# Patient Record
Sex: Male | Born: 1935 | Race: White | Hispanic: No | Marital: Single | Smoking: Former smoker
Health system: Southern US, Community
[De-identification: ages and names within clinical notes are randomized; demographics above are authoritative.]

## PROBLEM LIST (undated history)

## (undated) DIAGNOSIS — T4145XA Adverse effect of unspecified anesthetic, initial encounter: Secondary | ICD-10-CM

## (undated) DIAGNOSIS — K859 Acute pancreatitis without necrosis or infection, unspecified: Secondary | ICD-10-CM

## (undated) DIAGNOSIS — G629 Polyneuropathy, unspecified: Secondary | ICD-10-CM

## (undated) DIAGNOSIS — I10 Essential (primary) hypertension: Secondary | ICD-10-CM

## (undated) DIAGNOSIS — I714 Abdominal aortic aneurysm, without rupture, unspecified: Secondary | ICD-10-CM

## (undated) DIAGNOSIS — M1990 Unspecified osteoarthritis, unspecified site: Secondary | ICD-10-CM

## (undated) DIAGNOSIS — N289 Disorder of kidney and ureter, unspecified: Secondary | ICD-10-CM

## (undated) DIAGNOSIS — E039 Hypothyroidism, unspecified: Secondary | ICD-10-CM

## (undated) DIAGNOSIS — N400 Enlarged prostate without lower urinary tract symptoms: Secondary | ICD-10-CM

## (undated) DIAGNOSIS — R7302 Impaired glucose tolerance (oral): Secondary | ICD-10-CM

## (undated) DIAGNOSIS — T8859XA Other complications of anesthesia, initial encounter: Secondary | ICD-10-CM

## (undated) HISTORY — DX: Impaired glucose tolerance (oral): R73.02

## (undated) HISTORY — DX: Hypothyroidism, unspecified: E03.9

## (undated) HISTORY — DX: Disorder of kidney and ureter, unspecified: N28.9

## (undated) HISTORY — DX: Benign prostatic hyperplasia without lower urinary tract symptoms: N40.0

## (undated) HISTORY — DX: Abdominal aortic aneurysm, without rupture: I71.4

## (undated) HISTORY — PX: PROSTATE SURGERY: SHX751

## (undated) HISTORY — DX: Abdominal aortic aneurysm, without rupture, unspecified: I71.40

---

## 2003-01-28 ENCOUNTER — Encounter: Admission: RE | Admit: 2003-01-28 | Discharge: 2003-04-28 | Payer: Self-pay | Admitting: Podiatry

## 2003-04-24 ENCOUNTER — Emergency Department (HOSPITAL_COMMUNITY): Admission: EM | Admit: 2003-04-24 | Discharge: 2003-04-24 | Payer: Self-pay | Admitting: Emergency Medicine

## 2003-04-24 ENCOUNTER — Encounter: Payer: Self-pay | Admitting: Emergency Medicine

## 2007-03-27 ENCOUNTER — Emergency Department (HOSPITAL_COMMUNITY): Admission: EM | Admit: 2007-03-27 | Discharge: 2007-03-27 | Payer: Self-pay | Admitting: Emergency Medicine

## 2007-04-17 ENCOUNTER — Inpatient Hospital Stay (HOSPITAL_COMMUNITY): Admission: EM | Admit: 2007-04-17 | Discharge: 2007-04-20 | Payer: Self-pay | Admitting: Emergency Medicine

## 2007-06-28 ENCOUNTER — Ambulatory Visit: Payer: Self-pay | Admitting: *Deleted

## 2007-07-12 ENCOUNTER — Encounter: Admission: RE | Admit: 2007-07-12 | Discharge: 2007-07-12 | Payer: Self-pay | Admitting: Gastroenterology

## 2007-08-26 ENCOUNTER — Emergency Department (HOSPITAL_COMMUNITY): Admission: EM | Admit: 2007-08-26 | Discharge: 2007-08-26 | Payer: Self-pay | Admitting: Emergency Medicine

## 2007-09-01 ENCOUNTER — Emergency Department (HOSPITAL_COMMUNITY): Admission: EM | Admit: 2007-09-01 | Discharge: 2007-09-01 | Payer: Self-pay | Admitting: Emergency Medicine

## 2007-09-28 ENCOUNTER — Ambulatory Visit: Payer: Self-pay | Admitting: Family Medicine

## 2007-10-15 ENCOUNTER — Ambulatory Visit: Payer: Self-pay | Admitting: Family Medicine

## 2007-12-13 ENCOUNTER — Ambulatory Visit: Payer: Self-pay | Admitting: Family Medicine

## 2007-12-14 ENCOUNTER — Encounter: Admission: RE | Admit: 2007-12-14 | Discharge: 2007-12-14 | Payer: Self-pay | Admitting: Family Medicine

## 2008-01-11 ENCOUNTER — Ambulatory Visit: Payer: Self-pay | Admitting: Family Medicine

## 2008-02-21 ENCOUNTER — Ambulatory Visit: Payer: Self-pay | Admitting: Family Medicine

## 2008-02-28 ENCOUNTER — Ambulatory Visit: Payer: Self-pay | Admitting: *Deleted

## 2008-03-20 ENCOUNTER — Ambulatory Visit: Payer: Self-pay | Admitting: Family Medicine

## 2008-05-07 ENCOUNTER — Ambulatory Visit: Payer: Self-pay | Admitting: Family Medicine

## 2008-06-05 ENCOUNTER — Emergency Department (HOSPITAL_COMMUNITY): Admission: EM | Admit: 2008-06-05 | Discharge: 2008-06-05 | Payer: Self-pay | Admitting: Emergency Medicine

## 2008-08-04 ENCOUNTER — Ambulatory Visit: Payer: Self-pay | Admitting: Family Medicine

## 2008-08-05 ENCOUNTER — Ambulatory Visit: Payer: Self-pay | Admitting: *Deleted

## 2008-08-05 ENCOUNTER — Encounter: Admission: RE | Admit: 2008-08-05 | Discharge: 2008-08-05 | Payer: Self-pay | Admitting: Family Medicine

## 2008-08-07 ENCOUNTER — Ambulatory Visit: Payer: Self-pay | Admitting: *Deleted

## 2008-10-03 ENCOUNTER — Ambulatory Visit: Payer: Self-pay | Admitting: Family Medicine

## 2008-10-06 ENCOUNTER — Ambulatory Visit: Payer: Self-pay | Admitting: Family Medicine

## 2008-10-10 ENCOUNTER — Ambulatory Visit: Payer: Self-pay | Admitting: Family Medicine

## 2009-02-05 ENCOUNTER — Encounter: Admission: RE | Admit: 2009-02-05 | Discharge: 2009-02-05 | Payer: Self-pay | Admitting: *Deleted

## 2009-03-12 ENCOUNTER — Ambulatory Visit: Payer: Self-pay | Admitting: *Deleted

## 2009-03-26 ENCOUNTER — Ambulatory Visit: Payer: Self-pay | Admitting: *Deleted

## 2009-08-03 ENCOUNTER — Ambulatory Visit: Payer: Self-pay | Admitting: Surgery

## 2009-10-07 ENCOUNTER — Ambulatory Visit: Payer: Self-pay | Admitting: Family Medicine

## 2009-10-22 ENCOUNTER — Ambulatory Visit: Payer: Self-pay | Admitting: Family Medicine

## 2009-10-27 ENCOUNTER — Ambulatory Visit: Payer: Self-pay | Admitting: Family Medicine

## 2009-10-30 ENCOUNTER — Ambulatory Visit (HOSPITAL_COMMUNITY): Admission: RE | Admit: 2009-10-30 | Discharge: 2009-10-30 | Payer: Self-pay | Admitting: Family Medicine

## 2009-11-06 ENCOUNTER — Ambulatory Visit: Payer: Self-pay | Admitting: Family Medicine

## 2010-03-24 ENCOUNTER — Emergency Department (HOSPITAL_COMMUNITY): Admission: EM | Admit: 2010-03-24 | Discharge: 2010-03-24 | Payer: Self-pay | Admitting: Emergency Medicine

## 2010-03-24 ENCOUNTER — Ambulatory Visit: Payer: Self-pay | Admitting: Vascular Surgery

## 2010-04-26 ENCOUNTER — Ambulatory Visit: Payer: Self-pay | Admitting: Family Medicine

## 2010-05-10 ENCOUNTER — Ambulatory Visit: Payer: Self-pay | Admitting: Surgery

## 2010-05-10 ENCOUNTER — Ambulatory Visit: Payer: Self-pay | Admitting: Family Medicine

## 2010-05-19 ENCOUNTER — Ambulatory Visit: Payer: Self-pay | Admitting: Surgery

## 2010-05-19 ENCOUNTER — Encounter: Payer: Self-pay | Admitting: Surgery

## 2010-05-19 ENCOUNTER — Ambulatory Visit: Payer: Self-pay | Admitting: Pulmonary Disease

## 2010-05-19 ENCOUNTER — Inpatient Hospital Stay (HOSPITAL_COMMUNITY): Admission: RE | Admit: 2010-05-19 | Discharge: 2010-06-09 | Payer: Self-pay | Admitting: Surgery

## 2010-05-19 ENCOUNTER — Ambulatory Visit: Payer: Self-pay | Admitting: Cardiology

## 2010-05-21 HISTORY — PX: ABDOMINAL AORTIC ANEURYSM REPAIR: SUR1152

## 2010-05-25 ENCOUNTER — Encounter: Payer: Self-pay | Admitting: Pulmonary Disease

## 2010-06-09 ENCOUNTER — Encounter: Payer: Self-pay | Admitting: Surgery

## 2010-06-24 ENCOUNTER — Ambulatory Visit: Payer: Self-pay | Admitting: Family Medicine

## 2010-06-25 ENCOUNTER — Ambulatory Visit: Payer: Self-pay | Admitting: Family Medicine

## 2010-06-28 ENCOUNTER — Ambulatory Visit: Payer: Self-pay | Admitting: Surgery

## 2010-09-27 ENCOUNTER — Ambulatory Visit: Payer: Self-pay | Admitting: Surgery

## 2010-10-07 ENCOUNTER — Ambulatory Visit: Payer: Self-pay | Admitting: Family Medicine

## 2010-12-12 ENCOUNTER — Encounter: Payer: Self-pay | Admitting: Internal Medicine

## 2011-02-05 LAB — BASIC METABOLIC PANEL
BUN: 19 mg/dL (ref 6–23)
BUN: 25 mg/dL — ABNORMAL HIGH (ref 6–23)
BUN: 25 mg/dL — ABNORMAL HIGH (ref 6–23)
Calcium: 7.2 mg/dL — ABNORMAL LOW (ref 8.4–10.5)
Calcium: 7.3 mg/dL — ABNORMAL LOW (ref 8.4–10.5)
Creatinine, Ser: 1.61 mg/dL — ABNORMAL HIGH (ref 0.4–1.5)
GFR calc non Af Amer: 37 mL/min — ABNORMAL LOW (ref >60)
GFR calc non Af Amer: 38 mL/min — ABNORMAL LOW (ref >60)
GFR calc non Af Amer: 42 mL/min — ABNORMAL LOW (ref >60)
Glucose, Bld: 105 mg/dL — ABNORMAL HIGH (ref 70–99)
Glucose, Bld: 135 mg/dL — ABNORMAL HIGH (ref 70–99)
Potassium: 3.7 mEq/L (ref 3.5–5.1)
Potassium: 4.6 mEq/L (ref 3.5–5.1)

## 2011-02-05 LAB — GLUCOSE, CAPILLARY
Glucose-Capillary: 108 mg/dL — ABNORMAL HIGH (ref 70–99)
Glucose-Capillary: 118 mg/dL — ABNORMAL HIGH (ref 70–99)
Glucose-Capillary: 126 mg/dL — ABNORMAL HIGH (ref 70–99)
Glucose-Capillary: 134 mg/dL — ABNORMAL HIGH (ref 70–99)
Glucose-Capillary: 148 mg/dL — ABNORMAL HIGH (ref 70–99)
Glucose-Capillary: 174 mg/dL — ABNORMAL HIGH (ref 70–99)
Glucose-Capillary: 180 mg/dL — ABNORMAL HIGH (ref 70–99)
Glucose-Capillary: 223 mg/dL — ABNORMAL HIGH (ref 70–99)
Glucose-Capillary: 235 mg/dL — ABNORMAL HIGH (ref 70–99)
Glucose-Capillary: 262 mg/dL — ABNORMAL HIGH (ref 70–99)

## 2011-02-05 LAB — CBC
HCT: 30.7 % — ABNORMAL LOW (ref 39.0–52.0)
Hemoglobin: 10.1 g/dL — ABNORMAL LOW (ref 13.0–17.0)
MCH: 31.2 pg (ref 26.0–34.0)
MCHC: 33.1 g/dL (ref 30.0–36.0)
RDW: 15.5 % (ref 11.5–15.5)
WBC: 7.5 10*3/uL (ref 4.0–10.5)

## 2011-02-05 LAB — PHOSPHORUS: Phosphorus: 3.5 mg/dL (ref 2.3–4.6)

## 2011-02-05 LAB — HEMOGLOBIN A1C: Hgb A1c MFr Bld: 6.3 % — ABNORMAL HIGH (ref <5.7)

## 2011-02-05 LAB — CARDIAC PANEL(CRET KIN+CKTOT+MB+TROPI)
CK, MB: 2.1 ng/mL (ref 0.3–4.0)
CK, MB: 2.3 ng/mL (ref 0.3–4.0)
Troponin I: 0.02 ng/mL (ref 0.00–0.06)

## 2011-02-06 LAB — POCT I-STAT 3, ART BLOOD GAS (G3+)
Acid-base deficit: 5 mmol/L — ABNORMAL HIGH (ref 0.0–2.0)
Acid-base deficit: 6 mmol/L — ABNORMAL HIGH (ref 0.0–2.0)
Bicarbonate: 16.8 mEq/L — ABNORMAL LOW (ref 20.0–24.0)
Bicarbonate: 17.8 mEq/L — ABNORMAL LOW (ref 20.0–24.0)
Bicarbonate: 18.9 mEq/L — ABNORMAL LOW (ref 20.0–24.0)
Bicarbonate: 20.9 mEq/L (ref 20.0–24.0)
O2 Saturation: 93 %
O2 Saturation: 94 %
O2 Saturation: 96 %
Patient temperature: 97.5
Patient temperature: 98.2
Patient temperature: 98.6
TCO2: 19 mmol/L (ref 0–100)
TCO2: 20 mmol/L (ref 0–100)
TCO2: 22 mmol/L (ref 0–100)
pCO2 arterial: 34.4 mmHg — ABNORMAL LOW (ref 35.0–45.0)
pCO2 arterial: 39.6 mmHg (ref 35.0–45.0)
pCO2 arterial: 41.4 mmHg (ref 35.0–45.0)
pCO2 arterial: 44.2 mmHg (ref 35.0–45.0)
pH, Arterial: 7.241 — ABNORMAL LOW (ref 7.350–7.450)
pH, Arterial: 7.286 — ABNORMAL LOW (ref 7.350–7.450)
pH, Arterial: 7.316 — ABNORMAL LOW (ref 7.350–7.450)
pH, Arterial: 7.367 (ref 7.350–7.450)
pO2, Arterial: 126 mmHg — ABNORMAL HIGH (ref 80.0–100.0)
pO2, Arterial: 65 mmHg — ABNORMAL LOW (ref 80.0–100.0)
pO2, Arterial: 73 mmHg — ABNORMAL LOW (ref 80.0–100.0)

## 2011-02-06 LAB — COMPREHENSIVE METABOLIC PANEL
ALT: 17 U/L (ref 0–53)
ALT: 18 U/L (ref 0–53)
ALT: 22 U/L (ref 0–53)
ALT: 26 U/L (ref 0–53)
AST: 14 U/L (ref 0–37)
AST: 15 U/L (ref 0–37)
AST: 17 U/L (ref 0–37)
AST: 24 U/L (ref 0–37)
AST: 25 U/L (ref 0–37)
AST: 15 U/L (ref 0–37)
Albumin: 1.9 g/dL — ABNORMAL LOW (ref 3.5–5.2)
Albumin: 2.2 g/dL — ABNORMAL LOW (ref 3.5–5.2)
Albumin: 2.2 g/dL — ABNORMAL LOW (ref 3.5–5.2)
Albumin: 3.3 g/dL — ABNORMAL LOW (ref 3.5–5.2)
Alkaline Phosphatase: 51 U/L (ref 39–117)
Alkaline Phosphatase: 52 U/L (ref 39–117)
Alkaline Phosphatase: 58 U/L (ref 39–117)
Alkaline Phosphatase: 63 U/L (ref 39–117)
BUN: 21 mg/dL (ref 6–23)
BUN: 29 mg/dL — ABNORMAL HIGH (ref 6–23)
CO2: 18 mEq/L — ABNORMAL LOW (ref 19–32)
CO2: 39 mEq/L — ABNORMAL HIGH (ref 19–32)
Calcium: 7 mg/dL — ABNORMAL LOW (ref 8.4–10.5)
Calcium: 7 mg/dL — ABNORMAL LOW (ref 8.4–10.5)
Calcium: 7.2 mg/dL — ABNORMAL LOW (ref 8.4–10.5)
Calcium: 7.3 mg/dL — ABNORMAL LOW (ref 8.4–10.5)
Calcium: 8.3 mg/dL — ABNORMAL LOW (ref 8.4–10.5)
Chloride: 104 mEq/L (ref 96–112)
Chloride: 113 mEq/L — ABNORMAL HIGH (ref 96–112)
Creatinine, Ser: 1.52 mg/dL — ABNORMAL HIGH (ref 0.4–1.5)
Creatinine, Ser: 1.52 mg/dL — ABNORMAL HIGH (ref 0.4–1.5)
Creatinine, Ser: 1.87 mg/dL — ABNORMAL HIGH (ref 0.4–1.5)
Creatinine, Ser: 1.56 mg/dL — ABNORMAL HIGH (ref 0.4–1.5)
GFR calc Af Amer: 43 mL/min — ABNORMAL LOW (ref >60)
GFR calc Af Amer: 43 mL/min — ABNORMAL LOW (ref >60)
GFR calc Af Amer: 54 mL/min — ABNORMAL LOW (ref >60)
GFR calc Af Amer: 54 mL/min — ABNORMAL LOW (ref >60)
GFR calc Af Amer: 60 mL/min — ABNORMAL LOW (ref >60)
GFR calc Af Amer: 52 mL/min — ABNORMAL LOW (ref >60)
GFR calc non Af Amer: 36 mL/min — ABNORMAL LOW (ref >60)
GFR calc non Af Amer: 45 mL/min — ABNORMAL LOW (ref >60)
Glucose, Bld: 142 mg/dL — ABNORMAL HIGH (ref 70–99)
Potassium: 3 mEq/L — ABNORMAL LOW (ref 3.5–5.1)
Potassium: 3.8 mEq/L (ref 3.5–5.1)
Potassium: 3.8 mEq/L (ref 3.5–5.1)
Sodium: 142 mEq/L (ref 135–145)
Sodium: 144 mEq/L (ref 135–145)
Sodium: 145 mEq/L (ref 135–145)
Sodium: 149 mEq/L — ABNORMAL HIGH (ref 135–145)
Total Bilirubin: 0.5 mg/dL (ref 0.3–1.2)
Total Bilirubin: 1 mg/dL (ref 0.3–1.2)
Total Protein: 4.7 g/dL — ABNORMAL LOW (ref 6.0–8.3)
Total Protein: 4.7 g/dL — ABNORMAL LOW (ref 6.0–8.3)
Total Protein: 4.7 g/dL — ABNORMAL LOW (ref 6.0–8.3)
Total Protein: 4.9 g/dL — ABNORMAL LOW (ref 6.0–8.3)
Total Protein: 5.1 g/dL — ABNORMAL LOW (ref 6.0–8.3)

## 2011-02-06 LAB — POCT I-STAT 7, (LYTES, BLD GAS, ICA,H+H)
Acid-base deficit: 10 mmol/L — ABNORMAL HIGH (ref 0.0–2.0)
Acid-base deficit: 5 mmol/L — ABNORMAL HIGH (ref 0.0–2.0)
Calcium, Ion: 0.95 mmol/L — ABNORMAL LOW (ref 1.12–1.32)
Calcium, Ion: 1.04 mmol/L — ABNORMAL LOW (ref 1.12–1.32)
HCT: 26 % — ABNORMAL LOW (ref 39.0–52.0)
HCT: 34 % — ABNORMAL LOW (ref 39.0–52.0)
Hemoglobin: 11.6 g/dL — ABNORMAL LOW (ref 13.0–17.0)
Hemoglobin: 8.8 g/dL — ABNORMAL LOW (ref 13.0–17.0)
O2 Saturation: 100 %
O2 Saturation: 100 %
Patient temperature: 34.8
Patient temperature: 34.9
Patient temperature: 35.1
Potassium: 4.1 mEq/L (ref 3.5–5.1)
Potassium: 4.1 mEq/L (ref 3.5–5.1)
Potassium: 4.1 mEq/L (ref 3.5–5.1)
Sodium: 138 mEq/L (ref 135–145)
TCO2: 19 mmol/L (ref 0–100)
TCO2: 19 mmol/L (ref 0–100)
TCO2: 23 mmol/L (ref 0–100)
pCO2 arterial: 35.8 mmHg (ref 35.0–45.0)
pCO2 arterial: 39 mmHg (ref 35.0–45.0)
pCO2 arterial: 40.8 mmHg (ref 35.0–45.0)
pH, Arterial: 7.246 — ABNORMAL LOW (ref 7.350–7.450)
pO2, Arterial: 443 mmHg — ABNORMAL HIGH (ref 80.0–100.0)
pO2, Arterial: 449 mmHg — ABNORMAL HIGH (ref 80.0–100.0)

## 2011-02-06 LAB — BLOOD GAS, ARTERIAL
Acid-base deficit: 8.4 mmol/L — ABNORMAL HIGH (ref 0.0–2.0)
Bicarbonate: 20.1 mEq/L (ref 20.0–24.0)
Bicarbonate: 17.6 mEq/L — ABNORMAL LOW (ref 20.0–24.0)
Drawn by: 181601
MECHVT: 550 mL
O2 Saturation: 99.6 %
O2 Saturation: 98.3 %
PEEP: 5 cmH2O
Patient temperature: 98.6
Patient temperature: 98.7
Patient temperature: 98.6
TCO2: 18.8 mmol/L (ref 0–100)
pCO2 arterial: 40.4 mmHg (ref 35.0–45.0)
pH, Arterial: 7.37 (ref 7.350–7.450)
pH, Arterial: 7.392 (ref 7.350–7.450)
pO2, Arterial: 66.5 mmHg — ABNORMAL LOW (ref 80.0–100.0)
pO2, Arterial: 112 mmHg — ABNORMAL HIGH (ref 80.0–100.0)

## 2011-02-06 LAB — POCT I-STAT 4, (NA,K, GLUC, HGB,HCT)
Glucose, Bld: 98 mg/dL (ref 70–99)
HCT: 36 % — ABNORMAL LOW (ref 39.0–52.0)
Potassium: 2.7 mEq/L — CL (ref 3.5–5.1)
Sodium: 150 mEq/L — ABNORMAL HIGH (ref 135–145)

## 2011-02-06 LAB — CBC
HCT: 29.4 % — ABNORMAL LOW (ref 39.0–52.0)
HCT: 30.7 % — ABNORMAL LOW (ref 39.0–52.0)
HCT: 32.7 % — ABNORMAL LOW (ref 39.0–52.0)
HCT: 33.6 % — ABNORMAL LOW (ref 39.0–52.0)
HCT: 34 % — ABNORMAL LOW (ref 39.0–52.0)
HCT: 34.8 % — ABNORMAL LOW (ref 39.0–52.0)
HCT: 35.5 % — ABNORMAL LOW (ref 39.0–52.0)
HCT: 32.7 % — ABNORMAL LOW (ref 39.0–52.0)
Hemoglobin: 10.5 g/dL — ABNORMAL LOW (ref 13.0–17.0)
Hemoglobin: 10.6 g/dL — ABNORMAL LOW (ref 13.0–17.0)
Hemoglobin: 10.7 g/dL — ABNORMAL LOW (ref 13.0–17.0)
Hemoglobin: 11.3 g/dL — ABNORMAL LOW (ref 13.0–17.0)
Hemoglobin: 11.4 g/dL — ABNORMAL LOW (ref 13.0–17.0)
Hemoglobin: 11.4 g/dL — ABNORMAL LOW (ref 13.0–17.0)
Hemoglobin: 12 g/dL — ABNORMAL LOW (ref 13.0–17.0)
Hemoglobin: 11.3 g/dL — ABNORMAL LOW (ref 13.0–17.0)
MCH: 31.1 pg (ref 26.0–34.0)
MCH: 31.2 pg (ref 26.0–34.0)
MCH: 31.3 pg (ref 26.0–34.0)
MCH: 31.7 pg (ref 26.0–34.0)
MCH: 31.7 pg (ref 26.0–34.0)
MCH: 32.1 pg (ref 26.0–34.0)
MCH: 32.6 pg (ref 26.0–34.0)
MCH: 32.9 pg (ref 26.0–34.0)
MCHC: 32.6 g/dL (ref 30.0–36.0)
MCHC: 32.7 g/dL (ref 30.0–36.0)
MCHC: 32.8 g/dL (ref 30.0–36.0)
MCHC: 33 g/dL (ref 30.0–36.0)
MCHC: 33.2 g/dL (ref 30.0–36.0)
MCHC: 33.3 g/dL (ref 30.0–36.0)
MCHC: 33.4 g/dL (ref 30.0–36.0)
MCHC: 33.7 g/dL (ref 30.0–36.0)
MCHC: 34 g/dL (ref 30.0–36.0)
MCHC: 33.9 g/dL (ref 30.0–36.0)
MCHC: 34.4 g/dL (ref 30.0–36.0)
MCHC: 34.6 g/dL (ref 30.0–36.0)
MCV: 93.4 fL (ref 78.0–100.0)
MCV: 94.6 fL (ref 78.0–100.0)
MCV: 94.8 fL (ref 78.0–100.0)
MCV: 94.8 fL (ref 78.0–100.0)
MCV: 94.9 fL (ref 78.0–100.0)
MCV: 95.1 fL (ref 78.0–100.0)
MCV: 94.2 fL (ref 78.0–100.0)
Platelets: 189 10*3/uL (ref 150–400)
Platelets: 266 10*3/uL (ref 150–400)
Platelets: 325 10*3/uL (ref 150–400)
Platelets: 400 10*3/uL (ref 150–400)
Platelets: 121 10*3/uL — ABNORMAL LOW (ref 150–400)
Platelets: 191 10*3/uL (ref 150–400)
RBC: 3.09 MIL/uL — ABNORMAL LOW (ref 4.22–5.81)
RBC: 3.49 MIL/uL — ABNORMAL LOW (ref 4.22–5.81)
RBC: 3.58 MIL/uL — ABNORMAL LOW (ref 4.22–5.81)
RBC: 3.64 MIL/uL — ABNORMAL LOW (ref 4.22–5.81)
RBC: 3.47 MIL/uL — ABNORMAL LOW (ref 4.22–5.81)
RDW: 14.8 % (ref 11.5–15.5)
RDW: 15 % (ref 11.5–15.5)
RDW: 15.1 % (ref 11.5–15.5)
RDW: 15.3 % (ref 11.5–15.5)
RDW: 15.3 % (ref 11.5–15.5)
RDW: 15.4 % (ref 11.5–15.5)
RDW: 15.5 % (ref 11.5–15.5)
RDW: 15.6 % — ABNORMAL HIGH (ref 11.5–15.5)
RDW: 15.7 % — ABNORMAL HIGH (ref 11.5–15.5)
RDW: 16.1 % — ABNORMAL HIGH (ref 11.5–15.5)
RDW: 15.8 % — ABNORMAL HIGH (ref 11.5–15.5)
WBC: 10.3 10*3/uL (ref 4.0–10.5)
WBC: 10.6 10*3/uL — ABNORMAL HIGH (ref 4.0–10.5)
WBC: 11.5 10*3/uL — ABNORMAL HIGH (ref 4.0–10.5)
WBC: 13.4 10*3/uL — ABNORMAL HIGH (ref 4.0–10.5)
WBC: 16.9 10*3/uL — ABNORMAL HIGH (ref 4.0–10.5)
WBC: 9.9 10*3/uL (ref 4.0–10.5)
WBC: 12.3 10*3/uL — ABNORMAL HIGH (ref 4.0–10.5)

## 2011-02-06 LAB — URINALYSIS, ROUTINE W REFLEX MICROSCOPIC
Bilirubin Urine: NEGATIVE
Bilirubin Urine: NEGATIVE
Leukocytes, UA: NEGATIVE
Nitrite: NEGATIVE
Protein, ur: 30 mg/dL — AB
Specific Gravity, Urine: 1.025 (ref 1.005–1.030)
Urobilinogen, UA: 0.2 mg/dL (ref 0.0–1.0)

## 2011-02-06 LAB — BASIC METABOLIC PANEL
BUN: 25 mg/dL — ABNORMAL HIGH (ref 6–23)
BUN: 25 mg/dL — ABNORMAL HIGH (ref 6–23)
BUN: 28 mg/dL — ABNORMAL HIGH (ref 6–23)
BUN: 38 mg/dL — ABNORMAL HIGH (ref 6–23)
BUN: 39 mg/dL — ABNORMAL HIGH (ref 6–23)
CO2: 18 mEq/L — ABNORMAL LOW (ref 19–32)
CO2: 22 mEq/L (ref 19–32)
CO2: 33 mEq/L — ABNORMAL HIGH (ref 19–32)
CO2: 36 mEq/L — ABNORMAL HIGH (ref 19–32)
CO2: 20 mEq/L (ref 19–32)
Calcium: 6.6 mg/dL — ABNORMAL LOW (ref 8.4–10.5)
Calcium: 7.5 mg/dL — ABNORMAL LOW (ref 8.4–10.5)
Calcium: 7.5 mg/dL — ABNORMAL LOW (ref 8.4–10.5)
Calcium: 7.6 mg/dL — ABNORMAL LOW (ref 8.4–10.5)
Calcium: 6.4 mg/dL — CL (ref 8.4–10.5)
Chloride: 101 mEq/L (ref 96–112)
Chloride: 103 mEq/L (ref 96–112)
Chloride: 111 mEq/L (ref 96–112)
Chloride: 112 mEq/L (ref 96–112)
Chloride: 114 mEq/L — ABNORMAL HIGH (ref 96–112)
Chloride: 115 mEq/L — ABNORMAL HIGH (ref 96–112)
Creatinine, Ser: 1.59 mg/dL — ABNORMAL HIGH (ref 0.4–1.5)
Creatinine, Ser: 1.64 mg/dL — ABNORMAL HIGH (ref 0.4–1.5)
Creatinine, Ser: 1.82 mg/dL — ABNORMAL HIGH (ref 0.4–1.5)
Creatinine, Ser: 1.92 mg/dL — ABNORMAL HIGH (ref 0.4–1.5)
Creatinine, Ser: 2.05 mg/dL — ABNORMAL HIGH (ref 0.4–1.5)
Creatinine, Ser: 2.1 mg/dL — ABNORMAL HIGH (ref 0.4–1.5)
GFR calc Af Amer: 40 mL/min — ABNORMAL LOW (ref >60)
GFR calc Af Amer: 40 mL/min — ABNORMAL LOW (ref >60)
GFR calc Af Amer: 50 mL/min — ABNORMAL LOW (ref >60)
GFR calc Af Amer: 51 mL/min — ABNORMAL LOW (ref >60)
GFR calc Af Amer: 52 mL/min — ABNORMAL LOW (ref >60)
GFR calc Af Amer: 57 mL/min — ABNORMAL LOW (ref >60)
GFR calc non Af Amer: 31 mL/min — ABNORMAL LOW (ref >60)
GFR calc non Af Amer: 32 mL/min — ABNORMAL LOW (ref >60)
GFR calc non Af Amer: 34 mL/min — ABNORMAL LOW (ref >60)
GFR calc non Af Amer: 36 mL/min — ABNORMAL LOW (ref >60)
GFR calc non Af Amer: 40 mL/min — ABNORMAL LOW (ref >60)
GFR calc non Af Amer: 43 mL/min — ABNORMAL LOW (ref >60)
Glucose, Bld: 119 mg/dL — ABNORMAL HIGH (ref 70–99)
Glucose, Bld: 134 mg/dL — ABNORMAL HIGH (ref 70–99)
Glucose, Bld: 134 mg/dL — ABNORMAL HIGH (ref 70–99)
Glucose, Bld: 144 mg/dL — ABNORMAL HIGH (ref 70–99)
Glucose, Bld: 156 mg/dL — ABNORMAL HIGH (ref 70–99)
Glucose, Bld: 194 mg/dL — ABNORMAL HIGH (ref 70–99)
Glucose, Bld: 211 mg/dL — ABNORMAL HIGH (ref 70–99)
Potassium: 2.2 mEq/L — CL (ref 3.5–5.1)
Potassium: 2.5 mEq/L — CL (ref 3.5–5.1)
Potassium: 2.8 mEq/L — ABNORMAL LOW (ref 3.5–5.1)
Potassium: 5.2 mEq/L — ABNORMAL HIGH (ref 3.5–5.1)
Sodium: 138 mEq/L (ref 135–145)
Sodium: 139 mEq/L (ref 135–145)
Sodium: 140 mEq/L (ref 135–145)
Sodium: 141 mEq/L (ref 135–145)
Sodium: 148 mEq/L — ABNORMAL HIGH (ref 135–145)
Sodium: 139 mEq/L (ref 135–145)

## 2011-02-06 LAB — GLUCOSE, CAPILLARY
Glucose-Capillary: 100 mg/dL — ABNORMAL HIGH (ref 70–99)
Glucose-Capillary: 102 mg/dL — ABNORMAL HIGH (ref 70–99)
Glucose-Capillary: 102 mg/dL — ABNORMAL HIGH (ref 70–99)
Glucose-Capillary: 105 mg/dL — ABNORMAL HIGH (ref 70–99)
Glucose-Capillary: 108 mg/dL — ABNORMAL HIGH (ref 70–99)
Glucose-Capillary: 111 mg/dL — ABNORMAL HIGH (ref 70–99)
Glucose-Capillary: 115 mg/dL — ABNORMAL HIGH (ref 70–99)
Glucose-Capillary: 116 mg/dL — ABNORMAL HIGH (ref 70–99)
Glucose-Capillary: 117 mg/dL — ABNORMAL HIGH (ref 70–99)
Glucose-Capillary: 120 mg/dL — ABNORMAL HIGH (ref 70–99)
Glucose-Capillary: 122 mg/dL — ABNORMAL HIGH (ref 70–99)
Glucose-Capillary: 123 mg/dL — ABNORMAL HIGH (ref 70–99)
Glucose-Capillary: 124 mg/dL — ABNORMAL HIGH (ref 70–99)
Glucose-Capillary: 126 mg/dL — ABNORMAL HIGH (ref 70–99)
Glucose-Capillary: 132 mg/dL — ABNORMAL HIGH (ref 70–99)
Glucose-Capillary: 133 mg/dL — ABNORMAL HIGH (ref 70–99)
Glucose-Capillary: 142 mg/dL — ABNORMAL HIGH (ref 70–99)
Glucose-Capillary: 144 mg/dL — ABNORMAL HIGH (ref 70–99)
Glucose-Capillary: 145 mg/dL — ABNORMAL HIGH (ref 70–99)
Glucose-Capillary: 146 mg/dL — ABNORMAL HIGH (ref 70–99)
Glucose-Capillary: 149 mg/dL — ABNORMAL HIGH (ref 70–99)
Glucose-Capillary: 151 mg/dL — ABNORMAL HIGH (ref 70–99)
Glucose-Capillary: 151 mg/dL — ABNORMAL HIGH (ref 70–99)
Glucose-Capillary: 152 mg/dL — ABNORMAL HIGH (ref 70–99)
Glucose-Capillary: 156 mg/dL — ABNORMAL HIGH (ref 70–99)
Glucose-Capillary: 156 mg/dL — ABNORMAL HIGH (ref 70–99)
Glucose-Capillary: 161 mg/dL — ABNORMAL HIGH (ref 70–99)
Glucose-Capillary: 165 mg/dL — ABNORMAL HIGH (ref 70–99)
Glucose-Capillary: 166 mg/dL — ABNORMAL HIGH (ref 70–99)
Glucose-Capillary: 178 mg/dL — ABNORMAL HIGH (ref 70–99)
Glucose-Capillary: 190 mg/dL — ABNORMAL HIGH (ref 70–99)
Glucose-Capillary: 191 mg/dL — ABNORMAL HIGH (ref 70–99)
Glucose-Capillary: 203 mg/dL — ABNORMAL HIGH (ref 70–99)
Glucose-Capillary: 213 mg/dL — ABNORMAL HIGH (ref 70–99)
Glucose-Capillary: 244 mg/dL — ABNORMAL HIGH (ref 70–99)
Glucose-Capillary: 88 mg/dL (ref 70–99)
Glucose-Capillary: 98 mg/dL (ref 70–99)
Glucose-Capillary: 111 mg/dL — ABNORMAL HIGH (ref 70–99)
Glucose-Capillary: 115 mg/dL — ABNORMAL HIGH (ref 70–99)
Glucose-Capillary: 120 mg/dL — ABNORMAL HIGH (ref 70–99)
Glucose-Capillary: 129 mg/dL — ABNORMAL HIGH (ref 70–99)
Glucose-Capillary: 140 mg/dL — ABNORMAL HIGH (ref 70–99)
Glucose-Capillary: 158 mg/dL — ABNORMAL HIGH (ref 70–99)
Glucose-Capillary: 170 mg/dL — ABNORMAL HIGH (ref 70–99)

## 2011-02-06 LAB — POTASSIUM
Potassium: 2.4 mEq/L — CL (ref 3.5–5.1)
Potassium: 2.9 mEq/L — ABNORMAL LOW (ref 3.5–5.1)

## 2011-02-06 LAB — CULTURE, BAL-QUANTITATIVE W GRAM STAIN

## 2011-02-06 LAB — CLOSTRIDIUM DIFFICILE EIA: C difficile Toxins A+B, EIA: NEGATIVE

## 2011-02-06 LAB — DIFFERENTIAL
Basophils Relative: 1 % (ref 0–1)
Lymphs Abs: 0.7 10*3/uL (ref 0.7–4.0)
Lymphs Abs: 1.2 10*3/uL (ref 0.7–4.0)
Monocytes Relative: 10 % (ref 3–12)
Monocytes Relative: 5 % (ref 3–12)
Neutro Abs: 11.5 10*3/uL — ABNORMAL HIGH (ref 1.7–7.7)
Neutro Abs: 9.5 10*3/uL — ABNORMAL HIGH (ref 1.7–7.7)
Neutrophils Relative %: 82 % — ABNORMAL HIGH (ref 43–77)
Neutrophils Relative %: 84 % — ABNORMAL HIGH (ref 43–77)

## 2011-02-06 LAB — CULTURE, BLOOD (ROUTINE X 2): Culture: NO GROWTH

## 2011-02-06 LAB — URINE MICROSCOPIC-ADD ON

## 2011-02-06 LAB — URINE CULTURE
Colony Count: NO GROWTH
Culture: NO GROWTH

## 2011-02-06 LAB — CARDIAC PANEL(CRET KIN+CKTOT+MB+TROPI)
CK, MB: 6.3 ng/mL (ref 0.3–4.0)
CK, MB: 8.7 ng/mL (ref 0.3–4.0)
Relative Index: 6.6 — ABNORMAL HIGH (ref 0.0–2.5)
Total CK: 131 U/L (ref 7–232)
Total CK: 171 U/L (ref 7–232)
Troponin I: 1.83 ng/mL (ref 0.00–0.06)

## 2011-02-06 LAB — PHOSPHORUS
Phosphorus: 2.6 mg/dL (ref 2.3–4.6)
Phosphorus: 2.8 mg/dL (ref 2.3–4.6)
Phosphorus: 3.7 mg/dL (ref 2.3–4.6)

## 2011-02-06 LAB — TYPE AND SCREEN

## 2011-02-06 LAB — AMYLASE: Amylase: 89 U/L (ref 0–105)

## 2011-02-06 LAB — APTT
aPTT: 30 seconds (ref 24–37)
aPTT: 40 seconds — ABNORMAL HIGH (ref 24–37)

## 2011-02-06 LAB — SURGICAL PCR SCREEN: MRSA, PCR: NEGATIVE

## 2011-02-06 LAB — BRAIN NATRIURETIC PEPTIDE
Pro B Natriuretic peptide (BNP): 1036 pg/mL — ABNORMAL HIGH (ref 0.0–100.0)
Pro B Natriuretic peptide (BNP): 1424 pg/mL — ABNORMAL HIGH (ref 0.0–100.0)

## 2011-02-06 LAB — CK TOTAL AND CKMB (NOT AT ARMC)
CK, MB: 7.8 ng/mL (ref 0.3–4.0)
Total CK: 107 U/L (ref 7–232)
Total CK: 148 U/L (ref 7–232)
Total CK: 98 U/L (ref 7–232)

## 2011-02-06 LAB — MRSA PCR SCREENING: MRSA by PCR: NEGATIVE

## 2011-02-06 LAB — MAGNESIUM
Magnesium: 2 mg/dL (ref 1.5–2.5)
Magnesium: 2 mg/dL (ref 1.5–2.5)
Magnesium: 1.5 mg/dL (ref 1.5–2.5)

## 2011-02-06 LAB — PROTIME-INR: INR: 0.98 (ref 0.00–1.49)

## 2011-02-06 LAB — PREALBUMIN: Prealbumin: 9.7 mg/dL — ABNORMAL LOW (ref 18.0–45.0)

## 2011-02-06 LAB — HEMOGLOBIN A1C: Mean Plasma Glucose: 117 mg/dL — ABNORMAL HIGH (ref <117)

## 2011-02-06 LAB — LACTIC ACID, PLASMA: Lactic Acid, Venous: 1 mmol/L (ref 0.5–2.2)

## 2011-02-08 LAB — DIFFERENTIAL
Basophils Absolute: 0 10*3/uL (ref 0.0–0.1)
Basophils Relative: 0 % (ref 0–1)
Lymphocytes Relative: 16 % (ref 12–46)
Neutro Abs: 5.5 10*3/uL (ref 1.7–7.7)
Neutrophils Relative %: 76 % (ref 43–77)

## 2011-02-08 LAB — URINALYSIS, ROUTINE W REFLEX MICROSCOPIC
Glucose, UA: NEGATIVE mg/dL
Hgb urine dipstick: NEGATIVE
Ketones, ur: NEGATIVE mg/dL
Protein, ur: NEGATIVE mg/dL
pH: 5 (ref 5.0–8.0)

## 2011-02-08 LAB — POCT I-STAT, CHEM 8
Glucose, Bld: 122 mg/dL — ABNORMAL HIGH (ref 70–99)
HCT: 42 % (ref 39.0–52.0)
Hemoglobin: 14.3 g/dL (ref 13.0–17.0)
Potassium: 5 mEq/L (ref 3.5–5.1)
Sodium: 140 mEq/L (ref 135–145)

## 2011-02-08 LAB — LIPASE, BLOOD: Lipase: 12 U/L (ref 11–59)

## 2011-02-08 LAB — COMPREHENSIVE METABOLIC PANEL
Alkaline Phosphatase: 122 U/L — ABNORMAL HIGH (ref 39–117)
BUN: 34 mg/dL — ABNORMAL HIGH (ref 6–23)
Chloride: 112 mEq/L (ref 96–112)
Creatinine, Ser: 1.55 mg/dL — ABNORMAL HIGH (ref 0.4–1.5)
GFR calc non Af Amer: 44 mL/min — ABNORMAL LOW (ref >60)
Glucose, Bld: 124 mg/dL — ABNORMAL HIGH (ref 70–99)
Potassium: 4.9 mEq/L (ref 3.5–5.1)
Total Bilirubin: 0.7 mg/dL (ref 0.3–1.2)

## 2011-02-08 LAB — CBC
HCT: 38.5 % — ABNORMAL LOW (ref 39.0–52.0)
Hemoglobin: 13.3 g/dL (ref 13.0–17.0)
MCV: 94.1 fL (ref 78.0–100.0)
WBC: 7.3 10*3/uL (ref 4.0–10.5)

## 2011-02-22 LAB — BLOOD GAS, ARTERIAL
Acid-base deficit: 14 mmol/L — ABNORMAL HIGH (ref 0.0–2.0)
Drawn by: 24610
FIO2: 0.21 %
pCO2 arterial: 29.5 mmHg — ABNORMAL LOW (ref 35.0–45.0)

## 2011-04-05 NOTE — Procedures (Signed)
DUPLEX ULTRASOUND OF ABDOMINAL AORTA   INDICATION:  Follow-up of abdominal aortic aneurysm.   HISTORY:  Diabetes:  No  Cardiac:  No  Hypertension:  No  Smoking:  No  Connective Tissue Disorder:  Family History:  Previous Surgery:   DUPLEX EXAM:         AP (cm)                   TRANSVERSE (cm)  Proximal             2.60 cm                   2.46 cm  Mid                  5.44 cm                   5.36 cm  Distal               3.93 cm                   3.84 cm  Right Iliac          1.09 cm                   1.23 cm  Left Iliac           1.21 cm                   1.28 cm    PREVIOUS:  Date:  AP:  5.31  TRANSVERSE:  5.39   IMPRESSION:  Abdominal aortic aneurysm noted with the largest  measurement of (5.44 cm x 5.36 cm).   ___________________________________________  V. Leia Alf, MD   MG/MEDQ  D:  08/03/2009  T:  08/04/2009  Job:  [id number]

## 2011-04-05 NOTE — Procedures (Signed)
DUPLEX ULTRASOUND OF ABDOMINAL AORTA   INDICATION:  Followup of known abdominal aortic aneurysm.   HISTORY:  Diabetes:  No.  Cardiac:  No.  Hypertension:  No.  Smoking:  No.  Connective Tissue Disorder:  Family History:  Previous Surgery:  No.   DUPLEX EXAM:         AP (cm)                   TRANSVERSE (cm)  Proximal             2.87 cm.                  2.94 cm.  Mid                  5.2 cm.                   5.34 cm.  Distal               3.5 cm.                   3.99 cm.  Right Iliac          (Not well visualized.)    (Not well visualized.)  Left Iliac           (Not well visualized.)    (Not well visualized.).   PREVIOUS ABI:  Right:  4.58  Left:  4.81   PREVIOUS:  Date:        AP:         TRANSVERSE:   IMPRESSION:  1. Abdominal aortic aneurysm noted with the largest measure of 5.2 x      5.34 cm.  2. Slightly increased in size compared to previous study.   ___________________________________________  P. Drucie Opitz, M.D.   MG/MEDQ  D:  06/28/2007  T:  06/29/2007  Job:  [id number]

## 2011-04-05 NOTE — Assessment & Plan Note (Signed)
OFFICE VISIT   [name], Christian PAVIS  DOB:  [birth year]                                       03/26/2009  [id number]   The patient returned to the office today.  He has found out no  information regarding financial counseling in relation to open AAA  repair.  I have contacted [facility] financial counseling myself.  Left  them a message to call me today.  I will discuss this with them and plan  to follow-up with the patient at that time.   Dorothea Glassman, M.D.  Electronically Signed   PGH/MEDQ  D:  03/26/2009  T:  03/27/2009  Job:  [id number]

## 2011-04-05 NOTE — Assessment & Plan Note (Signed)
OFFICE VISIT   Sumlin, Araf V  DOB:  Oct 19, 1931                                       02/28/2008  [id number]   Patient has a known abdominal aortic aneurysm.  This was initially seen  in consultation back in December, 2005.  He has been following up here  on a regular basis with serial ultrasound evaluations.   He underwent an ultrasound today which reveals his aneurysm to be 5.2 cm  in maximal diameter.  This is not significantly changed from a study  carried out six months ago.  He has had slow, progressive growth of his  aneurysm; however, this has remained asymptomatic, and he has continued  to comply with surveillance ultrasound.   The patient denies abdominal pain or back pain.  He was recently in the  hospital up [facility] with urinary tract problems, sounds like urinary  retention with prostate issues.   CURRENT MEDICATIONS:  Thyroid supplement.   ALLERGIES:  He is allergic to aspirin.   PHYSICAL EXAMINATION:  Patient is a 77 year old gentleman who appears  his approximately stated age.  Alert and oriented.  No acute distress.  BP 155/81, pulse 57 per minute, respirations 16 per minute.  His neck  reveals no carotid bruits.  Chest is clear with equal air entry  bilaterally.  No rales or rhonchi.  Normal heart sounds without murmurs.  Abdomen is soft.  AAA palpable.  No organomegaly or other masses felt.  Nontender.  Normal bowel sounds.  Femoral pulses 2+ bilaterally.  No  ankle edema.   Patient has a stable 5.2 cm abdominal aortic aneurysm.  This remains  asymptomatic.  We will plan continued follow-up surveillance with  abdominal ultrasound.  I have cautioned him regarding the onset of any  symptoms to please contact me at that time.   Dorothea Glassman, M.D.  Electronically Signed   PGH/MEDQ  D:  02/28/2008  T:  02/28/2008  Job:  [id number]   cc:   Gwenlyn Perking, M.D.

## 2011-04-05 NOTE — Discharge Summary (Signed)
[name], [name]                 ACCOUNT NO.:  [id number]   MEDICAL RECORD NO.:  [id number]          PATIENT TYPE:  INP   LOCATION:  1415                         FACILITY[facility]  [facility]   PHYSICIAN:  Marlene Bast, MDDATE OF BIRTH:  09/15/1931   DATE OF ADMISSION:  04/17/2007  DATE OF DISCHARGE:  04/20/2007                               DISCHARGE SUMMARY   DISCHARGE MEDICATIONS:  Synthroid 50 mcg p.o. daily.   HISTORY OF PRESENT ILLNESS:  [name] is a 77 year old gentleman who  presented to the hospital with abdominal pain for greater than one  month.  It was anterior and also radiated into his back.  The patient  had a CT scan in the emergency department, which revealed findings  consistent with pancreatitis, as well as multiple pseudo-cysts.  The  patient was then admitted to the hospital.  He admits to drinking  approximately one beer a day.  He also says that he had pancreatitis  approximately 20 years ago.   HOSPITAL COURSE:  #1 - PANCREATITIS:  He was put on clear liquids and IV  fluids, and over the course of the next 48 hours, he did very well.  Essentially he did not require additional pain medication.  We advanced  his diet slowly because of the pseudo-cysts.  His lipase was normal.  He  was on clear liquids, which he remained on for 48 hours.  He then went  to full liquids.  We will advance the patient's diet to a bland low-fat  diet, but will continue the diet there for the next several weeks.  I  talked to the patient at length about diet, and he says he can eat soups  and relatively soft foods for one week or two without any difficulty.  So that is the plan for the next couple of weeks.  He will follow up  with the primary care physician for a repeat CT in approximately one  month. We talked about no alcohol.  The patient says that it would be  easy for him to do.  He will oblige.   #2 - BILATERAL HYDROURETERONEPHROSIS and urinary retention:  This was  seen also  on CAT scan on arrival.  The patient refused any catheter  initially.  Bladder scans revealed that he had over a liter in his  bladder.  Finally after two days in the hospital, he did agree to one in  and out catheterization.  Approximately 1200 mL were obtained during  that catheterization.  The patient has been going to the bathroom and  urinating on his own throughout the hospital stay.  He essentially felt  that he did not need the catheter, despite the fact that we explained  his CT results and the possible consequences at length.  The patient  tells me he likely has benign prostatic hypertrophy.  He does have  urinary symptoms, consistent with benign prostatic hypertrophy.  He also  tells me he has had a PSA checked recently which was normal.  He was  absolutely not interested in starting any benign prostatic hypertrophy  medication.  He says he does not want to take any chemicals.  After a  couple of days, however, he did agree to see a urologist in followup as  an outpatient.  It actually even sounds that he would be more willing to  have a transurethral resection of the prostate than to take medication  for the benign prostatic hypertrophy.  That is his choice.  We will make  an appointment with a urologist for followup.   #3 - MILD RENAL INSUFFICIENCY:  I spoke to the patient at length about  this.  As well, he did not know that his kidneys were functioning  anything less than normal.  I did explain also that the hydronephrosis  could be effecting his kidney function and could lead to worsening  kidney function and kidney failure.  He understands this, but again is  not willing to have any type of treatment at this point.   #4 - HYPERTENSION:  The patient's blood pressures were running in the  140's to 160's during this hospital stay in a patient with diabetes.  Recommended systolic blood pressure is less than 130.  We attempted to  put the patient on a beta blocker but he  refused.  He refused any  antihypertensive medication.   #5 - DIABETES:  The patient for the most part refused insulin during  this hospital stay.  When he was on the clear liquid diet, he said that  he believed that some of the sugar in the liquids were the cause of all  of his hyperglycemia and he was sure that would go away when he was out  of the hospital.  Also believes that his pancreatitis may be making his  sugars high and creating this hyperglycemia which was not the usual  problem for him.  He would not let us treat this.  We are still  recommending a low-carbohydrate diet, which will be very difficult while  he is on soft bland food, but he will follow up with his primary care  physician and have further discussion about this and perhaps he will  consider treatment in the future.  The patient's hemoglobin A1c was 7.6.   #6 - A 5 CM X 5 CM ABDOMINAL AORTIC ANEURYSM:  The patient said he knew  he had an abdominal aortic aneurysm and he gets CT scans done every six  months by one of the surgeons.  He says he believes it was 3 cm.  I  reviewed the indications for intervention for surgery or stent  insertion, and have advised him to continue followup with his surgeon.   #7 - SMALL LIVER CYSTS AND BILATERAL RENAL CYSTS:  Found by CT.  No  intervention required.   LABORATORY DATA:  The patient's TSH was 7.7.  His fasting lipid profile  was with a cholesterol of 113, triglyceride level of 80, HDL 39, LDL 58.  His last BUN and creatinine were 36 and 1.9.  Other chemistries were  essentially normal.  His transaminases were normal.  CBC revealed a  white count of 7.7 and a hemoglobin of 10, platelet count 200.   Please see the H&P. dictated on Apr 17, 2007, for further details.  The  patient knew he is hypothyroid.  He has not taken his thyroid medication  in over one year, and he said he was feeling fatigued and just did not  have a way to get a refill of his  prescription.  DISCHARGE DIAGNOSES:  1. 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  2. Hypothyroidism, untreated.  3. Renal insufficiency, of unclear etiology.  Followup recommended.  4. Bilateral hydroureteronephrosis.  The patient refused to have any      intervention during his hospital.  He finally agreed to follow up      with the urologist once discharged.  5. A 5 cm x 5 cm abdominal aortic aneurysm.  6. One small liver cyst and bilateral renal cysts.  7. Diabetes mellitus, type 2, untreated.  The patient is refusing      treatment.  8. Hypertension.  The patient is refusing treatment.  9. Deep venous thrombosis prophylaxis.  The patient used Lovenox      during this hospital stay and tells me that he had a deep venous      thrombosis many, many years ago.  10.Peptic ulcer disease.  The patient tells me that he has had ulcers      in the past, but he has never had an EGD.  He refused Protonix      while he was here in the hospital.  We have recommended over-the-      counter Prilosec.  He said he will consider it.   The patient does say he will follow up with a primary care physician,  possibly Dr. Barbette Merino.  We will also arrange for urology followup.  Addendum:  I called the patient at home several days after discharge and  informed him of his increased creatnine .  He was planning on keeping  his urology appointment within 48 hours  which had already been made.      Marlene Bast, MD  Electronically Signed     CVC/MEDQ  D:  04/20/2007  T:  04/20/2007  Job:  [id number]   cc:   Barbette Merino, M.D.

## 2011-04-05 NOTE — Procedures (Signed)
DUPLEX ULTRASOUND OF ABDOMINAL AORTA   INDICATION:  Followup, abdominal aortic aneurysm.   HISTORY:  Diabetes:  No.  Cardiac:  No.  Hypertension:  No.  Smoking:  No.  Connective Tissue Disorder:  Family History:  Previous Surgery:   DUPLEX EXAM:         AP (cm)                   TRANSVERSE (cm)  Proximal             2.81 cm                   2.66 cm  Mid                  5.31 cm                   5.39 cm  Distal               2.12 cm                   2.36 cm  Right Iliac          1.46 cm                   1.65 cm  Left Iliac           1.43 cm                   1.46 cm   PREVIOUS:  Date:  AP:  4.95  TRANSVERSE:  5.24   IMPRESSION:  Abdominal aortic aneurysm noted with the largest  measurement of 5.31 cm X 5.39 cm.   Patient was notified to make an appointment to see Dr. Amedeo Plenty on  08/07/08.   ___________________________________________  P. Drucie Opitz, M.D.   MG/MEDQ  D:  08/05/2008  T:  08/05/2008  Job:  [id number]

## 2011-04-05 NOTE — Assessment & Plan Note (Signed)
OFFICE VISIT   [name], [name]  DOB:  1931-09-20                                       08/03/2009  [id number]   REASON FOR VISIT:  Abdominal aneurysm.   HISTORY:  This is a 77 year old gentleman who is formally a patient of  Dr. Amedeo Plenty who was following a large infrarenal abdominal aortic  aneurysm.  The patient was last seen in April, at which time his  aneurysm measured approximately 5.6 cm.  The patient had concerns with  financial repercussions and was referred to financial aid.  He now comes  back for further discussions.  He has not been having any abdominal  pain, he denied chest pain and shortness of breath.   REVIEW OF SYSTEMS:  GENERAL:  Negative for fevers, chills, weight gain,  weight loss.  CARDIAC:  Negative for chest pain, chest pressure.  PULMONARY:  Negative for shortness of breath.  Positive for productive  cough.  GI:  Negative.  GU:  Negative.  EXTREMITIES:  Are positive for pain in his joint with walking.   PAST MEDICAL HISTORY:  Borderline diabetes and hypertension,   PAST SURGICAL HISTORY:  None.   SOCIAL HISTORY:  He is an Tour manager.  He does not  currently smoke, quit several years ago.  Occasional alcohol, daily.   FAMILY HISTORY:  Noncontributory.   MEDICATIONS:  Thyroid supplement.   ALLERGIES:  Aspirin.   PHYSICAL EXAMINATION:  Blood pressure is 170/74, pulse is 65.  General:  He is well-appearing, no acute stress.  HEENT:  Normocephalic,  atraumatic.  Pupils equal.  Sclerae anicteric.  Neck:  Supple.  No JVD.  No carotid bruits.  Cardiovascular:  Regular rate and rhythm.  No  murmurs.  Pulmonary:  Lungs are clear bilaterally.  Abdomen:  Soft,  nontender.  Positive pulsatile mass.  Extremities:  He has palpable  femoral pulses.  I cannot palpate pedal pulses.  Neuro:  Cranial nerves  II through XII are grossly intact.  Psych:  He is alert and oriented  times 3.   DIAGNOSTIC STUDIES:   The patient had a duplex ultrasound today which  reveals a 5.5 cm aneurysm, relatively stable in size.   ASSESSMENT/PLAN:  Infrarenal abdominal aortic aneurysm.   PLAN:  Based on the patient's neck length as well as diameter, he is not  a good candidate for stent graft.  In addition, he has borderline renal  insufficiency, his most recent creatinine is 1.7.  For that reason, he  was deemed by Dr. Amedeo Plenty, as well as myself, as a better candidate for  open repair.  I discussed this extensively with diagrams with the  patient.  We discussed the risk of cardiac complications, pulmonary  complications, renal complications including low possibility of going  into dialysis.  We discussed atherosclerotic embolization to the legs as  well as the intestines as well as the risk of impotence and retrograde  ejaculation.  At this point, the patient, due to his job restrictions,  cannot schedule his operation.  He is looking at doing this mid-October  to early November, but he also says it may need to wait until the  beginning of the year.  He understands that he is at risk for a rupture  and if that happens his risk of dying is increased substantially.  He is  going to get a carotid ultrasound today and I am going to send him for  an exercise Myoview to evaluate his cardiac function.  The patient is  going to contact us to schedule his operation.   [name], MD  Electronically Signed   VWB/MEDQ  D:  08/03/2009  T:  08/04/2009  Job:  [id number]

## 2011-04-05 NOTE — Assessment & Plan Note (Signed)
OFFICE VISIT   [name], [name]  DOB:  [birth year]                                       09/27/2010  [id number]   The patient returned today for followup.  He is status post open  abdominal aortic aneurysm repair on 05/19/2010.  He developed what was  thought to be DTs in the postoperative course which required  reintubation.  He has made a full recovery and comes back in today for  followup.  When I last him after his operation he was complaining of  fatigue and appetite.  However, these problems have resolved.  His  biggest complaint today is increase in bowel movements.  He has probably  4-5 stools a day.  There is no blood in his stool that he can see.  He  is back to work.  He does complain of leg pain which has been a constant  problem for him.   PHYSICAL EXAMINATION:  Vital signs:  Heart rate 78, blood pressure  129/62, O2 sat 97%.  General:  He is well-appearing, in no distress.  HEENT:  Within normal limits.  Lungs:  Clear bilaterally.  Abdomen:  Soft, nontender.  No hernias appreciated.  He has palpable pedal pulses  bilaterally.  Skin:  Without rash.   ASSESSMENT AND PLAN:  Status post abdominal aneurysm.  The patient is  doing very well at this time.  I am very pleased with his progress.  I  will plan having him come back in 3 years for ultrasound.   Leg pain:  I do not feel like his leg pain is related to arterial  insufficiency because I can palpate pedal pulses.   Increased bowel frequency:  I have asked him to make an appointment with  Dr. Redmond School, his primary care physician, to have this further evaluated.  He says he has not had his thyroid checked in several years.  It could  potentially be contributing to this.  In addition, he will need to make  sure that he does not have heme positive stools.  I will defer further  workup and evaluation to Dr. Redmond School.     [name], MD  Electronically Signed   VWB/MEDQ  D:   09/27/2010  T:  09/27/2010  Job:  [id number]   cc:   Jill Alexanders, M.D.

## 2011-04-05 NOTE — H&P (Signed)
[name], DERITA                 ACCOUNT NO.:  [id number]   MEDICAL RECORD NO.:  [id number]          PATIENT TYPE:  INP   LOCATION:  [id number]                         FACILITY:  [facility]   PHYSICIAN:  Jana Hakim, M.D. DATE OF BIRTH:  1931-10-27   DATE OF ADMISSION:  04/17/2007  DATE OF DISCHARGE:                              HISTORY & PHYSICAL   This is an unassigned patient.   CHIEF COMPLAINT:  Abdominal pain.   HISTORY OF PRESENT ILLNESS:  This is a 77 year old man who complains of  having epigastric pain radiating into the back and chest.  He has  reported having this pain for three weeks.  The pain has been associated  with nausea after eating.  He also has had decreased p.o. intake without  vomiting.  He denies having any diarrhea, melena, or hematochezia.  He  has had pancreatitis in the past.  When questioned about alcohol intake,  he admits to drinking one half a can of beer per day.  He denies having  any fevers, chills, shortness of breath, genitourinary problems, muscle  pain, or arthralgias.   PAST MEDICAL HISTORY:  Abdominal aortic aneurysm, hypertension,  hypothyroidism, questionable type-2 diabetes mellitus.   PAST SURGICAL HISTORY:  None.   MEDICATIONS:  None.   ALLERGIES:  ASPIRIN WHICH DECREASES BLOOD PRESSURE.   SOCIAL HISTORY:  Lives at home with his son.  He works Architect.  Nonsmoker.  Drinking history:  reports drinking only half a can of beer  per day, for how long this is questionable.   PHYSICAL EXAMINATION FINDINGS:  GENERAL:  Thin 77 year old well-  nourished, well-developed male in discomfort but no acute distress.  VITAL SIGNS: Temperature 98.2, blood pressure 163/97, heart rate 83,  respirations 20, O2 saturations 96-99%.  HEENT: Normocephalic,  atraumatic.  Pupils equally round and reactive to light.  Extraocular  muscles are intact.  Funduscopic benign.  There is no scleral icterus.  Oropharynx is clear.  NECK:  Supple.  Full range of  motion.  No  thyromegaly, adenopathy, jugular venous distension.  CARDIOVASCULAR:  Regular rate and rhythm.  LUNGS:  Clear to auscultation bilaterally.  ABDOMEN:  Positive bowel sounds, mildly decreased, soft, mildly tender  in the epigastrium.  No rebound, no guarding, no hepatosplenomegaly.  EXTREMITIES: Without cyanosis, clubbing, or edema.  NEUROLOGIC:  Alert  and oriented x3.  Very guarded and agitated.  Cranial nerves are intact.  There are no focal deficits.   LABORATORY STUDIES:  White blood cell count 8.1, hemoglobin 11.0,  hematocrit 32.5, platelets 224, neutrophils 80%, MCV 84.1.  Chemistry:  Sodium 140, potassium 4.3, chloride 113, CO2 of 18, BUN 40, creatinine  1.7, glucose 186, amylase 88, lipase 17, D-dimer 3.33.  EKG is normal  sinus rhythm, and CT of the abdomen reveals an abnormal pancreas and  changes that are consistent with acute on chronic pancreatitis along  with small pseudocysts.  Cardiac enzymes:  Total CK is equal to 73, CK-  MB 4.0, troponin 0.02,   This is a 77 year old male being admitted with:  1. Epigastric abdominal pain.  2. Acute  on chronic pancreatitis.  3. Hypertension  4. Hyperglycemia.  5. Anemia.  6. Dehydration.  7. Questionable alcohol abuse history.   PLAN:  The patient will be admitted to telemetry area, and cardiac  enzymes will be performed.  The patient will be placed on a clear liquid  diet and have IV fluids, pain control, and antiemetic therapy.  IV  Protonix has also been ordered q.12 h.  The patient will be placed on  DVT prophylaxis.  A VQ scan also will be ordered secondary to the  elevated D-dimer; however, this may be an acute phase reactant from his  inflammatory process.  The patient will also be placed on Lopressor IV  for elevated blood pressures.  Alcohol withdrawal therapy has also been  ordered.  Multivitamin, folate, and thiamine per the IV fluids.  Also an  alcohol level will be checked along with a GGT, and the  basic metabolic  panel will be expanded to a complete metabolic panel.      Jana Hakim, M.D.  Electronically Signed     HJ/MEDQ  D:  04/17/2007  T:  04/18/2007  Job:  [id number]

## 2011-04-05 NOTE — Procedures (Signed)
DUPLEX ULTRASOUND OF ABDOMINAL AORTA   INDICATION:  Followup abdominal aortic aneurysm.   HISTORY:  Diabetes:  No.  Cardiac:  No.  Hypertension:  No.  Smoking:  No.  Connective Tissue Disorder:  Family History:  Previous Surgery:  No.   DUPLEX EXAM:         AP (cm)                   TRANSVERSE (cm)  Proximal             4.21 cm                   4.48 cm  Mid                  4.95 cm                   5.24 cm  Distal               2.38 cm                   2.71 cm  Right Iliac          1.26 cm                   cm  Left Iliac           1.21 cm                   cm   PREVIOUS:  Date: 06/28/2007  AP:  5.2  TRANSVERSE:  5.34   IMPRESSION:  Stable measurements of known abdominal aortic aneurysm.   ___________________________________________  P. Drucie Opitz, M.D.   DP/MEDQ  D:  02/28/2008  T:  02/28/2008  Job:  [id number]

## 2011-04-05 NOTE — Assessment & Plan Note (Signed)
OFFICE VISIT   [name], [name]  DOB:  30-Mar-1931                                       06/28/2010  [id number]   Patient comes back today for follow-up.  He underwent open abdominal  aortic aneurysm with a tube graft on 05/19/2010.  Postoperatively he  developed what was thought to be DTs.  Got reintubated.  A full  neurologic workup was done, which was negative.  He ultimately snapped  out of it and recovered nicely and was discharged home.   He is back today, doing extremely well.  He does have some problems with  fatigue, and his appetite is down.  He is having bowel movements on a  daily basis.  His incision is healing nicely.  There is no hernia.  There is no infection.  His legs are warm.   I am very pleased with the progress that he has made.  I will plan on  seeing him back in 3 months to make sure he continues to do okay.     [name], MD  Electronically Signed   VWB/MEDQ  D:  06/28/2010  T:  06/29/2010  Job:  [id number]

## 2011-04-05 NOTE — Assessment & Plan Note (Signed)
OFFICE VISIT   [name], [name]  DOB:  [birth year]                                       08/07/2008  [id number]   The patient is seen back in the office today after undergoing an  ultrasound earlier this week.  His aneurysm has increased in size  slightly now measuring 5.39 cm in maximal diameter.  He has been free of  any symptoms.  We will plan continued surveillance with a CT angiogram  in 6 months.   Dorothea Glassman, M.D.  Electronically Signed   PGH/MEDQ  D:  08/07/2008  T:  08/09/2008  Job:  [id number]

## 2011-04-05 NOTE — Assessment & Plan Note (Signed)
OFFICE VISIT   [name]  DOB:  [birth year]                                       05/10/2010  [id number]   REASON FOR VISIT:  Preoperative aneurysm repair.   HISTORY:  This is a 77 year old gentleman that I saw originally in  October 2010 for a 5.6-cm aneurysm.  He is a former patient of Dr.  Amedeo [name].  I had recommended open repair, as he was not a candidate for  endovascular repair.  The patient refused to proceed with his operation  due to financial concerns.  He has recently seen in the emergency  department with abdominal pain by Dr. Kellie [name].  His aneurysm had grown to  5.9 cm.  He is scheduled for repair.  He comes in today for further  questions.  He has been on antibiotics recently for possible bladder  infection, as he had lower abdominal pain.  He does still complain of  pain in his legs.   The patient continues to be borderline diabetic and hypertensive.  These  are not treated currently.   REVIEW OF SYSTEMS:  Positive for easy bruising.  Negative for chest  pain, negative shortness of breath.  No fevers, chills.  No weight gain,  no weight loss. Occasional lower abdominal pain, bilateral leg pain.  All other review of systems are negative.   PAST MEDICAL HISTORY:  Borderline diabetes, hypertension.   PAST SURGICAL HISTORY:  None.   SOCIAL HISTORY:  He is an Tour manager.  He does not  smoke, quit several years ago.  Occasional alcohol.   FAMILY HISTORY:  Negative for cardiovascular disease at an early age.   ALLERGIES:  Aspirin.   PHYSICAL EXAMINATION:  Heart rate 64, blood pressure 121/56, temperature  is 98.0.  General:  Well-appearing, in no distress.  HEENT:  Within  normal limits.  Lungs:  Clear bilaterally.  Cardiovascular:  Regular  rate and rhythm.  Abdomen:  Soft, nontender.  Musculoskeletal:  Without  major deformity.  Neuro:  He has no focal deficits or weaknesses.  He  does have multiple  ecchymoses in his upper arms.   ASSESSMENT:  Asymptomatic juxtarenal aneurysm.   PLAN:  The patient is scheduled for open aneurysm repair to be performed  this Wednesday.  All of the risks and benefits have previously been  discussed and were reiterated with the patient today in the office.  All  of his questions were answered.     Christian Abrahams, MD  Electronically Signed   VWB/MEDQ  D:  05/10/2010  T:  05/11/2010  Job:  [id number]

## 2011-04-05 NOTE — Assessment & Plan Note (Signed)
OFFICE VISIT   Ryback, Taysen V  DOB:  11/18/1931                                       03/12/2009  [id number]   The patient is followed regularly with a known abdominal aortic  aneurysm.  He underwent CT scan prior to this visit.  This a noncontrast  CT scan.  Reveals a maximal diameter 5.6 cm of his abdominal aortic  aneurysm.  This is relatively stable in size.  However, on review of his  images this is an accurate measurement.  The proximal architecture of  the aneurysm reveals it to be closely adjacent to the origin of the  renal arteries, which would likely preclude placement of an aortic stent  graft.  He does have chronic renal insufficiency with BUN 38, creatinine  1.74.   Best option would likely be an open operative repair.   The patient appears generally well.  He is alert and oriented.  Blood  pressure is 165/70, pulse 67 per minute.  His abdomen soft and  nontender.  AAA palpable.  No other masses felt.  No organomegaly.  Intact femoral pulses bilaterally.   I think the best option would be an open operative repair for the  patient; however, he is concerned about the financial repercussions and  any potential co-pays that he may be responsible for.  We have set him  up with financial counseling at [facility] for a  review of the planned surgery.  Plan to return visit following that and  determine the next option.   Dorothea Glassman, M.D.  Electronically Signed   PGH/MEDQ  D:  03/12/2009  T:  03/13/2009  Job:  [id number]

## 2011-04-05 NOTE — Procedures (Signed)
CAROTID DUPLEX EXAM   INDICATION:  Preop for abdominal aortic aneurysm surgery.   HISTORY:  Diabetes:  No.  Cardiac:  No.  Hypertension:  Yes.  Smoking:  Quit about 5 years ago.  Previous Surgery:  No.  CV History:  No.  Amaurosis Fugax No, Paresthesias No, Hemiparesis No                                       RIGHT             LEFT  Brachial systolic pressure:  Brachial Doppler waveforms:  Vertebral direction of flow:        antegrade         antegrade  DUPLEX VELOCITIES (cm/sec)  CCA peak systolic                   66                82  ECA peak systolic                   90                66  ICA peak systolic                   85                66  ICA end diastolic                   23                14  PLAQUE MORPHOLOGY:                  calcified         calcified  PLAQUE AMOUNT:                      mild              mild  PLAQUE LOCATION:                    ICA and ECA       ICA and ECA   IMPRESSION:  1. A  1- 39% stenosis noted in bilateral internal carotid arteries.  2. Antegrade bilateral vertebral arteries.    ___________________________________________  V. Leia Alf, MD   MG/MEDQ  D:  08/03/2009  T:  08/04/2009  Job:  [id number]

## 2011-05-13 ENCOUNTER — Other Ambulatory Visit: Payer: Self-pay | Admitting: Family Medicine

## 2011-08-19 LAB — COMPREHENSIVE METABOLIC PANEL
BUN: 31 — ABNORMAL HIGH
CO2: 21
Chloride: 108
Creatinine, Ser: 1.65 — ABNORMAL HIGH
GFR calc non Af Amer: 41 — ABNORMAL LOW
Total Bilirubin: 0.8

## 2011-08-19 LAB — DIFFERENTIAL
Basophils Absolute: 0
Basophils Relative: 1
Eosinophils Absolute: 0.1
Eosinophils Relative: 2
Lymphocytes Relative: 19
Lymphs Abs: 1
Monocytes Absolute: 0.4
Monocytes Relative: 7
Neutro Abs: 3.8
Neutrophils Relative %: 71

## 2011-08-19 LAB — URINALYSIS, ROUTINE W REFLEX MICROSCOPIC
Bilirubin Urine: NEGATIVE
Glucose, UA: NEGATIVE
Hgb urine dipstick: NEGATIVE
Ketones, ur: NEGATIVE
Nitrite: NEGATIVE
Protein, ur: NEGATIVE
Specific Gravity, Urine: 1.016
Urobilinogen, UA: 0.2
pH: 5

## 2011-08-19 LAB — SAMPLE TO BLOOD BANK

## 2011-08-19 LAB — COMPREHENSIVE METABOLIC PANEL WITH GFR
ALT: 31
AST: 23
Albumin: 3.7
Alkaline Phosphatase: 95
Calcium: 8.7
GFR calc Af Amer: 49 — ABNORMAL LOW
Glucose, Bld: 119 — ABNORMAL HIGH
Potassium: 4.9
Sodium: 136
Total Protein: 6.6

## 2011-08-19 LAB — CBC
HCT: 36 — ABNORMAL LOW
Hemoglobin: 11.9 — ABNORMAL LOW
MCHC: 33.1
MCV: 94.2
Platelets: 159
RBC: 3.82 — ABNORMAL LOW
RDW: 15.2
WBC: 5.4

## 2011-08-19 LAB — URINE CULTURE: Colony Count: 40000

## 2011-08-19 LAB — URINE MICROSCOPIC-ADD ON

## 2011-08-19 LAB — LIPASE, BLOOD: Lipase: 11

## 2011-09-01 LAB — URINALYSIS, ROUTINE W REFLEX MICROSCOPIC
Bilirubin Urine: NEGATIVE
Nitrite: NEGATIVE
Specific Gravity, Urine: 1.013
pH: 5

## 2011-09-01 LAB — URINE MICROSCOPIC-ADD ON

## 2011-09-01 LAB — URINE CULTURE

## 2011-10-10 ENCOUNTER — Other Ambulatory Visit: Payer: Self-pay | Admitting: Family Medicine

## 2011-10-11 ENCOUNTER — Other Ambulatory Visit: Payer: Self-pay | Admitting: Family Medicine

## 2011-10-11 NOTE — Telephone Encounter (Signed)
Pt has set up an appt for 11/26 since he hasnt been in a while. Refilled for a month

## 2011-10-17 ENCOUNTER — Encounter: Payer: Self-pay | Admitting: Family Medicine

## 2011-10-17 ENCOUNTER — Ambulatory Visit (INDEPENDENT_AMBULATORY_CARE_PROVIDER_SITE_OTHER): Payer: Medicare Other | Admitting: Family Medicine

## 2011-10-17 VITALS — BP 130/74 | HR 66 | Wt 162.0 lb

## 2011-10-17 DIAGNOSIS — J310 Chronic rhinitis: Secondary | ICD-10-CM

## 2011-10-17 DIAGNOSIS — R7309 Other abnormal glucose: Secondary | ICD-10-CM

## 2011-10-17 DIAGNOSIS — N400 Enlarged prostate without lower urinary tract symptoms: Secondary | ICD-10-CM

## 2011-10-17 DIAGNOSIS — J410 Simple chronic bronchitis: Secondary | ICD-10-CM

## 2011-10-17 DIAGNOSIS — Z79899 Other long term (current) drug therapy: Secondary | ICD-10-CM

## 2011-10-17 DIAGNOSIS — E039 Hypothyroidism, unspecified: Secondary | ICD-10-CM

## 2011-10-17 DIAGNOSIS — R7302 Impaired glucose tolerance (oral): Secondary | ICD-10-CM

## 2011-10-17 LAB — COMPREHENSIVE METABOLIC PANEL
Albumin: 4.1 g/dL (ref 3.5–5.2)
BUN: 36 mg/dL — ABNORMAL HIGH (ref 6–23)
CO2: 15 mEq/L — ABNORMAL LOW (ref 19–32)
Calcium: 7.4 mg/dL — ABNORMAL LOW (ref 8.4–10.5)
Chloride: 111 mEq/L (ref 96–112)
Creat: 1.7 mg/dL — ABNORMAL HIGH (ref 0.50–1.35)
Glucose, Bld: 109 mg/dL — ABNORMAL HIGH (ref 70–99)

## 2011-10-17 LAB — CBC WITH DIFFERENTIAL/PLATELET
Eosinophils Relative: 3 % (ref 0–5)
HCT: 34 % — ABNORMAL LOW (ref 39.0–52.0)
Hemoglobin: 11.3 g/dL — ABNORMAL LOW (ref 13.0–17.0)
Lymphocytes Relative: 17 % (ref 12–46)
Lymphs Abs: 1.3 10*3/uL (ref 0.7–4.0)
MCV: 94.4 fL (ref 78.0–100.0)
Monocytes Absolute: 0.4 10*3/uL (ref 0.1–1.0)
Monocytes Relative: 6 % (ref 3–12)
RBC: 3.6 MIL/uL — ABNORMAL LOW (ref 4.22–5.81)
RDW: 16.6 % — ABNORMAL HIGH (ref 11.5–15.5)
WBC: 7.4 10*3/uL (ref 4.0–10.5)

## 2011-10-17 LAB — TSH: TSH: 3.658 u[IU]/mL (ref 0.350–4.500)

## 2011-10-17 NOTE — Patient Instructions (Signed)
Try Claritin or Allegra for the runny nose. The coughing will stop when you stop smoking. Its time to quit smoking

## 2011-10-17 NOTE — Progress Notes (Signed)
  Subjective:    Patient ID: [name], male    DOB: 10/11/1931, 77 y.o.   MRN: [id number]  HPI He is here for medication recheck. Over the last several months he has complained of runny nose especially when he eats. He also complains of a morning cough and slight intermittent hoarse voice it usually clears up as the day goes on. He has started smoking again and I asked him why, he could not give me a good reason. He continues on his thyroid medication. His BPH is causing some slight decreased flow however he is comfortable with this. He does have a history of glucose intolerance. He also has a history of macular degeneration to Testim quite distraught and depressed. He is still dilated but notes that he will not be a will get his license back again.   Review of Systems     Objective:   Physical Exam alert and in no distress. Tympanic membranes and canals are normal. Throat is clear. Tonsils are normal. Neck is supple without adenopathy or thyromegaly. Cardiac exam shows a regular sinus rhythm without murmurs or gallops. Lungs are clear to auscultation. DTRs normal      Assessment & Plan:   1. Gustatory rhinitis    2. Smokers' cough  CBC with Differential, Comprehensive metabolic panel  3. Hypothyroid  TSH  4. BPH (benign prostatic hyperplasia)    5. Glucose intolerance (impaired glucose tolerance)  CBC with Differential, Comprehensive metabolic panel  6. Encounter for long-term (current) use of other medications  CBC with Differential, Comprehensive metabolic panel  7 macular degeneration I recommended an antihistamine for the rhinitis. Discussed need for him to quit smoking which should help with the coughing and his hoarse voice. Routine blood screening including TSH. Discussed his depression over the macular degeneration and at this time is not interested in medications.

## 2011-10-18 MED ORDER — LEVOTHYROXINE SODIUM 88 MCG PO TABS: 88.0000 ug | ORAL_TABLET | Freq: Every day | ORAL | Status: DC

## 2011-10-18 NOTE — Progress Notes (Signed)
Addended by: Denita Lung on: 10/18/2011 08:42 AM   Modules accepted: Orders

## 2011-11-10 ENCOUNTER — Ambulatory Visit: Payer: Medicare Other

## 2012-02-08 ENCOUNTER — Encounter: Payer: Self-pay | Admitting: Internal Medicine

## 2012-02-14 ENCOUNTER — Ambulatory Visit (INDEPENDENT_AMBULATORY_CARE_PROVIDER_SITE_OTHER): Payer: Medicare Other | Admitting: Family Medicine

## 2012-02-14 ENCOUNTER — Encounter: Payer: Self-pay | Admitting: Family Medicine

## 2012-02-14 DIAGNOSIS — F329 Major depressive disorder, single episode, unspecified: Secondary | ICD-10-CM

## 2012-02-14 DIAGNOSIS — E559 Vitamin D deficiency, unspecified: Secondary | ICD-10-CM

## 2012-02-14 DIAGNOSIS — N289 Disorder of kidney and ureter, unspecified: Secondary | ICD-10-CM

## 2012-02-14 LAB — COMPREHENSIVE METABOLIC PANEL
Alkaline Phosphatase: 105 U/L (ref 39–117)
BUN: 26 mg/dL — ABNORMAL HIGH (ref 6–23)
Creat: 1.58 mg/dL — ABNORMAL HIGH (ref 0.50–1.35)
Glucose, Bld: 125 mg/dL — ABNORMAL HIGH (ref 70–99)
Sodium: 139 mEq/L (ref 135–145)
Total Bilirubin: 0.3 mg/dL (ref 0.3–1.2)
Total Protein: 6.1 g/dL (ref 6.0–8.3)

## 2012-02-14 NOTE — Progress Notes (Signed)
  Subjective:    Patient ID: [name], male    DOB: 1930-12-07, 78 y.o.   MRN: [id number]  HPI He is here for followup visit. Review of his record indicates abnormalities so renal function as well as calcium. He has no particular concerns or complaints. Further questioning indicates he does have depression symptoms and upon discussing them with him further, he is not interested in having anything done about this. He still is having difficulty with his gustatory rhinitis. He does continue to smoke. He also has a history of vitamin D deficiency.   Review of Systems     Objective:   Physical Exam Alert and in no distress otherwise not examined       Assessment & Plan:   1. Vitamin d deficiency  Vitamin D 25 hydroxy  2. Renal insufficiency  Comprehensive metabolic panel  3. Depression    4. Hypocalcemia     he's not interested in any counseling or antidepressant medication.

## 2012-02-16 ENCOUNTER — Other Ambulatory Visit: Payer: Self-pay

## 2012-02-16 MED ORDER — ERGOCALCIFEROL 1.25 MG (50000 UT) PO CAPS: 50000.0000 [IU] | ORAL_CAPSULE | ORAL | Status: DC

## 2012-02-16 NOTE — Progress Notes (Signed)
The number in system is a wrong number

## 2012-02-16 NOTE — Telephone Encounter (Signed)
Med sent in but number I have is a wrong number

## 2012-10-21 ENCOUNTER — Other Ambulatory Visit: Payer: Self-pay | Admitting: Family Medicine

## 2012-11-13 ENCOUNTER — Telehealth: Payer: Self-pay | Admitting: Family Medicine

## 2012-11-13 DIAGNOSIS — E039 Hypothyroidism, unspecified: Secondary | ICD-10-CM

## 2012-11-13 MED ORDER — LEVOTHYROXINE SODIUM 88 MCG PO TABS: 88.0000 ug | ORAL_TABLET | Freq: Every day | ORAL | Status: DC

## 2012-11-13 NOTE — Telephone Encounter (Signed)
Done  Rx sent

## 2012-11-27 ENCOUNTER — Encounter: Payer: Self-pay | Admitting: Family Medicine

## 2012-11-27 ENCOUNTER — Ambulatory Visit (INDEPENDENT_AMBULATORY_CARE_PROVIDER_SITE_OTHER): Payer: Medicare Other | Admitting: Family Medicine

## 2012-11-27 VITALS — BP 120/82 | HR 70 | Wt 164.0 lb

## 2012-11-27 DIAGNOSIS — E039 Hypothyroidism, unspecified: Secondary | ICD-10-CM

## 2012-11-27 DIAGNOSIS — Z79899 Other long term (current) drug therapy: Secondary | ICD-10-CM

## 2012-11-27 DIAGNOSIS — R252 Cramp and spasm: Secondary | ICD-10-CM

## 2012-11-27 DIAGNOSIS — J310 Chronic rhinitis: Secondary | ICD-10-CM

## 2012-11-27 LAB — COMPREHENSIVE METABOLIC PANEL
Albumin: 4.3 g/dL (ref 3.5–5.2)
BUN: 26 mg/dL — ABNORMAL HIGH (ref 6–23)
CO2: 17 mEq/L — ABNORMAL LOW (ref 19–32)
Calcium: 6.5 mg/dL — ABNORMAL LOW (ref 8.4–10.5)
Chloride: 114 mEq/L — ABNORMAL HIGH (ref 96–112)
Creat: 1.93 mg/dL — ABNORMAL HIGH (ref 0.50–1.35)
Potassium: 4.3 mEq/L (ref 3.5–5.3)

## 2012-11-27 LAB — CBC WITH DIFFERENTIAL/PLATELET
Eosinophils Relative: 5 % (ref 0–5)
HCT: 32.9 % — ABNORMAL LOW (ref 39.0–52.0)
Hemoglobin: 11.2 g/dL — ABNORMAL LOW (ref 13.0–17.0)
Lymphocytes Relative: 26 % (ref 12–46)
Lymphs Abs: 1.5 10*3/uL (ref 0.7–4.0)
MCV: 89.9 fL (ref 78.0–100.0)
Monocytes Absolute: 0.5 10*3/uL (ref 0.1–1.0)
Monocytes Relative: 8 % (ref 3–12)
Neutro Abs: 3.6 10*3/uL (ref 1.7–7.7)
RBC: 3.66 MIL/uL — ABNORMAL LOW (ref 4.22–5.81)
WBC: 5.9 10*3/uL (ref 4.0–10.5)

## 2012-11-27 LAB — TSH: TSH: 4.625 u[IU]/mL — ABNORMAL HIGH (ref 0.350–4.500)

## 2012-11-27 NOTE — Progress Notes (Signed)
  Subjective:    Patient ID: Christian Daft., male    DOB: 1931/11/03, 79 y.o.   MRN: [id number]  HPI He is here for medication management visit. He does have hypothyroid and does need followup on this. He also claims of a vague history of waking up with intermittent sore throat as well as hoarse voice. He then states that he will be unable to breathe through his nose but states he can follow sleep breathing normally but will wake up with his mouth open. He does not necessarily complain of having an acid taste in his mouth. He does not have a history of sneezing, itchy watery eyes, postnasal drainage but does complain of rhinorrhea with eating. He also complains of a several month history of muscle cramping in arms and legs. Very difficult to get a good history from him. He apparently has not really tried any for his cramping.   Review of Systems     Objective:   Physical Exam alert and in no distress. Tympanic membranes and canals are normal. Throat is clear. Tonsils are normal. Neck is supple without adenopathy or thyromegaly. Cardiac exam shows a regular sinus rhythm without murmurs or gallops. Lungs are clear to auscultation. No palpable tenderness to his arms or legs. DTRs normal.       Assessment & Plan:   1. Gustatory rhinitis    2. Muscle cramps  CBC with Differential, Comprehensive metabolic panel  3. Hypothyroid  TSH  4. Encounter for long-term (current) use of other medications  CBC with Differential, Comprehensive metabolic panel, TSH   recommend antihistamine for his rhinitis. Take 2 Prilosec about a half an hour before bedtime for the next 2 weeks and let me know how it works to help with the symptoms. If no improvement, we will refer you to ENT.

## 2012-11-27 NOTE — Patient Instructions (Signed)
Take 2 Prilosec about a half an hour before bedtime for the next 2 weeks and let me know how it works to help with the symptoms. If no improvement, we will refer you to ENT.

## 2012-11-29 NOTE — Progress Notes (Signed)
Quick Note:  Called pt left message for pt to call and make apt to come back in to see Dr.Lalonde for additional blood test and an EKG ______

## 2012-12-03 ENCOUNTER — Encounter (INDEPENDENT_AMBULATORY_CARE_PROVIDER_SITE_OTHER): Payer: Medicare Other | Admitting: Ophthalmology

## 2012-12-03 DIAGNOSIS — I10 Essential (primary) hypertension: Secondary | ICD-10-CM

## 2012-12-03 DIAGNOSIS — H43819 Vitreous degeneration, unspecified eye: Secondary | ICD-10-CM

## 2012-12-03 DIAGNOSIS — H2510 Age-related nuclear cataract, unspecified eye: Secondary | ICD-10-CM

## 2012-12-03 DIAGNOSIS — H35039 Hypertensive retinopathy, unspecified eye: Secondary | ICD-10-CM

## 2012-12-03 DIAGNOSIS — H353290 Exudative age-related macular degeneration, unspecified eye, stage unspecified: Secondary | ICD-10-CM

## 2012-12-03 DIAGNOSIS — H3530 Unspecified macular degeneration: Secondary | ICD-10-CM

## 2012-12-03 DIAGNOSIS — E1165 Type 2 diabetes mellitus with hyperglycemia: Secondary | ICD-10-CM

## 2012-12-03 DIAGNOSIS — E11319 Type 2 diabetes mellitus with unspecified diabetic retinopathy without macular edema: Secondary | ICD-10-CM

## 2012-12-06 ENCOUNTER — Ambulatory Visit (INDEPENDENT_AMBULATORY_CARE_PROVIDER_SITE_OTHER): Payer: Medicare Other | Admitting: Ophthalmology

## 2012-12-06 ENCOUNTER — Encounter: Payer: Self-pay | Admitting: Family Medicine

## 2012-12-06 ENCOUNTER — Ambulatory Visit (INDEPENDENT_AMBULATORY_CARE_PROVIDER_SITE_OTHER): Payer: Medicare Other | Admitting: Family Medicine

## 2012-12-06 DIAGNOSIS — R252 Cramp and spasm: Secondary | ICD-10-CM

## 2012-12-06 DIAGNOSIS — N289 Disorder of kidney and ureter, unspecified: Secondary | ICD-10-CM

## 2012-12-06 DIAGNOSIS — H353290 Exudative age-related macular degeneration, unspecified eye, stage unspecified: Secondary | ICD-10-CM

## 2012-12-06 DIAGNOSIS — E1139 Type 2 diabetes mellitus with other diabetic ophthalmic complication: Secondary | ICD-10-CM

## 2012-12-06 DIAGNOSIS — E11319 Type 2 diabetes mellitus with unspecified diabetic retinopathy without macular edema: Secondary | ICD-10-CM

## 2012-12-06 DIAGNOSIS — H3530 Unspecified macular degeneration: Secondary | ICD-10-CM

## 2012-12-06 DIAGNOSIS — H43819 Vitreous degeneration, unspecified eye: Secondary | ICD-10-CM

## 2012-12-06 DIAGNOSIS — H2510 Age-related nuclear cataract, unspecified eye: Secondary | ICD-10-CM

## 2012-12-06 DIAGNOSIS — I10 Essential (primary) hypertension: Secondary | ICD-10-CM

## 2012-12-06 NOTE — Progress Notes (Signed)
  Subjective:    Patient ID: Christian Daft., male    DOB: 02-Apr-1931, 79 y.o.   MRN: [id number]  HPI He is here for recheck. Review his blood work does show renal insufficiency as well as low calcium level. He has had low calcium levels on the last few visits. He does complain of muscle cramping. He has a previous history of obstructive uropathy and subsequent renal insufficiency from this. This occurred several years ago when he was living in [place]. He has no other concerns or complaints.   Review of Systems     Objective:   Physical Exam Alert and in no distress otherwise not examined       Assessment & Plan:   1. Hypocalcemia  EKG 12-Lead, Vitamin D 25 hydroxy, Magnesium, PTH, intact and calcium, Phosphorus  2. Renal insufficiency    3. Muscle cramps  Vitamin D 25 hydroxy, Magnesium, PTH, intact and calcium, Phosphorus   followup pending results of the above evaluation.

## 2012-12-07 LAB — PHOSPHORUS: Phosphorus: 5.8 mg/dL — ABNORMAL HIGH (ref 2.3–4.6)

## 2012-12-07 LAB — PTH, INTACT AND CALCIUM: Calcium, Total (PTH): 6.1 mg/dL — CL (ref 8.4–10.5)

## 2012-12-10 ENCOUNTER — Other Ambulatory Visit: Payer: Self-pay

## 2012-12-10 MED ORDER — VITAMIN D (ERGOCALCIFEROL) 1.25 MG (50000 UNIT) PO CAPS: 50000.0000 [IU] | ORAL_CAPSULE | ORAL | Status: DC

## 2012-12-10 NOTE — Progress Notes (Signed)
Quick Note:  CALLED [facility] left message for his son Lennette Bihari to call me back .Lennette Bihari has called me back and he is aware of med called in and dad needs to call us and make apt 8 weeks from now Lennette Bihari said he would contact his dad and will have him call and make apt ______

## 2012-12-10 NOTE — Progress Notes (Signed)
Quick Note:  Let him know that his Vit D level is low and probably causing the muscle trouble.50000units weekly and recheck his level in 8 weeks ______

## 2012-12-23 ENCOUNTER — Other Ambulatory Visit: Payer: Self-pay | Admitting: Family Medicine

## 2012-12-26 ENCOUNTER — Encounter (INDEPENDENT_AMBULATORY_CARE_PROVIDER_SITE_OTHER): Payer: Self-pay | Admitting: Ophthalmology

## 2013-01-02 ENCOUNTER — Encounter: Payer: Self-pay | Admitting: Medical

## 2013-01-02 ENCOUNTER — Ambulatory Visit (INDEPENDENT_AMBULATORY_CARE_PROVIDER_SITE_OTHER): Payer: Medicare Other | Admitting: Medical

## 2013-01-02 ENCOUNTER — Emergency Department (HOSPITAL_COMMUNITY): Payer: Medicare Other

## 2013-01-02 ENCOUNTER — Inpatient Hospital Stay (HOSPITAL_COMMUNITY)
Admission: EM | Admit: 2013-01-02 | Discharge: 2013-01-08 | DRG: 504 | Disposition: A | Discharge status: Home Health | Payer: Medicare Other | Attending: Internal Medicine | Admitting: Internal Medicine

## 2013-01-02 ENCOUNTER — Encounter (INDEPENDENT_AMBULATORY_CARE_PROVIDER_SITE_OTHER): Payer: Medicare Other | Admitting: Ophthalmology

## 2013-01-02 ENCOUNTER — Encounter (HOSPITAL_COMMUNITY): Payer: Self-pay

## 2013-01-02 VITALS — BP 130/68 | HR 60 | Temp 98.6°F | Resp 14 | Wt 164.0 lb

## 2013-01-02 DIAGNOSIS — N179 Acute kidney failure, unspecified: Secondary | ICD-10-CM | POA: Diagnosis present

## 2013-01-02 DIAGNOSIS — E8729 Other acidosis: Secondary | ICD-10-CM | POA: Diagnosis present

## 2013-01-02 DIAGNOSIS — H543 Unqualified visual loss, both eyes: Secondary | ICD-10-CM | POA: Diagnosis present

## 2013-01-02 DIAGNOSIS — L0390 Cellulitis, unspecified: Secondary | ICD-10-CM

## 2013-01-02 DIAGNOSIS — N184 Chronic kidney disease, stage 4 (severe): Secondary | ICD-10-CM | POA: Diagnosis present

## 2013-01-02 DIAGNOSIS — R600 Localized edema: Secondary | ICD-10-CM

## 2013-01-02 DIAGNOSIS — H43819 Vitreous degeneration, unspecified eye: Secondary | ICD-10-CM

## 2013-01-02 DIAGNOSIS — E872 Acidosis, unspecified: Secondary | ICD-10-CM

## 2013-01-02 DIAGNOSIS — D649 Anemia, unspecified: Secondary | ICD-10-CM

## 2013-01-02 DIAGNOSIS — E039 Hypothyroidism, unspecified: Secondary | ICD-10-CM | POA: Diagnosis present

## 2013-01-02 DIAGNOSIS — R7301 Impaired fasting glucose: Secondary | ICD-10-CM

## 2013-01-02 DIAGNOSIS — H3530 Unspecified macular degeneration: Secondary | ICD-10-CM

## 2013-01-02 DIAGNOSIS — N289 Disorder of kidney and ureter, unspecified: Secondary | ICD-10-CM

## 2013-01-02 DIAGNOSIS — H35039 Hypertensive retinopathy, unspecified eye: Secondary | ICD-10-CM

## 2013-01-02 DIAGNOSIS — H353290 Exudative age-related macular degeneration, unspecified eye, stage unspecified: Secondary | ICD-10-CM

## 2013-01-02 DIAGNOSIS — R7309 Other abnormal glucose: Secondary | ICD-10-CM

## 2013-01-02 DIAGNOSIS — L02619 Cutaneous abscess of unspecified foot: Secondary | ICD-10-CM

## 2013-01-02 DIAGNOSIS — M869 Osteomyelitis, unspecified: Principal | ICD-10-CM

## 2013-01-02 DIAGNOSIS — R739 Hyperglycemia, unspecified: Secondary | ICD-10-CM

## 2013-01-02 DIAGNOSIS — I10 Essential (primary) hypertension: Secondary | ICD-10-CM

## 2013-01-02 DIAGNOSIS — N183 Chronic kidney disease, stage 3 unspecified: Secondary | ICD-10-CM

## 2013-01-02 DIAGNOSIS — H2510 Age-related nuclear cataract, unspecified eye: Secondary | ICD-10-CM

## 2013-01-02 DIAGNOSIS — N400 Enlarged prostate without lower urinary tract symptoms: Secondary | ICD-10-CM | POA: Diagnosis present

## 2013-01-02 DIAGNOSIS — Z79899 Other long term (current) drug therapy: Secondary | ICD-10-CM

## 2013-01-02 DIAGNOSIS — E871 Hypo-osmolality and hyponatremia: Secondary | ICD-10-CM

## 2013-01-02 DIAGNOSIS — D72829 Elevated white blood cell count, unspecified: Secondary | ICD-10-CM | POA: Diagnosis present

## 2013-01-02 DIAGNOSIS — L03115 Cellulitis of right lower limb: Secondary | ICD-10-CM | POA: Diagnosis present

## 2013-01-02 DIAGNOSIS — R7303 Prediabetes: Secondary | ICD-10-CM | POA: Diagnosis present

## 2013-01-02 DIAGNOSIS — R609 Edema, unspecified: Secondary | ICD-10-CM

## 2013-01-02 DIAGNOSIS — N2581 Secondary hyperparathyroidism of renal origin: Secondary | ICD-10-CM | POA: Diagnosis present

## 2013-01-02 DIAGNOSIS — L03119 Cellulitis of unspecified part of limb: Secondary | ICD-10-CM

## 2013-01-02 HISTORY — DX: Adverse effect of unspecified anesthetic, initial encounter: T41.45XA

## 2013-01-02 HISTORY — DX: Other complications of anesthesia, initial encounter: T88.59XA

## 2013-01-02 LAB — CBC WITH DIFFERENTIAL/PLATELET
Basophils Relative: 0 % (ref 0–1)
Eosinophils Absolute: 0 10*3/uL (ref 0.0–0.7)
Eosinophils Relative: 0 % (ref 0–5)
HCT: 30.4 % — ABNORMAL LOW (ref 39.0–52.0)
Hemoglobin: 10.3 g/dL — ABNORMAL LOW (ref 13.0–17.0)
MCH: 31 pg (ref 26.0–34.0)
MCHC: 33.9 g/dL (ref 30.0–36.0)
MCV: 91.6 fL (ref 78.0–100.0)
Monocytes Absolute: 0.8 10*3/uL (ref 0.1–1.0)
Monocytes Relative: 5 % (ref 3–12)
Neutro Abs: 13.4 10*3/uL — ABNORMAL HIGH (ref 1.7–7.7)

## 2013-01-02 LAB — BASIC METABOLIC PANEL
BUN: 47 mg/dL — ABNORMAL HIGH (ref 6–23)
Calcium: 5.9 mg/dL — CL (ref 8.4–10.5)
Chloride: 100 mEq/L (ref 96–112)
Creatinine, Ser: 2.15 mg/dL — ABNORMAL HIGH (ref 0.50–1.35)
GFR calc Af Amer: 32 mL/min — ABNORMAL LOW (ref >90)

## 2013-01-02 LAB — HEPATIC FUNCTION PANEL
ALT: 13 U/L (ref 0–53)
Alkaline Phosphatase: 72 U/L (ref 39–117)
Bilirubin, Direct: 0.2 mg/dL (ref 0.0–0.3)
Indirect Bilirubin: 0.2 mg/dL — ABNORMAL LOW (ref 0.3–0.9)

## 2013-01-02 MED ORDER — VANCOMYCIN HCL IN DEXTROSE 1-5 GM/200ML-% IV SOLN
1000.0000 mg | Freq: Once | INTRAVENOUS | Status: AC
  Administered 2013-01-02: 1000 mg via INTRAVENOUS
  Filled 2013-01-02: qty 200

## 2013-01-02 MED ORDER — POLYMYXIN B-TRIMETHOPRIM 10000-0.1 UNIT/ML-% OP SOLN
1.0000 [drp] | Freq: Four times a day (QID) | OPHTHALMIC | Status: DC
  Administered 2013-01-02 – 2013-01-04 (×4): 1 [drp] via OPHTHALMIC
  Filled 2013-01-02: qty 10

## 2013-01-02 MED ORDER — ONDANSETRON HCL 4 MG PO TABS: 4.0000 mg | ORAL_TABLET | Freq: Four times a day (QID) | ORAL | Status: DC | PRN

## 2013-01-02 MED ORDER — SODIUM CHLORIDE 0.9 % IV SOLN
1.0000 g | Freq: Once | INTRAVENOUS | Status: AC
  Administered 2013-01-02: 1 g via INTRAVENOUS
  Filled 2013-01-02: qty 10

## 2013-01-02 MED ORDER — HYDROCODONE-ACETAMINOPHEN 5-325 MG PO TABS: 1.0000 | ORAL_TABLET | Freq: Four times a day (QID) | ORAL | Status: DC | PRN

## 2013-01-02 MED ORDER — PIPERACILLIN-TAZOBACTAM 3.375 G IVPB
3.3750 g | Freq: Three times a day (TID) | INTRAVENOUS | Status: DC
Start: 2013-01-02 — End: 2013-01-08
  Administered 2013-01-03 – 2013-01-08 (×17): 3.375 g via INTRAVENOUS
  Filled 2013-01-02 (×21): qty 50

## 2013-01-02 MED ORDER — ENOXAPARIN SODIUM 30 MG/0.3ML ~~LOC~~ SOLN
30.0000 mg | SUBCUTANEOUS | Status: DC
  Administered 2013-01-03: 30 mg via SUBCUTANEOUS
  Filled 2013-01-02 (×7): qty 0.3

## 2013-01-02 MED ORDER — VANCOMYCIN HCL 500 MG IV SOLR
500.0000 mg | INTRAVENOUS | Status: DC
  Administered 2013-01-03 – 2013-01-08 (×6): 500 mg via INTRAVENOUS
  Filled 2013-01-02 (×6): qty 500

## 2013-01-02 MED ORDER — SODIUM BICARBONATE 650 MG PO TABS
1300.0000 mg | ORAL_TABLET | Freq: Two times a day (BID) | ORAL | Status: DC
  Administered 2013-01-02 – 2013-01-08 (×11): 1300 mg via ORAL
  Filled 2013-01-02 (×14): qty 2

## 2013-01-02 MED ORDER — ADULT MULTIVITAMIN W/MINERALS CH
1.0000 | ORAL_TABLET | Freq: Every day | ORAL | Status: DC
  Administered 2013-01-04 – 2013-01-08 (×5): 1 via ORAL
  Filled 2013-01-02 (×6): qty 1

## 2013-01-02 MED ORDER — ENOXAPARIN SODIUM 40 MG/0.4ML ~~LOC~~ SOLN: 40.0000 mg | SUBCUTANEOUS | Status: DC

## 2013-01-02 MED ORDER — ONDANSETRON HCL 4 MG/2ML IJ SOLN: 4.0000 mg | Freq: Four times a day (QID) | INTRAMUSCULAR | Status: DC | PRN

## 2013-01-02 MED ORDER — SODIUM CHLORIDE 0.9 % IV SOLN: INTRAVENOUS | Status: DC

## 2013-01-02 MED ORDER — ACETAMINOPHEN 325 MG PO TABS: 650.0000 mg | ORAL_TABLET | Freq: Four times a day (QID) | ORAL | Status: DC | PRN

## 2013-01-02 MED ORDER — SODIUM CHLORIDE 0.9 % IV SOLN
Freq: Once | INTRAVENOUS | Status: AC
  Administered 2013-01-02: 14:00:00 via INTRAVENOUS

## 2013-01-02 MED ORDER — PIPERACILLIN-TAZOBACTAM 3.375 G IVPB
3.3750 g | Freq: Once | INTRAVENOUS | Status: AC
  Administered 2013-01-02: 3.375 g via INTRAVENOUS
  Filled 2013-01-02: qty 50

## 2013-01-02 MED ORDER — VITAMIN D (ERGOCALCIFEROL) 1.25 MG (50000 UNIT) PO CAPS
50000.0000 [IU] | ORAL_CAPSULE | ORAL | Status: DC
  Administered 2013-01-07: 50000 [IU] via ORAL
  Filled 2013-01-02: qty 1

## 2013-01-02 MED ORDER — POLYMYXIN B-TRIMETHOPRIM 10000-0.1 UNIT/ML-% OP SOLN
1.0000 [drp] | Freq: Four times a day (QID) | OPHTHALMIC | Status: DC
  Filled 2013-01-02: qty 10

## 2013-01-02 MED ORDER — CALCITRIOL 0.25 MCG PO CAPS
0.2500 ug | ORAL_CAPSULE | Freq: Every day | ORAL | Status: DC
  Administered 2013-01-02 – 2013-01-08 (×6): 0.25 ug via ORAL
  Filled 2013-01-02 (×7): qty 1

## 2013-01-02 MED ORDER — SODIUM BICARBONATE 8.4 % IV SOLN
INTRAVENOUS | Status: DC
  Filled 2013-01-02: qty 100

## 2013-01-02 MED ORDER — LEVOTHYROXINE SODIUM 88 MCG PO TABS
88.0000 ug | ORAL_TABLET | Freq: Every day | ORAL | Status: DC
  Administered 2013-01-03 – 2013-01-08 (×6): 88 ug via ORAL
  Filled 2013-01-02 (×7): qty 1

## 2013-01-02 MED ORDER — ONDANSETRON HCL 4 MG/2ML IJ SOLN: 4.0000 mg | Freq: Three times a day (TID) | INTRAMUSCULAR | Status: AC | PRN

## 2013-01-02 MED ORDER — CALCIUM CARBONATE ANTACID 500 MG PO CHEW
1.0000 | CHEWABLE_TABLET | Freq: Three times a day (TID) | ORAL | Status: DC
  Administered 2013-01-03 – 2013-01-08 (×14): 200 mg via ORAL
  Filled 2013-01-02 (×19): qty 1

## 2013-01-02 MED ORDER — ACETAMINOPHEN 650 MG RE SUPP: 650.0000 mg | Freq: Four times a day (QID) | RECTAL | Status: DC | PRN

## 2013-01-02 MED ORDER — SODIUM BICARBONATE 8.4 % IV SOLN: INTRAVENOUS | Status: DC

## 2013-01-02 NOTE — Progress Notes (Addendum)
Subjective: [name] is an 79yo white male here with his son today.  He lives with his soon.  Here for possible toe infection/foot infection.  He has a history of borderline diabetes, renal insufficiency, somewhat poor eyesight, and hypothyroidism.  He notes that he thinks he caught the right 2nd toe on something with the nail on Friday 6 days ago.  The toenail tends to catch on stuff since its longer than his great toe.  Thinks it caught on something and the nail ripped loose.  Son tried to place the nail back on the right second toe.  Now the toe and foot is red and swollen and warm.  Got much worse the last few days.  No prior similar redness and swelling.   He does report chills, but no fever. No nausea and vomiting.  Feeling pain from toe up to above knee.   Doesn't have good feeling in the foot to start with.     Allergies  Allergen Reactions  . Aspirin     Current Outpatient Prescriptions on File Prior to Visit  Medication Sig Dispense Refill  . levothyroxine (SYNTHROID, LEVOTHROID) 88 MCG tablet TAKE 1 TABLET BY MOUTH DAILY  30 tablet  2  . Multiple Vitamins-Minerals (MULTIVITAMIN WITH MINERALS) tablet Take 1 tablet by mouth daily.      . Vitamin D, Ergocalciferol, (DRISDOL) 50000 UNITS CAPS Take 1 capsule (50,000 Units total) by mouth every 7 (seven) days.  4 capsule  1  . triamcinolone (KENALOG) 0.1 % cream APPLY TO THE AFFECTED AREA AS DIRECTED  454 g  0   No current facility-administered medications on file prior to visit.    Past Medical History  Diagnosis Date  . Hypothyroid   . BPH (benign prostatic hyperplasia)   . Renal insufficiency   . Glucose intolerance (impaired glucose tolerance)     History reviewed. No pertinent past surgical history.  No family history on file.  History   Social History  . Marital Status: Single    Spouse Name: N/A    Number of Children: N/A  . Years of Education: N/A   Occupational History  . Not on file.   Social History Main  Topics  . Smoking status: Former Smoker -- 0.30 packs/day  . Smokeless tobacco: Not on file  . Alcohol Use: No  . Drug Use: No  . Sexually Active: Not on file   Other Topics Concern  . Not on file   Social History Narrative  . No narrative on file   Reviewed their medical, surgical, family, social, medication, and allergy history and updated chart as appropriate.  Gen: no fever, +chills, no sweats GI no NVD Neuro: decreased foot sensation, +pain in right leg in general  Objective: Gen: wd, wn, nad, somewhat hard of hearing, white male Skin: right 2nd toe and dorsal foot distal 1/3 with erythema, but foot in general with surrounding erythema. There is a piece of tape over the 2nd toenail, but end of toe appears to possibly have some purulent debris.  There seems to be some tinea pedis in between toes of right foot as well. MSK: right 2nd toe swollen, generalized erythema, tender to touch, decreased ROM, indurated skin along base of toe at 2nd toe and distal foot induration in same area.   Neuro: seems to be decreased sensation of foot and toe in general to both light and sharp touch Pulses: right pedal pulses barely palpable but present, cap refill 2+ Ext: generalized swelling of  rihgt lower leg from mid calf to foot and 2nd toe, asymmetric compared to left leg, no cyanosis Heart: RRR but with either occasional ectopic beat or possible S3, no murmurs, no rubs Lungs: clear  Assessment: Encounter Diagnoses  Name Primary?  . Cellulitis Yes  . Leg edema, right   . Renal insufficiency   . Impaired fasting glucose   . Anemia    Plan: Given appearence of leg/foot, underlying comorbidities, age, I advised he report to [facility] ED for further eval and management of cellulitis  Called [facility] ED triage to advise of his condition and he is on the way by personal vehicle.

## 2013-01-02 NOTE — ED Provider Notes (Signed)
History     CSN: [id number]  Arrival date & time 01/02/13  1204   First MD Initiated Contact with Patient 01/02/13 1216      Chief Complaint  Patient presents with  . Foot Pain    foot infection    (Consider location/radiation/quality/duration/timing/severity/associated sxs/prior treatment) HPI Comments: Pt presents to the ED for right 2nd toe infection.  States he hit his foot about a week ago but did not notice any injury due to decreased sensation, not of new onset.  Began to notice some swelling so his son bandaged his toe for him.  Since then, has had increased swelling and erythema which has now migrated into his foot.  Was seen by PCP this morning who encouraged him to come to ED for further evaluation and possible IV abx.  Had a similar episode about 7-8 years ago.  Pt is pre-diabetic with known neuropathy.  Denies any chest pain, SOB, nausea, vomiting, or fever.  The history is provided by the patient.    Past Medical History  Diagnosis Date  . Hypothyroid   . BPH (benign prostatic hyperplasia)   . Renal insufficiency   . Glucose intolerance (impaired glucose tolerance)     History reviewed. No pertinent past surgical history.  No family history on file.  History  Substance Use Topics  . Smoking status: Former Smoker -- 0.30 packs/day  . Smokeless tobacco: Not on file  . Alcohol Use: No      Review of Systems  Skin: Positive for wound.  All other systems reviewed and are negative.    Allergies  Aspirin  Home Medications   Current Outpatient Rx  Name  Route  Sig  Dispense  Refill  . levothyroxine (SYNTHROID, LEVOTHROID) 88 MCG tablet   Oral   Take 88 mcg by mouth every morning.         . Multiple Vitamins-Minerals (MULTIVITAMIN WITH MINERALS) tablet   Oral   Take 1 tablet by mouth daily.         Marland Kitchen triamcinolone cream (KENALOG) 0.1 %   Topical   Apply 1 application topically daily as needed (Applies to affected area.).         Marland Kitchen  trimethoprim-polymyxin b (POLYTRIM) ophthalmic solution   Left Eye   Place 1 drop into the left eye 4 (four) times daily.         . Vitamin D, Ergocalciferol, (DRISDOL) 50000 UNITS CAPS   Oral   Take 50,000 Units by mouth every Monday.           BP 177/84  Pulse 84  Temp(Src) 98.9 F (37.2 C) (Oral)  Resp 20  SpO2 100%  Physical Exam  Nursing note and vitals reviewed. Constitutional: He is oriented to person, place, and time. He appears well-developed and well-nourished. No distress.  HENT:  Head: Normocephalic and atraumatic.  Eyes: Conjunctivae and EOM are normal.  Neck: Normal range of motion.  Cardiovascular: Normal rate.  An irregular rhythm present.  Murmur heard. Rumble- Aortic valve replacement  Pulmonary/Chest: Effort normal and breath sounds normal. No respiratory distress.  Musculoskeletal:       Right foot: He exhibits swelling and deformity (hammer toe). He exhibits normal range of motion.       Feet:  Infected 2nd toenail with pus draining.  Cellulitis present throughout toe and extending mid-foot. Decreased sensation throughout foot and toes, not of new onset.  Neurological: He is alert and oriented to person, place, and time.  Skin:  Skin grossly normal with exception of right foot  Psychiatric: He has a normal mood and affect.    ED Course  Procedures (including critical care time)  Labs Reviewed  CBC WITH DIFFERENTIAL - Abnormal; Notable for the following:    WBC 14.8 (*)    RBC 3.32 (*)    Hemoglobin 10.3 (*)    HCT 30.4 (*)    Neutrophils Relative 90 (*)    Neutro Abs 13.4 (*)    Lymphocytes Relative 4 (*)    Lymphs Abs 0.6 (*)    All other components within normal limits  BASIC METABOLIC PANEL - Abnormal; Notable for the following:    Sodium 131 (*)    CO2 14 (*)    Glucose, Bld 152 (*)    BUN 47 (*)    Creatinine, Ser 2.15 (*)    Calcium 5.9 (*)    GFR calc non Af Amer 27 (*)    GFR calc Af Amer 32 (*)    All other components  within normal limits  HEPATIC FUNCTION PANEL - Abnormal; Notable for the following:    Albumin 2.9 (*)    Indirect Bilirubin 0.2 (*)    All other components within normal limits  HEMOGLOBIN A1C   Dg Toe 2nd Right  01/02/2013  *RADIOLOGY REPORT*  Clinical Data: Nonhealing wound  RIGTH SECOND TOE  Comparison: None.  Findings: Three views right second toe submitted. There is bone destruction with erosive appearance at the tip of distal phalanx. There is associated soft tissue swelling with small amount of soft tissue air.  Findings are highly suspicious for osteomyelitis. Clinical correlation is necessary.  IMPRESSION: Findings suspicious for osteomyelitis at the tip of distal phalanx right second toe.   Original Report Authenticated By: Lahoma Crocker, M.D.      1. Osteomyelitis   2. Cellulitis   3. Hypocalcemia   4. Hyperglycemia   5. Renal insufficiency   6. Hyponatremia       MDM  12:50 PM Pt evaluated.  Large infection with obvious cellulitis.  Dr. Lucy Chris evaluated pt and removed large portion of toenail.  Will get x-ray to r/o osteomyelitis.  CBC pending.  Vanc and Zosyn IV started.   2:06 PM X-ray revealed possible osteomyelitis.  Will need hospital admission.  4:22 PM Pt admitted to med/surg floor at [facility].  Temporary admit orders placed.  Ortho has been consulted and assessed pt.     Larene Pickett, PA-C 01/02/13 1945

## 2013-01-02 NOTE — H&P (Addendum)
[facility] History and Physical  [name]. [id number] DOB: 08-16-1931 DOA: 01/02/2013  Referring physician: EDP PCP: Wyatt Haste, MD   Chief Complaint: R foot pain and swelling  HPI: [name]. is a 79 y.o. male with past history of CKD3, hypothyroidism, BPH presents from home with his son with the above-mentioned complaint. He states that he may have caught his right toenail on something sharp about 6 days ago and nail ripped loose, subsequent to that he noticed increased pain and swelling involving the second toe, this progressed today and spread to the entire foot and lower leg primarily with erythema and swelling. He went to his PCPs office and was subsequently referred to the emergency room. Upon evaluation in the ER noted to have worsening of his CKD and  metabolic acidosis and leukocytosis along with cellulitis of the right foot. He denies any fevers, reports chills  Review of Systems: The patient denies anorexia, fever, weight loss,, vision loss, decreased hearing, hoarseness, chest pain, syncope, dyspnea on exertion, peripheral edema, balance deficits, hemoptysis, abdominal pain, melena, hematochezia, severe indigestion/heartburn, hematuria, incontinence, genital sores, muscle weakness, suspicious skin lesions, transient blindness, difficulty walking, depression, unusual weight change, abnormal bleeding, enlarged lymph nodes, angioedema, and breast masses.    Past Medical History  Diagnosis Date  . Hypothyroid   . BPH (benign prostatic hyperplasia)   . Renal insufficiency   . Glucose intolerance (impaired glucose tolerance)   . Complication of anesthesia     "a tough time"  -pt unable to explain   Past Surgical History  Procedure Laterality Date  . Abdominal aortic aneurysm repair    . Prostate surgery     Social History:  reports that he quit smoking about 3 months ago. He has never used smokeless tobacco. He reports that he does not drink  alcohol or use illicit drugs.  lives at home with his son   Allergies  Allergen Reactions  . Aspirin      family history: reviewed, parents deceased from unknown medical cause per pt  Prior to Admission medications   Medication Sig Start Date End Date Taking? Authorizing Provider  levothyroxine (SYNTHROID, LEVOTHROID) 88 MCG tablet Take 88 mcg by mouth every morning.   Yes Historical Provider, MD  Multiple Vitamins-Minerals (MULTIVITAMIN WITH MINERALS) tablet Take 1 tablet by mouth daily.   Yes Historical Provider, MD  triamcinolone cream (KENALOG) 0.1 % Apply 1 application topically daily as needed (Applies to affected area.).   Yes Historical Provider, MD  trimethoprim-polymyxin b (POLYTRIM) ophthalmic solution Place 1 drop into the left eye 4 (four) times daily.   Yes Historical Provider, MD  Vitamin D, Ergocalciferol, (DRISDOL) 50000 UNITS CAPS Take 50,000 Units by mouth every Monday. 12/10/12   Denita Lung, MD   Physical Exam: Filed Vitals:   01/02/13 1211 01/02/13 1353 01/02/13 1624  BP: 177/84  140/48  Pulse: 84  71  Temp: 98.9 F (37.2 C)  99.6 F (37.6 C)  TempSrc: Oral  Oral  Resp: 20  20  Height:  5\' 11"  (1.803 m)   SpO2: 100%  99%     General:  AAOx3, no distress  HEENT: PERRLA, EOMI  CVS: S1-S2 regular rate rhythm no murmurs rubs or gallops  Respiratory: Clear to auscultation bilaterally  Abdomen: Soft nontender nondistended with normal bowel sounds no organomegaly  Extremity : Right foot  with erythema swelling and tenderness involving the entire right foot with red streaks going up the lower legs, increased  erythema, tenderness of second toe and skin breakdown along the base of the second toe   Skin: No rashes or skin breakdown it except involving the right foot and lower leg   Psychiatric: Irritable, otherwise appropriate mood and affect  Neurologic: Nonfocal  Labs on Admission:  Basic Metabolic Panel:  Recent Labs Lab 01/02/13 1435  NA 131*   K 3.9  CL 100  CO2 14*  GLUCOSE 152*  BUN 47*  CREATININE 2.15*  CALCIUM 5.9*   Liver Function Tests: No results found for this basename: AST, ALT, ALKPHOS, BILITOT, PROT, ALBUMIN,  in the last 168 hours No results found for this basename: LIPASE, AMYLASE,  in the last 168 hours No results found for this basename: AMMONIA,  in the last 168 hours CBC:  Recent Labs Lab 01/02/13 1351  WBC 14.8*  NEUTROABS 13.4*  HGB 10.3*  HCT 30.4*  MCV 91.6  PLT 203   Cardiac Enzymes: No results found for this basename: CKTOTAL, CKMB, CKMBINDEX, TROPONINI,  in the last 168 hours  BNP (last 3 results) No results found for this basename: PROBNP,  in the last 8760 hours CBG: No results found for this basename: GLUCAP,  in the last 168 hours  Radiological Exams on Admission: Dg Toe 2nd Right  01/02/2013  *RADIOLOGY REPORT*  Clinical Data: Nonhealing wound  RIGTH SECOND TOE  Comparison: None.  Findings: Three views right second toe submitted. There is bone destruction with erosive appearance at the tip of distal phalanx. There is associated soft tissue swelling with small amount of soft tissue air.  Findings are highly suspicious for osteomyelitis. Clinical correlation is necessary.  IMPRESSION: Findings suspicious for osteomyelitis at the tip of distal phalanx right second toe.   Original Report Authenticated By: Lahoma Crocker, M.D.     EKG: Assessment/Plan Active Problems:   Hypothyroid   BPH (benign prostatic hyperplasia)   Borderline diabetes mellitus   Cellulitis of foot, right   CKD (chronic kidney disease), stage III   Metabolic acidosis, increased anion gap   1. Cellulitis of right foot , with suspected osteomyelitis of second toe  -  Admit to med surg bed,  -  Empiric IV vancomycin/Zosyn, Orthopedic consult requested Dr.Hweitt to eval for toe amputation. -  Elevate right foot  2. ARF on CKD 3-4: Creatinine close to baseline, it was 1.9 a month ago - With anion gap metabolic  acidosis which is worsening, his bicarbonate was 17 in January and 19 before that. - Discussed with Dr. Mercy Moore renal, since he will be getting IV fluids ,I will add bicarbonate to that now for tonight. - Also concomitantly start oral bicarbonate 1300 mg by mouth twice a day which should be continued at discharge - He should followup with renal  3.  Secondary hyperparathyroidism: Metabolic bone disease from CK D, Calcium 5.9 (normal albumin in January), Phos of 5.8. - Will give 1gm Calcium gluconate now - Per renal start Calcitriol 0.68mcg DAily and add Calcium carbonate /tums to each meal  4. Hypothyroidism continue Synthroid  5. borderline diabetes check hemoglobin A1c  DVT proph: lovenox  Code Status:  Undecided, upset and not willing to answer the question, hence presumed full Code for now Family Communication: d/w pt at bedside Disposition Plan: home when improved  Time spent: 75min  Jabron Weese [facility] Pager [phone] If 7PM-7AM, please contact night-coverage [email or web address] Password [facility] 01/02/2013, 5:14 PM

## 2013-01-02 NOTE — ED Notes (Signed)
Patient transported to X-ray 

## 2013-01-02 NOTE — ED Notes (Signed)
Pt's primary contact needs to be changed to Joas Sciullo (Pt's son) [phone] cell.

## 2013-01-02 NOTE — ED Notes (Signed)
MD at bedside. 

## 2013-01-02 NOTE — ED Notes (Signed)
Pt sent here from PCP d/t rt foot infection, states needs IV antibiotics. Pt states pulled his toe nail off about a week ago

## 2013-01-02 NOTE — Progress Notes (Addendum)
ANTIBIOTIC CONSULT NOTE - INITIAL  Pharmacy Consult for Vancomycin & Zosyn Indication: R Toe Cellulitis  Allergies  Allergen Reactions  . Aspirin    Patient Measurements: Height: 5\' 11"  (180.3 cm) IBW/kg (Calculated) : 75.3  Vital Signs: Temp: 98.6 F (37 C) (02/12 2034) Temp src: Oral (02/12 2034) BP: 150/53 mmHg (02/12 2034) Pulse Rate: 74 (02/12 2034) Intake/Output from previous day:   Intake/Output from this shift:    Labs:  Recent Labs  01/02/13 1351 01/02/13 1435  WBC 14.8*  --   HGB 10.3*  --   PLT 203  --   CREATININE  --  2.15*   The CrCl is unknown because both a height and weight (above a minimum accepted value) are required for this calculation. No results found for this basename: VANCOTROUGH, VANCOPEAK, VANCORANDOM, GENTTROUGH, GENTPEAK, GENTRANDOM, TOBRATROUGH, TOBRAPEAK, TOBRARND, AMIKACINPEAK, AMIKACINTROU, AMIKACIN,  in the last 72 hours   Microbiology: No results found for this or any previous visit (from the past 720 hour(s)).  Medical History: Past Medical History  Diagnosis Date  . Hypothyroid   . BPH (benign prostatic hyperplasia)   . Renal insufficiency   . Glucose intolerance (impaired glucose tolerance)   . Complication of anesthesia     "a tough time"  -pt unable to explain    Medications:  Anti-infectives   Start     Dose/Rate Route Frequency Ordered Stop   01/02/13 1315  vancomycin (VANCOCIN) IVPB 1000 mg/200 mL premix     1,000 mg 200 mL/hr over 60 Minutes Intravenous  Once 01/02/13 1310 01/02/13 1515   01/02/13 1315  piperacillin-tazobactam (ZOSYN) IVPB 3.375 g     3.375 g 12.5 mL/hr over 240 Minutes Intravenous  Once 01/02/13 1310 01/02/13 1623     Assessment: 82yoM with R great toe infection. Hx of DM, CKD stage 3. Clearance < 30 ml/min. Received Vancomycin 1gm in ED and Zosyn 3.375gm x1.  Goal of Therapy:  Vancomycin trough level 10-15 mcg/ml  Plan:  Vancomycin 500mg  q24 to begin tomorrow Zosyn 3.375gm  q8h Monitor renal function closely, adjust dose as needed. Trough at steady state if needed.  Minda Ditto PharmD Pager [phone] 01/02/2013,8:39 PM

## 2013-01-02 NOTE — Consult Note (Signed)
Reason for Consult:  Right 2nd toe infection Referring Physician:  Dr. Creed Copper. is an 79 y.o. male.  HPI: 79 y/o male with PMH of diabetes struck his right foot on a board several days ago.  He has macular degeneration and was unable to see any injury.  He placed a bandage on the toe.  Over the last day or so he has had increasing pain and noted bleeding from the toe.  He presented the ER for furterh eval.  He denies f/c/n/v.  He is not a current smoker.  He does not have a PCP.  He notes some pain in the toe but says it is mild.  He says it hurts with pressure and feels better when elevated.  Past Medical History  Diagnosis Date  . Hypothyroid   . BPH (benign prostatic hyperplasia)   . Renal insufficiency   . Glucose intolerance (impaired glucose tolerance)   . Complication of anesthesia     "a tough time"  -pt unable to explain    Past Surgical History  Procedure Laterality Date  . Abdominal aortic aneurysm repair    . Prostate surgery      History reviewed. No pertinent family history.  Social History:  reports that he quit smoking about 3 months ago. He has never used smokeless tobacco. He reports that he does not drink alcohol or use illicit drugs.  Allergies:  Allergies  Allergen Reactions  . Aspirin     Medications: I have reviewed the patient's current medications.  Results for orders placed during the hospital encounter of 01/02/13 (from the past 48 hour(s))  CBC WITH DIFFERENTIAL     Status: Abnormal   Collection Time    01/02/13  1:51 PM      Result Value Range   WBC 14.8 (*) 4.0 - 10.5 K/uL   RBC 3.32 (*) 4.22 - 5.81 MIL/uL   Hemoglobin 10.3 (*) 13.0 - 17.0 g/dL   HCT 30.4 (*) 39.0 - 52.0 %   MCV 91.6  78.0 - 100.0 fL   MCH 31.0  26.0 - 34.0 pg   MCHC 33.9  30.0 - 36.0 g/dL   RDW 14.4  11.5 - 15.5 %   Platelets 203  150 - 400 K/uL   Neutrophils Relative 90 (*) 43 - 77 %   Neutro Abs 13.4 (*) 1.7 - 7.7 K/uL   Lymphocytes Relative 4 (*) 12  - 46 %   Lymphs Abs 0.6 (*) 0.7 - 4.0 K/uL   Monocytes Relative 5  3 - 12 %   Monocytes Absolute 0.8  0.1 - 1.0 K/uL   Eosinophils Relative 0  0 - 5 %   Eosinophils Absolute 0.0  0.0 - 0.7 K/uL   Basophils Relative 0  0 - 1 %   Basophils Absolute 0.0  0.0 - 0.1 K/uL  BASIC METABOLIC PANEL     Status: Abnormal   Collection Time    01/02/13  2:35 PM      Result Value Range   Sodium 131 (*) 135 - 145 mEq/L   Potassium 3.9  3.5 - 5.1 mEq/L   Chloride 100  96 - 112 mEq/L   CO2 14 (*) 19 - 32 mEq/L   Glucose, Bld 152 (*) 70 - 99 mg/dL   BUN 47 (*) 6 - 23 mg/dL   Creatinine, Ser 2.15 (*) 0.50 - 1.35 mg/dL   Calcium 5.9 (*) 8.4 - 10.5 mg/dL   Comment: CRITICAL RESULT  CALLED TO, READ BACK BY AND VERIFIED WITH:     [id number]   GFR calc non Af Amer 27 (*) >90 mL/min   GFR calc Af Amer 32 (*) >90 mL/min   Comment:            The eGFR has been calculated     using the CKD EPI equation.     This calculation has not been     validated in all clinical     situations.     eGFR's persistently     <90 mL/min signify     possible Chronic Kidney Disease.    Dg Toe 2nd Right  01/02/2013  *RADIOLOGY REPORT*  Clinical Data: Nonhealing wound  RIGTH SECOND TOE  Comparison: None.  Findings: Three views right second toe submitted. There is bone destruction with erosive appearance at the tip of distal phalanx. There is associated soft tissue swelling with small amount of soft tissue air.  Findings are highly suspicious for osteomyelitis. Clinical correlation is necessary.  IMPRESSION: Findings suspicious for osteomyelitis at the tip of distal phalanx right second toe.   Original Report Authenticated By: Lahoma Crocker, M.D.     ROS:  As above. PE:  Blood pressure 140/48, pulse 71, temperature 99.6 F (37.6 C), temperature source Oral, resp. rate 20, height 5\' 11"  (1.803 m), SpO2 99.00%. eldery cachtectic appearing male in nad.  A and O x 4.  Mood and affect normal.  EOMI.  Resp  unlabored.  R foot with erythema from the 2nd toe spreading onto the dorsum of the foot to the level of the midfoot.  1+ dp and pt pulses.  Sens to LT diminished at the forefoot bilat.  He 5/5 strength at the toes and ankle in PF and DF.  Lesser toe hammertoe deformities at 2-4.  2nd toe is swollen with an ulcer distally.  The nail is loose and purulent drainage can be expressed with gentle palpation.  A qtip probes to the level of the bone of the distal phalanx deep to the nail.  No lymphadenopathy at the R LE.    Assessment/Plan: Right 2nd toe osteomyelitis and diabetic ulcer - I explained the nature of the problem to the pt in detail.  I believe that treating the osteomyelitis requires amputation of the right 2nd toe.  The risks and benefits of the alternative treatment options have been discussed in detail.  The patient wishes to proceed with surgery and specifically understands risks of bleeding, infection, nerve damage, blood clots, need for additional surgery, revision amputation and death.  He'll be NPO after midnight and can hopefully go to the OR tomorrow morning.   Wylene Simmer 01/02/2013, 5:16 PM

## 2013-01-03 ENCOUNTER — Encounter (HOSPITAL_COMMUNITY): Payer: Self-pay | Admitting: Anesthesiology

## 2013-01-03 ENCOUNTER — Encounter (HOSPITAL_COMMUNITY)
Admission: EM | Disposition: A | Discharge status: Home Health | Payer: Self-pay | Source: Home / Self Care | Attending: Internal Medicine

## 2013-01-03 ENCOUNTER — Inpatient Hospital Stay (HOSPITAL_COMMUNITY): Payer: Medicare Other | Admitting: Anesthesiology

## 2013-01-03 DIAGNOSIS — N400 Enlarged prostate without lower urinary tract symptoms: Secondary | ICD-10-CM

## 2013-01-03 DIAGNOSIS — L0291 Cutaneous abscess, unspecified: Secondary | ICD-10-CM

## 2013-01-03 DIAGNOSIS — L0390 Cellulitis, unspecified: Secondary | ICD-10-CM

## 2013-01-03 HISTORY — PX: AMPUTATION: SHX166

## 2013-01-03 LAB — COMPREHENSIVE METABOLIC PANEL
CO2: 13 mEq/L — ABNORMAL LOW (ref 19–32)
Calcium: 5.9 mg/dL — CL (ref 8.4–10.5)
Creatinine, Ser: 2.26 mg/dL — ABNORMAL HIGH (ref 0.50–1.35)
GFR calc Af Amer: 30 mL/min — ABNORMAL LOW (ref >90)
GFR calc non Af Amer: 26 mL/min — ABNORMAL LOW (ref >90)
Glucose, Bld: 99 mg/dL (ref 70–99)
Sodium: 132 mEq/L — ABNORMAL LOW (ref 135–145)
Total Protein: 6.2 g/dL (ref 6.0–8.3)

## 2013-01-03 LAB — CBC
Hemoglobin: 8.7 g/dL — ABNORMAL LOW (ref 13.0–17.0)
MCH: 30.7 pg (ref 26.0–34.0)
MCHC: 33.5 g/dL (ref 30.0–36.0)
MCV: 91.9 fL (ref 78.0–100.0)
RBC: 2.83 MIL/uL — ABNORMAL LOW (ref 4.22–5.81)

## 2013-01-03 LAB — GLUCOSE, CAPILLARY: Glucose-Capillary: 122 mg/dL — ABNORMAL HIGH (ref 70–99)

## 2013-01-03 LAB — SURGICAL PCR SCREEN
MRSA, PCR: NEGATIVE
Staphylococcus aureus: POSITIVE — AB

## 2013-01-03 LAB — HEMOGLOBIN A1C: Hgb A1c MFr Bld: 5.9 % — ABNORMAL HIGH (ref <5.7)

## 2013-01-03 SURGERY — AMPUTATION DIGIT
Anesthesia: Monitor Anesthesia Care | Site: Toe | Laterality: Right | Wound class: Dirty or Infected

## 2013-01-03 MED ORDER — OXYCODONE HCL 5 MG/5ML PO SOLN
5.0000 mg | Freq: Once | ORAL | Status: DC | PRN
  Filled 2013-01-03: qty 5

## 2013-01-03 MED ORDER — MEPERIDINE HCL 50 MG/ML IJ SOLN: 6.2500 mg | INTRAMUSCULAR | Status: DC | PRN

## 2013-01-03 MED ORDER — BACITRACIN ZINC 500 UNIT/GM EX OINT
TOPICAL_OINTMENT | CUTANEOUS | Status: AC
  Filled 2013-01-03: qty 15

## 2013-01-03 MED ORDER — BUPIVACAINE HCL (PF) 0.5 % IJ SOLN
INTRAMUSCULAR | Status: DC | PRN
  Administered 2013-01-03: 10 mL

## 2013-01-03 MED ORDER — ACETAMINOPHEN 10 MG/ML IV SOLN: 1000.0000 mg | Freq: Once | INTRAVENOUS | Status: DC | PRN

## 2013-01-03 MED ORDER — PHENYLEPHRINE HCL 10 MG/ML IJ SOLN
INTRAMUSCULAR | Status: DC | PRN
  Administered 2013-01-03 (×6): 40 ug via INTRAVENOUS

## 2013-01-03 MED ORDER — PROPOFOL 10 MG/ML IV EMUL
INTRAVENOUS | Status: DC | PRN
  Administered 2013-01-03: 20 mg via INTRAVENOUS
  Administered 2013-01-03: 120 mg via INTRAVENOUS

## 2013-01-03 MED ORDER — OXYCODONE HCL 5 MG PO TABS: 5.0000 mg | ORAL_TABLET | Freq: Once | ORAL | Status: DC | PRN

## 2013-01-03 MED ORDER — BACITRACIN ZINC 500 UNIT/GM EX OINT
TOPICAL_OINTMENT | CUTANEOUS | Status: DC | PRN
  Administered 2013-01-03: 1 via TOPICAL

## 2013-01-03 MED ORDER — HYDROMORPHONE HCL PF 1 MG/ML IJ SOLN: 0.2500 mg | INTRAMUSCULAR | Status: DC | PRN

## 2013-01-03 MED ORDER — CHLORHEXIDINE GLUCONATE CLOTH 2 % EX PADS
6.0000 | MEDICATED_PAD | Freq: Every day | CUTANEOUS | Status: DC
  Administered 2013-01-03: 6 via TOPICAL

## 2013-01-03 MED ORDER — BUPIVACAINE HCL (PF) 0.5 % IJ SOLN
INTRAMUSCULAR | Status: AC
  Filled 2013-01-03: qty 30

## 2013-01-03 MED ORDER — LIDOCAINE HCL 1 % IJ SOLN
INTRAMUSCULAR | Status: DC | PRN
  Administered 2013-01-03: 50 mg via INTRADERMAL

## 2013-01-03 MED ORDER — SODIUM BICARBONATE 8.4 % IV SOLN
INTRAVENOUS | Status: DC
  Administered 2013-01-03 – 2013-01-05 (×3): via INTRAVENOUS
  Filled 2013-01-03 (×8): qty 1000

## 2013-01-03 MED ORDER — SODIUM CHLORIDE 0.9 % IV SOLN
INTRAVENOUS | Status: DC | PRN
  Administered 2013-01-03: 09:00:00 via INTRAVENOUS

## 2013-01-03 MED ORDER — SODIUM CHLORIDE 0.9 % IV SOLN
1.0000 g | Freq: Once | INTRAVENOUS | Status: AC
  Administered 2013-01-03: 1 g via INTRAVENOUS
  Filled 2013-01-03: qty 10

## 2013-01-03 MED ORDER — ONDANSETRON HCL 4 MG/2ML IJ SOLN
INTRAMUSCULAR | Status: DC | PRN
  Administered 2013-01-03: 4 mg via INTRAVENOUS

## 2013-01-03 MED ORDER — PROMETHAZINE HCL 25 MG/ML IJ SOLN: 6.2500 mg | INTRAMUSCULAR | Status: DC | PRN

## 2013-01-03 MED ORDER — FENTANYL CITRATE 0.05 MG/ML IJ SOLN
INTRAMUSCULAR | Status: DC | PRN
  Administered 2013-01-03: 50 ug via INTRAVENOUS

## 2013-01-03 SURGICAL SUPPLY — 26 items
BANDAGE ELASTIC 4 VELCRO ST LF (GAUZE/BANDAGES/DRESSINGS) ×2 IMPLANT
BNDG COHESIVE 6X5 TAN STRL LF (GAUZE/BANDAGES/DRESSINGS) ×2 IMPLANT
BNDG ESMARK 4X9 LF (GAUZE/BANDAGES/DRESSINGS) ×2 IMPLANT
CHLORAPREP W/TINT 26ML (MISCELLANEOUS) ×2 IMPLANT
CLOTH BEACON ORANGE TIMEOUT ST (SAFETY) ×2 IMPLANT
CUFF TOURN SGL QUICK 34 (TOURNIQUET CUFF)
CUFF TRNQT CYL 34X4X40X1 (TOURNIQUET CUFF) IMPLANT
DRAPE U-SHAPE 47X51 STRL (DRAPES) ×2 IMPLANT
DRSG ADAPTIC 3X8 NADH LF (GAUZE/BANDAGES/DRESSINGS) ×2 IMPLANT
DRSG PAD ABDOMINAL 8X10 ST (GAUZE/BANDAGES/DRESSINGS) ×2 IMPLANT
ELECT REM PT RETURN 9FT ADLT (ELECTROSURGICAL) ×2
ELECTRODE REM PT RTRN 9FT ADLT (ELECTROSURGICAL) ×1 IMPLANT
GLOVE BIO SURGEON STRL SZ8 (GLOVE) ×4 IMPLANT
GLOVE BIOGEL PI IND STRL 8 (GLOVE) ×1 IMPLANT
GLOVE BIOGEL PI INDICATOR 8 (GLOVE) ×1
GOWN PREVENTION PLUS XLARGE (GOWN DISPOSABLE) ×2 IMPLANT
GOWN STRL NON-REIN LRG LVL3 (GOWN DISPOSABLE) ×2 IMPLANT
KIT BASIN OR (CUSTOM PROCEDURE TRAY) ×2 IMPLANT
MANIFOLD NEPTUNE II (INSTRUMENTS) ×2 IMPLANT
PACK LOWER EXTREMITY WL (CUSTOM PROCEDURE TRAY) ×2 IMPLANT
PAD CAST 4YDX4 CTTN HI CHSV (CAST SUPPLIES) ×2 IMPLANT
PADDING CAST COTTON 4X4 STRL (CAST SUPPLIES) ×2
SPONGE GAUZE 4X4 12PLY (GAUZE/BANDAGES/DRESSINGS) ×2 IMPLANT
SUCTION FRAZIER TIP 10 FR DISP (SUCTIONS) ×2 IMPLANT
SUT ETHILON 3 0 PS 1 (SUTURE) ×2 IMPLANT
TOWEL OR 17X26 10 PK STRL BLUE (TOWEL DISPOSABLE) ×2 IMPLANT

## 2013-01-03 NOTE — Progress Notes (Signed)
Pt came up from PACU. Pt is in NAD. Will continue to monitor.

## 2013-01-03 NOTE — Transfer of Care (Signed)
Immediate Anesthesia Transfer of Care Note  Patient: [name].  Procedure(s) Performed: Procedure(s) with comments: AMPUTATION DIGIT (Right) - RIGHT 2ND TOE AMPUTATION  Patient Location: PACU  Anesthesia Type:General  Level of Consciousness: awake, alert , oriented and patient cooperative  Airway & Oxygen Therapy: Patient Spontanous Breathing and Patient connected to face mask oxygen  Post-op Assessment: Report given to PACU RN, Post -op Vital signs reviewed and stable and Patient moving all extremities X 4  Post vital signs: Reviewed and stable  Complications: No apparent anesthesia complications

## 2013-01-03 NOTE — Progress Notes (Signed)
CRITICAL VALUE ALERT  Critical value received:  Ca 5.9  Date of notification:  01/03/2013  Time of notification:  [id number]  Critical value read back: yes  Nurse who received alert:  Krista Blue, RN  MD notified (1st page):  Lamar Blinks, NP  Time of first page:  [phone]  MD notified (2nd page):  Time of second page:  Responding MD:  Lamar Blinks, NP  Time MD responded:  [phone]

## 2013-01-03 NOTE — Op Note (Signed)
[name], [name] NO.:  [id number]  MEDICAL RECORD NO.:  [id number]  LOCATION:  [id number]                         FACILITY:  [facility]  PHYSICIAN:  Wylene Simmer, MD        DATE OF BIRTH:  1931-01-21  DATE OF PROCEDURE:  01/02/2013 DATE OF DISCHARGE:                              OPERATIVE REPORT   PREOPERATIVE DIAGNOSIS:  Right second toe osteomyelitis.  POSTOPERATIVE DIAGNOSES: 1. Right second toe osteomyelitis. 2. Right plantar foot abscess.  PROCEDURE: 1. Right second toe amputation through the MTP joint. 2. Irrigation and debridement of right foot plantar abscess including     multiple bursal areas.  SURGEON:  Wylene Simmer, MD  ANESTHESIA:  General.  ESTIMATED BLOOD LOSS:  Minimal.  TOURNIQUET TIME:  Approximately 20 minutes with an ankle Esmarch.  SPECIMENS:  Deep tissue to microbiology and the second toe to Pathology.  COMPLICATIONS:  None apparent.  DISPOSITION:  Extubated awake and stable to recovery.  INDICATIONS FOR PROCEDURE:  The patient is an 79 year old male with past medical history significant for diabetes and smoking.  He presented to the emergency room with cellulitis of his right foot and an ulcer at the tip of the right second toe that probed to the level of the bone.  X- rays confirmed osteolysis at the distal phalanx of the right second toe. He presents now for operative treatment of his right second toe osteomyelitis.  He understands the risks and benefits, the alternative treatment options including specifically risks of bleeding, infection, nerve damage, blood clots, need for additional surgery, revision amputation, and death.  He elects to proceed with right second toe amputation.  PROCEDURE IN DETAIL:  After preoperative consent was obtained, the correct operative site was identified.  The patient was brought to the operating room and placed supine on the operating table.  General anesthesia was induced.  The patient was  already on therapeutic vancomycin and Zosyn.  Surgical time-out was taken.  The right lower extremity was then prepped and draped in standard sterile fashion.  The foot was exsanguinated and a 4-inch Esmarch tourniquet was wrapped around the ankle.  Initially a fishmouth incision was marked on the skin at the level of the PIP joint.  The incision was made.  Sharp dissection was carried down through the skin and subcutaneous tissue.  Immediately evident on the plantar surface of the toe was pus tracking proximally. Given the extensive soft tissue involvement around the base of the toe, the decision was made to proceed with amputation through the MP joint. The incision was then converted to a racket style incision around the base of the toe.  The toe was disarticulated through the MP joint and passed off to the back table.  The plantar soft tissues were noted to have an abscess with significant purulent and necrotic material.  This was all debrided sharply with a scalpel and rongeur.  The wound was irrigated copiously.  Again, the area of necrotic material was debrided of all purulent and necrotic appearing tissue.  This included multiple bursal areas in the inner metatarsal spaces in the areas plantar to the metatarsal.  The wound was again irrigated  copiously.  At this point, the remaining tissue all appeared healthy and viable.  The neurovascular bundles were cauterized.  The tourniquet was released and hemostasis was achieved.  The skin edges were then approximated with horizontal mattress sutures of 3-0 nylon.  Sterile dressings were applied followed by compression wrap.  On the back table, specimen of deep tissue was obtained from the distal end of the toe and sent to Microbiology for aerobic and anaerobic culture.  The remaining portion of the toe was sent to Pathology.  The patient was then awakened from anesthesia and transported to recovery room in stable condition.  FOLLOWUP  PLAN:  The patient will be readmitted to the Internal Medicine service.  He will be weightbearing as tolerated in a hard-sole shoe.  He will continue on IV antibiotics and probably will need antibiotics for IV antibiotics for at least 2 weeks.     Wylene Simmer, MD     JH/MEDQ  D:  01/03/2013  T:  01/03/2013  Job:  [id number]

## 2013-01-03 NOTE — Progress Notes (Signed)
[facility] PROGRESS NOTE  [name]. BZ:[id number] DOB: [birth year] DOA: 01/02/2013 PCP: Wyatt Haste, MD  Assessment/Plan: 1. Cellulitis of right foot , with osteomyelitis of second toe  - s/p toe amputation and drainage of plantar abscess 2/13 - Continue IV vancomycin/Zosyn, FU cultures - Wound care   2. 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  - Discussed with Dr. Mercy Moore renal yesterday, IVF with Na bicarb, unfortunately didn't start this last pm - Also concomitantly start oral bicarbonate 1300 mg by mouth twice a day which should be continued at discharge  - He should followup with renal   3. Secondary hyperparathyroidism: Metabolic bone disease from CK D, Calcium 5.9 (normal albumin in January), Phos of 5.8.  - received  1gm Calcium gluconate 2/12  - Per renal start Calcitriol 0.26mcg DAily and add Calcium carbonate /tums to each meal   4. Hypothyroidism continue Synthroid   5. borderline diabetes: FU hemoglobin A1c   DVT proph: lovenox   Code Status: FULL Family Communication: d/w pt at bedside Disposition Plan: home soon   Consultants:  Dr.Hewitt, Orthopedics  Procedures:  Toe amputation and drainage of abscess  Antibiotics:  Vanc/Zosyn 2/12  HPI/Subjective: Feels ok, back from PACU  Objective: Filed Vitals:   01/03/13 1115 01/03/13 1130 01/03/13 1146 01/03/13 1457  BP: 96/56 106/47 112/54 100/50  Pulse: 51 52 71 60  Temp:  98.2 F (36.8 C) 97.7 F (36.5 C) 97.8 F (36.6 C)  TempSrc:    Oral  Resp: 15 14 20 20   Height:      Weight:      SpO2: 99% 99% 100% 100%    Intake/Output Summary (Last 24 hours) at 01/03/13 2054 Last data filed at 01/03/13 2028  Gross per 24 hour  Intake   1460 ml  Output    350 ml  Net   1110 ml   Filed Weights   01/02/13 2034  Weight: 74.844 kg (165 lb)     Exam:   General:  AAOX3  Cardiovascular: S1S2/RRR  Respiratory: CTAB  Abdomen: soft, nt, bs present  EXt: dressing on R foot post amputation  Data Reviewed: Basic Metabolic Panel:  Recent Labs Lab 01/02/13 1435 01/03/13 0355  NA 131* 132*  K 3.9 3.6  CL 100 103  CO2 14* 13*  GLUCOSE 152* 99  BUN 47* 50*  CREATININE 2.15* 2.26*  CALCIUM 5.9* 5.9*   Liver Function Tests:  Recent Labs Lab 01/02/13 1435 01/03/13 0355  AST 14 14  ALT 13 12  ALKPHOS 72 72  BILITOT 0.4 0.4  PROT 6.5 6.2  ALBUMIN 2.9* 2.6*   No results found for this basename: LIPASE, AMYLASE,  in the last 168 hours No results found for this basename: AMMONIA,  in the last 168 hours CBC:  Recent Labs Lab 01/02/13 1351 01/03/13 0355  WBC 14.8* 11.3*  NEUTROABS 13.4*  --   HGB 10.3* 8.7*  HCT 30.4* 26.0*  MCV 91.6 91.9  PLT 203 170   Cardiac Enzymes: No results found for this basename: CKTOTAL, CKMB, CKMBINDEX, TROPONINI,  in the last 168 hours BNP (last 3 results) No results found for this basename: PROBNP,  in the last 8760 hours CBG:  Recent Labs Lab 01/03/13 0911 01/03/13 1044  GLUCAP 122* 110*    Recent Results (from the past 240 hour(s))  SURGICAL  PCR SCREEN     Status: Abnormal   Collection Time    01/03/13  7:28 AM      Result Value Range Status   MRSA, PCR NEGATIVE  NEGATIVE Final   Staphylococcus aureus POSITIVE (*) NEGATIVE Final   Comment:            The Xpert SA Assay (FDA     approved for NASAL specimens     in patients over 38 years of age),     is one component of     a comprehensive surveillance     program.  Test performance has     been validated by [employer] for patients greater     than or equal to 87 year old.     It is not intended     to diagnose infection nor to     guide or monitor treatment.     Studies: Dg Toe 2nd Right  01/02/2013  *RADIOLOGY REPORT*  Clinical Data: Nonhealing wound  RIGTH SECOND TOE  Comparison: None.   Findings: Three views right second toe submitted. There is bone destruction with erosive appearance at the tip of distal phalanx. There is associated soft tissue swelling with small amount of soft tissue air.  Findings are highly suspicious for osteomyelitis. Clinical correlation is necessary.  IMPRESSION: Findings suspicious for osteomyelitis at the tip of distal phalanx right second toe.   Original Report Authenticated By: Lahoma Crocker, M.D.     Scheduled Meds: . calcitRIOL  0.25 mcg Oral Daily  . calcium carbonate  1 tablet Oral TID WC  . Chlorhexidine Gluconate Cloth  6 each Topical Q0600  . enoxaparin (LOVENOX) injection  30 mg Subcutaneous Q24H  . levothyroxine  88 mcg Oral QAC breakfast  . multivitamin with minerals  1 tablet Oral Daily  . piperacillin-tazobactam (ZOSYN)  IV  3.375 g Intravenous Q8H  . sodium bicarbonate  1,300 mg Oral BID  . trimethoprim-polymyxin b  1 drop Left Eye QID  . vancomycin  500 mg Intravenous Q24H  . [START ON 01/07/2013] Vitamin D (Ergocalciferol)  50,000 Units Oral Q Mon   Continuous Infusions: . dextrose 5 % 1,000 mL with sodium bicarbonate 100 mEq infusion 75 mL/hr at 01/03/13 1317    Active Problems:   Hypothyroid   BPH (benign prostatic hyperplasia)   Borderline diabetes mellitus   Cellulitis of foot, right   CKD (chronic kidney disease), stage III   Metabolic acidosis, increased anion gap    Time spent: 67min   [facility] Pager [phone]. If 8PM-8AM, please contact night-coverage at [email or web address], password [facility] 01/03/2013, 8:54 PM  LOS: 1 day

## 2013-01-03 NOTE — Progress Notes (Signed)
EKG showed Atrial Fib with PVCs with a rate of 90. Question whether this may be due to hypocalcemia this morning. Pt was c/o generalized cramping also this morning. On-call provider notified to see whether patient may need telemetry monitoring. Informed midlevel that the pt is currently on a floor that does not have cardiac monitoring. This RN never received a callback and no new orders were placed.

## 2013-01-03 NOTE — Brief Op Note (Signed)
01/02/2013 - 01/03/2013  10:35 AM  PATIENT:  [name].  79 y.o. male  PRE-OPERATIVE DIAGNOSIS:  right 2nd toe osteomylitis  POST-OPERATIVE DIAGNOSIS:  Right 2nd toe osteomyelitis and plantar foot abscess  Procedure(s): 1.  Right 2nd toe amputation through the MTP joint 2.  Irrigation and debridement of right plantar foot abscess (multiple areas)  SURGEON:  Wylene Simmer, MD  ASSISTANT: n/a  ANESTHESIA:   General  EBL:  minimal   TOURNIQUET:  approx 20 min with ankle esmarch  COMPLICATIONS:  None apparent  DISPOSITION:  Extubated, awake and stable to recovery.  Specimens:  Deep tissue to micro and toe to path.  DICTATION ID:  [id number]

## 2013-01-03 NOTE — Progress Notes (Signed)
Ortho Tech in- patient fitted with right foot boot.

## 2013-01-03 NOTE — Progress Notes (Signed)
RN called hospitalist about pt's EKG results as well as low Ca levels. Hospitalist feels that pt would benefit being on telemetry after procedure in OR today.

## 2013-01-03 NOTE — Anesthesia Postprocedure Evaluation (Signed)
Anesthesia Post Note  Patient: [name].  Procedure(s) Performed: Procedure(s) (LRB): AMPUTATION DIGIT (Right)  Anesthesia type: General  Patient location: PACU  Post pain: Pain level controlled  Post assessment: Post-op Vital signs reviewed  Last Vitals: BP 105/60  Pulse 51  Temp(Src) 37.1 C (Oral)  Resp 15  Ht 5\' 11"  (1.803 m)  Wt 165 lb (74.844 kg)  BMI 23.02 kg/m2  SpO2 99%  Post vital signs: Reviewed  Level of consciousness: sedated  Complications: No apparent anesthesia complications

## 2013-01-03 NOTE — Preoperative (Signed)
Beta Blockers   Reason not to administer Beta Blockers:Not Applicable 

## 2013-01-03 NOTE — Anesthesia Preprocedure Evaluation (Addendum)
Anesthesia Evaluation  Patient identified by MRN, date of birth, ID band Patient awake    Reviewed: Allergy & Precautions, H&P , NPO status , Patient's Chart, lab work & pertinent test results  History of Anesthesia Complications Negative for: history of anesthetic complications  Airway Mallampati: II TM Distance: >3 FB Neck ROM: Full    Dental  (+) Dental Advisory Given and Teeth Intact   Pulmonary neg pulmonary ROS,  breath sounds clear to auscultation  Pulmonary exam normal       Cardiovascular negative cardio ROS  + dysrhythmias Atrial Fibrillation Rhythm:Regular Rate:Normal     Neuro/Psych negative neurological ROS  negative psych ROS   GI/Hepatic negative GI ROS, Neg liver ROS,   Endo/Other  Hypothyroidism   Renal/GU CRF and Renal InsufficiencyRenal disease     Musculoskeletal negative musculoskeletal ROS (+)   Abdominal   Peds  Hematology negative hematology ROS (+)   Anesthesia Other Findings   Reproductive/Obstetrics                         Anesthesia Physical Anesthesia Plan  ASA: III  Anesthesia Plan: General   Post-op Pain Management:    Induction: Intravenous  Airway Management Planned: LMA  Additional Equipment:   Intra-op Plan:   Post-operative Plan: Extubation in OR  Informed Consent: I have reviewed the patients History and Physical, chart, labs and discussed the procedure including the risks, benefits and alternatives for the proposed anesthesia with the patient or authorized representative who has indicated his/her understanding and acceptance.   Dental advisory given  Plan Discussed with: CRNA  Anesthesia Plan Comments:        Anesthesia Quick Evaluation

## 2013-01-04 ENCOUNTER — Encounter (HOSPITAL_COMMUNITY): Payer: Self-pay | Admitting: Orthopedic Surgery

## 2013-01-04 DIAGNOSIS — E871 Hypo-osmolality and hyponatremia: Secondary | ICD-10-CM

## 2013-01-04 LAB — CBC
HCT: 24.9 % — ABNORMAL LOW (ref 39.0–52.0)
MCH: 30.1 pg (ref 26.0–34.0)
MCHC: 32.9 g/dL (ref 30.0–36.0)
MCV: 91.5 fL (ref 78.0–100.0)
Platelets: 151 10*3/uL (ref 150–400)
RDW: 14.6 % (ref 11.5–15.5)
WBC: 7.9 10*3/uL (ref 4.0–10.5)

## 2013-01-04 LAB — BASIC METABOLIC PANEL
BUN: 48 mg/dL — ABNORMAL HIGH (ref 6–23)
Calcium: 5.7 mg/dL — CL (ref 8.4–10.5)
Creatinine, Ser: 2.41 mg/dL — ABNORMAL HIGH (ref 0.50–1.35)
GFR calc Af Amer: 28 mL/min — ABNORMAL LOW (ref >90)
GFR calc non Af Amer: 24 mL/min — ABNORMAL LOW (ref >90)

## 2013-01-04 LAB — VANCOMYCIN, TROUGH: Vancomycin Tr: 7.7 ug/mL — ABNORMAL LOW (ref 10.0–20.0)

## 2013-01-04 NOTE — Progress Notes (Signed)
Pt declined any Home Health needs.

## 2013-01-04 NOTE — Progress Notes (Signed)
Subjective: 1 Day Post-Op Procedure(s) (LRB): AMPUTATION DIGIT (Right) Patient reports pain as none.  No n/v.    Objective: Vital signs in last 24 hours: Temp:  [97.8 F (36.6 C)-99 F (37.2 C)] 99 F (37.2 C) (02/14 0509) Pulse Rate:  [55-70] 56 (02/14 0608) Resp:  [16-20] 18 (02/14 0608) BP: (98-110)/(40-57) 108/57 mmHg (02/14 0608) SpO2:  [98 %-100 %] 100 % (02/14 0608)  Intake/Output from previous day: 02/13 0701 - 02/14 0700 In: 1080 [P.O.:480; I.V.:600] Out: 650 [Urine:650] Intake/Output this shift: Total I/O In: 360 [P.O.:360] Out: 1 [Stool:1]   Recent Labs  01/02/13 1351 01/03/13 0355 01/04/13 0350  HGB 10.3* 8.7* 8.2*    Recent Labs  01/03/13 0355 01/04/13 0350  WBC 11.3* 7.9  RBC 2.83* 2.72*  HCT 26.0* 24.9*  PLT 170 151    Recent Labs  01/03/13 0355 01/04/13 0350  NA 132* 133*  K 3.6 3.7  CL 103 104  CO2 13* 14*  BUN 50* 48*  CREATININE 2.26* 2.41*  GLUCOSE 99 204*  CALCIUM 5.9* 5.7*   Cxs:  GS shows few GPC.  No cx results to date.  right foot dressed and dry.  Assessment/Plan: 1 Day Post-Op Procedure(s) (LRB): AMPUTATION DIGIT (Right) Continue vanc and zosyn pending culture results.  WBAT in hard sole shoe.  Consider ID consult to assist with abx selection and timing once culture results are final. I'll continue to follow with you.  No dressing change needed for at least 2 more days./  Amelia Macken, [facility] 01/04/2013, 12:36 PM

## 2013-01-04 NOTE — Progress Notes (Signed)
Pharmacy: Vancomycin brief note  D#3 Vancomycin for cellulitis of R foot with OM of second toe s/p amputation and drainage of plantar abscess on 2/13.  Scr is rising, 2.41 today.  Random vancomycin trough ordered to ensure vancomycin not accumulating given worsening renal function.  Vancomycin trough 7.7 resulted below goal of 15-20 which is expected after only 2 doses.  Will continue current dose now and f/u with AM renal function.    BorgerdingGaye Alken PharmD Pager #: [phone] 2:31 PM 01/04/2013

## 2013-01-04 NOTE — Progress Notes (Signed)
[facility] PROGRESS NOTE  [name]. BZ:[id number] DOB: Sep 19, 1931 DOA: 01/02/2013 PCP: Wyatt Haste, MD  Assessment/Plan: 1. Cellulitis of right foot , with osteomyelitis of second toe  - s/p toe amputation and drainage of plantar abscess 2/13 - Continue IV vancomycin/Zosyn, FU cultures - Wound care  - I d/w Pt he is reluctant to consider PICC line and IV abx since he still works in [employer] - At this point, once cultures final will d/w ID and then re-discuss PICC with pt if needed  2. 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  - Discussed with Dr. Mercy Moore renal 2/12, IVF with Na bicarb continue this 1 more day - Also concomitantly continue oral bicarbonate 1300 mg by mouth twice a day which should be continued at discharge  - He should followup with renal   3. Secondary hyperparathyroidism: Metabolic bone disease from CK D, Calcium 5.9 (normal albumin in January), Phos of 5.8.  - received  1gm Calcium gluconate 2/12  - Per renal, continue Calcitriol 0.10mcg DAily and add Calcium carbonate /tums to each meal   4. Hypothyroidism continue Synthroid   5. borderline diabetes: hemoglobin A1c 5.9  DVT proph: lovenox   Code Status: FULL Family Communication: d/w pt at bedside Disposition Plan: home soon   Consultants:  Dr.Hewitt, Orthopedics  Procedures:  Toe amputation and drainage of abscess 2/13  Antibiotics:  Vanc/Zosyn 2/12 -->  HPI/Subjective: Doing well, no complaints  Objective: Filed Vitals:   01/03/13 2224 01/04/13 0509 01/04/13 0608 01/04/13 1302  BP: 110/55 98/40 108/57 106/48  Pulse: 70 55 56 58  Temp: 98.1 F (36.7 C) 99 F (37.2 C)  98.1 F (36.7 C)  TempSrc: Oral Oral  Oral  Resp: 20 16 18 15   Height:      Weight:      SpO2: 98% 99% 100% 100%    Intake/Output Summary (Last 24 hours)  at 01/04/13 1315 Last data filed at 01/04/13 1303  Gross per 24 hour  Intake   1200 ml  Output    901 ml  Net    299 ml   Filed Weights   01/02/13 2034  Weight: 74.844 kg (165 lb)    Exam:   General:  AAOX3  Cardiovascular: S1S2/RRR  Respiratory: CTAB  Abdomen: soft, nt, bs present  EXt: dressing on R foot post amputation  Data Reviewed: Basic Metabolic Panel:  Recent Labs Lab 01/02/13 1435 01/03/13 0355 01/04/13 0350  NA 131* 132* 133*  K 3.9 3.6 3.7  CL 100 103 104  CO2 14* 13* 14*  GLUCOSE 152* 99 204*  BUN 47* 50* 48*  CREATININE 2.15* 2.26* 2.41*  CALCIUM 5.9* 5.9* 5.7*   Liver Function Tests:  Recent Labs Lab 01/02/13 1435 01/03/13 0355  AST 14 14  ALT 13 12  ALKPHOS 72 72  BILITOT 0.4 0.4  PROT 6.5 6.2  ALBUMIN 2.9* 2.6*   No results found for this basename: LIPASE, AMYLASE,  in the last 168 hours No results found for this basename: AMMONIA,  in the last 168 hours CBC:  Recent Labs Lab 01/02/13 1351 01/03/13 0355 01/04/13 0350  WBC 14.8* 11.3* 7.9  NEUTROABS 13.4*  --   --   HGB 10.3* 8.7* 8.2*  HCT 30.4* 26.0* 24.9*  MCV 91.6 91.9 91.5  PLT 203 170 151   Cardiac  Enzymes: No results found for this basename: CKTOTAL, CKMB, CKMBINDEX, TROPONINI,  in the last 168 hours BNP (last 3 results) No results found for this basename: PROBNP,  in the last 8760 hours CBG:  Recent Labs Lab 01/03/13 0911 01/03/13 1044  GLUCAP 122* 110*    Recent Results (from the past 240 hour(s))  SURGICAL PCR SCREEN     Status: Abnormal   Collection Time    01/03/13  7:28 AM      Result Value Range Status   MRSA, PCR NEGATIVE  NEGATIVE Final   Staphylococcus aureus POSITIVE (*) NEGATIVE Final   Comment:            The Xpert SA Assay (FDA     approved for NASAL specimens     in patients over 17 years of age),     is one component of     a comprehensive surveillance     program.  Test performance has     been validated by [employer] for  patients greater     than or equal to 60 year old.     It is not intended     to diagnose infection nor to     guide or monitor treatment.  ANAEROBIC CULTURE     Status: None   Collection Time    01/03/13  9:56 AM      Result Value Range Status   Specimen Description TOE RIGHT   Final   Special Requests NONE   Final   Gram Stain     Final   Value: ABUNDANT WBC PRESENT, PREDOMINANTLY PMN     FEW GRAM POSITIVE COCCI     IN PAIRS   Culture PENDING   Incomplete   Report Status PENDING   Incomplete  TISSUE CULTURE     Status: None   Collection Time    01/03/13  9:56 AM      Result Value Range Status   Specimen Description TISSUE   Final   Special Requests NONE   Final   Gram Stain PENDING   Incomplete   Culture Culture reincubated for better growth   Final   Report Status PENDING   Incomplete     Studies: Dg Toe 2nd Right  01/02/2013  *RADIOLOGY REPORT*  Clinical Data: Nonhealing wound  RIGTH SECOND TOE  Comparison: None.  Findings: Three views right second toe submitted. There is bone destruction with erosive appearance at the tip of distal phalanx. There is associated soft tissue swelling with small amount of soft tissue air.  Findings are highly suspicious for osteomyelitis. Clinical correlation is necessary.  IMPRESSION: Findings suspicious for osteomyelitis at the tip of distal phalanx right second toe.   Original Report Authenticated By: Lahoma Crocker, M.D.     Scheduled Meds: . calcitRIOL  0.25 mcg Oral Daily  . calcium carbonate  1 tablet Oral TID WC  . enoxaparin (LOVENOX) injection  30 mg Subcutaneous Q24H  . levothyroxine  88 mcg Oral QAC breakfast  . multivitamin with minerals  1 tablet Oral Daily  . piperacillin-tazobactam (ZOSYN)  IV  3.375 g Intravenous Q8H  . sodium bicarbonate  1,300 mg Oral BID  . trimethoprim-polymyxin b  1 drop Left Eye QID  . vancomycin  500 mg Intravenous Q24H  . [START ON 01/07/2013] Vitamin D (Ergocalciferol)  50,000 Units Oral Q Mon    Continuous Infusions: . dextrose 5 % 1,000 mL with sodium bicarbonate 100 mEq infusion 75 mL/hr  at 01/03/13 1317    Active Problems:   Hypothyroid   BPH (benign prostatic hyperplasia)   Borderline diabetes mellitus   Cellulitis of foot, right   CKD (chronic kidney disease), stage III   Metabolic acidosis, increased anion gap    Time spent: 83min   [place] [facility] Pager [phone]. If 8PM-8AM, please contact night-coverage at [email or web address], password [email or web address] 01/04/2013, 1:15 PM  LOS: 2 days

## 2013-01-04 NOTE — ED Provider Notes (Signed)
Medical screening examination/treatment/procedure(s) were performed by non-physician practitioner and as supervising physician I was immediately available for consultation/collaboration.   Dot Lanes, MD 01/04/13 [phone]

## 2013-01-05 LAB — BASIC METABOLIC PANEL
BUN: 37 mg/dL — ABNORMAL HIGH (ref 6–23)
Calcium: 5.8 mg/dL — CL (ref 8.4–10.5)
Creatinine, Ser: 2.23 mg/dL — ABNORMAL HIGH (ref 0.50–1.35)
GFR calc Af Amer: 30 mL/min — ABNORMAL LOW (ref >90)

## 2013-01-05 NOTE — Progress Notes (Signed)
Subjective: 2 Days Post-Op Procedure(s) (LRB): AMPUTATION DIGIT (Right) Patient reports pain as none.  No c/o.  Objective: Vital signs in last 24 hours: Temp:  [97.6 F (36.4 C)-98.2 F (36.8 C)] 97.6 F (36.4 C) (02/15 0659) Pulse Rate:  [58-73] 61 (02/15 0659) Resp:  [15-18] 16 (02/15 0659) BP: (106-138)/(48-59) 121/48 mmHg (02/15 0659) SpO2:  [100 %] 100 % (02/15 0659)  Intake/Output from previous day: 02/14 0701 - 02/15 0700 In: 2812 [P.O.:1200; I.V.:1189; IV [id number] Out: 2051 [Urine:2050; Stool:1] Intake/Output this shift:     Recent Labs  01/02/13 1351 01/03/13 0355 01/04/13 0350  HGB 10.3* 8.7* 8.2*    Recent Labs  01/03/13 0355 01/04/13 0350  WBC 11.3* 7.9  RBC 2.83* 2.72*  HCT 26.0* 24.9*  PLT 170 151    Recent Labs  01/04/13 0350 01/05/13 0410  NA 133* 137  K 3.7 3.5  CL 104 106  CO2 14* 19  BUN 48* 37*  CREATININE 2.41* 2.23*  GLUCOSE 204* 132*  CALCIUM 5.7* 5.8*     wound dressed and dry.  Toes well perfused.  Assessment/Plan: 2 Days Post-Op Procedure(s) (LRB): AMPUTATION DIGIT (Right) WBAT in hard sole shoe.  Continue IV vanc and zosyn.  Await final cx results.  I'll plan to look at the wound on Monday and change the dressing.  Hopefully the cultures will be final and we can plan d/c home then.  Wylene Simmer 01/05/2013, 8:15 AM

## 2013-01-06 NOTE — Progress Notes (Signed)
[facility] PROGRESS NOTE  [name]. [id number] DOB: December 24, 1930 DOA: 01/02/2013 PCP: Wyatt Haste, MD  Assessment/Plan: 1. Cellulitis of right foot , with osteomyelitis of second toe  - s/p toe amputation and drainage of plantar abscess per Dr.Hweitt 2/13 - Continue IV vancomycin/Zosyn, FU final cultures - Wound care  - At this point, once cultures final will d/w ID and then re-discuss PICC with pt if needed, he was extremely reluctant to get a PICC when discussed initially  2. 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  - Discussed with Dr. Mercy Moore renal 2/12, stopped IVF with bicarb yesterday - Also concomitantly continue oral bicarbonate 1300 mg by mouth twice a day which should be continued at discharge  - He should followup with renal   3. Secondary hyperparathyroidism: Metabolic bone disease from CK D, Calcium 5.9 (normal albumin in January), Phos of 5.8.  - received  1gm Calcium gluconate 2/12  - continue Calcitriol 0.67mcg DAily and add Calcium carbonate /tums to each meal   4. Hypothyroidism continue Synthroid   5. borderline diabetes: hemoglobin A1c 5.9  DVT proph: lovenox   Code Status: FULL Family Communication: d/w pt at bedside Disposition Plan: home soon   Consultants:  Dr.Hewitt, Orthopedics  Procedures:  Toe amputation and drainage of abscess 2/13  Antibiotics:  Vanc/Zosyn 2/12 -->  HPI/Subjective: Doing well, no complaints, ambulating without much problems  Objective: Filed Vitals:   01/05/13 0659 01/05/13 1325 01/05/13 2114 01/06/13 0453  BP: 121/48 126/59 154/51 143/70  Pulse: 61 57 58 58  Temp: 97.6 F (36.4 C) 98.4 F (36.9 C) 99 F (37.2 C) 97.4 F (36.3 C)  TempSrc: Oral  Oral Oral  Resp: 16 18 18 19   Height:      Weight:      SpO2: 100% 99% 100% 99%     Intake/Output Summary (Last 24 hours) at 01/06/13 1120 Last data filed at 01/06/13 0900  Gross per 24 hour  Intake   1680 ml  Output   1750 ml  Net    -70 ml   Filed Weights   01/02/13 2034  Weight: 74.844 kg (165 lb)    Exam:   General:  AAOX3  Cardiovascular: S1S2/RRR  Respiratory: CTAB  Abdomen: soft, nt, bs present  EXt: dressing on R foot post amputation  Data Reviewed: Basic Metabolic Panel:  Recent Labs Lab 01/02/13 1435 01/03/13 0355 01/04/13 0350 01/05/13 0410  NA 131* 132* 133* 137  K 3.9 3.6 3.7 3.5  CL 100 103 104 106  CO2 14* 13* 14* 19  GLUCOSE 152* 99 204* 132*  BUN 47* 50* 48* 37*  CREATININE 2.15* 2.26* 2.41* 2.23*  CALCIUM 5.9* 5.9* 5.7* 5.8*   Liver Function Tests:  Recent Labs Lab 01/02/13 1435 01/03/13 0355  AST 14 14  ALT 13 12  ALKPHOS 72 72  BILITOT 0.4 0.4  PROT 6.5 6.2  ALBUMIN 2.9* 2.6*   No results found for this basename: LIPASE, AMYLASE,  in the last 168 hours No results found for this basename: AMMONIA,  in the last 168 hours CBC:  Recent Labs Lab 01/02/13 1351 01/03/13 0355 01/04/13 0350  WBC 14.8* 11.3* 7.9  NEUTROABS 13.4*  --   --   HGB 10.3* 8.7* 8.2*  HCT 30.4* 26.0* 24.9*  MCV 91.6 91.9 91.5  PLT  203 170 151   Cardiac Enzymes: No results found for this basename: CKTOTAL, CKMB, CKMBINDEX, TROPONINI,  in the last 168 hours BNP (last 3 results) No results found for this basename: PROBNP,  in the last 8760 hours CBG:  Recent Labs Lab 01/03/13 0911 01/03/13 1044  GLUCAP 122* 110*    Recent Results (from the past 240 hour(s))  SURGICAL PCR SCREEN     Status: Abnormal   Collection Time    01/03/13  7:28 AM      Result Value Range Status   MRSA, PCR NEGATIVE  NEGATIVE Final   Staphylococcus aureus POSITIVE (*) NEGATIVE Final   Comment:            The Xpert SA Assay (FDA     approved for NASAL specimens     in patients over 76 years of age),     is one component of     a comprehensive  surveillance     program.  Test performance has     been validated by [employer] for patients greater     than or equal to 70 year old.     It is not intended     to diagnose infection nor to     guide or monitor treatment.  ANAEROBIC CULTURE     Status: None   Collection Time    01/03/13  9:56 AM      Result Value Range Status   Specimen Description TOE RIGHT   Final   Special Requests NONE   Final   Gram Stain     Final   Value: ABUNDANT WBC PRESENT, PREDOMINANTLY PMN     FEW GRAM POSITIVE COCCI     IN PAIRS   Culture     Final   Value: NO ANAEROBES ISOLATED; CULTURE IN PROGRESS FOR 5 DAYS   Report Status PENDING   Incomplete  TISSUE CULTURE     Status: None   Collection Time    01/03/13  9:56 AM      Result Value Range Status   Specimen Description TISSUE   Final   Special Requests NONE   Final   Gram Stain PENDING   Incomplete   Culture Culture reincubated for better growth   Final   Report Status PENDING   Incomplete     Studies: No results found.  Scheduled Meds: . calcitRIOL  0.25 mcg Oral Daily  . calcium carbonate  1 tablet Oral TID WC  . enoxaparin (LOVENOX) injection  30 mg Subcutaneous Q24H  . levothyroxine  88 mcg Oral QAC breakfast  . multivitamin with minerals  1 tablet Oral Daily  . piperacillin-tazobactam (ZOSYN)  IV  3.375 g Intravenous Q8H  . sodium bicarbonate  1,300 mg Oral BID  . vancomycin  500 mg Intravenous Q24H  . [START ON 01/07/2013] Vitamin D (Ergocalciferol)  50,000 Units Oral Q Mon   Continuous Infusions:    Active Problems:   Hypothyroid   BPH (benign prostatic hyperplasia)   Borderline diabetes mellitus   Cellulitis of foot, right   CKD (chronic kidney disease), stage III   Metabolic acidosis, increased anion gap    Time spent: 79min   [facility] Pager [phone]. If 8PM-8AM, please contact night-coverage at [email or web address], password [email or web address] 01/06/2013, 11:20 AM  LOS: 4 days

## 2013-01-06 NOTE — Progress Notes (Signed)
Subjective: Patient denies any significant discomfort in his lower extremities. The only complaint he has just feels like his legs are a little bit colder today. He has been up and about with a shoe on a little activity. He's been eaten without any problems   Objective: Vital signs in last 24 hours: Temp:  [97.4 F (36.3 C)-99 F (37.2 C)] 97.4 F (36.3 C) (02/16 0453) Pulse Rate:  [57-58] 58 (02/16 0453) Resp:  [18-19] 19 (02/16 0453) BP: (126-154)/(51-70) 143/70 mmHg (02/16 0453) SpO2:  [99 %-100 %] 99 % (02/16 0453)  Intake/Output from previous day: 02/15 0701 - 02/16 0700 In: 1440 [P.O.:1440] Out: 1450 [Urine:1450] Intake/Output this shift:     Recent Labs  01/04/13 0350  HGB 8.2*    Recent Labs  01/04/13 0350  WBC 7.9  RBC 2.72*  HCT 24.9*  PLT 151    Recent Labs  01/04/13 0350 01/05/13 0410  NA 133* 137  K 3.7 3.5  CL 104 106  CO2 14* 19  BUN 48* 37*  CREATININE 2.41* 2.23*  GLUCOSE 204* 132*  CALCIUM 5.7* 5.8*   No results found for this basename: LABPT, INR,  in the last 72 hours  Patient is conscious alert and appropriate laying in bed trying to take a nap. His IV antibiotics or running. His right lower extremity is dressing is intact toes are exposed he has no light touch sensation in any of his toes on his right foot his calf is soft and nontender no signs phlebitis. He's got a soft nontender calf on the left he does have some sensation of his toes on the right correction on the left also. His skin on both lower extremities appears to be warm  Assessment/Plan: Postop I&D of infected toe with osteomyelitis cultures pending right foot diabetic infection  Plan at this time are to continue current IV antibiotics. Dressing is intact . Dr Doran Durand will evaluate the patient tomorrow check on cultures for disposition on antibiotics and placement Dr. Georgena Spurling will change the dressing tomorrow   [name] 01/06/2013, 9:17 AM

## 2013-01-07 LAB — ANAEROBIC CULTURE

## 2013-01-07 LAB — BASIC METABOLIC PANEL
CO2: 25 mEq/L (ref 19–32)
Calcium: 6.3 mg/dL — CL (ref 8.4–10.5)
Creatinine, Ser: 2.29 mg/dL — ABNORMAL HIGH (ref 0.50–1.35)
GFR calc non Af Amer: 25 mL/min — ABNORMAL LOW (ref >90)
Sodium: 139 mEq/L (ref 135–145)

## 2013-01-07 LAB — TISSUE CULTURE

## 2013-01-07 MED ORDER — CALCIUM CARBONATE ANTACID 500 MG PO CHEW: 1.0000 | CHEWABLE_TABLET | Freq: Three times a day (TID) | ORAL | Status: DC

## 2013-01-07 MED ORDER — CEPHALEXIN 500 MG PO CAPS: 500.0000 mg | ORAL_CAPSULE | Freq: Four times a day (QID) | ORAL | Status: DC

## 2013-01-07 MED ORDER — SODIUM BICARBONATE 650 MG PO TABS: 1300.0000 mg | ORAL_TABLET | Freq: Two times a day (BID) | ORAL | Status: DC

## 2013-01-07 MED ORDER — HYDROCODONE-ACETAMINOPHEN 5-325 MG PO TABS: 1.0000 | ORAL_TABLET | Freq: Four times a day (QID) | ORAL | Status: DC | PRN

## 2013-01-07 MED ORDER — CALCITRIOL 0.25 MCG PO CAPS: 0.2500 ug | ORAL_CAPSULE | Freq: Every day | ORAL | Status: DC

## 2013-01-07 NOTE — Progress Notes (Signed)
[facility] PROGRESS NOTE  [name]. [id number] DOB: 06/14/1931 DOA: 01/02/2013 PCP: Wyatt Haste, MD  Assessment/Plan: 1. Cellulitis of right foot , with osteomyelitis of second toe  - s/p toe amputation and drainage of plantar abscess per Dr.Hweitt 2/13 - Continue IV vancomycin/Zosyn,  final cultures with few staph only - Wound care  - Pt is blind, lives alone  and would be a poor candidate to administer IV Abx safely via PICC line, hence will Dc home tomorrow per Pts wishes on PO Abx  2. 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  - Discussed with Dr. Mercy Moore renal 2/12, stopped IVF with bicarb yesterday - Also concomitantly continue oral bicarbonate 1300 mg by mouth twice a day which should be continued at discharge  - He should followup with renal   3. Secondary hyperparathyroidism: Metabolic bone disease from CK D, Calcium 5.9 (normal albumin in January), Phos of 5.8.  - received  1gm Calcium gluconate 2/12  - continue Calcitriol 0.4mcg DAily and add Calcium carbonate /tums to each meal   4. Hypothyroidism continue Synthroid   5. borderline diabetes: hemoglobin A1c 5.9  DVT proph: lovenox   Code Status: FULL Family Communication: d/w pt at bedside Disposition Plan: home tomorrow, per pts request   Consultants:  Dr.Hewitt, Orthopedics  Procedures:  Toe amputation and drainage of abscess 2/13  Antibiotics:  Vanc/Zosyn 2/12 -->  HPI/Subjective: Doing well, " Im not ready to go home at this time in the afternoon now"  Objective: Filed Vitals:   01/06/13 1355 01/06/13 2055 01/07/13 0517 01/07/13 1427  BP: 145/67 147/59 147/49 119/59  Pulse: 56 55 57 55  Temp: 98.1 F (36.7 C) 98.8 F (37.1 C) 98.7 F (37.1 C) 98.8 F (37.1 C)  TempSrc: Oral Oral Oral   Resp: 16 16 18 16   Height:       Weight:      SpO2: 100% 100% 100% 99%    Intake/Output Summary (Last 24 hours) at 01/07/13 1716 Last data filed at 01/07/13 1300  Gross per 24 hour  Intake   1380 ml  Output    950 ml  Net    430 ml   Filed Weights   01/02/13 2034  Weight: 74.844 kg (165 lb)    Exam:   General:  AAOX3  Cardiovascular: S1S2/RRR  Respiratory: CTAB  Abdomen: soft, nt, bs present  EXt: dressing on R foot post amputation  Data Reviewed: Basic Metabolic Panel:  Recent Labs Lab 01/02/13 1435 01/03/13 0355 01/04/13 0350 01/05/13 0410 01/07/13 0354  NA 131* 132* 133* 137 139  K 3.9 3.6 3.7 3.5 3.9  CL 100 103 104 106 102  CO2 14* 13* 14* 19 25  GLUCOSE 152* 99 204* 132* 146*  BUN 47* 50* 48* 37* 32*  CREATININE 2.15* 2.26* 2.41* 2.23* 2.29*  CALCIUM 5.9* 5.9* 5.7* 5.8* 6.3*   Liver Function Tests:  Recent Labs Lab 01/02/13 1435 01/03/13 0355  AST 14 14  ALT 13 12  ALKPHOS 72 72  BILITOT 0.4 0.4  PROT 6.5 6.2  ALBUMIN 2.9* 2.6*   No results found for this basename: LIPASE, AMYLASE,  in the last 168 hours No results found for this basename: AMMONIA,  in the last 168 hours CBC:  Recent Labs Lab 01/02/13 1351 01/03/13 0355 01/04/13 0350  WBC  14.8* 11.3* 7.9  NEUTROABS 13.4*  --   --   HGB 10.3* 8.7* 8.2*  HCT 30.4* 26.0* 24.9*  MCV 91.6 91.9 91.5  PLT 203 170 151   Cardiac Enzymes: No results found for this basename: CKTOTAL, CKMB, CKMBINDEX, TROPONINI,  in the last 168 hours BNP (last 3 results) No results found for this basename: PROBNP,  in the last 8760 hours CBG:  Recent Labs Lab 01/03/13 0911 01/03/13 1044  GLUCAP 122* 110*    Recent Results (from the past 240 hour(s))  SURGICAL PCR SCREEN     Status: Abnormal   Collection Time    01/03/13  7:28 AM      Result Value Range Status   MRSA, PCR NEGATIVE  NEGATIVE Final   Staphylococcus aureus POSITIVE (*) NEGATIVE Final   Comment:            The Xpert SA Assay (FDA     approved for NASAL  specimens     in patients over 67 years of age),     is one component of     a comprehensive surveillance     program.  Test performance has     been validated by [employer] for patients greater     than or equal to 37 year old.     It is not intended     to diagnose infection nor to     guide or monitor treatment.  ANAEROBIC CULTURE     Status: None   Collection Time    01/03/13  9:56 AM      Result Value Range Status   Specimen Description TOE RIGHT   Final   Special Requests NONE   Final   Gram Stain     Final   Value: ABUNDANT WBC PRESENT, PREDOMINANTLY PMN     FEW GRAM POSITIVE COCCI     IN PAIRS   Culture NO ANAEROBES ISOLATED   Final   Report Status 01/07/2013 FINAL   Final  TISSUE CULTURE     Status: None   Collection Time    01/03/13  9:56 AM      Result Value Range Status   Specimen Description TISSUE   Final   Special Requests NONE   Final   Gram Stain     Final   Value: ABUNDANT WBC PRESENT, PREDOMINANTLY PMN     FEW GRAM POSITIVE COCCI     IN PAIRS   Culture     Final   Value: FEW STAPHYLOCOCCUS AUREUS     Note: RIFAMPIN AND GENTAMICIN SHOULD NOT BE USED AS SINGLE DRUGS FOR TREATMENT OF STAPH INFECTIONS.     MODERATE GROUP B STREP(S.AGALACTIAE)ISOLATED     Note: TESTING AGAINST S. AGALACTIAE NOT ROUTINELY PERFORMED DUE TO PREDICTABILITY OF AMP/PEN/VAN SUSCEPTIBILITY.   Report Status 01/07/2013 FINAL   Final   Organism ID, Bacteria STAPHYLOCOCCUS AUREUS   Final     Studies: No results found.  Scheduled Meds: . calcitRIOL  0.25 mcg Oral Daily  . calcium carbonate  1 tablet Oral TID WC  . enoxaparin (LOVENOX) injection  30 mg Subcutaneous Q24H  . levothyroxine  88 mcg Oral QAC breakfast  . multivitamin with minerals  1 tablet Oral Daily  . piperacillin-tazobactam (ZOSYN)  IV  3.375 g Intravenous Q8H  . sodium bicarbonate  1,300 mg Oral BID  . vancomycin  500 mg Intravenous Q24H  . Vitamin D (Ergocalciferol)  50,000 Units Oral Q Mon  Continuous  Infusions:    Active Problems:   Hypothyroid   BPH (benign prostatic hyperplasia)   Borderline diabetes mellitus   Cellulitis of foot, right   CKD (chronic kidney disease), stage III   Metabolic acidosis, increased anion gap    Time spent: 55min   [facility] Pager [phone]. If 8PM-8AM, please contact night-coverage at [email or web address], password [email or web address] 01/07/2013, 5:16 PM  LOS: 5 days

## 2013-01-07 NOTE — Progress Notes (Signed)
agree

## 2013-01-07 NOTE — Progress Notes (Signed)
Subjective: 4 Days Post-Op Procedure(s) (LRB): AMPUTATION DIGIT (Right) Patient reports pain as mild and well controlled..    Objective: Vital signs in last 24 hours: Temp:  [98.1 F (36.7 C)-98.8 F (37.1 C)] 98.7 F (37.1 C) (02/17 0517) Pulse Rate:  [55-57] 57 (02/17 0517) Resp:  [16-18] 18 (02/17 0517) BP: (145-147)/(49-67) 147/49 mmHg (02/17 0517) SpO2:  [100 %] 100 % (02/17 0517)  Intake/Output from previous day: 02/16 0701 - 02/17 0700 In: 1860 [P.O.:1860] Out: 950 [Urine:950] Intake/Output this shift: Total I/O In: 240 [P.O.:240] Out: 300 [Urine:300]  No results found for this basename: HGB,  in the last 72 hours No results found for this basename: WBC, RBC, HCT, PLT,  in the last 72 hours  Recent Labs  01/05/13 0410 01/07/13 0354  NA 137 139  K 3.5 3.9  CL 106 102  CO2 19 25  BUN 37* 32*  CREATININE 2.23* 2.29*  GLUCOSE 132* 146*  CALCIUM 5.8* 6.3*   No results found for this basename: LABPT, INR,  in the last 72 hours  R forefoot wound with scant serous drainage.  No purulence.  Skin with some sough distally.  Erythema resolved.  Assessment/Plan: 4 Days Post-Op Procedure(s) (LRB): AMPUTATION DIGIT (Right) In light of patient's refusal of HH IV abx, I recommend discharging him on Keflex 500 mg po qid x 2 weeks.  I'll see him back in clinic in two weeks.  He can be discharged at any time from my perspective.  He can bear weight on the foot in a hard sole shoe and should elevate as much as possible.  Wylene Simmer 01/07/2013, 1:51 PM

## 2013-01-08 MED ORDER — CEPHALEXIN 500 MG PO CAPS: 500.0000 mg | ORAL_CAPSULE | Freq: Four times a day (QID) | ORAL | Status: DC

## 2013-01-08 MED ORDER — HYDROCODONE-ACETAMINOPHEN 5-325 MG PO TABS: 1.0000 | ORAL_TABLET | Freq: Four times a day (QID) | ORAL | Status: DC | PRN

## 2013-01-08 NOTE — Discharge Summary (Signed)
Physician Discharge Summary  [name]. [id number] DOB: [birth year] DOA: 01/02/2013  PCP: Wyatt Haste, MD  Admit date: 01/02/2013 Discharge date: 01/08/2013  Time spent: 35 minutes  Recommendations for Outpatient Follow-up:  1. PCP in 1 week 2. Dr.Hewitt in 2 weeks 3. Nephrologist in 2-3 weeks  Discharge Diagnoses:  R foot Cellulitis Osteomyelitis of 2nd toe on R foot s/p Amputation R plantar foot abscess s/p I&D CKD 4 Anion gap metabolic acidosis Secondary hyperparathyroidism   Hypothyroid   BPH (benign prostatic hyperplasia)   Borderline diabetes mellitus   Cellulitis of foot, right   CKD (chronic kidney disease), stage III   Metabolic acidosis, increased anion gap   Discharge Condition: stable, improved  Diet recommendation: Low Na  Filed Weights   01/02/13 2034  Weight: 74.844 kg (165 lb)    History of present illness:  [name]. is a 79 y.o. male with past history of CKD3, hypothyroidism, BPH presents from home with his son with the above-mentioned complaint. He states that he may have caught his right toenail on something sharp about 6 days ago and nail ripped loose, subsequent to that he noticed increased pain and swelling involving the second toe, this progressed today and spread to the entire foot and lower leg primarily with erythema and swelling.  He went to his PCPs office and was subsequently referred to the emergency room.  Upon evaluation in the ER noted to have worsening of his CKD and metabolic acidosis and leukocytosis along with cellulitis of the right foot.  He denies any fevers, reports chills  Hospital Course:  1. Cellulitis of right foot , with osteomyelitis of second toe  - s/p toe amputation and drainage of plantar abscess per Dr.Hweitt 2/13  - Treated with IV vancomycin/Zosyn for 6 days, final cultures with few staph only  - Since  Pt is blind, lives alone and would be a poor candidate to administer IV Abx safely via  PICC line, hence we felt that it would be safest to discharge on oral antibiotics. - he will Fu with Dr.Hewitt in 2 weeks   2. 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  - Discussed with Dr. Mercy Moore renal 2/12, this is felt to be secondary to CKD, he received IVF with bicarb initially.  - Also concomitantly started on oral bicarbonate 1300 mg by mouth twice a day which should be continued at discharge  - He should followup with renal, this has been emphasized with the patient  3. Secondary hyperparathyroidism: Metabolic bone disease from CK D, Calcium 5.9 (normal albumin in January), Phos of 5.8.  - received 1gm Calcium gluconate 2/12  - continue Calcitriol 0.28mcg DAily and add Calcium carbonate /tums to each meal  - This will need to be addressed further upon follow up with renal  4. Hypothyroidism continue Synthroid   5. borderline diabetes: hemoglobin A1c 5.9   Procedures: by Dr.Hewitt on 01/02/13 1. Right second toe amputation through the MTP joint.  2. Irrigation and debridement of right foot plantar abscess including  multiple bursal areas.  Consultations:  [place]  Telephonic consultation with Dr.Mattingly, Renal  Discharge Exam: Filed Vitals:   01/07/13 0517 01/07/13 1427 01/07/13 2103 01/08/13 0521  BP: 147/49 119/59 130/48 135/50  Pulse: 57 55 58 56  Temp: 98.7 F (37.1 C) 98.8 F (  37.1 C) 98.8 F (37.1 C) 98.3 F (36.8 C)  TempSrc: Oral  Oral Oral  Resp: 18 16 18 16   Height:      Weight:      SpO2: 100% 99% 98% 99%    General: AAOx3 Cardiovascular: S1S2/RRR Respiratory:CTAB  Discharge Instructions  Discharge Orders   Future Appointments Provider Department Dept Phone   01/15/2013 11:15 AM Denita Lung, MD [place] [phone]   01/31/2013 8:15 AM Hayden Pedro, MD [place] [phone]   09/23/2013 9:30 AM [facility] [phone]   Eat a light meal the night before the exam but please avoid gaseous foods.   Nothing to eat or drink for at least 8 hours prior to the exam. No gum chewing or smoking the morning of the exam. Please take your morning medications with small sips of water, especially blood pressure medication. If you have several vascular lab exams and will see physician, please bring a snack with you.   09/23/2013 10:00 AM Princess Perna, NP [facility] [phone]   Future Orders Complete By Expires     Diet - low sodium heart healthy  As directed     Increase activity slowly  As directed         Medication List    STOP taking these medications       triamcinolone cream 0.1 %  Commonly known as:  KENALOG     trimethoprim-polymyxin b ophthalmic solution  Commonly known as:  POLYTRIM      TAKE these medications       calcitRIOL 0.25 MCG capsule  Commonly known as:  ROCALTROL  Take 1 capsule (0.25 mcg total) by mouth daily.     calcium carbonate 500 MG chewable tablet  Commonly known as:  TUMS - dosed in mg elemental calcium  Chew 1 tablet (200 mg of elemental calcium total) by mouth 3 (three) times daily with meals.     cephALEXin 500 MG capsule  Commonly known as:  KEFLEX  Take 1 capsule (500 mg total) by mouth 4 (four) times daily. For 2 weeks     HYDROcodone-acetaminophen 5-325 MG per tablet  Commonly known as:  NORCO/VICODIN  Take 1 tablet by mouth every 6 (six) hours as needed.     levothyroxine 88 MCG tablet  Commonly known as:  SYNTHROID, LEVOTHROID  Take 88 mcg by mouth every morning.     multivitamin with minerals tablet  Take 1 tablet by mouth daily.     sodium bicarbonate 650 MG tablet  Take 2 tablets (1,300 mg total) by mouth 2 (two) times daily.     Vitamin D (Ergocalciferol) 50000 UNITS Caps  Commonly known  as:  DRISDOL  Take 50,000 Units by mouth every Monday.           Follow-up Information   Follow up with HEWITT, Jenny Reichmann, MD. Schedule an appointment as soon as possible for a visit in 2 weeks.   Contact information:   [address] [place] [address] [phone]       Follow up with Wyatt Haste, MD In 1 week.   Contact information:   [place] [address] [phone]        The results of significant diagnostics from this hospitalization (including imaging, microbiology, ancillary and laboratory) are listed below for reference.    Significant Diagnostic Studies: Dg Toe 2nd Right  01/02/2013  *RADIOLOGY REPORT*  Clinical Data: Nonhealing wound  RIGTH SECOND TOE  Comparison: None.  Findings: Three views right second toe submitted. There is bone destruction with erosive appearance at the tip of distal phalanx. There is associated soft tissue swelling with small amount of soft tissue air.  Findings are highly suspicious for osteomyelitis. Clinical correlation is necessary.  IMPRESSION: Findings suspicious for osteomyelitis at the tip of distal phalanx right second toe.   Original Report Authenticated By: Lahoma Crocker, M.D.     Microbiology: Recent Results (from the past 240 hour(s))  SURGICAL PCR SCREEN     Status: Abnormal   Collection Time    01/03/13  7:28 AM      Result Value Range Status   MRSA, PCR NEGATIVE  NEGATIVE Final   Staphylococcus aureus POSITIVE (*) NEGATIVE Final   Comment:            The Xpert SA Assay (FDA     approved for NASAL specimens     in patients over 3 years of age),     is one component of     a comprehensive surveillance     program.  Test performance has     been validated by [employer] for patients greater     than or equal to 76 year old.     It is not intended     to diagnose infection nor to     guide or monitor treatment.  ANAEROBIC CULTURE     Status: None   Collection Time     01/03/13  9:56 AM      Result Value Range Status   Specimen Description TOE RIGHT   Final   Special Requests NONE   Final   Gram Stain     Final   Value: ABUNDANT WBC PRESENT, PREDOMINANTLY PMN     FEW GRAM POSITIVE COCCI     IN PAIRS   Culture NO ANAEROBES ISOLATED   Final   Report Status 01/07/2013 FINAL   Final  TISSUE CULTURE     Status: None   Collection Time    01/03/13  9:56 AM      Result Value Range Status   Specimen Description TISSUE   Final   Special Requests NONE   Final   Gram Stain     Final   Value: ABUNDANT WBC PRESENT, PREDOMINANTLY PMN     FEW GRAM POSITIVE COCCI     IN PAIRS   Culture     Final   Value: FEW STAPHYLOCOCCUS AUREUS     Note: RIFAMPIN AND GENTAMICIN SHOULD NOT BE USED AS SINGLE DRUGS FOR TREATMENT OF STAPH INFECTIONS.     MODERATE GROUP B STREP(S.AGALACTIAE)ISOLATED     Note: TESTING AGAINST S. AGALACTIAE NOT ROUTINELY PERFORMED DUE TO PREDICTABILITY OF AMP/PEN/VAN SUSCEPTIBILITY.   Report Status 01/07/2013 FINAL   Final   Organism ID, Bacteria STAPHYLOCOCCUS AUREUS   Final     Labs: Basic Metabolic Panel:  Recent Labs Lab 01/02/13 1435 01/03/13 0355 01/04/13 0350 01/05/13 0410 01/07/13 0354  NA 131* 132* 133* 137 139  K 3.9 3.6 3.7 3.5 3.9  CL 100 103 104 106 102  CO2 14* 13* 14* 19 25  GLUCOSE 152* 99 204* 132* 146*  BUN 47* 50* 48* 37* 32*  CREATININE 2.15* 2.26* 2.41* 2.23* 2.29*  CALCIUM 5.9* 5.9* 5.7* 5.8* 6.3*   Liver Function Tests:  Recent Labs Lab 01/02/13 1435 01/03/13 0355  AST 14 14  ALT 13 12  ALKPHOS 72 72  BILITOT 0.4 0.4  PROT 6.5 6.2  ALBUMIN 2.9* 2.6*   No results found for this basename: LIPASE, AMYLASE,  in the last 168 hours No results found for this basename: AMMONIA,  in the last 168 hours CBC:  Recent Labs Lab 01/02/13 1351 01/03/13 0355 01/04/13 0350  WBC 14.8* 11.3* 7.9  NEUTROABS 13.4*  --   --   HGB 10.3* 8.7* 8.2*  HCT 30.4* 26.0* 24.9*  MCV 91.6 91.9 91.5  PLT 203 170 151    Cardiac Enzymes: No results found for this basename: CKTOTAL, CKMB, CKMBINDEX, TROPONINI,  in the last 168 hours BNP: BNP (last 3 results) No results found for this basename: PROBNP,  in the last 8760 hours CBG:  Recent Labs Lab 01/03/13 0911 01/03/13 1044  GLUCAP 122* 110*       Signed:  Parissa Chiao  [facility] 01/08/2013, 1:43 PM

## 2013-01-08 NOTE — Plan of Care (Signed)
Problem: Discharge Progression Outcomes Goal: Discharge plan in place and appropriate Outcome: Completed/Met Date Met:  01/08/13 Pt lives with son, who will be picking him and taking him home. Discharge instructions explained to son and pt. Informed both that he can get his prescriptions at [facility] and the cost was $8.00 for the antibiotic.

## 2013-01-08 NOTE — Progress Notes (Addendum)
Spoke with pt concerning discharge plans. Pt agreed with [facility] for teaching: medications, infection, dressing change. Pt selected [place] for [facility] needs, referral given to in house rep. Pt can get ABX filled at [facility] for $8.00. This information was given to the pt.

## 2013-01-15 ENCOUNTER — Encounter: Payer: Self-pay | Admitting: Family Medicine

## 2013-01-15 ENCOUNTER — Ambulatory Visit (INDEPENDENT_AMBULATORY_CARE_PROVIDER_SITE_OTHER): Payer: Medicare Other | Admitting: Family Medicine

## 2013-01-15 VITALS — BP 140/70 | HR 62 | Wt 167.0 lb

## 2013-01-15 DIAGNOSIS — E559 Vitamin D deficiency, unspecified: Secondary | ICD-10-CM

## 2013-01-15 LAB — COMPREHENSIVE METABOLIC PANEL
ALT: 19 U/L (ref 0–53)
AST: 19 U/L (ref 0–37)
Albumin: 3.5 g/dL (ref 3.5–5.2)
Alkaline Phosphatase: 86 U/L (ref 39–117)
BUN: 40 mg/dL — ABNORMAL HIGH (ref 6–23)
Calcium: 6.7 mg/dL — ABNORMAL LOW (ref 8.4–10.5)
Chloride: 105 mEq/L (ref 96–112)
Potassium: 4.9 mEq/L (ref 3.5–5.3)
Sodium: 140 mEq/L (ref 135–145)
Total Protein: 6.6 g/dL (ref 6.0–8.3)

## 2013-01-15 LAB — CBC WITH DIFFERENTIAL/PLATELET
Basophils Absolute: 0 10*3/uL (ref 0.0–0.1)
MCH: 30 pg (ref 26.0–34.0)
MCHC: 33.2 g/dL (ref 30.0–36.0)
MCV: 90.2 fL (ref 78.0–100.0)
Monocytes Absolute: 0.4 10*3/uL (ref 0.1–1.0)
Platelets: 245 10*3/uL (ref 150–400)
RBC: 3.07 MIL/uL — ABNORMAL LOW (ref 4.22–5.81)

## 2013-01-15 NOTE — Progress Notes (Signed)
  Subjective:    Patient ID: [name]., male    DOB: October 27, 1931, 79 y.o.   MRN: [id number]  HPI He is here for followup visit after recent hospitalization and treatment for cellulitis as well as osteomyelitis of the second toe. He states he is doing better having less difficulty with cramping. He continues on the antibiotic and plans to see the orthopedic surgeon in the near future. He also recently finished taking vitamin D supplements. He also states his throat is hurting a lot less.he continues on various oral dietary supplements including calcium.   Review of Systems     Objective:   Physical Exam Alert and in no distress. Right foot is wrapped and therefore unable to be evaluated.       Assessment & Plan:  Cellulitis of foot, right - Plan: CBC with Differential  Unspecified vitamin D deficiency - Plan: Vitamin D 25 hydroxy  Encounter for long-term (current) use of other medications - Plan: CBC with Differential, Comprehensive metabolic panel, Vitamin D 25 hydroxy  CKD (chronic kidney disease), stage III

## 2013-01-16 ENCOUNTER — Other Ambulatory Visit: Payer: Self-pay

## 2013-01-16 NOTE — Progress Notes (Signed)
Quick Note:  Left message for pt at sons # word for word   His numbers are improving. Have him continue on 50,000 units of vitamin D weekly and let me recheck these numbers in 2 months the   ______

## 2013-01-16 NOTE — Progress Notes (Signed)
Quick Note:  His numbers are improving. Have him continue on 50,000 units of vitamin D weekly and let me recheck these numbers in 2 months the ______

## 2013-01-31 ENCOUNTER — Encounter (INDEPENDENT_AMBULATORY_CARE_PROVIDER_SITE_OTHER): Payer: Medicare Other | Admitting: Ophthalmology

## 2013-01-31 DIAGNOSIS — H2510 Age-related nuclear cataract, unspecified eye: Secondary | ICD-10-CM

## 2013-01-31 DIAGNOSIS — I10 Essential (primary) hypertension: Secondary | ICD-10-CM

## 2013-03-01 ENCOUNTER — Encounter (INDEPENDENT_AMBULATORY_CARE_PROVIDER_SITE_OTHER): Payer: Medicare Other | Admitting: Ophthalmology

## 2013-03-01 DIAGNOSIS — H353290 Exudative age-related macular degeneration, unspecified eye, stage unspecified: Secondary | ICD-10-CM

## 2013-03-01 DIAGNOSIS — H2510 Age-related nuclear cataract, unspecified eye: Secondary | ICD-10-CM

## 2013-03-01 DIAGNOSIS — I10 Essential (primary) hypertension: Secondary | ICD-10-CM

## 2013-03-01 DIAGNOSIS — H43819 Vitreous degeneration, unspecified eye: Secondary | ICD-10-CM

## 2013-03-01 DIAGNOSIS — H3530 Unspecified macular degeneration: Secondary | ICD-10-CM

## 2013-03-01 DIAGNOSIS — H35039 Hypertensive retinopathy, unspecified eye: Secondary | ICD-10-CM

## 2013-04-02 ENCOUNTER — Other Ambulatory Visit: Payer: Self-pay | Admitting: Family Medicine

## 2013-04-05 ENCOUNTER — Encounter (INDEPENDENT_AMBULATORY_CARE_PROVIDER_SITE_OTHER): Payer: Medicare Other | Admitting: Ophthalmology

## 2013-04-05 DIAGNOSIS — H35039 Hypertensive retinopathy, unspecified eye: Secondary | ICD-10-CM

## 2013-04-05 DIAGNOSIS — H43819 Vitreous degeneration, unspecified eye: Secondary | ICD-10-CM

## 2013-04-05 DIAGNOSIS — H353290 Exudative age-related macular degeneration, unspecified eye, stage unspecified: Secondary | ICD-10-CM

## 2013-04-05 DIAGNOSIS — I10 Essential (primary) hypertension: Secondary | ICD-10-CM

## 2013-04-05 DIAGNOSIS — H3530 Unspecified macular degeneration: Secondary | ICD-10-CM

## 2013-05-10 ENCOUNTER — Encounter (INDEPENDENT_AMBULATORY_CARE_PROVIDER_SITE_OTHER): Payer: Medicare Other | Admitting: Ophthalmology

## 2013-05-17 ENCOUNTER — Encounter (INDEPENDENT_AMBULATORY_CARE_PROVIDER_SITE_OTHER): Payer: Medicare Other | Admitting: Ophthalmology

## 2013-05-17 DIAGNOSIS — H43819 Vitreous degeneration, unspecified eye: Secondary | ICD-10-CM

## 2013-05-17 DIAGNOSIS — H3530 Unspecified macular degeneration: Secondary | ICD-10-CM

## 2013-05-17 DIAGNOSIS — H353290 Exudative age-related macular degeneration, unspecified eye, stage unspecified: Secondary | ICD-10-CM

## 2013-05-17 DIAGNOSIS — I10 Essential (primary) hypertension: Secondary | ICD-10-CM

## 2013-05-17 DIAGNOSIS — H35039 Hypertensive retinopathy, unspecified eye: Secondary | ICD-10-CM

## 2013-06-14 ENCOUNTER — Encounter (INDEPENDENT_AMBULATORY_CARE_PROVIDER_SITE_OTHER): Payer: Medicare Other | Admitting: Ophthalmology

## 2013-06-14 DIAGNOSIS — I10 Essential (primary) hypertension: Secondary | ICD-10-CM

## 2013-06-14 DIAGNOSIS — H353290 Exudative age-related macular degeneration, unspecified eye, stage unspecified: Secondary | ICD-10-CM

## 2013-06-14 DIAGNOSIS — H43819 Vitreous degeneration, unspecified eye: Secondary | ICD-10-CM

## 2013-06-14 DIAGNOSIS — H2510 Age-related nuclear cataract, unspecified eye: Secondary | ICD-10-CM

## 2013-06-14 DIAGNOSIS — H3530 Unspecified macular degeneration: Secondary | ICD-10-CM

## 2013-06-14 DIAGNOSIS — H35039 Hypertensive retinopathy, unspecified eye: Secondary | ICD-10-CM

## 2013-07-19 ENCOUNTER — Encounter (INDEPENDENT_AMBULATORY_CARE_PROVIDER_SITE_OTHER): Payer: Medicare Other | Admitting: Ophthalmology

## 2013-07-19 DIAGNOSIS — H353290 Exudative age-related macular degeneration, unspecified eye, stage unspecified: Secondary | ICD-10-CM

## 2013-07-19 DIAGNOSIS — I10 Essential (primary) hypertension: Secondary | ICD-10-CM

## 2013-07-19 DIAGNOSIS — H43819 Vitreous degeneration, unspecified eye: Secondary | ICD-10-CM

## 2013-07-19 DIAGNOSIS — H3530 Unspecified macular degeneration: Secondary | ICD-10-CM

## 2013-07-19 DIAGNOSIS — H35039 Hypertensive retinopathy, unspecified eye: Secondary | ICD-10-CM

## 2013-08-07 ENCOUNTER — Emergency Department (HOSPITAL_BASED_OUTPATIENT_CLINIC_OR_DEPARTMENT_OTHER): Payer: Medicare Other

## 2013-08-07 ENCOUNTER — Encounter (HOSPITAL_BASED_OUTPATIENT_CLINIC_OR_DEPARTMENT_OTHER): Payer: Self-pay | Admitting: Emergency Medicine

## 2013-08-07 ENCOUNTER — Inpatient Hospital Stay (HOSPITAL_BASED_OUTPATIENT_CLINIC_OR_DEPARTMENT_OTHER)
Admission: EM | Admit: 2013-08-07 | Discharge: 2013-08-12 | DRG: 481 | Disposition: A | Discharge status: Skilled Nursing Facility | Payer: Medicare Other | Attending: Internal Medicine | Admitting: Internal Medicine

## 2013-08-07 ENCOUNTER — Inpatient Hospital Stay (HOSPITAL_COMMUNITY): Payer: Medicare Other

## 2013-08-07 DIAGNOSIS — D62 Acute posthemorrhagic anemia: Secondary | ICD-10-CM | POA: Clinically undetermined

## 2013-08-07 DIAGNOSIS — D638 Anemia in other chronic diseases classified elsewhere: Secondary | ICD-10-CM

## 2013-08-07 DIAGNOSIS — R7303 Prediabetes: Secondary | ICD-10-CM | POA: Diagnosis present

## 2013-08-07 DIAGNOSIS — E872 Acidosis, unspecified: Secondary | ICD-10-CM | POA: Diagnosis present

## 2013-08-07 DIAGNOSIS — E8729 Other acidosis: Secondary | ICD-10-CM | POA: Diagnosis present

## 2013-08-07 DIAGNOSIS — K5900 Constipation, unspecified: Secondary | ICD-10-CM | POA: Diagnosis present

## 2013-08-07 DIAGNOSIS — L03115 Cellulitis of right lower limb: Secondary | ICD-10-CM

## 2013-08-07 DIAGNOSIS — J410 Simple chronic bronchitis: Secondary | ICD-10-CM

## 2013-08-07 DIAGNOSIS — E039 Hypothyroidism, unspecified: Secondary | ICD-10-CM | POA: Diagnosis present

## 2013-08-07 DIAGNOSIS — N183 Chronic kidney disease, stage 3 unspecified: Secondary | ICD-10-CM

## 2013-08-07 DIAGNOSIS — Z87891 Personal history of nicotine dependence: Secondary | ICD-10-CM

## 2013-08-07 DIAGNOSIS — N401 Enlarged prostate with lower urinary tract symptoms: Secondary | ICD-10-CM | POA: Diagnosis present

## 2013-08-07 DIAGNOSIS — I129 Hypertensive chronic kidney disease with stage 1 through stage 4 chronic kidney disease, or unspecified chronic kidney disease: Secondary | ICD-10-CM | POA: Diagnosis present

## 2013-08-07 DIAGNOSIS — N138 Other obstructive and reflux uropathy: Secondary | ICD-10-CM | POA: Diagnosis present

## 2013-08-07 DIAGNOSIS — R7302 Impaired glucose tolerance (oral): Secondary | ICD-10-CM

## 2013-08-07 DIAGNOSIS — R7309 Other abnormal glucose: Secondary | ICD-10-CM

## 2013-08-07 DIAGNOSIS — J310 Chronic rhinitis: Secondary | ICD-10-CM

## 2013-08-07 DIAGNOSIS — D696 Thrombocytopenia, unspecified: Secondary | ICD-10-CM | POA: Diagnosis present

## 2013-08-07 DIAGNOSIS — Y92009 Unspecified place in unspecified non-institutional (private) residence as the place of occurrence of the external cause: Secondary | ICD-10-CM

## 2013-08-07 DIAGNOSIS — N400 Enlarged prostate without lower urinary tract symptoms: Secondary | ICD-10-CM | POA: Diagnosis present

## 2013-08-07 DIAGNOSIS — S72002A Fracture of unspecified part of neck of left femur, initial encounter for closed fracture: Secondary | ICD-10-CM

## 2013-08-07 DIAGNOSIS — M1710 Unilateral primary osteoarthritis, unspecified knee: Secondary | ICD-10-CM | POA: Diagnosis present

## 2013-08-07 DIAGNOSIS — S72009A Fracture of unspecified part of neck of unspecified femur, initial encounter for closed fracture: Principal | ICD-10-CM

## 2013-08-07 DIAGNOSIS — W010XXA Fall on same level from slipping, tripping and stumbling without subsequent striking against object, initial encounter: Secondary | ICD-10-CM | POA: Diagnosis present

## 2013-08-07 DIAGNOSIS — R339 Retention of urine, unspecified: Secondary | ICD-10-CM | POA: Diagnosis present

## 2013-08-07 HISTORY — DX: Acute pancreatitis without necrosis or infection, unspecified: K85.90

## 2013-08-07 HISTORY — DX: Essential (primary) hypertension: I10

## 2013-08-07 LAB — CBC WITH DIFFERENTIAL/PLATELET
Basophils Relative: 0 % (ref 0–1)
Eosinophils Absolute: 0.1 10*3/uL (ref 0.0–0.7)
Eosinophils Relative: 2 % (ref 0–5)
MCH: 31.8 pg (ref 26.0–34.0)
MCHC: 33.1 g/dL (ref 30.0–36.0)
MCV: 96.2 fL (ref 78.0–100.0)
Neutrophils Relative %: 73 % (ref 43–77)
Platelets: 93 10*3/uL — ABNORMAL LOW (ref 150–400)
RDW: 14.9 % (ref 11.5–15.5)

## 2013-08-07 LAB — CBC
HCT: 26.7 % — ABNORMAL LOW (ref 39.0–52.0)
Hemoglobin: 9.2 g/dL — ABNORMAL LOW (ref 13.0–17.0)
MCH: 31.9 pg (ref 26.0–34.0)
MCHC: 34.5 g/dL (ref 30.0–36.0)
RBC: 2.88 MIL/uL — ABNORMAL LOW (ref 4.22–5.81)

## 2013-08-07 LAB — HEMOGLOBIN A1C
Hgb A1c MFr Bld: 5.5 % (ref <5.7)
Mean Plasma Glucose: 111 mg/dL (ref <117)

## 2013-08-07 LAB — GLUCOSE, CAPILLARY: Glucose-Capillary: 117 mg/dL — ABNORMAL HIGH (ref 70–99)

## 2013-08-07 LAB — URINALYSIS, ROUTINE W REFLEX MICROSCOPIC
Bilirubin Urine: NEGATIVE
Ketones, ur: NEGATIVE mg/dL
Nitrite: NEGATIVE
Urobilinogen, UA: 0.2 mg/dL (ref 0.0–1.0)

## 2013-08-07 LAB — BASIC METABOLIC PANEL
BUN: 37 mg/dL — ABNORMAL HIGH (ref 6–23)
Calcium: 7.9 mg/dL — ABNORMAL LOW (ref 8.4–10.5)
Chloride: 109 mEq/L (ref 96–112)
Creatinine, Ser: 1.8 mg/dL — ABNORMAL HIGH (ref 0.50–1.35)
GFR calc Af Amer: 39 mL/min — ABNORMAL LOW (ref >90)

## 2013-08-07 LAB — SURGICAL PCR SCREEN: MRSA, PCR: NEGATIVE

## 2013-08-07 LAB — CREATININE, SERUM: Creatinine, Ser: 1.81 mg/dL — ABNORMAL HIGH (ref 0.50–1.35)

## 2013-08-07 MED ORDER — SODIUM CHLORIDE 0.9 % IV SOLN
Freq: Once | INTRAVENOUS | Status: AC
  Administered 2013-08-07: 05:00:00 via INTRAVENOUS

## 2013-08-07 MED ORDER — CEFAZOLIN SODIUM 1-5 GM-% IV SOLN
1.0000 g | Freq: Once | INTRAVENOUS | Status: AC
  Administered 2013-08-07: 1 g via INTRAVENOUS
  Filled 2013-08-07: qty 50

## 2013-08-07 MED ORDER — FENTANYL CITRATE 0.05 MG/ML IJ SOLN
50.0000 ug | Freq: Once | INTRAMUSCULAR | Status: AC
  Administered 2013-08-07: 50 ug via INTRAVENOUS
  Filled 2013-08-07: qty 2

## 2013-08-07 MED ORDER — CYCLOBENZAPRINE HCL 10 MG PO TABS
5.0000 mg | ORAL_TABLET | Freq: Three times a day (TID) | ORAL | Status: DC | PRN
  Administered 2013-08-07: 5 mg via ORAL
  Filled 2013-08-07 (×2): qty 1

## 2013-08-07 MED ORDER — MORPHINE SULFATE 4 MG/ML IJ SOLN
4.0000 mg | Freq: Once | INTRAMUSCULAR | Status: AC
  Administered 2013-08-07: 4 mg via INTRAVENOUS
  Filled 2013-08-07: qty 1

## 2013-08-07 MED ORDER — TETANUS-DIPHTH-ACELL PERTUSSIS 5-2.5-18.5 LF-MCG/0.5 IM SUSP
0.5000 mL | Freq: Once | INTRAMUSCULAR | Status: DC
  Filled 2013-08-07: qty 0.5

## 2013-08-07 MED ORDER — METHOCARBAMOL 100 MG/ML IJ SOLN: 500.0000 mg | Freq: Four times a day (QID) | INTRAVENOUS | Status: DC | PRN

## 2013-08-07 MED ORDER — METHOCARBAMOL 100 MG/ML IJ SOLN
1000.0000 mg | Freq: Four times a day (QID) | INTRAVENOUS | Status: DC | PRN
  Filled 2013-08-07: qty 10

## 2013-08-07 MED ORDER — INSULIN ASPART 100 UNIT/ML ~~LOC~~ SOLN
0.0000 [IU] | Freq: Three times a day (TID) | SUBCUTANEOUS | Status: DC
  Administered 2013-08-09: 1 [IU] via SUBCUTANEOUS
  Administered 2013-08-09: 6 [IU] via SUBCUTANEOUS
  Administered 2013-08-10: 2 [IU] via SUBCUTANEOUS
  Administered 2013-08-11: 5 [IU] via SUBCUTANEOUS
  Administered 2013-08-12: 1 [IU] via SUBCUTANEOUS

## 2013-08-07 MED ORDER — CALCITRIOL 0.25 MCG PO CAPS
0.2500 ug | ORAL_CAPSULE | Freq: Every day | ORAL | Status: DC
  Administered 2013-08-07 – 2013-08-12 (×5): 0.25 ug via ORAL
  Filled 2013-08-07 (×6): qty 1

## 2013-08-07 MED ORDER — SODIUM BICARBONATE 650 MG PO TABS
1300.0000 mg | ORAL_TABLET | Freq: Two times a day (BID) | ORAL | Status: DC
Start: 2013-08-07 — End: 2013-08-12
  Administered 2013-08-07 – 2013-08-12 (×10): 1300 mg via ORAL
  Filled 2013-08-07 (×12): qty 2

## 2013-08-07 MED ORDER — CALCIUM CARBONATE ANTACID 500 MG PO CHEW
1.0000 | CHEWABLE_TABLET | Freq: Three times a day (TID) | ORAL | Status: DC
  Administered 2013-08-07 – 2013-08-12 (×13): 200 mg via ORAL
  Filled 2013-08-07 (×20): qty 1

## 2013-08-07 MED ORDER — HEPARIN SODIUM (PORCINE) 5000 UNIT/ML IJ SOLN
5000.0000 [IU] | Freq: Three times a day (TID) | INTRAMUSCULAR | Status: DC
  Administered 2013-08-07: 5000 [IU] via SUBCUTANEOUS
  Filled 2013-08-07 (×7): qty 1

## 2013-08-07 MED ORDER — SODIUM CHLORIDE 0.9 % IJ SOLN: 3.0000 mL | INTRAMUSCULAR | Status: DC | PRN

## 2013-08-07 MED ORDER — ACETAMINOPHEN 500 MG PO TABS: 500.0000 mg | ORAL_TABLET | Freq: Four times a day (QID) | ORAL | Status: DC | PRN

## 2013-08-07 MED ORDER — CEFAZOLIN SODIUM-DEXTROSE 2-3 GM-% IV SOLR
2.0000 g | Freq: Once | INTRAVENOUS | Status: AC
  Administered 2013-08-08: 2 g via INTRAVENOUS
  Filled 2013-08-07: qty 50

## 2013-08-07 MED ORDER — HYDROCODONE-ACETAMINOPHEN 5-325 MG PO TABS: 1.0000 | ORAL_TABLET | Freq: Four times a day (QID) | ORAL | Status: DC | PRN

## 2013-08-07 MED ORDER — SODIUM CHLORIDE 0.9 % IV SOLN: 250.0000 mL | INTRAVENOUS | Status: DC | PRN

## 2013-08-07 MED ORDER — HYDROCODONE-ACETAMINOPHEN 5-325 MG PO TABS
1.0000 | ORAL_TABLET | ORAL | Status: DC | PRN
  Administered 2013-08-07 – 2013-08-12 (×8): 2 via ORAL
  Filled 2013-08-07 (×8): qty 2

## 2013-08-07 MED ORDER — INSULIN ASPART 100 UNIT/ML ~~LOC~~ SOLN
3.0000 [IU] | Freq: Three times a day (TID) | SUBCUTANEOUS | Status: DC
  Administered 2013-08-09 – 2013-08-12 (×6): 3 [IU] via SUBCUTANEOUS
  Administered 2013-08-12: 09:00:00 via SUBCUTANEOUS

## 2013-08-07 MED ORDER — HYDROMORPHONE HCL PF 1 MG/ML IJ SOLN
INTRAMUSCULAR | Status: AC
  Filled 2013-08-07: qty 1

## 2013-08-07 MED ORDER — HYDROMORPHONE HCL PF 1 MG/ML IJ SOLN
0.2500 mg | INTRAMUSCULAR | Status: DC | PRN
  Administered 2013-08-07: 0.5 mg via INTRAVENOUS

## 2013-08-07 MED ORDER — LEVOTHYROXINE SODIUM 88 MCG PO TABS
88.0000 ug | ORAL_TABLET | Freq: Every day | ORAL | Status: DC
  Administered 2013-08-09 – 2013-08-12 (×4): 88 ug via ORAL
  Filled 2013-08-07 (×7): qty 1

## 2013-08-07 MED ORDER — FENTANYL CITRATE 0.05 MG/ML IJ SOLN
50.0000 ug | Freq: Once | INTRAMUSCULAR | Status: AC
  Administered 2013-08-07: 50 ug via INTRAVENOUS

## 2013-08-07 MED ORDER — ONDANSETRON HCL 4 MG PO TABS: 4.0000 mg | ORAL_TABLET | Freq: Four times a day (QID) | ORAL | Status: DC | PRN

## 2013-08-07 MED ORDER — MORPHINE SULFATE 4 MG/ML IJ SOLN: 4.0000 mg | INTRAMUSCULAR | Status: DC | PRN

## 2013-08-07 MED ORDER — METHOCARBAMOL 500 MG PO TABS
500.0000 mg | ORAL_TABLET | Freq: Four times a day (QID) | ORAL | Status: DC | PRN
  Administered 2013-08-07 – 2013-08-10 (×6): 1000 mg via ORAL
  Administered 2013-08-10: 500 mg via ORAL
  Administered 2013-08-11 – 2013-08-12 (×2): 1000 mg via ORAL
  Filled 2013-08-07 (×4): qty 2
  Filled 2013-08-07: qty 1
  Filled 2013-08-07 (×4): qty 2

## 2013-08-07 MED ORDER — SODIUM CHLORIDE 0.9 % IJ SOLN
3.0000 mL | Freq: Two times a day (BID) | INTRAMUSCULAR | Status: DC
  Administered 2013-08-07 – 2013-08-11 (×8): 3 mL via INTRAVENOUS

## 2013-08-07 MED ORDER — POLYETHYLENE GLYCOL 3350 17 G PO PACK
17.0000 g | PACK | Freq: Every day | ORAL | Status: DC | PRN
  Filled 2013-08-07: qty 1

## 2013-08-07 MED ORDER — METHOCARBAMOL 500 MG PO TABS: 500.0000 mg | ORAL_TABLET | Freq: Four times a day (QID) | ORAL | Status: DC | PRN

## 2013-08-07 MED ORDER — SODIUM CHLORIDE 0.9 % IV SOLN
INTRAVENOUS | Status: AC
  Administered 2013-08-08 (×2): via INTRAVENOUS

## 2013-08-07 MED ORDER — ONDANSETRON HCL 4 MG/2ML IJ SOLN: 4.0000 mg | Freq: Four times a day (QID) | INTRAMUSCULAR | Status: DC | PRN

## 2013-08-07 NOTE — ED Notes (Signed)
MD at bedside. 

## 2013-08-07 NOTE — Progress Notes (Signed)
OT Cancellation Note  Patient Details Name: [name]. MRN: [id number] DOB: 1931-05-15   Cancelled Treatment:    Reason Eval/Treat Not Completed: Medical issues which prohibited therapy. Pt on bedrest  Britt Bottom 08/07/2013, 11:05 AM

## 2013-08-07 NOTE — ED Notes (Signed)
Pt slipped and fell tonight. C/o left hip and upper thigh pain. Pt has skin tear on left elbow.

## 2013-08-07 NOTE — Progress Notes (Signed)
UR COMPLETED  

## 2013-08-07 NOTE — ED Notes (Signed)
[facility] here for transport at this time.  

## 2013-08-07 NOTE — ED Provider Notes (Signed)
CSN: [id number]     Arrival date & time 08/07/13  0254 History   First MD Initiated Contact with Patient 08/07/13 0257     Chief Complaint  Patient presents with  . Fall  . Hip Pain   (Consider location/radiation/quality/duration/timing/severity/associated sxs/prior Treatment) Patient is a 79 y.o. male presenting with fall and hip pain. The history is provided by the patient.  Fall This is a new problem. The current episode started less than 1 hour ago. The problem occurs constantly. The problem has not changed since onset.Pertinent negatives include no chest pain, no abdominal pain, no headaches and no shortness of breath. Nothing aggravates the symptoms. Nothing relieves the symptoms. He has tried nothing for the symptoms. The treatment provided no relief.  Hip Pain Pertinent negatives include no chest pain, no abdominal pain, no headaches and no shortness of breath.    Past Medical History  Diagnosis Date  . Hypothyroid   . BPH (benign prostatic hyperplasia)   . Renal insufficiency   . Glucose intolerance (impaired glucose tolerance)   . Complication of anesthesia     "a tough time"  -pt unable to explain   Past Surgical History  Procedure Laterality Date  . Abdominal aortic aneurysm repair    . Prostate surgery    . Amputation Right 01/03/2013    Procedure: AMPUTATION DIGIT;  Surgeon: Wylene Simmer, MD;  Location: [facility] ORS;  Service: Orthopedics;  Laterality: Right;  RIGHT 2ND TOE AMPUTATION   No family history on file. History  Substance Use Topics  . Smoking status: Former Smoker -- 0.30 packs/day for 60 years    Quit date: 10/02/2012  . Smokeless tobacco: Never Used  . Alcohol Use: Yes    Review of Systems  Respiratory: Negative for shortness of breath.   Cardiovascular: Negative for chest pain.  Gastrointestinal: Negative for abdominal pain.  Neurological: Negative for headaches.  All other systems reviewed and are negative.    Allergies  Aspirin  Home  Medications   Current Outpatient Rx  Name  Route  Sig  Dispense  Refill  . levothyroxine (SYNTHROID, LEVOTHROID) 88 MCG tablet   Oral   Take 88 mcg by mouth every morning.         . Multiple Vitamins-Minerals (MULTIVITAMIN WITH MINERALS) tablet   Oral   Take 1 tablet by mouth daily.         . Vitamin D, Ergocalciferol, (DRISDOL) 50000 UNITS CAPS   Oral   Take 50,000 Units by mouth every Monday.         . calcitRIOL (ROCALTROL) 0.25 MCG capsule   Oral   Take 1 capsule (0.25 mcg total) by mouth daily.   30 capsule   0   . calcium carbonate (TUMS - DOSED IN MG ELEMENTAL CALCIUM) 500 MG chewable tablet   Oral   Chew 1 tablet (200 mg of elemental calcium total) by mouth 3 (three) times daily with meals.   90 tablet   0   . cephALEXin (KEFLEX) 500 MG capsule   Oral   Take 1 capsule (500 mg total) by mouth 4 (four) times daily. For 2 weeks   56 capsule   0   . HYDROcodone-acetaminophen (NORCO/VICODIN) 5-325 MG per tablet   Oral   Take 1 tablet by mouth every 6 (six) hours as needed.   30 tablet   0   . levothyroxine (SYNTHROID, LEVOTHROID) 88 MCG tablet      TAKE 1 TABLET BY MOUTH EVERY DAY  30 tablet   5   . sodium bicarbonate 650 MG tablet   Oral   Take 2 tablets (1,300 mg total) by mouth 2 (two) times daily.   60 tablet   1    There were no vitals taken for this visit. Physical Exam  Constitutional: He appears well-developed and well-nourished. No distress.  HENT:  Head: Normocephalic and atraumatic. Head is without raccoon's eyes and without Battle's sign.  Right Ear: No hemotympanum.  Left Ear: No hemotympanum.  Mouth/Throat: Oropharynx is clear and moist.  Eyes: Conjunctivae are normal. Pupils are equal, round, and reactive to light.  Neck: Normal range of motion. Neck supple.  Cardiovascular: Normal rate, regular rhythm and intact distal pulses.   Pulmonary/Chest: Effort normal and breath sounds normal. He has no wheezes. He has no rales.   Abdominal: Soft. Bowel sounds are normal. There is no tenderness. There is no rebound and no guarding.  Musculoskeletal: He exhibits no edema.  Neurological: He is alert. He has normal reflexes.  Skin: Skin is warm and dry.  Psychiatric: He has a normal mood and affect.    ED Course  Procedures (including critical care time) Labs Review Labs Reviewed - No data to display Imaging Review No results found.  MDM  No diagnosis found. Results for orders placed in visit on 01/15/13  CBC WITH DIFFERENTIAL      Result Value Range   WBC 6.4  4.0 - 10.5 K/uL   RBC 3.07 (*) 4.22 - 5.81 MIL/uL   Hemoglobin 9.2 (*) 13.0 - 17.0 g/dL   HCT 27.7 (*) 39.0 - 52.0 %   MCV 90.2  78.0 - 100.0 fL   MCH 30.0  26.0 - 34.0 pg   MCHC 33.2  30.0 - 36.0 g/dL   RDW 15.4  11.5 - 15.5 %   Platelets 245  150 - 400 K/uL   Neutrophils Relative % 69  43 - 77 %   Neutro Abs 4.4  1.7 - 7.7 K/uL   Lymphocytes Relative 23  12 - 46 %   Lymphs Abs 1.5  0.7 - 4.0 K/uL   Monocytes Relative 6  3 - 12 %   Monocytes Absolute 0.4  0.1 - 1.0 K/uL   Eosinophils Relative 2  0 - 5 %   Eosinophils Absolute 0.2  0.0 - 0.7 K/uL   Basophils Relative 0  0 - 1 %   Basophils Absolute 0.0  0.0 - 0.1 K/uL   Smear Review Criteria for review not met    COMPREHENSIVE METABOLIC PANEL      Result Value Range   Sodium 140  135 - 145 mEq/L   Potassium 4.9  3.5 - 5.3 mEq/L   Chloride 105  96 - 112 mEq/L   CO2 18 (*) 19 - 32 mEq/L   Glucose, Bld 107 (*) 70 - 99 mg/dL   BUN 40 (*) 6 - 23 mg/dL   Creat 1.69 (*) 0.50 - 1.35 mg/dL   Total Bilirubin 0.3  0.3 - 1.2 mg/dL   Alkaline Phosphatase 86  39 - 117 U/L   AST 19  0 - 37 U/L   ALT 19  0 - 53 U/L   Total Protein 6.6  6.0 - 8.3 g/dL   Albumin 3.5  3.5 - 5.2 g/dL   Calcium 6.7 (*) 8.4 - 10.5 mg/dL  VITAMIN D 25 HYDROXY      Result Value Range   Vit D, 25-Hydroxy 14 (*) 30 - 89 ng/mL  Dg Chest 1 View  08/07/2013   CLINICAL DATA:  Trauma with left chest pain.  EXAM: CHEST - 1  VIEW  COMPARISON:  05/30/2010.  FINDINGS: Normal heart size. Aortic atherosclerosis. No infiltrate or edema. No effusion. No pneumothorax (edge over the lower right chest is likely a skin fold.  IMPRESSION: No active disease.   Electronically Signed   By: Jorje Guild   On: 08/07/2013 04:19   Dg Pelvis 1-2 Views  08/07/2013   *RADIOLOGY REPORT*  Clinical Data: Left hip pain, fall  PELVIS - 1-2 VIEW  Comparison: None available  Findings: There is a subtle acute nondisplaced fracture through the left femoral neck.  The left femoral head remains normally located within the acetabulum.  Femoral head height is preserved.  There is no pubic diastasis.  Limited views of the right hip grossly normal. SI joints are approximated.  IMPRESSION: Acute nondisplaced fracture of the left femoral neck.   Original Report Authenticated By: Jeannine Boga, M.D.   Dg Femur Left  08/07/2013   *RADIOLOGY REPORT*  Clinical Data: Fall, left hip pain  LEFT FEMUR - 2 VIEW  Comparison: None.  Findings: There is an acute nondisplaced fracture through the left femoral neck.  Femoral head remains in normal alignment with the acetabulum.  The femur is otherwise intact.  Degenerative osteoarthrosis seen about the left knee.  Vascular calcifications are noted within the left side.  IMPRESSION: Acute nondisplaced fracture of the left femoral neck.   Original Report Authenticated By: Jeannine Boga, M.D.   Ct Head Wo Contrast  08/07/2013   *RADIOLOGY REPORT*  Clinical Data: Fall, dizziness  CT HEAD WITHOUT CONTRAST  Technique:  Contiguous axial images were obtained from the base of the skull through the vertex without contrast.  Comparison: Prior CT from 05/27/2010 no  Findings: Moderate diffuse age related atrophy is present. Scattered and confluent hypodensity within the periventricular white matter is consistent with chronic small vessel ischemic changes.  No acute intracranial hemorrhage or infarct.  No mass or midline shift.   No extra-axial fluid collection.  Calvarium is intact.  Scalp soft tissues are normal.  Orbital soft tissues are within normal limits.  Paranasal sinuses and mastoid air cells are clear.  IMPRESSION: Moderate age related atrophy with chronic microvascular ischemic disease.  No acute intracranial abscess identified.   Original Report Authenticated By: Jeannine Boga, M.D.   Medications  ceFAZolin (ANCEF) IVPB 1 g/50 mL premix (1 g Intravenous New Bag/Given 08/07/13 0506)  fentaNYL (SUBLIMAZE) injection 50 mcg (50 mcg Intravenous Given 08/07/13 0306)  fentaNYL (SUBLIMAZE) injection 50 mcg (50 mcg Intravenous Given 08/07/13 0307)  morphine 4 MG/ML injection 4 mg (4 mg Intravenous Given 08/07/13 0506)  0.9 %  sodium chloride infusion ( Intravenous New Bag/Given 08/07/13 0509)    MDM Reviewed: previous chart, nursing note and vitals Reviewed previous: ECG and labs Interpretation: labs, ECG and x-ray (broken left neck fracture; renal insuffiency Cr at last d/c 1.69 today 1.8 Hemoglobin stable thrombocytopenia, platelets down from 245K to 93K ) Total time providing critical care: 30-74 minutes. This excludes time spent performing separately reportable procedures and services. Consults: orthopedics and admitting MD  CRITICAL CARE Performed by: Carlisle Beers Total critical care time: 31 minutes Critical care time was exclusive of separately billable procedures and treating other patients. Critical care was necessary to treat or prevent imminent or life-threatening deterioration. Critical care was time spent personally by me on the following activities: development of treatment plan with patient and/or surrogate  as well as nursing, discussions with consultants, evaluation of patient's response to treatment, examination of patient, obtaining history from patient or surrogate, ordering and performing treatments and interventions, ordering and review of laboratory studies, ordering and review of  radiographic studies, pulse oximetry and re-evaluation of patient's condition.     Date: 08/07/2013  Rate: 74  Rhythm: normal sinus rhythm  QRS Axis: left  Intervals: PR prolonged  ST/T Wave abnormalities: normal  Conduction Disutrbances:first-degree A-V block   Narrative Interpretation: PACs, QT is shorter  Old EKG Reviewed: changes noted     Case d/w Dr. Theda Sers on call for Dr. Doran Durand, Dr. Doran Durand does not repair Hips, please call Dr. Marcelino Scot.    Case d/w Dr. Marcelino Scot, information given via phone Patient NPO pre op antibiotics administered  Case d/w [facility], Dr. Hal Hope team 10 medical floor     Calandria Mullings Alfonso Patten, MD 08/07/13 [phone]

## 2013-08-07 NOTE — Consult Note (Signed)
[name], [name] NO.:  [id number]  MEDICAL RECORD NO.:  [id number]  LOCATION:  5N31C                        FACILITY:  [facility]  PHYSICIAN:  Jari Pigg, PA       DATE OF BIRTH:  1931/10/30  DATE OF CONSULTATION:  08/07/2013 DATE OF DISCHARGE:                                CONSULTATION   REFERRING PHYSICIAN:  April Palumbo-Rasch, MD, EDP.  PRIMARY CARE PHYSICIAN:  Jill Alexanders, M.D.  REASON FOR CONSULTATION:  Fall with left femoral neck fracture.  CHIEF COMPLAINT:  Left hip pain status post fall.  HISTORY OF PRESENT ILLNESS:  Mr. Shor is a very pleasant 79 year old male with medical history notable for chronic renal disease, vascular disease including aortic abdominal aneurysm repair in 2011, as well as borderline diabetes and hypothyroidism, who sustained a fall earlier this morning, resulting in a fracture to his left hip.  The patient states that he and his son had just returned home from their trip to [place], when he is trying to unload his suitcase and he fell in his house. He fell directly on his left hip.  He had immediate onset of pain and inability to bear weight.  He was brought to the [place] at [facility] where x-rays were performed and he was found to have a left femoral neck fracture.  The patient was transferred to [facility] and admitted to the Medicine Service given his medical issues.  Orthopedics was consulted regarding his left hip fracture.  The patient has also recently operated on in February 2014, for cellulitis of his right foot, which resulted in an amputation of his right second toe.  This has resolved nicely and he has had no subsequent sequelae to this as well.  The patient is currently in room 5, [place].  He is in a mild distress, complaining of spasms and cramps in his left leg.  He reports chronic bilateral knee pain, but this has not changed in character from baseline.  He denies any new-onset numbness or tingling  in his lower extremities or upper extremities.  He does have baseline peripheral neuropathy, but again no changes since this incident.  The patient denies any headaches, shortness of breath, chest pain, abdominal pain. Pain is located about his left hip, it is deep and aching in nature.  He does get spasms which exacerbates his pain.  Lying still in bed alleviates his pain and movement exacerbates.  The patient does live in a house with his son.  He does not use any assistive devices for ambulation.  Again, his ambulation is limited due to his knee pain.  The patient does try to remain as active as possible.  PAST MEDICAL HISTORY:  Notable for hypothyroidism, BPH, chronic renal disease, borderline diabetes, hypertension.  PAST SURGICAL HISTORY:  Notable for repair of AAA in 2011, prostate surgery, and amputation of right second toe in 2014.  FAMILY HISTORY:  Notable for heart attack in mother and father.  SOCIAL HISTORY:  The patient is a former smoker, he quit In November 2013, he had approximately 18-pack-year history.  Again, the patient lives with his son and does not use  any assistive devices.  ALLERGIES:  ASPIRIN.  The patient reports that this allergy, he had some hypotension, but this is about 30 years ago.  He is unsure if this is truly related to the aspirin.  MEDICATIONS:  Prior to admission include Synthroid daily and then Imodium as needed for diarrhea or loose stools.  REVIEW OF SYSTEMS:  As noted above in the HPI.  PHYSICAL EXAMINATION:  VITAL SIGNS:  Temperature 98.1, heart rate 103, respirations 20, 98% on room air, BP is 157/119. GENERAL:  The patient is awake and alert.  He is very pleasant, engaged male, appears appropriate for stated age. HEENT:  Head is atraumatic.  Extraocular muscles are intact.  Mucous membranes are somewhat dry. NECK:  No spinous process tenderness.  Full range of motion is noted. LUNGS:  Clear anterior fields are noted. CARDIAC:   Slightly tachycardic, S1 and S2 are noted with regular rhythm. ABDOMEN:  Soft.  Positive bowel sounds.  Old surgical scar noted. EXTREMITIES:  Bilateral upper extremities are without any acute findings.  Motor and sensory function is grossly intact.  Palpable peripheral pulses are appreciated.  Full range of motion.  Right lower extremity, again has second toe amputation as noted.  Peripheral vascular changes noted to the lower leg as well.  No acute findings at the hip, knee, ankle, or foot.  Motor and sensory functions grossly intact.  Again, he has baseline decreased sensory functions due to peripheral neuropathy.  Left lower extremity, the patient with significant pain to the left hip.  I did not range the hip due to the fracture of the femoral neck.  No significant swelling of the left lower extremity, again peripheral vascular changes are noted.  Knee is not swollen.  No acute findings at the tibia, ankle, or foot distally.  DPN, SPN, TN sensory functions grossly intact.  EHL FHL, anterior tibialis, posterior tibialis, peroneals, gastrocsoleus complex, motor function grossly intact as well.  Again, baseline peripheral neuropathy noted. No open wounds or lesions noted.  No significant swelling to the left hip.  Knee range of motion not performed.  Ankle range of motion is somewhat limited due to referred pain to the hip.  Palpable dorsalis pedis pulses appreciated as well.  LABORATORY DATA:  Pertinent Labs: Hemoglobin 9.3, hematocrit 28.1, platelets 93, white blood cells 6.0. Sodium 140, potassium 4.2, chloride 109, bicarb 16, BUN 37, creatinine 1.80.  IMAGING STUDIES:  X-rays, compartments are soft and nontender. No pain with passive stretch.  AP pelvis and anterior left hip demonstrate nondisplaced left femoral neck fracture.  Chest x-ray does not demonstrate any acute cardiopulmonary disease.  EKG from earlier this morning, August 07, 2013, demonstrates sinus rhythm with  some questionable PACs.  This does appear to be similar to his old EKG from February 2014 and in January 2014.  ASSESSMENT AND PLAN:  An 79 year old male status post fall.  1. Fall. 2. Left femoral neck fracture, Pauwels' 3, plan for OR tomorrow.     a.     Cannulated screw fixation.     b.     Bedrest for now.     c.     The patient will be partial weightbearing on left leg      postop.     d.     PT and OT consults postop.     e.     Ice as needed.     f.     Optimize pain control.     g.  The patient will have no range of motion restrictions. 3. Hypothyroid.  Continue home meds. 4. Borderline diabetes.  Continue per Medicine. 5. Chronic kidney disease, stage III, per Medicine. 6. Fluids, Electrolytes, and Nutrition:  Renal diet for now.  N.p.o.     after midnight. 7. Deep vein thrombosis, pulmonary embolism prophylaxis.  Lovenox     postop.  We will have to monitor platelets.  SCDs for now. 8. Pain.  We will titrate accordingly. 9. Miscellaneous.  The patient does note some mild baseline chronic     bilateral knee pain which limited ability and mobilize.  We will     check x-rays of the left knee due to hip fracture anyway and also     assess the degree of degenerative joint disease.  Also, check right     knee film due to chronic pain.  I did discuss with him the     possibility of steroid injection in the knee to help facilitate his     ability to participate with therapies.  We can do this in the OR     tomorrow should he like.  He has had steroid injections in the     past, but none recently.  It will be vital for him to mobilize and     participate with therapy to minimize his perioperative     complications. 10.Disposition.  OR tomorrow.  The patient refused to go to SNF     postop, but may consider inpatient rehab.     Jari Pigg, PA  I have seen and examined the patient. I agree with the findings above.  I discussed with the patient the risks and benefits  of surgery for his left femoral neck fracture, including the possibility of cannulated screw fixation versus arthroplasty.  He understands the risks to include infection, nerve injury, vessel injury, wound breakdown, arthritis, symptomatic hardware, DVT/ PE, AVN, failure of fixation, loss of motion, and need for further surgery among others.  He wishes to proceed with repair.   Rozanna Box, MD 08/08/2013 08:00AM     KWP/MEDQ  D:  08/07/2013  T:  08/07/2013  Job:  [id number]

## 2013-08-07 NOTE — H&P (Signed)
[facility] History and Physical  [name]. [id number] DOB: Oct 20, 1931 DOA: 08/07/2013  Referring physician: Dr Randal Buba PCP: Wyatt Haste, MD  Specialists: Dr. Marcelino Scot  Chief Complaint: left hip pain  HPI: [name]. is a 79 y.o. male  79 year old male with past medical history of chronic renal disease stage III, status post abdominal aneurysm repair in 2011 that comes in for a fall and left hip pain on the day of admission. He relates he was walking to the bathroom when he tripped on his feet and felt on his left hip, since then he's been having pain and spasms with movement of that leg. He called EMS brought him to the ED. He denies any chest pain, abdominal pain, shortness of breath, he did not hit his head, he relates no loss of consciousness.  In the ED: Basic metabolic panel was done that shows creatinine at baseline with a mild anion gap and bicarbonate 16, no leukocytosis and mild anemia at baseline, x-rays showed nondisplaced left femoral neck fracture.  Review of Systems: The patient denies anorexia, fever, weight loss,, vision loss, decreased hearing, hoarseness, chest pain, syncope, dyspnea on exertion, peripheral edema, balance deficits, hemoptysis, abdominal pain, melena, hematochezia, severe indigestion/heartburn, hematuria, incontinence, genital sores, muscle weakness, suspicious skin lesions, transient blindness, difficulty walking, depression, unusual weight change, abnormal bleeding, enlarged lymph nodes, angioedema, and breast masses.    Past Medical History  Diagnosis Date  . Hypothyroid   . BPH (benign prostatic hyperplasia)   . Renal insufficiency   . Glucose intolerance (impaired glucose tolerance)   . Complication of anesthesia     "a tough time"  -pt unable to explain  . Hypertension   . Pancreatitis    Past Surgical History  Procedure Laterality Date  . Abdominal aortic aneurysm repair    . Prostate surgery    . Amputation  Right 01/03/2013    Procedure: AMPUTATION DIGIT;  Surgeon: Wylene Simmer, MD;  Location: [facility]S;  Service: Orthopedics;  Laterality: Right;  RIGHT 2ND TOE AMPUTATION   Social History:  reports that he quit smoking about 10 months ago. He has never used smokeless tobacco. He reports that  drinks alcohol. He reports that he does not use illicit drugs. Lives at home  Allergies  Allergen Reactions  . Aspirin     Family History  Problem Relation Age of Onset  . Heart attack Mother   . Heart attack Father     Prior to Admission medications   Medication Sig Start Date End Date Taking? Authorizing Provider  levothyroxine (SYNTHROID, LEVOTHROID) 88 MCG tablet Take 88 mcg by mouth every morning.   Yes Historical Provider, MD  Multiple Vitamins-Minerals (MULTIVITAMIN WITH MINERALS) tablet Take 1 tablet by mouth daily.   Yes Historical Provider, MD  Vitamin D, Ergocalciferol, (DRISDOL) 50000 UNITS CAPS Take 50,000 Units by mouth every Monday. 12/10/12  Yes Denita Lung, MD  calcitRIOL (ROCALTROL) 0.25 MCG capsule Take 1 capsule (0.25 mcg total) by mouth daily. 01/07/13   Domenic Polite, MD  calcium carbonate (TUMS - DOSED IN MG ELEMENTAL CALCIUM) 500 MG chewable tablet Chew 1 tablet (200 mg of elemental calcium total) by mouth 3 (three) times daily with meals. 01/07/13   Domenic Polite, MD  cephALEXin (KEFLEX) 500 MG capsule Take 1 capsule (500 mg total) by mouth 4 (four) times daily. For 2 weeks 01/08/13   Domenic Polite, MD  HYDROcodone-acetaminophen (NORCO/VICODIN) 5-325 MG per tablet Take 1 tablet by mouth every 6 (six)  hours as needed. 01/08/13   Domenic Polite, MD  levothyroxine (SYNTHROID, LEVOTHROID) 88 MCG tablet TAKE 1 TABLET BY MOUTH EVERY DAY 04/02/13   Denita Lung, MD  sodium bicarbonate 650 MG tablet Take 2 tablets (1,300 mg total) by mouth 2 (two) times daily. 01/07/13   Domenic Polite, MD   Physical Exam: Filed Vitals:   08/07/13 0655  BP: 157/119  Pulse: 103  Temp: 98.1 F (36.7 C)   Resp: 20    BP 157/119  Pulse 103  Temp(Src) 98.1 F (36.7 C) (Oral)  Resp 20  Ht 5\' 10"  (1.778 m)  Wt 74.844 kg (165 lb)  BMI 23.68 kg/m2  SpO2 98%  General Appearance:    Alert, cooperative, no distress, appears stated age  Head:    Normocephalic, without obvious abnormality, atraumatic           Throat:   Lips, mucosa, and tongue normal  Neck:   Supple, symmetrical, trachea midline, no adenopathy;       thyroid:       Lungs:     Clear to auscultation bilaterally, respirations unlabored, barrel chest      Heart:    Regular rate and rhythm, S1 and S2 normal, no murmur, rub   or gallop  Abdomen:     Soft, non-tender, bowel sounds active all four quadrants,    no masses, no organomegaly        Extremities:   amputation with well-healed stump of the second toe on the left, left leg  externally rotated and shorter than the right   Pulses:   2+ and symmetric all extremities  Skin:   multiple bruises on his arms   Lymph nodes:   Cervical, supraclavicular, and axillary nodes normal  Neurologic:   CNII-XII intact. Normal strength, sensation and reflexes      throughout    Labs on Admission:  Basic Metabolic Panel:  Recent Labs Lab 08/07/13 0429  NA 140  K 4.2  CL 109  CO2 16*  GLUCOSE 122*  BUN 37*  CREATININE 1.80*  CALCIUM 7.9*   Liver Function Tests: No results found for this basename: AST, ALT, ALKPHOS, BILITOT, PROT, ALBUMIN,  in the last 168 hours No results found for this basename: LIPASE, AMYLASE,  in the last 168 hours No results found for this basename: AMMONIA,  in the last 168 hours CBC:  Recent Labs Lab 08/07/13 0429  WBC 6.0  NEUTROABS 4.4  HGB 9.3*  HCT 28.1*  MCV 96.2  PLT 93*   Cardiac Enzymes: No results found for this basename: CKTOTAL, CKMB, CKMBINDEX, TROPONINI,  in the last 168 hours  BNP (last 3 results) No results found for this basename: PROBNP,  in the last 8760 hours CBG: No results found for this basename: GLUCAP,  in the  last 168 hours  Radiological Exams on Admission: Dg Chest 1 View  08/07/2013   CLINICAL DATA:  Trauma with left chest pain.  EXAM: CHEST - 1 VIEW  COMPARISON:  05/30/2010.  FINDINGS: Normal heart size. Aortic atherosclerosis. No infiltrate or edema. No effusion. No pneumothorax (edge over the lower right chest is likely a skin fold.  IMPRESSION: No active disease.   Electronically Signed   By: Jorje Guild   On: 08/07/2013 04:19   Dg Pelvis 1-2 Views  08/07/2013   *RADIOLOGY REPORT*  Clinical Data: Left hip pain, fall  PELVIS - 1-2 VIEW  Comparison: None available  Findings: There is a subtle acute nondisplaced fracture  through the left femoral neck.  The left femoral head remains normally located within the acetabulum.  Femoral head height is preserved.  There is no pubic diastasis.  Limited views of the right hip grossly normal. SI joints are approximated.  IMPRESSION: Acute nondisplaced fracture of the left femoral neck.   Original Report Authenticated By: Jeannine Boga, M.D.   Dg Femur Left  08/07/2013   *RADIOLOGY REPORT*  Clinical Data: Fall, left hip pain  LEFT FEMUR - 2 VIEW  Comparison: None.  Findings: There is an acute nondisplaced fracture through the left femoral neck.  Femoral head remains in normal alignment with the acetabulum.  The femur is otherwise intact.  Degenerative osteoarthrosis seen about the left knee.  Vascular calcifications are noted within the left side.  IMPRESSION: Acute nondisplaced fracture of the left femoral neck.   Original Report Authenticated By: Jeannine Boga, M.D.   Ct Head Wo Contrast  08/07/2013   *RADIOLOGY REPORT*  Clinical Data: Fall, dizziness  CT HEAD WITHOUT CONTRAST  Technique:  Contiguous axial images were obtained from the base of the skull through the vertex without contrast.  Comparison: Prior CT from 05/27/2010 no  Findings: Moderate diffuse age related atrophy is present. Scattered and confluent hypodensity within the  periventricular white matter is consistent with chronic small vessel ischemic changes.  No acute intracranial hemorrhage or infarct.  No mass or midline shift.  No extra-axial fluid collection.  Calvarium is intact.  Scalp soft tissues are normal.  Orbital soft tissues are within normal limits.  Paranasal sinuses and mastoid air cells are clear.  IMPRESSION: Moderate age related atrophy with chronic microvascular ischemic disease.  No acute intracranial abscess identified.   Original Report Authenticated By: Jeannine Boga, M.D.    EKG: Independently reviewed. Sinus rhythm left axis deviation with PVCs  Assessment/Plan Hip fracture, left/preop evaluation: - We'll place the patient n.p.o., orthopedic surgery has been consulted. - He can walk about 2 miles without getting short of breath, he does relate his lower extremities cramp up with that much of an ambulation, his metabolic equivalent is greater than 4. He used to smoke for over 30 years quit about 6 months ago. He is low to moderate risk for any cardiopulmonary complications, I have explained the risk and benefits to the patient and he will like to proceed with surgery. - Place him n.p.o.  Hypothyroid: - Continue Synthroid.  Borderline diabetes mellitus: - On no Medications at home check hemoglobin A1c we'll start him on sliding scale insulin once he is able to tolerate orals.  CKD (chronic kidney disease), stage III: - At baseline continue bicarbonate tablets.  Metabolic acidosis, increased anion gap: - At baseline.   Anemia of chronic disease - At baseline.  Orthopedic surgery has already been consulted by the emergency room physician.  Code Status: full Family Communication: none Disposition Plan: inpatient 3-4 days  Time spent: 75 minutes  Charlynne Cousins [facility] Pager [phone]  If 7PM-7AM, please contact night-coverage [email or web address] Password [facility] 08/07/2013, 7:39 AM

## 2013-08-07 NOTE — Progress Notes (Signed)
Orthopaedic Trauma Service Consult  Pt seen and evaluated Full consult dictated: [id number]  Assessment and Plan  1. Fall 2. Left femoral neck fractures, Pauwels 3   Plan for OR tomorrow   Cannulated screw fixation  Bed rest for now  Will be PWB on L leg post op  PT/OT consults post op  Ice prn   Optimize pain control  3. Hypothyroid  Home meds  4. Borderline DM  Per medicine  5. CKD, stage III  Per medicine  6. FEN  Renal diet for now  NPO after MN   7. DVT/PE prophylaxis  Lovenox post op, monitor plts  SCD's for now  8. Pain   Adjust accordingly  9. Misc  Pt notes baseline chronic B knee pain which limits is ability to mobilize  Will check xray of L knee, due to hip fx and to assess degree of DJD, also check R knee due to chronic pain   Discussed with him the possibility of knee steroid injection to help facilitate his ability to participate with therapies.  Can do tomorrow in OR if he would like. He has had steroid injections in the past, none recently   It will be very important for him to be as mobile as possible post op to minimize perioperative complications  10. Dispo  OR tomorrow  Pt refuses to go to SNF post op  Consider CIR  Jari Pigg, PA-C [facility] [phone] (P) 08/07/2013 9:12 AM

## 2013-08-08 ENCOUNTER — Inpatient Hospital Stay (HOSPITAL_COMMUNITY): Payer: Medicare Other

## 2013-08-08 ENCOUNTER — Encounter (HOSPITAL_COMMUNITY): Payer: Self-pay | Admitting: Critical Care Medicine

## 2013-08-08 ENCOUNTER — Inpatient Hospital Stay (HOSPITAL_COMMUNITY): Payer: Medicare Other | Admitting: Anesthesiology

## 2013-08-08 ENCOUNTER — Encounter (HOSPITAL_COMMUNITY)
Admission: EM | Disposition: A | Discharge status: Skilled Nursing Facility | Payer: Self-pay | Source: Home / Self Care | Attending: Internal Medicine

## 2013-08-08 ENCOUNTER — Encounter (HOSPITAL_COMMUNITY): Payer: Self-pay | Admitting: Anesthesiology

## 2013-08-08 DIAGNOSIS — E039 Hypothyroidism, unspecified: Secondary | ICD-10-CM

## 2013-08-08 HISTORY — PX: STERIOD INJECTION: SHX5046

## 2013-08-08 HISTORY — PX: HIP PINNING,CANNULATED: SHX1758

## 2013-08-08 LAB — COMPREHENSIVE METABOLIC PANEL
ALT: 12 U/L (ref 0–53)
AST: 11 U/L (ref 0–37)
CO2: 13 mEq/L — ABNORMAL LOW (ref 19–32)
Chloride: 107 mEq/L (ref 96–112)
Creatinine, Ser: 1.92 mg/dL — ABNORMAL HIGH (ref 0.50–1.35)
GFR calc non Af Amer: 31 mL/min — ABNORMAL LOW (ref >90)
Sodium: 134 mEq/L — ABNORMAL LOW (ref 135–145)
Total Bilirubin: 0.3 mg/dL (ref 0.3–1.2)

## 2013-08-08 LAB — GLUCOSE, CAPILLARY

## 2013-08-08 LAB — CBC
MCH: 32.2 pg (ref 26.0–34.0)
MCHC: 34.6 g/dL (ref 30.0–36.0)
Platelets: 94 10*3/uL — ABNORMAL LOW (ref 150–400)
RDW: 15.5 % (ref 11.5–15.5)

## 2013-08-08 SURGERY — FIXATION, FEMUR, NECK, PERCUTANEOUS, USING SCREW
Anesthesia: General | Site: Knee | Laterality: Left | Wound class: Clean

## 2013-08-08 MED ORDER — ENOXAPARIN SODIUM 40 MG/0.4ML ~~LOC~~ SOLN
40.0000 mg | SUBCUTANEOUS | Status: DC
  Administered 2013-08-09 – 2013-08-11 (×3): 40 mg via SUBCUTANEOUS
  Filled 2013-08-08 (×5): qty 0.4

## 2013-08-08 MED ORDER — LIDOCAINE HCL (CARDIAC) 20 MG/ML IV SOLN
INTRAVENOUS | Status: DC | PRN
  Administered 2013-08-08: 100 mg via INTRAVENOUS

## 2013-08-08 MED ORDER — EPHEDRINE SULFATE 50 MG/ML IJ SOLN
INTRAMUSCULAR | Status: DC | PRN
  Administered 2013-08-08 (×2): 10 mg via INTRAVENOUS

## 2013-08-08 MED ORDER — MIDAZOLAM HCL 5 MG/5ML IJ SOLN
INTRAMUSCULAR | Status: DC | PRN
  Administered 2013-08-08: 1 mg via INTRAVENOUS

## 2013-08-08 MED ORDER — ONDANSETRON HCL 4 MG/2ML IJ SOLN: 4.0000 mg | Freq: Once | INTRAMUSCULAR | Status: DC | PRN

## 2013-08-08 MED ORDER — TRIAMCINOLONE ACETONIDE 40 MG/ML IJ SUSP
INTRAMUSCULAR | Status: AC
  Filled 2013-08-08: qty 10

## 2013-08-08 MED ORDER — CEFAZOLIN SODIUM-DEXTROSE 2-3 GM-% IV SOLR
2.0000 g | Freq: Four times a day (QID) | INTRAVENOUS | Status: AC
  Administered 2013-08-08 (×2): 2 g via INTRAVENOUS
  Filled 2013-08-08 (×2): qty 50

## 2013-08-08 MED ORDER — FENTANYL CITRATE 0.05 MG/ML IJ SOLN
INTRAMUSCULAR | Status: DC | PRN
  Administered 2013-08-08 (×3): 50 ug via INTRAVENOUS
  Administered 2013-08-08: 100 ug via INTRAVENOUS

## 2013-08-08 MED ORDER — METOCLOPRAMIDE HCL 5 MG/ML IJ SOLN: 5.0000 mg | Freq: Three times a day (TID) | INTRAMUSCULAR | Status: DC | PRN

## 2013-08-08 MED ORDER — HYDROMORPHONE HCL PF 1 MG/ML IJ SOLN
INTRAMUSCULAR | Status: AC
  Filled 2013-08-08: qty 1

## 2013-08-08 MED ORDER — OXYCODONE HCL 5 MG/5ML PO SOLN: 5.0000 mg | Freq: Once | ORAL | Status: DC | PRN

## 2013-08-08 MED ORDER — NEOSTIGMINE METHYLSULFATE 1 MG/ML IJ SOLN
INTRAMUSCULAR | Status: DC | PRN
  Administered 2013-08-08: 4 mg via INTRAVENOUS

## 2013-08-08 MED ORDER — ONDANSETRON HCL 4 MG PO TABS: 4.0000 mg | ORAL_TABLET | Freq: Four times a day (QID) | ORAL | Status: DC | PRN

## 2013-08-08 MED ORDER — LACTATED RINGERS IV SOLN
INTRAVENOUS | Status: DC | PRN
  Administered 2013-08-08 (×2): via INTRAVENOUS

## 2013-08-08 MED ORDER — ONDANSETRON HCL 4 MG/2ML IJ SOLN: 4.0000 mg | Freq: Four times a day (QID) | INTRAMUSCULAR | Status: DC | PRN

## 2013-08-08 MED ORDER — GLYCOPYRROLATE 0.2 MG/ML IJ SOLN
INTRAMUSCULAR | Status: DC | PRN
  Administered 2013-08-08: 0.6 mg via INTRAVENOUS

## 2013-08-08 MED ORDER — METOCLOPRAMIDE HCL 5 MG PO TABS
5.0000 mg | ORAL_TABLET | Freq: Three times a day (TID) | ORAL | Status: DC | PRN
  Filled 2013-08-08: qty 2

## 2013-08-08 MED ORDER — POLYETHYLENE GLYCOL 3350 17 G PO PACK
17.0000 g | PACK | Freq: Every day | ORAL | Status: DC
  Administered 2013-08-09 – 2013-08-11 (×3): 17 g via ORAL
  Filled 2013-08-08 (×5): qty 1

## 2013-08-08 MED ORDER — MEPERIDINE HCL 25 MG/ML IJ SOLN: 6.2500 mg | INTRAMUSCULAR | Status: DC | PRN

## 2013-08-08 MED ORDER — BUPIVACAINE HCL (PF) 0.25 % IJ SOLN
INTRAMUSCULAR | Status: AC
  Filled 2013-08-08: qty 30

## 2013-08-08 MED ORDER — PHENOL 1.4 % MT LIQD: 1.0000 | OROMUCOSAL | Status: DC | PRN

## 2013-08-08 MED ORDER — 0.9 % SODIUM CHLORIDE (POUR BTL) OPTIME
TOPICAL | Status: DC | PRN
  Administered 2013-08-08: 1000 mL

## 2013-08-08 MED ORDER — HYDROMORPHONE HCL PF 1 MG/ML IJ SOLN
0.2500 mg | INTRAMUSCULAR | Status: DC | PRN
  Administered 2013-08-08 (×4): 0.5 mg via INTRAVENOUS

## 2013-08-08 MED ORDER — BUPIVACAINE HCL (PF) 0.25 % IJ SOLN
INTRAMUSCULAR | Status: DC | PRN
  Administered 2013-08-08: 30 mL

## 2013-08-08 MED ORDER — ONDANSETRON HCL 4 MG/2ML IJ SOLN
INTRAMUSCULAR | Status: DC | PRN
  Administered 2013-08-08: 4 mg via INTRAVENOUS

## 2013-08-08 MED ORDER — ROCURONIUM BROMIDE 100 MG/10ML IV SOLN
INTRAVENOUS | Status: DC | PRN
  Administered 2013-08-08: 50 mg via INTRAVENOUS
  Administered 2013-08-08: 10 mg via INTRAVENOUS

## 2013-08-08 MED ORDER — MENTHOL 3 MG MT LOZG: 1.0000 | LOZENGE | OROMUCOSAL | Status: DC | PRN

## 2013-08-08 MED ORDER — OXYCODONE HCL 5 MG PO TABS: 5.0000 mg | ORAL_TABLET | Freq: Once | ORAL | Status: DC | PRN

## 2013-08-08 MED ORDER — PROPOFOL 10 MG/ML IV BOLUS
INTRAVENOUS | Status: DC | PRN
  Administered 2013-08-08: 100 mg via INTRAVENOUS

## 2013-08-08 MED ORDER — ARTIFICIAL TEARS OP OINT
TOPICAL_OINTMENT | OPHTHALMIC | Status: DC | PRN
  Administered 2013-08-08: 1 via OPHTHALMIC

## 2013-08-08 MED ORDER — TRIAMCINOLONE ACETONIDE 40 MG/ML IJ SUSP
INTRAMUSCULAR | Status: DC | PRN
  Administered 2013-08-08: 5 mL

## 2013-08-08 MED ORDER — PHENYLEPHRINE HCL 10 MG/ML IJ SOLN
INTRAMUSCULAR | Status: DC | PRN
  Administered 2013-08-08 (×2): 80 ug via INTRAVENOUS

## 2013-08-08 SURGICAL SUPPLY — 47 items
BRUSH SCRUB DISP (MISCELLANEOUS) ×6 IMPLANT
CLOTH BEACON ORANGE TIMEOUT ST (SAFETY) IMPLANT
COVER SURGICAL LIGHT HANDLE (MISCELLANEOUS) ×6 IMPLANT
DRAPE C-ARM 42X72 X-RAY (DRAPES) ×3 IMPLANT
DRAPE C-ARMOR (DRAPES) ×3 IMPLANT
DRAPE ORTHO SPLIT 77X108 STRL (DRAPES) ×2
DRAPE STERI IOBAN 125X83 (DRAPES) IMPLANT
DRAPE SURG ORHT 6 SPLT 77X108 (DRAPES) ×4 IMPLANT
DRSG MEPILEX BORDER 4X4 (GAUZE/BANDAGES/DRESSINGS) IMPLANT
DRSG MEPILEX BORDER 4X8 (GAUZE/BANDAGES/DRESSINGS) ×3 IMPLANT
ELECT REM PT RETURN 9FT ADLT (ELECTROSURGICAL) ×3
ELECTRODE REM PT RTRN 9FT ADLT (ELECTROSURGICAL) ×2 IMPLANT
GLOVE BIO SURGEON STRL SZ7.5 (GLOVE) ×3 IMPLANT
GLOVE BIO SURGEON STRL SZ8 (GLOVE) ×3 IMPLANT
GLOVE BIOGEL PI IND STRL 7.0 (GLOVE) ×4 IMPLANT
GLOVE BIOGEL PI IND STRL 7.5 (GLOVE) ×2 IMPLANT
GLOVE BIOGEL PI IND STRL 8 (GLOVE) ×2 IMPLANT
GLOVE BIOGEL PI INDICATOR 7.0 (GLOVE) ×2
GLOVE BIOGEL PI INDICATOR 7.5 (GLOVE) ×1
GLOVE BIOGEL PI INDICATOR 8 (GLOVE) ×1
GLOVE ECLIPSE 6.5 STRL STRAW (GLOVE) ×3 IMPLANT
GOWN PREVENTION PLUS XLARGE (GOWN DISPOSABLE) ×3 IMPLANT
GOWN STRL NON-REIN LRG LVL3 (GOWN DISPOSABLE) ×3 IMPLANT
KIT BASIN OR (CUSTOM PROCEDURE TRAY) ×3 IMPLANT
KIT ROOM TURNOVER OR (KITS) ×3 IMPLANT
MANIFOLD NEPTUNE II (INSTRUMENTS) ×3 IMPLANT
NS IRRIG 1000ML POUR BTL (IV SOLUTION) ×3 IMPLANT
PACK GENERAL/GYN (CUSTOM PROCEDURE TRAY) ×3 IMPLANT
PAD ARMBOARD 7.5X6 YLW CONV (MISCELLANEOUS) ×6 IMPLANT
PIN GUIDE ADJ PARALLEL 2.8 OIC (PIN) ×15 IMPLANT
SCREW CANN 7.3X65 16MM THRD (Screw) IMPLANT
SCREW CANN 7.3X80 16MM THRD (Screw) ×3 IMPLANT
SCREW CANN 7.3X85 16MM THRD (Screw) ×3 IMPLANT
SCREW CANN 7.3X85 32MM THRD (Screw) ×3 IMPLANT
SCREW CANN 7.3X90 16MM THRD (Screw) IMPLANT
SCREW CANN 7.3X95 32MM THRD (Screw) ×3 IMPLANT
STAPLER VISISTAT 35W (STAPLE) ×3 IMPLANT
SUT VIC AB 0 CT1 27 (SUTURE)
SUT VIC AB 0 CT1 27XBRD ANBCTR (SUTURE) IMPLANT
SUT VIC AB 1 CT1 27 (SUTURE)
SUT VIC AB 1 CT1 27XBRD ANBCTR (SUTURE) IMPLANT
SUT VIC AB 2-0 CT1 27 (SUTURE) ×1
SUT VIC AB 2-0 CT1 TAPERPNT 27 (SUTURE) ×2 IMPLANT
TOWEL OR 17X24 6PK STRL BLUE (TOWEL DISPOSABLE) ×3 IMPLANT
TOWEL OR 17X26 10 PK STRL BLUE (TOWEL DISPOSABLE) ×6 IMPLANT
WASHER OIC 13MM 6 PACK (Screw) ×3 IMPLANT
WATER STERILE IRR 1000ML POUR (IV SOLUTION) ×3 IMPLANT

## 2013-08-08 NOTE — Anesthesia Preprocedure Evaluation (Addendum)
Anesthesia Evaluation  Patient identified by MRN, date of birth, ID band Patient awake    Reviewed: Allergy & Precautions, H&P , NPO status , Patient's Chart, lab work & pertinent test results  Airway Mallampati: II TM Distance: >3 FB Neck ROM: Full    Dental  (+) Edentulous Upper and Edentulous Lower   Pulmonary former smoker,          Cardiovascular hypertension, Pt. on medications     Neuro/Psych    GI/Hepatic   Endo/Other  diabetesHypothyroidism   Renal/GU Renal InsufficiencyRenal disease     Musculoskeletal   Abdominal   Peds  Hematology   Anesthesia Other Findings   Reproductive/Obstetrics                        Anesthesia Physical Anesthesia Plan  ASA: III  Anesthesia Plan: General   Post-op Pain Management:    Induction: Intravenous  Airway Management Planned: Oral ETT  Additional Equipment:   Intra-op Plan:   Post-operative Plan: Extubation in OR  Informed Consent: I have reviewed the patients History and Physical, chart, labs and discussed the procedure including the risks, benefits and alternatives for the proposed anesthesia with the patient or authorized representative who has indicated his/her understanding and acceptance.   Dental advisory given  Plan Discussed with: Surgeon and CRNA  Anesthesia Plan Comments:        Anesthesia Quick Evaluation

## 2013-08-08 NOTE — Transfer of Care (Signed)
Immediate Anesthesia Transfer of Care Note  Patient: [name].  Procedure(s) Performed: Procedure(s): LEFT CANNULATED HIP PINNING (Left) STEROID INJECTION (Bilateral)  Patient Location: PACU  Anesthesia Type:General  Level of Consciousness: awake and alert   Airway & Oxygen Therapy: Patient Spontanous Breathing and Patient connected to nasal cannula oxygen  Post-op Assessment: Report given to PACU RN, Post -op Vital signs reviewed and stable and Patient moving all extremities X 4  Post vital signs: Reviewed and stable  Complications: No apparent anesthesia complications

## 2013-08-08 NOTE — Progress Notes (Signed)
[facility] PROGRESS NOTE  [name]. BZ:[id number] DOB: [birth year] DOA: 08/07/2013 PCP: Wyatt Haste, MD  Assessment/Plan:  Hip fracture, left/preop evaluation:  - s/p surgery -PT/OT- patient would like to avoid SNF  Hypothyroid:  - Continue Synthroid.   Borderline diabetes mellitus:  - On no Medications at home check hemoglobin A1c we'll start him on sliding scale insulin once he is able to tolerate orals.   CKD (chronic kidney disease), stage III:  - At baseline continue bicarbonate tablets.   Metabolic acidosis, increased anion gap:  - At baseline.   Anemia of chronic disease  - At baseline.  Code Status: full Family Communication: patient Disposition Plan:   Consultants:  ortho  Procedures: LEFT CANNULATED HIP PINNING (Left)  STEROID INJECTION (Bilateral) knees   Antibiotics:    HPI/Subjective: Sleepy s/p surgery  Objective: Filed Vitals:   08/08/13 0545  BP: 128/58  Pulse: 80  Temp: 98.7 F (37.1 C)  Resp: 18    Intake/Output Summary (Last 24 hours) at 08/08/13 0932 Last data filed at 08/08/13 0650  Gross per 24 hour  Intake   1290 ml  Output   2000 ml  Net   -710 ml   Filed Weights   08/07/13 0302  Weight: 74.844 kg (165 lb)    Exam:   General:  sleepy  Cardiovascular: rrr  Respiratory: clear   Abdomen: +BS, soft  Musculoskeletal: no edema   Data Reviewed: Basic Metabolic Panel:  Recent Labs Lab 08/07/13 0429 08/07/13 1415 08/08/13 0533  NA 140  --  134*  K 4.2  --  4.1  CL 109  --  107  CO2 16*  --  13*  GLUCOSE 122*  --  104*  BUN 37*  --  38*  CREATININE 1.80* 1.81* 1.92*  CALCIUM 7.9*  --  7.4*   Liver Function Tests:  Recent Labs Lab 08/08/13 0533  AST 11  ALT 12  ALKPHOS 81  BILITOT 0.3  PROT 5.7*  ALBUMIN 2.9*   No results found for this basename: LIPASE, AMYLASE,  in the last 168 hours No results found for this basename: AMMONIA,  in the last 168 hours CBC:  Recent  Labs Lab 08/07/13 0429 08/07/13 1415 08/08/13 0533  WBC 6.0 5.3 4.9  NEUTROABS 4.4  --   --   HGB 9.3* 9.2* 9.3*  HCT 28.1* 26.7* 26.9*  MCV 96.2 92.7 93.1  PLT 93* 97* 94*   Cardiac Enzymes: No results found for this basename: CKTOTAL, CKMB, CKMBINDEX, TROPONINI,  in the last 168 hours BNP (last 3 results) No results found for this basename: PROBNP,  in the last 8760 hours CBG:  Recent Labs Lab 08/07/13 0804 08/07/13 1125 08/07/13 1618 08/07/13 2142 08/08/13 0622  GLUCAP 95 117* 122* 110* 112*    Recent Results (from the past 240 hour(s))  SURGICAL PCR SCREEN     Status: None   Collection Time    08/07/13  4:22 PM      Result Value Range Status   MRSA, PCR NEGATIVE  NEGATIVE Final   Staphylococcus aureus NEGATIVE  NEGATIVE Final   Comment:            The Xpert SA Assay (FDA     approved for NASAL specimens     in patients over 77 years of age),     is one component of     a comprehensive surveillance     program.  Test performance has  been validated by [facility] for patients greater     than or equal to 77 year old.     It is not intended     to diagnose infection nor to     guide or monitor treatment.     Studies: Dg Chest 1 View  08/07/2013   CLINICAL DATA:  Trauma with left chest pain.  EXAM: CHEST - 1 VIEW  COMPARISON:  05/30/2010.  FINDINGS: Normal heart size. Aortic atherosclerosis. No infiltrate or edema. No effusion. No pneumothorax (edge over the lower right chest is likely a skin fold.  IMPRESSION: No active disease.   Electronically Signed   By: Jorje Guild   On: 08/07/2013 04:19   Dg Pelvis 1-2 Views  08/07/2013   *RADIOLOGY REPORT*  Clinical Data: Left hip pain, fall  PELVIS - 1-2 VIEW  Comparison: None available  Findings: There is a subtle acute nondisplaced fracture through the left femoral neck.  The left femoral head remains normally located within the acetabulum.  Femoral head height is preserved.  There is no pubic diastasis.   Limited views of the right hip grossly normal. SI joints are approximated.  IMPRESSION: Acute nondisplaced fracture of the left femoral neck.   Original Report Authenticated By: Jeannine Boga, M.D.   Dg Femur Left  08/07/2013   *RADIOLOGY REPORT*  Clinical Data: Fall, left hip pain  LEFT FEMUR - 2 VIEW  Comparison: None.  Findings: There is an acute nondisplaced fracture through the left femoral neck.  Femoral head remains in normal alignment with the acetabulum.  The femur is otherwise intact.  Degenerative osteoarthrosis seen about the left knee.  Vascular calcifications are noted within the left side.  IMPRESSION: Acute nondisplaced fracture of the left femoral neck.   Original Report Authenticated By: Jeannine Boga, M.D.   Ct Head Wo Contrast  08/07/2013   *RADIOLOGY REPORT*  Clinical Data: Fall, dizziness  CT HEAD WITHOUT CONTRAST  Technique:  Contiguous axial images were obtained from the base of the skull through the vertex without contrast.  Comparison: Prior CT from 05/27/2010 no  Findings: Moderate diffuse age related atrophy is present. Scattered and confluent hypodensity within the periventricular white matter is consistent with chronic small vessel ischemic changes.  No acute intracranial hemorrhage or infarct.  No mass or midline shift.  No extra-axial fluid collection.  Calvarium is intact.  Scalp soft tissues are normal.  Orbital soft tissues are within normal limits.  Paranasal sinuses and mastoid air cells are clear.  IMPRESSION: Moderate age related atrophy with chronic microvascular ischemic disease.  No acute intracranial abscess identified.   Original Report Authenticated By: Jeannine Boga, M.D.   Dg Knee Left Port  08/07/2013   CLINICAL DATA:  Left femoral neck fracture.  EXAM: PORTABLE LEFT KNEE - 1-2 VIEW  COMPARISON:  None.  FINDINGS: Study is limited given the proximal femoral fracture. No acute bony abnormality. No fracture, subluxation or dislocation  visualized. No significant joint effusion.  IMPRESSION: Limited study. No acute findings.   Electronically Signed   By: Rolm Baptise M.D.   On: 08/07/2013 10:49   Dg Knee Right Port  08/07/2013   CLINICAL DATA:  Pain and in both legs.  EXAM: PORTABLE RIGHT KNEE - 1-2 VIEW  COMPARISON:  Knee film 08/05/2008  FINDINGS: There has been interval progression of joint space narrowing of the medial and lateral compartment of the right knee. No evidence of acute fracture. No joint effusion.  IMPRESSION:  Advanced loss of joint space in the medial and lateral compartment is likely related to osteoarthritis. No acute findings.   Electronically Signed   By: Suzy Bouchard M.D.   On: 08/07/2013 10:21    Scheduled Meds: . [facility] HOLD] calcitRIOL  0.25 mcg Oral Daily  . [facility] HOLD] calcium carbonate  1 tablet Oral TID WC  . [[facility] HOLD] heparin  5,000 Units Subcutaneous Q8H  . [[facility] HOLD] insulin aspart  0-9 Units Subcutaneous TID WC  . [[facility] HOLD] insulin aspart  3 Units Subcutaneous TID WC  . [facility] HOLD] levothyroxine  88 mcg Oral QAC breakfast  . [[facility] HOLD] sodium bicarbonate  1,300 mg Oral BID  . [facility] HOLD] sodium chloride  3 mL Intravenous Q12H   Continuous Infusions: . sodium chloride 75 mL/hr at 08/08/13 [id number]    Principal Problem:   Hip fracture, left Active Problems:   Hypothyroid   Borderline diabetes mellitus   CKD (chronic kidney disease), stage III   Metabolic acidosis, increased anion gap   Anemia of chronic disease    Time spent: [place] Pager [phone]. If 7PM-7AM, please contact night-coverage at [email or web address], password [facility] 08/08/2013, 9:32 AM  LOS: 1 day

## 2013-08-08 NOTE — Anesthesia Procedure Notes (Signed)
Procedure Name: Intubation Date/Time: 08/08/2013 8:21 AM Performed by: Carola Frost Pre-anesthesia Checklist: Patient identified, Timeout performed, Emergency Drugs available, Suction available and Patient being monitored Patient Re-evaluated:Patient Re-evaluated prior to inductionOxygen Delivery Method: Circle system utilized Preoxygenation: Pre-oxygenation with 100% oxygen Intubation Type: IV induction Ventilation: Mask ventilation without difficulty Laryngoscope Size: Mac and 4 Grade View: Grade I Tube type: Oral Tube size: 7.5 mm Number of attempts: 1 Airway Equipment and Method: Stylet Placement Confirmation: positive ETCO2,  ETT inserted through vocal cords under direct vision and breath sounds checked- equal and bilateral Secured at: 23 cm Tube secured with: Tape Dental Injury: Teeth and Oropharynx as per pre-operative assessment

## 2013-08-08 NOTE — Progress Notes (Signed)
PT NOTE:  PT deferred.  Pt to surgery this date.  Will follow in am.

## 2013-08-08 NOTE — Anesthesia Postprocedure Evaluation (Signed)
Anesthesia Post Note  Patient: [name].  Procedure(s) Performed: Procedure(s) (LRB): LEFT CANNULATED HIP PINNING (Left) STEROID INJECTION (Bilateral)  Anesthesia type: general  Patient location: PACU  Post pain: Pain level controlled  Post assessment: Patient's Cardiovascular Status Stable  Last Vitals:  Filed Vitals:   08/08/13 1100  BP: 162/68  Pulse:   Temp:   Resp:     Post vital signs: Reviewed and stable  Level of consciousness: sedated  Complications: No apparent anesthesia complications

## 2013-08-08 NOTE — Progress Notes (Signed)
I have seen and examined the patient. I agree with the findings above. Please see other full note.  Rozanna Box, MD 08/08/2013

## 2013-08-08 NOTE — Preoperative (Signed)
Beta Blockers   Reason not to administer Beta Blockers:Not Applicable 

## 2013-08-08 NOTE — Consult Note (Signed)
Physical Medicine and Rehabilitation Consult Reason for Consult: Left femoral neck fracture Referring Physician: [facility]   HPI: [name]. is a 79 y.o. right-handed male with history of chronic renal insufficiency his baseline creatinine 1.8-2.0, legally blind, diabetes mellitus with peripheral neuropathy and history of right second toe amputation 01/03/2013 for osteomyelitis. Admitted 08/07/2013 after a fall while walking to the bathroom landing on his left hip without loss of consciousness. He denied any chest pain or syncope associated with fall. X-rays and imaging of the left femoral neck fracture. Underwent left cannulated hip pinning as well as steroid injection bilateral knees for degenerative joint disease 08/08/2013 per Dr. Marcelino Scot. Placed on subcutaneous Lovenox for DVT prophylaxis and advised partial weightbearing. Postoperative pain management. Physical and occupational therapy evaluations pending. M.D. is requested physical medicine rehabilitation consult to consider inpatient rehabilitation services  Review of Systems  HENT:       Legally blind  Gastrointestinal: Positive for constipation.  Genitourinary: Positive for urgency.  All other systems reviewed and are negative.   Past Medical History  Diagnosis Date  . Hypothyroid   . BPH (benign prostatic hyperplasia)   . Renal insufficiency   . Glucose intolerance (impaired glucose tolerance)   . Complication of anesthesia     "a tough time"  -pt unable to explain  . Hypertension   . Pancreatitis    Past Surgical History  Procedure Laterality Date  . Abdominal aortic aneurysm repair    . Prostate surgery    . Amputation Right 01/03/2013    Procedure: AMPUTATION DIGIT;  Surgeon: Wylene Simmer, MD;  Location: [facility] ORS;  Service: Orthopedics;  Laterality: Right;  RIGHT 2ND TOE AMPUTATION   Family History  Problem Relation Age of Onset  . Heart attack Mother   . Heart attack Father    Social History:  reports that he quit  smoking about 10 months ago. He has never used smokeless tobacco. He reports that  drinks alcohol. He reports that he does not use illicit drugs. Allergies:  Allergies  Allergen Reactions  . Aspirin    Medications Prior to Admission  Medication Sig Dispense Refill  . levothyroxine (SYNTHROID, LEVOTHROID) 88 MCG tablet Take 88 mcg by mouth every morning.      . loperamide (IMODIUM) 2 MG capsule Take 2 mg by mouth 4 (four) times daily as needed for diarrhea or loose stools.        Home:    Functional History:   Functional Status:  Mobility:          ADL:    Cognition: Cognition Orientation Level: Oriented X4    Blood pressure 139/52, pulse 70, temperature 97.8 F (36.6 C), temperature source Oral, resp. rate 18, height 5\' 10"  (1.778 m), weight 74.844 kg (165 lb), SpO2 97.00%. Physical Exam  Vitals reviewed. Constitutional: He is oriented to person, place, and time.  HENT:  Head: Normocephalic.  Neck: Normal range of motion. Neck supple. No thyromegaly present.  Cardiovascular: Normal rate and regular rhythm.   Pulmonary/Chest: Effort normal and breath sounds normal. No respiratory distress.  Abdominal: Soft. Bowel sounds are normal. He exhibits distension.  Musculoskeletal:  Left hip tender. Right middle toe missing  Neurological: He is alert and oriented to person, place, and time.  Follow simple commands. Distal, stocking glove sensory loss, intrinsic minus feet  Skin:  Hip incision is dressed and appropriately tender  Psychiatric: He has a normal mood and affect.    Results for orders placed during the hospital  encounter of 08/07/13 (from the past 24 hour(s))  GLUCOSE, CAPILLARY     Status: Abnormal   Collection Time    08/07/13  4:18 PM      Result Value Range   Glucose-Capillary 122 (*) 70 - 99 mg/dL   Comment 1 Notify RN     Comment 2 Documented in Chart    SURGICAL PCR SCREEN     Status: None   Collection Time    08/07/13  4:22 PM      Result Value  Range   MRSA, PCR NEGATIVE  NEGATIVE   Staphylococcus aureus NEGATIVE  NEGATIVE  URINALYSIS, ROUTINE W REFLEX MICROSCOPIC     Status: Abnormal   Collection Time    08/07/13  9:21 PM      Result Value Range   Color, Urine YELLOW  YELLOW   APPearance CLEAR  CLEAR   Specific Gravity, Urine 1.011  1.005 - 1.030   pH 5.5  5.0 - 8.0   Glucose, UA NEGATIVE  NEGATIVE mg/dL   Hgb urine dipstick SMALL (*) NEGATIVE   Bilirubin Urine NEGATIVE  NEGATIVE   Ketones, ur NEGATIVE  NEGATIVE mg/dL   Protein, ur 30 (*) NEGATIVE mg/dL   Urobilinogen, UA 0.2  0.0 - 1.0 mg/dL   Nitrite NEGATIVE  NEGATIVE   Leukocytes, UA NEGATIVE  NEGATIVE  URINE MICROSCOPIC-ADD ON     Status: None   Collection Time    08/07/13  9:21 PM      Result Value Range   Squamous Epithelial / LPF RARE  RARE   RBC / HPF 7-10  <3 RBC/hpf   Bacteria, UA RARE  RARE  GLUCOSE, CAPILLARY     Status: Abnormal   Collection Time    08/07/13  9:42 PM      Result Value Range   Glucose-Capillary 110 (*) 70 - 99 mg/dL  COMPREHENSIVE METABOLIC PANEL     Status: Abnormal   Collection Time    08/08/13  5:33 AM      Result Value Range   Sodium 134 (*) 135 - 145 mEq/L   Potassium 4.1  3.5 - 5.1 mEq/L   Chloride 107  96 - 112 mEq/L   CO2 13 (*) 19 - 32 mEq/L   Glucose, Bld 104 (*) 70 - 99 mg/dL   BUN 38 (*) 6 - 23 mg/dL   Creatinine, Ser 1.92 (*) 0.50 - 1.35 mg/dL   Calcium 7.4 (*) 8.4 - 10.5 mg/dL   Total Protein 5.7 (*) 6.0 - 8.3 g/dL   Albumin 2.9 (*) 3.5 - 5.2 g/dL   AST 11  0 - 37 U/L   ALT 12  0 - 53 U/L   Alkaline Phosphatase 81  39 - 117 U/L   Total Bilirubin 0.3  0.3 - 1.2 mg/dL   GFR calc non Af Amer 31 (*) >90 mL/min   GFR calc Af Amer 36 (*) >90 mL/min  CBC     Status: Abnormal   Collection Time    08/08/13  5:33 AM      Result Value Range   WBC 4.9  4.0 - 10.5 K/uL   RBC 2.89 (*) 4.22 - 5.81 MIL/uL   Hemoglobin 9.3 (*) 13.0 - 17.0 g/dL   HCT 26.9 (*) 39.0 - 52.0 %   MCV 93.1  78.0 - 100.0 fL   MCH 32.2  26.0 -  34.0 pg   MCHC 34.6  30.0 - 36.0 g/dL   RDW 15.5  11.5 -  15.5 %   Platelets 94 (*) 150 - 400 K/uL  GLUCOSE, CAPILLARY     Status: Abnormal   Collection Time    08/08/13  6:22 AM      Result Value Range   Glucose-Capillary 112 (*) 70 - 99 mg/dL  TYPE AND SCREEN     Status: None   Collection Time    08/08/13  7:50 AM      Result Value Range   ABO/RH(D) O POS     Antibody Screen NEG     Sample Expiration 08/11/2013     Unit Number [id number]     Blood Component Type RED CELLS,LR     Unit division 00     Status of Unit ALLOCATED     Transfusion Status OK TO TRANSFUSE     Crossmatch Result Compatible     Unit Number [id number]     Blood Component Type RED CELLS,LR     Unit division 00     Status of Unit ALLOCATED     Transfusion Status OK TO TRANSFUSE     Crossmatch Result Compatible    GLUCOSE, CAPILLARY     Status: Abnormal   Collection Time    08/08/13 10:22 AM      Result Value Range   Glucose-Capillary 118 (*) 70 - 99 mg/dL   Comment 1 Documented in Chart     Comment 2 Notify RN     Dg Chest 1 View  08/07/2013   CLINICAL DATA:  Trauma with left chest pain.  EXAM: CHEST - 1 VIEW  COMPARISON:  05/30/2010.  FINDINGS: Normal heart size. Aortic atherosclerosis. No infiltrate or edema. No effusion. No pneumothorax (edge over the lower right chest is likely a skin fold.  IMPRESSION: No active disease.   Electronically Signed   By: Jorje Guild   On: 08/07/2013 04:19   Dg Pelvis 1-2 Views  08/07/2013   *RADIOLOGY REPORT*  Clinical Data: Left hip pain, fall  PELVIS - 1-2 VIEW  Comparison: None available  Findings: There is a subtle acute nondisplaced fracture through the left femoral neck.  The left femoral head remains normally located within the acetabulum.  Femoral head height is preserved.  There is no pubic diastasis.  Limited views of the right hip grossly normal. SI joints are approximated.  IMPRESSION: Acute nondisplaced fracture of the left femoral neck.   Original  Report Authenticated By: Jeannine Boga, M.D.   Dg Hip Operative Left  08/08/2013   CLINICAL DATA:  Cannulated pinning of left hip.  EXAM: OPERATIVE LEFT HIP  COMPARISON:  Plain films 08/07/2013  FINDINGS: Multiple intraoperative spot images demonstrate placement of for screws across the left femoral neck fracture. No complicating features. Normal alignment. .  IMPRESSION: Internal fixation of the left femoral neck fracture.   Electronically Signed   By: Rolm Baptise M.D.   On: 08/08/2013 10:40   Dg Femur Left  08/07/2013   *RADIOLOGY REPORT*  Clinical Data: Fall, left hip pain  LEFT FEMUR - 2 VIEW  Comparison: None.  Findings: There is an acute nondisplaced fracture through the left femoral neck.  Femoral head remains in normal alignment with the acetabulum.  The femur is otherwise intact.  Degenerative osteoarthrosis seen about the left knee.  Vascular calcifications are noted within the left side.  IMPRESSION: Acute nondisplaced fracture of the left femoral neck.   Original Report Authenticated By: Jeannine Boga, M.D.   Ct Head Wo Contrast  08/07/2013   *RADIOLOGY REPORT*  Clinical Data: Fall, dizziness  CT HEAD WITHOUT CONTRAST  Technique:  Contiguous axial images were obtained from the base of the skull through the vertex without contrast.  Comparison: Prior CT from 05/27/2010 no  Findings: Moderate diffuse age related atrophy is present. Scattered and confluent hypodensity within the periventricular white matter is consistent with chronic small vessel ischemic changes.  No acute intracranial hemorrhage or infarct.  No mass or midline shift.  No extra-axial fluid collection.  Calvarium is intact.  Scalp soft tissues are normal.  Orbital soft tissues are within normal limits.  Paranasal sinuses and mastoid air cells are clear.  IMPRESSION: Moderate age related atrophy with chronic microvascular ischemic disease.  No acute intracranial abscess identified.   Original Report Authenticated By:  Jeannine Boga, M.D.   Dg Pelvis Portable  08/08/2013   CLINICAL DATA:  Fracture fixation  EXAM: PORTABLE PELVIS  COMPARISON:  08/07/2013  FINDINGS: Three pins are present across the left femoral neck fracture. Fracture in satisfactory alignment. Pins are a the in satisfactory position. No other fracture  IMPRESSION: Satisfactory left hip pinning   Electronically Signed   By: Franchot Gallo M.D.   On: 08/08/2013 11:44   Dg Knee Left Port  08/07/2013   CLINICAL DATA:  Left femoral neck fracture.  EXAM: PORTABLE LEFT KNEE - 1-2 VIEW  COMPARISON:  None.  FINDINGS: Study is limited given the proximal femoral fracture. No acute bony abnormality. No fracture, subluxation or dislocation visualized. No significant joint effusion.  IMPRESSION: Limited study. No acute findings.   Electronically Signed   By: Rolm Baptise M.D.   On: 08/07/2013 10:49   Dg Knee Right Port  08/07/2013   CLINICAL DATA:  Pain and in both legs.  EXAM: PORTABLE RIGHT KNEE - 1-2 VIEW  COMPARISON:  Knee film 08/05/2008  FINDINGS: There has been interval progression of joint space narrowing of the medial and lateral compartment of the right knee. No evidence of acute fracture. No joint effusion.  IMPRESSION: Advanced loss of joint space in the medial and lateral compartment is likely related to osteoarthritis. No acute findings.   Electronically Signed   By: Suzy Bouchard M.D.   On: 08/07/2013 10:21    Assessment/Plan: Diagnosis: left femoral neck fx s/p pinning 1. Does the need for close, 24 hr/day medical supervision in concert with the patient's rehab needs make it unreasonable for this patient to be served in a less intensive setting? Yes and Potentially 2. Co-Morbidities requiring supervision/potential complications: diabetes with peripheral neuropathy, ckd 3. Due to bladder management, bowel management, safety, skin/wound care, disease management, medication administration, pain management and patient education, does the  patient require 24 hr/day rehab nursing? Yes 4. Does the patient require coordinated care of a physician, rehab nurse, PT (1-2 hrs/day, 5 days/week) and OT (1-2 hrs/day, 5 days/week) to address physical and functional deficits in the context of the above medical diagnosis(es)? Yes Addressing deficits in the following areas: balance, endurance, locomotion, strength, transferring, bowel/bladder control, bathing, dressing, feeding, grooming and toileting 5. Can the patient actively participate in an intensive therapy program of at least 3 hrs of therapy per day at least 5 days per week? Yes 6. The potential for patient to make measurable gains while on inpatient rehab is excellent 7. Anticipated functional outcomes upon discharge from inpatient rehab are supervision to min assist with PT, supervision to minimal assist with OT, n/a with SLP. 8. Estimated rehab length of stay to reach the above functional goals is: 2 weeks  9. Does the patient have adequate social supports to accommodate these discharge functional goals? Potentially 10. Anticipated D/C setting: Home 11. Anticipated post D/C treatments: [place] therapy 12. Overall Rehab/Functional Prognosis: excellent  RECOMMENDATIONS: This patient's condition is appropriate for continued rehabilitative care in the following setting: CIR Patient has agreed to participate in recommended program. Yes Note that insurance prior authorization may be required for reimbursement for recommended care.  Comment: The patient is concerned about the cost of future therapies. Rehab RN to follow up.   Meredith Staggers, MD, [facility]     08/08/2013

## 2013-08-08 NOTE — Brief Op Note (Addendum)
08/07/2013 - 08/08/2013  10:31 AM  PATIENT:  [name].  79 y.o. male  PRE-OPERATIVE DIAGNOSIS:  LEFT FEMORAL NECK FRACTURE, bilateral knee DJD  POST-OPERATIVE DIAGNOSIS:  LEFT FEMORAL NECK FRACTURE, bilateral knee DJD  PROCEDURE:  Procedure(s): LEFT CANNULATED HIP PINNING (Left) STEROID INJECTION (Bilateral) knees  SURGEON:  Surgeon(s) and Role:    * Rozanna Box, MD - Primary  PHYSICIAN ASSISTANT: None  ANESTHESIA:   general  EBL:  Total I/O In: 1400 [I.V.:1400] Out: 400 [Urine:350; Blood:50]  BLOOD ADMINISTERED:none  DRAINS: none   LOCAL MEDICATIONS USED:  MArcaine and Kenalog  SPECIMEN:  No Specimen  DISPOSITION OF SPECIMEN:  N/A  COUNTS:  YES  TOURNIQUET:  * No tourniquets in log *  DICTATIONYC:[id number]  PLAN OF CARE: Admit to inpatient   PATIENT DISPOSITION:  PACU - hemodynamically stable.   Delay start of Pharmacological VTE agent (>24hrs) due to surgical blood loss or risk of bleeding: no

## 2013-08-09 DIAGNOSIS — S72009A Fracture of unspecified part of neck of unspecified femur, initial encounter for closed fracture: Secondary | ICD-10-CM

## 2013-08-09 DIAGNOSIS — W19XXXA Unspecified fall, initial encounter: Secondary | ICD-10-CM

## 2013-08-09 LAB — GLUCOSE, CAPILLARY
Glucose-Capillary: 237 mg/dL — ABNORMAL HIGH (ref 70–99)
Glucose-Capillary: 141 mg/dL — ABNORMAL HIGH (ref 70–99)
Glucose-Capillary: 191 mg/dL — ABNORMAL HIGH (ref 70–99)

## 2013-08-09 LAB — BASIC METABOLIC PANEL
BUN: 38 mg/dL — ABNORMAL HIGH (ref 6–23)
Chloride: 105 mEq/L (ref 96–112)
Glucose, Bld: 227 mg/dL — ABNORMAL HIGH (ref 70–99)
Potassium: 4.7 mEq/L (ref 3.5–5.1)

## 2013-08-09 LAB — CBC
HCT: 26.8 % — ABNORMAL LOW (ref 39.0–52.0)
Hemoglobin: 9.4 g/dL — ABNORMAL LOW (ref 13.0–17.0)
MCHC: 35.1 g/dL (ref 30.0–36.0)
WBC: 7.8 10*3/uL (ref 4.0–10.5)

## 2013-08-09 MED ORDER — ACETAMINOPHEN 500 MG PO TABS: 500.0000 mg | ORAL_TABLET | Freq: Four times a day (QID) | ORAL | Status: DC | PRN

## 2013-08-09 MED ORDER — ENOXAPARIN SODIUM 40 MG/0.4ML ~~LOC~~ SOLN: 40.0000 mg | SUBCUTANEOUS | Status: DC

## 2013-08-09 MED ORDER — HYDROCODONE-ACETAMINOPHEN 5-325 MG PO TABS: 1.0000 | ORAL_TABLET | ORAL | Status: DC | PRN

## 2013-08-09 NOTE — Progress Notes (Signed)
[facility] PROGRESS NOTE  [name]. [id number] DOB: 12-Apr-1931 DOA: 08/07/2013 PCP: Wyatt Haste, MD  Assessment/Plan:  Hip fracture, left/preop evaluation:  - s/p surgery -PT/OT- patient would like to avoid SNF- agreeable to [facility] or CIR  Hypothyroid:  - Continue Synthroid.   Borderline diabetes mellitus:  - On no Medications at home check hemoglobin A1c we'll start him on sliding scale insulin once he is able to tolerate orals.  -carb modified diet  CKD (chronic kidney disease), stage III:  - At baseline continue bicarbonate tablets.   Metabolic acidosis, increased anion gap:  - At baseline.   Anemia of chronic disease  - At baseline.  Code Status: full Family Communication: patient Disposition Plan: await PT  Consultants:  ortho  Procedures: LEFT CANNULATED HIP PINNING (Left)  STEROID INJECTION (Bilateral) knees   Antibiotics:    HPI/Subjective: Patient refusing SNF- would consider H/H or CIR No CP, no SOB  Objective: Filed Vitals:   08/09/13 0649  BP: 158/60  Pulse: 95  Temp: 98.1 F (36.7 C)  Resp: 18    Intake/Output Summary (Last 24 hours) at 08/09/13 0920 Last data filed at 08/09/13 0900  Gross per 24 hour  Intake 2971.25 ml  Output   3300 ml  Net -328.75 ml   Filed Weights   08/07/13 0302  Weight: 74.844 kg (165 lb)    Exam:   General:  sleepy  Cardiovascular: rrr  Respiratory: clear   Abdomen: +BS, soft  Musculoskeletal: no edema   Data Reviewed: Basic Metabolic Panel:  Recent Labs Lab 08/07/13 0429 08/07/13 1415 08/08/13 0533 08/09/13 0540  NA 140  --  134* 135  K 4.2  --  4.1 4.7  CL 109  --  107 105  CO2 16*  --  13* 15*  GLUCOSE 122*  --  104* 227*  BUN 37*  --  38* 38*  CREATININE 1.80* 1.81* 1.92* 1.69*  CALCIUM 7.9*  --  7.4* 7.2*   Liver Function Tests:  Recent Labs Lab 08/08/13 0533  AST 11  ALT 12  ALKPHOS 81  BILITOT 0.3  PROT 5.7*  ALBUMIN 2.9*   No results  found for this basename: LIPASE, AMYLASE,  in the last 168 hours No results found for this basename: AMMONIA,  in the last 168 hours CBC:  Recent Labs Lab 08/07/13 0429 08/07/13 1415 08/08/13 0533 08/09/13 0540  WBC 6.0 5.3 4.9 7.8  NEUTROABS 4.4  --   --   --   HGB 9.3* 9.2* 9.3* 9.4*  HCT 28.1* 26.7* 26.9* 26.8*  MCV 96.2 92.7 93.1 91.5  PLT 93* 97* 94* 90*   Cardiac Enzymes: No results found for this basename: CKTOTAL, CKMB, CKMBINDEX, TROPONINI,  in the last 168 hours BNP (last 3 results) No results found for this basename: PROBNP,  in the last 8760 hours CBG:  Recent Labs Lab 08/08/13 0622 08/08/13 1022 08/08/13 1629 08/08/13 2145 08/09/13 0653  GLUCAP 112* 118* 146* 297* 223*    Recent Results (from the past 240 hour(s))  SURGICAL PCR SCREEN     Status: None   Collection Time    08/07/13  4:22 PM      Result Value Range Status   MRSA, PCR NEGATIVE  NEGATIVE Final   Staphylococcus aureus NEGATIVE  NEGATIVE Final   Comment:            The Xpert SA Assay (FDA     approved for NASAL specimens     in  patients over 77 years of age),     is one component of     a comprehensive surveillance     program.  Test performance has     been validated by [employer] for patients greater     than or equal to 5 year old.     It is not intended     to diagnose infection nor to     guide or monitor treatment.     Studies: Dg Hip Operative Left  08/08/2013   CLINICAL DATA:  Cannulated pinning of left hip.  EXAM: OPERATIVE LEFT HIP  COMPARISON:  Plain films 08/07/2013  FINDINGS: Multiple intraoperative spot images demonstrate placement of for screws across the left femoral neck fracture. No complicating features. Normal alignment. .  IMPRESSION: Internal fixation of the left femoral neck fracture.   Electronically Signed   By: Rolm Baptise M.D.   On: 08/08/2013 10:40   Dg Pelvis Portable  08/08/2013   CLINICAL DATA:  Fracture fixation  EXAM: PORTABLE PELVIS   COMPARISON:  08/07/2013  FINDINGS: Three pins are present across the left femoral neck fracture. Fracture in satisfactory alignment. Pins are a the in satisfactory position. No other fracture  IMPRESSION: Satisfactory left hip pinning   Electronically Signed   By: Franchot Gallo M.D.   On: 08/08/2013 11:44   Dg Knee Left Port  08/07/2013   CLINICAL DATA:  Left femoral neck fracture.  EXAM: PORTABLE LEFT KNEE - 1-2 VIEW  COMPARISON:  None.  FINDINGS: Study is limited given the proximal femoral fracture. No acute bony abnormality. No fracture, subluxation or dislocation visualized. No significant joint effusion.  IMPRESSION: Limited study. No acute findings.   Electronically Signed   By: Rolm Baptise M.D.   On: 08/07/2013 10:49   Dg Knee Right Port  08/07/2013   CLINICAL DATA:  Pain and in both legs.  EXAM: PORTABLE RIGHT KNEE - 1-2 VIEW  COMPARISON:  Knee film 08/05/2008  FINDINGS: There has been interval progression of joint space narrowing of the medial and lateral compartment of the right knee. No evidence of acute fracture. No joint effusion.  IMPRESSION: Advanced loss of joint space in the medial and lateral compartment is likely related to osteoarthritis. No acute findings.   Electronically Signed   By: Suzy Bouchard M.D.   On: 08/07/2013 10:21    Scheduled Meds: . calcitRIOL  0.25 mcg Oral Daily  . calcium carbonate  1 tablet Oral TID WC  . enoxaparin (LOVENOX) injection  40 mg Subcutaneous Q24H  . insulin aspart  0-9 Units Subcutaneous TID WC  . insulin aspart  3 Units Subcutaneous TID WC  . levothyroxine  88 mcg Oral QAC breakfast  . polyethylene glycol  17 g Oral Daily  . sodium bicarbonate  1,300 mg Oral BID  . sodium chloride  3 mL Intravenous Q12H   Continuous Infusions:    Principal Problem:   Hip fracture, left Active Problems:   Hypothyroid   Borderline diabetes mellitus   CKD (chronic kidney disease), stage III   Metabolic acidosis, increased anion gap   Anemia of  chronic disease    Time spent: [place], [place] [facility] Pager [phone]. If 7PM-7AM, please contact night-coverage at [email or web address], password [facility] 08/09/2013, 9:20 AM  LOS: 2 days

## 2013-08-09 NOTE — Progress Notes (Signed)
Pt has not been able to void since foley d/c @ 0900.  Bladder scan completed at 1840 showed 935 ml of urine in bladder.  I&O cath obtained @ 1900, 1265ml of clear yellow urine emptied from bladder.  Nsg to continue to monitor.

## 2013-08-09 NOTE — Op Note (Signed)
NAMEEVEN, POLIO NO.:  [id number]  MEDICAL RECORD NO.:  [id number]  LOCATION:  [id number]                        FACILITY:  [facility]  PHYSICIAN:  Astrid Divine. Marcelino Scot, M.D. DATE OF BIRTH:  June 29, 1931  DATE OF PROCEDURE:  08/08/2013 DATE OF DISCHARGE:                              OPERATIVE REPORT   PREOPERATIVE DIAGNOSES: 1. Left nondisplaced femoral neck fracture. 2. Bilateral knee degenerative joint disease.  POSTOPERATIVE DIAGNOSES: 1. Displaced left femoral neck fracture. 2. Bilateral degenerative joint disease.  PROCEDURE: 1. Cannulated screw fixation of the left femoral neck. 2. Bilateral knee steroid injection.  SURGEON:  Astrid Divine. Marcelino Scot, M.D.  ASSISTANT:  None.  ANESTHESIA:  General.  COMPLICATIONS:  None.  I/O:  1400 mL crystalloid, 350 mL urinary output.  EBL:  50.  DISPOSITION:  To PACU.  CONDITION:  Stable.  BRIEF SUMMARY AND INDICATION FOR PROCEDURE:  [name] is an 79 year old male, who sustained a left femoral neck fracture.  Preoperative evaluation demonstrated a vertically oriented fracture without significant angulation or displacement.  I discussed these x-ray findings with the patient as well as one of our local joint surgeons regarding possibilities for management.  I discussed clearly with the patient the potential for hemiarthroplasty which would minimize the chance of reoperation and allow for early weightbearing versus repair which did offer the chance to retain his native hip but could clearly result in failure feeling either secondary to AVN or loss of fixation and reduction.  The patient wished to proceed with internal fixation, and consent was obtained for same.  He also understood those risks to include malunion, nonunion, DVT, PE, heart attack, stroke, hip arthritis, and multiple others.  BRIEF SUMMARY OF PROCEDURE:  [name] was taken to the operating room where general anesthesia was induced.  He was positioned  on a standard radiolucent table so that traction would not be pulled on the hip and result in displacement.  C-arm was brought in and images obtained which demonstrated some slight displacement at the fracture at the most inferior aspect of the neck.  Rotation was dialed in appropriately and then 2 cannulated screws placed from lateral to medial to close down the fracture gap and interdigitate the neck into a reduced position. This was successful but did result in a slight comminution of the medial inferior neck.  Three screws were then placed in inverted triangle technique into the femoral neck and the most inferior transverse screw was withdrawn.  Final images showed appropriate reduction and screw trajectory and length.  Wound was irrigated thoroughly and then closed in standard layered fashion using 0 Vicryl, 2-0 Vicryl, and staples for the skin.  Sterile gently compressive dressing was applied.  Attention then turned to the knees where bilateral injections were performed with Marcaine and Kenalog.  No complications.  PROGNOSIS:  [name] will be touchdown weightbearing only on the left lower extremity given the fracture plane of his femoral neck fracture. Increased risk for loss of reduction and nonunion given the very slight displacement.  He will be on DVT prophylaxis with Lovenox for his hospital stay and 10 days after discharge.     Astrid Divine. Marcelino Scot, M.D.  MHH/MEDQ  D:  08/08/2013  T:  08/09/2013  Job:  [id number]

## 2013-08-09 NOTE — Evaluation (Signed)
Occupational Therapy Evaluation Patient Details Name: [name]. MRN: [id number] DOB: Oct 02, 1931 Today's Date: 08/09/2013 Time: [id number] OT Time Calculation (min): 36 min  OT Assessment / Plan / Recommendation History of present illness Pt. was admitted following a fall resulting in L femoral neck fx.  He also has anemia  of chronic disease, CKD III, s/p AAA repair in 2011.  He underwent screw fixation of his hip as well as bilateral knee steroid injections during surgery.   Clinical Impression   Pt demos decline in function with ADLs and ADL mobility and would benefit from acute OT services to address impairments to increase level of function and safety. Pt requires extensive A with ADLs/mobility with weight bearing restrictions/limitations. Pt not safe to d/c home with only HH, that would not be adequate at this time for his needs. Pt refusing SNF and wants CIR    OT Assessment  Patient needs continued OT Services    Follow Up Recommendations  SNF;Supervision/Assistance - 24 hour;Other (comment) (pt would like CIR or home, refusing SNF, HH would not be adequate at this time)    Barriers to Discharge Decreased caregiver support Pt lives at home with heis son, but states tht is son is only there sometimes and that he realizes that his home is not the safest place to d/c to right now in his condition  Equipment Recommendations  3 in 1 bedside comode    Recommendations for Other Services    Frequency  Min 2X/week    Precautions / Restrictions Precautions Precautions: Fall Restrictions Weight Bearing Restrictions: Yes LLE Weight Bearing: Touchdown weight bearing   Pertinent Vitals/Pain 5/10    ADL  Grooming: Performed;Wash/dry hands;Wash/dry face;Set up Where Assessed - Grooming: Unsupported sitting Upper Body Bathing: Simulated;Set up;Supervision/safety Lower Body Bathing: +1 Total assistance Upper Body Dressing: Performed;Supervision/safety;Set up Lower Body Dressing:  +1 Total assistance Toilet Transfer: Performed;+2 Total assistance Toilet Transfer: Patient Percentage: 70% Toilet Transfer Method: Sit to stand Toilet Transfer Equipment: Bedside commode Toileting - Clothing Manipulation and Hygiene: +1 Total assistance Where Assessed - Camera operator Manipulation and Hygiene: Standing Tub/Shower Transfer Method: Not assessed Transfers/Ambulation Related to ADLs: cues for correct hand placement, LE postioning ADL Comments: pt requires extensive assist with LB ADLs    OT Diagnosis: Generalized weakness;Acute pain  OT Problem List: Decreased strength;Pain;Decreased safety awareness;Decreased activity tolerance;Impaired balance (sitting and/or standing);Decreased knowledge of use of DME or AE OT Treatment Interventions: Self-care/ADL training;Therapeutic activities;Therapeutic exercise;Neuromuscular education;Patient/family education;DME and/or AE instruction;Balance training   OT Goals(Current goals can be found in the care plan section) Acute Rehab OT Goals Patient Stated Goal: To go home if I can't stay here for rehab " OT Goal Formulation: With patient Time For Goal Achievement: 08/16/13 Potential to Achieve Goals: Good ADL Goals Pt Will Perform Grooming: with min assist;with mod assist;standing;with caregiver independent in assisting Pt Will Perform Lower Body Bathing: with max assist;with mod assist Pt Will Perform Lower Body Dressing: with max assist;with mod assist;with caregiver independent in assisting Pt Will Transfer to Toilet: with total assist;with max assist;bedside commode Pt Will Perform Toileting - Clothing Manipulation and hygiene: with mod assist;with max assist;with caregiver independent in assisting  Visit Information  Last OT Received On: 08/09/13 Assistance Needed: +2 PT/OT Co-Evaluation/Treatment: Yes History of Present Illness: Pt. was admitted following a fall resulting in L femoral neck fx.  He also has anemia  of chronic  disease, CKD III, s/p AAA repair in 2011.  He underwent screw fixation of his hip as  well as bilateral knee steroid injections during surgery.       Prior [place] expects to be discharged to:: Private residence Living Arrangements: Other relatives Available Help at Discharge: Family Type of Home: House Home Access: Stairs to enter Technical brewer of Steps: 1 Home Layout: One level [place]: Cane - single point Prior Function Level of Independence: Independent Communication Communication: No difficulties Dominant Hand: Right         Vision/Perception Vision - History Baseline Vision: Other (comment) (legally blind per pt) Visual History: Macular degeneration Patient Visual Report: No change from baseline Perception Perception: Within Functional Limits   Cognition  Cognition Arousal/Alertness: Awake/alert Behavior During Therapy: WFL for tasks assessed/performed Overall Cognitive Status: Impaired/Different from baseline Area of Impairment: Memory Memory: Decreased short-term memory;Decreased recall of precautions    Extremity/Trunk Assessment Upper Extremity Assessment Upper Extremity Assessment: Overall WFL for tasks assessed;Generalized weakness Lower Extremity Assessment Lower Extremity Assessment: Defer to PT evaluation Cervical / Trunk Assessment Cervical / Trunk Assessment: Normal     Mobility Bed Mobility Bed Mobility: Supine to Sit Supine to Sit: With rails;HOB flat;5: Supervision Details for Bed Mobility Assistance: cues for safety and technique Transfers Transfers: Sit to Stand;Stand to Sit Sit to Stand: 1: +2 Total assist Sit to Stand: Patient Percentage: 60% Stand to Sit: 1: +2 Total assist Details for Transfer Assistance: cues for correct hand placement, LE postioning     Exercise     Balance     End of Session OT - End of Session Equipment Utilized During Treatment: Gait belt;Rolling  walker;Other (comment) (BSC) Activity Tolerance: Patient limited by fatigue Patient left: in chair;with call bell/phone within reach;with family/visitor present  GO     Britt Bottom 08/09/2013, 3:16 PM

## 2013-08-09 NOTE — Evaluation (Addendum)
Physical Therapy Evaluation Patient Details Name: [name]. MRN: [id number] DOB: [birth year] Today's Date: 08/09/2013 Time: [id number] PT Time Calculation (min): 38 min  PT Assessment / Plan / Recommendation History of Present Illness  Pt. was admitted following a fall resulting in L femoral neck fx.  He also has anemia  of chronic disease, CKD III, s/p AAA repair in 2011.  He underwent screw fixation of his hip as well as bilateral knee steroid injections during surgery.  Clinical Impression  Pt. presents tp PT with decreased functional mobility and gait, TDWB status, and some apparent memory issues.  He wishes to discharge home but has reportedly indicated to [facility] rep that he knows he needs to go for rehab and currently is agreeable to do so .  He will need post acute PT due to TDWB status and believe [facility] is best and most appropriate venue of rehab for him.. Pt. Reports he is legally blind and has macular degeneration.    PT Assessment  Patient needs continued PT services    Follow Up Recommendations  SNF;Supervision/Assistance - 24 hour    Does the patient have the potential to tolerate intense rehabilitation      Barriers to Discharge Decreased caregiver support      Equipment Recommendations  Rolling walker with 5" wheels    Recommendations for Other Services     Frequency Min 6X/week    Precautions / Restrictions Precautions Precautions: Fall Restrictions Weight Bearing Restrictions: Yes LLE Weight Bearing: Touchdown weight bearing   Pertinent Vitals/Pain See vitals tab       Mobility  Bed Mobility Bed Mobility: Supine to Sit Supine to Sit: With rails;HOB flat;5: Supervision Details for Bed Mobility Assistance: cues for safety and technique Transfers Transfers: Sit to Stand;Stand to Sit Sit to Stand: 1: +2 Total assist Sit to Stand: Patient Percentage: 60% Stand to Sit: 1: +2 Total assist Stand to Sit: Patient Percentage: 60% Details  for Transfer Assistance: cues for correct hand placement, LE postioning Ambulation/Gait Ambulation/Gait Assistance: 1: +2 Total assist Ambulation/Gait: Patient Percentage: 70% Ambulation Distance (Feet): 4 Feet Assistive device: Rolling walker Ambulation/Gait Assistance Details: Pt. needed cueing for maintaining TDWB status and assist for safety and stability Gait Pattern: Step-to pattern;Decreased step length - right;Decreased step length - left    Exercises General Exercises - Lower Extremity Ankle Circles/Pumps: AROM;Both;10 reps;Supine Quad Sets: AROM;Both;15 reps;Supine   PT Diagnosis: Difficulty walking;Abnormality of gait;Acute pain  PT Problem List: Decreased strength;Decreased activity tolerance;Decreased balance;Decreased mobility;Decreased knowledge of use of DME;Decreased knowledge of precautions;Pain PT Treatment Interventions: DME instruction;Gait training;Functional mobility training;Therapeutic activities;Therapeutic exercise;Balance training;Patient/family education     PT Goals(Current goals can be found in the care plan section) Acute Rehab PT Goals Patient Stated Goal: To go home if I can't stay here for rehab " PT Goal Formulation: With patient Time For Goal Achievement: 08/16/13 Potential to Achieve Goals: Good  Visit Information  Last PT Received On: 08/09/13 Assistance Needed: +2 PT/OT Co-Evaluation/Treatment: Yes History of Present Illness: Pt. was admitted following a fall resulting in L femoral neck fx.  He also has anemia  of chronic disease, CKD III, s/p AAA repair in 2011.  He underwent screw fixation of his hip as well as bilateral knee steroid injections during surgery.       Prior [place] expects to be discharged to:: Private residence Living Arrangements: Other relatives Available Help at Discharge: Family Type of Home: House Home Access: Stairs to enter  Entrance Stairs-Number of Steps: 1 Home Layout: One  level Home Equipment: Cane - single point Prior Function Level of Independence: Independent Communication Communication: No difficulties Dominant Hand: Right    Cognition  Cognition Arousal/Alertness: Awake/alert Behavior During Therapy: WFL for tasks assessed/performed Overall Cognitive Status: Impaired/Different from baseline Area of Impairment: Memory Memory: Decreased short-term memory;Decreased recall of precautions    Extremity/Trunk Assessment Upper Extremity Assessment Upper Extremity Assessment: Overall WFL for tasks assessed;Generalized weakness Lower Extremity Assessment Lower Extremity Assessment: Defer to PT evaluation LLE Deficits / Details: good ankle pump and quad set; not ogherwise tested due to post op status Cervical / Trunk Assessment Cervical / Trunk Assessment: Normal   Balance    End of Session PT - End of Session Equipment Utilized During Treatment: Gait belt Activity Tolerance: Patient tolerated treatment well Patient left: in chair;with call bell/phone within reach;with family/visitor present [facility] RN liason ) Nurse Communication: Mobility status;Precautions;Weight bearing status  GP     Ladona Ridgel 08/09/2013, 3:32 PM Gerlean Ren PT Acute Rehab Services [place] [phone]

## 2013-08-09 NOTE — Progress Notes (Signed)
[facility] Progress Note  Subjective  Doing well Left hip feels better than pre-op Does not want SNF but will consider CIR  Sugars have been running high Nursing reports that pt refused SSI this am as well   Review of Systems  Constitutional: Negative for fever and chills.  Eyes: Negative for blurred vision.  Respiratory: Negative for shortness of breath and wheezing.   Cardiovascular: Negative for chest pain and palpitations.  Gastrointestinal: Negative for nausea, vomiting and abdominal pain.  Neurological: Negative for tingling and sensory change.   Subjective  BP 186/67  Pulse 75  Temp(Src) 98.3 F (36.8 C) (Oral)  Resp 18  Ht 5\' 10"  (1.778 m)  Wt 74.844 kg (165 lb)  BMI 23.68 kg/m2  SpO2 100%  Intake/Output     09/18 0701 - 09/19 0700 09/19 0701 - 09/20 0700   P.O. 720 360   I.V. (mL/kg) 1941.3 (25.9) 900 (12)   IV Piggyback 50    Total Intake(mL/kg) 2711.3 (36.2) 1260 (16.8)   Urine (mL/kg/hr) 2850 (1.6) 400 (0.9)   Blood 50 (0)    Total Output 2900 400   Net -188.8 +860         Labs Results for BOSTIN, VITIELLO (MRN [id number]) as of 08/09/2013 12:45  Ref. Range 08/09/2013 05:40  Sodium Latest Range: 135-145 mEq/L 135  Potassium Latest Range: 3.5-5.1 mEq/L 4.7  Chloride Latest Range: 96-112 mEq/L 105  CO2 Latest Range: 19-32 mEq/L 15 (L)  BUN Latest Range: 6-23 mg/dL 38 (H)  Creatinine Latest Range: 0.50-1.35 mg/dL 1.69 (H)  Calcium Latest Range: 8.4-10.5 mg/dL 7.2 (L)  GFR calc non Af Amer Latest Range: >90 mL/min 37 (L)  GFR calc Af Amer Latest Range: >90 mL/min 42 (L)  Glucose Latest Range: 70-99 mg/dL 227 (H)  WBC Latest Range: 4.0-10.5 K/uL 7.8  RBC Latest Range: 4.22-5.81 MIL/uL 2.93 (L)  Hemoglobin Latest Range: 13.0-17.0 g/dL 9.4 (L)  HCT Latest Range: 39.0-52.0 % 26.8 (L)  MCV Latest Range: 78.0-100.0 fL 91.5  MCH Latest Range: 26.0-34.0 pg 32.1  MCHC Latest Range: 30.0-36.0 g/dL 35.1  RDW Latest Range: 11.5-15.5 % 14.9   Platelets Latest Range: 150-400 K/uL 90 (L)    Exam  Results for MANSEL, KIRGAN (MRN [id number]) as of 08/09/2013 12:45  Ref. Range 08/09/2013 05:40  Sodium Latest Range: 135-145 mEq/L 135  Potassium Latest Range: 3.5-5.1 mEq/L 4.7  Chloride Latest Range: 96-112 mEq/L 105  CO2 Latest Range: 19-32 mEq/L 15 (L)  BUN Latest Range: 6-23 mg/dL 38 (H)  Creatinine Latest Range: 0.50-1.35 mg/dL 1.69 (H)  Calcium Latest Range: 8.4-10.5 mg/dL 7.2 (L)  GFR calc non Af Amer Latest Range: >90 mL/min 37 (L)  GFR calc Af Amer Latest Range: >90 mL/min 42 (L)  Glucose Latest Range: 70-99 mg/dL 227 (H)  WBC Latest Range: 4.0-10.5 K/uL 7.8  RBC Latest Range: 4.22-5.81 MIL/uL 2.93 (L)  Hemoglobin Latest Range: 13.0-17.0 g/dL 9.4 (L)  HCT Latest Range: 39.0-52.0 % 26.8 (L)  MCV Latest Range: 78.0-100.0 fL 91.5  MCH Latest Range: 26.0-34.0 pg 32.1  MCHC Latest Range: 30.0-36.0 g/dL 35.1  RDW Latest Range: 11.5-15.5 % 14.9  Platelets Latest Range: 150-400 K/uL 90 (L)    Exam  Gen: sitting up in bed, comfortable, NAD Ext:     Left Lower Extremity   Dressing c/d/i  Incision pristine  Swelling controlled L leg  Distal motor and sensory functions intact, at baseline   Ext warm  + DP pulse  Assessment and Plan   79 y/o male s/p fall w/ Left femoral neck fracture  1. Fall 2. Left femoral neck fracture POD 1 percutaneous fixation  TDWB x 8 weeks  No ROM restrictions  PT/OT  Dressing changes PRN  Ice and elevate  TED hose to L leg  3. Thrombocytopenia  plts have been in 90k's since admission   Continue to monitor   May need to hold lovenox  No active bleeding noted as of now, continue with lovenox   4. DM  Discussed with the pt the importance of keeping his sugars under control and that he is at increased risk for infection with sugars consistently >200.   He understands this and will accept insulin when it is offered to him  5. Medical issues   Per primary team  6. DVT/PE  prophylaxis  Lovenox  Continue to monitor platelets  7. FEN  CHO modified diet   8. Pain  Low dose narcotics  9. Dispo  PT/OT evals  CIR eval    Jari Pigg, PA-C [facility] [phone] (P) 08/09/2013 1:00 PM

## 2013-08-10 DIAGNOSIS — R339 Retention of urine, unspecified: Secondary | ICD-10-CM | POA: Diagnosis present

## 2013-08-10 DIAGNOSIS — N400 Enlarged prostate without lower urinary tract symptoms: Secondary | ICD-10-CM

## 2013-08-10 DIAGNOSIS — D62 Acute posthemorrhagic anemia: Secondary | ICD-10-CM | POA: Clinically undetermined

## 2013-08-10 LAB — CBC
HCT: 26.4 % — ABNORMAL LOW (ref 39.0–52.0)
Hemoglobin: 9.3 g/dL — ABNORMAL LOW (ref 13.0–17.0)
MCH: 32 pg (ref 26.0–34.0)
MCHC: 35.2 g/dL (ref 30.0–36.0)
MCV: 90.7 fL (ref 78.0–100.0)
Platelets: 107 10*3/uL — ABNORMAL LOW (ref 150–400)
RBC: 2.91 MIL/uL — ABNORMAL LOW (ref 4.22–5.81)
RDW: 14.9 % (ref 11.5–15.5)
WBC: 7.8 10*3/uL (ref 4.0–10.5)

## 2013-08-10 LAB — BASIC METABOLIC PANEL
BUN: 56 mg/dL — ABNORMAL HIGH (ref 6–23)
CO2: 16 mEq/L — ABNORMAL LOW (ref 19–32)
Calcium: 8 mg/dL — ABNORMAL LOW (ref 8.4–10.5)
Chloride: 105 mEq/L (ref 96–112)
Creatinine, Ser: 1.7 mg/dL — ABNORMAL HIGH (ref 0.50–1.35)
GFR calc Af Amer: 42 mL/min — ABNORMAL LOW (ref >90)
GFR calc non Af Amer: 36 mL/min — ABNORMAL LOW (ref >90)
Glucose, Bld: 159 mg/dL — ABNORMAL HIGH (ref 70–99)
Potassium: 4.3 mEq/L (ref 3.5–5.1)
Sodium: 137 mEq/L (ref 135–145)

## 2013-08-10 LAB — URINALYSIS, ROUTINE W REFLEX MICROSCOPIC
Bilirubin Urine: NEGATIVE
Leukocytes, UA: NEGATIVE
Nitrite: NEGATIVE
Specific Gravity, Urine: 1.008 (ref 1.005–1.030)
Urobilinogen, UA: 0.2 mg/dL (ref 0.0–1.0)
pH: 5.5 (ref 5.0–8.0)

## 2013-08-10 LAB — GLUCOSE, CAPILLARY: Glucose-Capillary: 94 mg/dL (ref 70–99)

## 2013-08-10 LAB — URINE MICROSCOPIC-ADD ON

## 2013-08-10 MED ORDER — HYDRALAZINE HCL 20 MG/ML IJ SOLN
10.0000 mg | Freq: Four times a day (QID) | INTRAMUSCULAR | Status: DC | PRN
  Administered 2013-08-10: 10 mg via INTRAVENOUS
  Filled 2013-08-10: qty 1

## 2013-08-10 MED ORDER — GLYCERIN (LAXATIVE) 2.1 G RE SUPP
1.0000 | Freq: Every day | RECTAL | Status: DC | PRN
  Filled 2013-08-10: qty 1

## 2013-08-10 MED ORDER — TAMSULOSIN HCL 0.4 MG PO CAPS
0.4000 mg | ORAL_CAPSULE | Freq: Every day | ORAL | Status: DC
  Administered 2013-08-10 – 2013-08-11 (×2): 0.4 mg via ORAL
  Filled 2013-08-10 (×3): qty 1

## 2013-08-10 NOTE — Progress Notes (Signed)
Orthopaedic Trauma Service Progress Note  Subjective No new issues Doing well Doesn't want SNF Would prefer CIR where he can do intensive therapy  Noted issues with voiding, U/A negative   Review of Systems  Constitutional: Negative for fever and chills.  Eyes:       Legally blind   Respiratory: Negative for shortness of breath.   Cardiovascular: Negative for chest pain and palpitations.  Gastrointestinal: Negative for nausea, vomiting and abdominal pain.  Genitourinary:       + retention   Neurological: Negative for tingling and sensory change.    Objective  BP 193/72  Pulse 60  Temp(Src) 98.1 F (36.7 C) (Oral)  Resp 16  Ht 5\' 10"  (1.778 m)  Wt 74.844 kg (165 lb)  BMI 23.68 kg/m2  SpO2 99%  Intake/Output     09/19 0701 - 09/20 0700 09/20 0701 - 09/21 0700   P.O. 1080    I.V. (mL/kg) 900 (12)    IV Piggyback     Total Intake(mL/kg) 1980 (26.5)    Urine (mL/kg/hr) 4225 (2.4)    Blood     Total Output 4225     Net -2245          Urine Occurrence 1 x      Labs  Results for DRESDEN, STELLMACHER (MRN [id number]) as of 08/10/2013 09:38  Ref. Range 08/10/2013 06:35  Sodium Latest Range: 135-145 mEq/L 137  Potassium Latest Range: 3.5-5.1 mEq/L 4.3  Chloride Latest Range: 96-112 mEq/L 105  CO2 Latest Range: 19-32 mEq/L 16 (L)  BUN Latest Range: 6-23 mg/dL 56 (H)  Creatinine Latest Range: 0.50-1.35 mg/dL 1.70 (H)  Calcium Latest Range: 8.4-10.5 mg/dL 8.0 (L)  GFR calc non Af Amer Latest Range: >90 mL/min 36 (L)  GFR calc Af Amer Latest Range: >90 mL/min 42 (L)  Glucose Latest Range: 70-99 mg/dL 159 (H)  WBC Latest Range: 4.0-10.5 K/uL 7.8  RBC Latest Range: 4.22-5.81 MIL/uL 2.91 (L)  Hemoglobin Latest Range: 13.0-17.0 g/dL 9.3 (L)  HCT Latest Range: 39.0-52.0 % 26.4 (L)  MCV Latest Range: 78.0-100.0 fL 90.7  MCH Latest Range: 26.0-34.0 pg 32.0  MCHC Latest Range: 30.0-36.0 g/dL 35.2  RDW Latest Range: 11.5-15.5 % 14.9  Platelets Latest Range: 150-400 K/uL 107  (L)   Results for RUSTYN, CHARNLEY (MRN [id number]) as of 08/10/2013 09:38  Ref. Range 08/10/2013 02:41  Color, Urine Latest Range: YELLOW  YELLOW  APPearance Latest Range: CLEAR  CLEAR  Specific Gravity, Urine Latest Range: 1.005-1.030  1.008  pH Latest Range: 5.0-8.0  5.5  Glucose Latest Range: NEGATIVE mg/dL NEGATIVE  Bilirubin Urine Latest Range: NEGATIVE  NEGATIVE  Ketones, ur Latest Range: NEGATIVE mg/dL NEGATIVE  Protein Latest Range: NEGATIVE mg/dL NEGATIVE  Urobilinogen, UA Latest Range: 0.0-1.0 mg/dL 0.2  Nitrite Latest Range: NEGATIVE  NEGATIVE  Leukocytes, UA Latest Range: NEGATIVE  NEGATIVE  Hgb urine dipstick Latest Range: NEGATIVE  TRACE (A)  RBC / HPF Latest Range: <3 RBC/hpf 0-2  Squamous Epithelial / LPF Latest Range: RARE  RARE  Bacteria, UA Latest Range: RARE  RARE    CBG (last 3)   Recent Labs  08/09/13 1622 08/09/13 2109 08/10/13 0816  GLUCAP 141* 191* 192*    Exam  Gen: sitting up in bed, comfortable, NAD Ext:      Left Lower Extremity             Dressing c/d/i           Incision pristine  Swelling controlled L leg           Distal motor and sensory functions intact, at baseline             Ext warm           + DP pulse    Assessment and Plan   79 y/o male s/p fall w/ Left femoral neck fracture  1. Fall 2. Left femoral neck fracture POD 2 percutaneous fixation           TDWB x 8 weeks           No ROM restrictions           PT/OT           Dressing changes PRN           Ice and elevate           TED hose to L leg  3. Thrombocytopenia           plt count improved today            Continue to monitor             continue with lovenox   4. DM           sugar control slightly better   Continue with tight control   5. Medical issues             Per primary team  6. DVT/PE prophylaxis           Lovenox x 14 days            Continue to monitor platelets  7. FEN           CHO modified diet   8. Urinary retention    Will start flomax 0.4 mg po daily    9. Pain           Low dose narcotics  10. Dispo           continue with therapies   CIR indicates he is a candidate, likely pending insurance authorization.   Ortho issues stable   Jari Pigg, PA-C [facility] [phone] (P) 08/10/2013 9:44 AM

## 2013-08-10 NOTE — Progress Notes (Addendum)
Clinical Social Work Department CLINICAL SOCIAL WORK PLACEMENT NOTE 08/10/2013  Patient:  [name], [name]  Account Number:  [id number] Admit date:  08/07/2013  Clinical Social Worker:  Blima Rich, Latanya Presser  Date/time:  08/10/2013 06:37 PM  Clinical Social Work is seeking post-discharge placement for this patient at the following level of care:   [place]   (*CSW will update this form in Epic as items are completed)   08/10/2013  Patient/family provided with [facility] of Clinical Social Work's list of facilities offering this level of care within the geographic area requested by the patient (or if unable, by the patient's family).  08/10/2013  Patient/family informed of their freedom to choose among providers that offer the needed level of care, that participate in Medicare, Medicaid or managed care program needed by the patient, have an available bed and are willing to accept the patient.  08/10/2013  Patient/family informed of [facility]' ownership interest in [facility], as well as of the fact that they are under no obligation to receive care at this facility.  PASARR submitted to EDS on Pre-existing PASARR number received from EDS on   FL2 transmitted to all facilities in geographic area requested by pt/family on  08/10/2013 FL2 transmitted to all facilities within larger geographic area on   Patient informed that his/her managed care company has contracts with or will negotiate with  certain facilities, including the following:     Patient/family informed of bed offers received:  08/12/2013 Patient chooses bed at [facility] Physician recommends and patient chooses bed at    Patient to be transferred to  on  08/12/2013 Patient to be transferred to facility by [facility]  The following physician request were entered in Epic:   Additional Comments: Patient and son [name] agreeable to SNF search in [facility] excluding [facility] as a back up to  CIR. Patient reported that [name].

## 2013-08-10 NOTE — Progress Notes (Signed)
[facility] PROGRESS NOTE  [name]. [id number] DOB: 01-16-1931 DOA: 08/07/2013 PCP: Wyatt Haste, MD  Assessment/Plan: Constipation -suppository  Urinary retention -add flomax -may need foley placed and left if patient is unable to void after BM  Hip fracture, left/preop evaluation:  - s/p surgery -PT/OT- patient agreeable to SNF- not safe at home  Hypothyroid:  - Continue Synthroid.   Borderline diabetes mellitus:  - On no Medications at home check hemoglobin A1c we'll start him on sliding scale insulin once he is able to tolerate orals.  -carb modified diet -Hgb A1C 5.5  CKD (chronic kidney disease), stage III:  - At baseline continue bicarbonate tablets.   Anemia of chronic disease  - At baseline.  Code Status: full Family Communication: patient Disposition Plan: await PT  Consultants:  ortho  Procedures: LEFT CANNULATED HIP PINNING (Left)  STEROID INJECTION (Bilateral) knees   Antibiotics:    HPI/Subjective: C/o constipation Agreeable to SNF now  Objective: Filed Vitals:   08/10/13 0524  BP: 193/72  Pulse: 60  Temp: 98.1 F (36.7 C)  Resp: 16    Intake/Output Summary (Last 24 hours) at 08/10/13 0928 Last data filed at 08/10/13 0200  Gross per 24 hour  Intake    720 ml  Output   3825 ml  Net  -3105 ml   Filed Weights   08/07/13 0302  Weight: 74.844 kg (165 lb)    Exam:   General:  sleepy  Cardiovascular: rrr  Respiratory: clear   Abdomen: +BS, soft  Musculoskeletal: no edema   Data Reviewed: Basic Metabolic Panel:  Recent Labs Lab 08/07/13 0429 08/07/13 1415 08/08/13 0533 08/09/13 0540 08/10/13 0635  NA 140  --  134* 135 137  K 4.2  --  4.1 4.7 4.3  CL 109  --  107 105 105  CO2 16*  --  13* 15* 16*  GLUCOSE 122*  --  104* 227* 159*  BUN 37*  --  38* 38* 56*  CREATININE 1.80* 1.81* 1.92* 1.69* 1.70*  CALCIUM 7.9*  --  7.4* 7.2* 8.0*   Liver Function Tests:  Recent Labs Lab  08/08/13 0533  AST 11  ALT 12  ALKPHOS 81  BILITOT 0.3  PROT 5.7*  ALBUMIN 2.9*   No results found for this basename: LIPASE, AMYLASE,  in the last 168 hours No results found for this basename: AMMONIA,  in the last 168 hours CBC:  Recent Labs Lab 08/07/13 0429 08/07/13 1415 08/08/13 0533 08/09/13 0540 08/10/13 0635  WBC 6.0 5.3 4.9 7.8 7.8  NEUTROABS 4.4  --   --   --   --   HGB 9.3* 9.2* 9.3* 9.4* 9.3*  HCT 28.1* 26.7* 26.9* 26.8* 26.4*  MCV 96.2 92.7 93.1 91.5 90.7  PLT 93* 97* 94* 90* 107*   Cardiac Enzymes: No results found for this basename: CKTOTAL, CKMB, CKMBINDEX, TROPONINI,  in the last 168 hours BNP (last 3 results) No results found for this basename: PROBNP,  in the last 8760 hours CBG:  Recent Labs Lab 08/09/13 0653 08/09/13 1127 08/09/13 1622 08/09/13 2109 08/10/13 0816  GLUCAP 223* 237* 141* 191* 192*    Recent Results (from the past 240 hour(s))  SURGICAL PCR SCREEN     Status: None   Collection Time    08/07/13  4:22 PM      Result Value Range Status   MRSA, PCR NEGATIVE  NEGATIVE Final   Staphylococcus aureus NEGATIVE  NEGATIVE Final   Comment:  The Xpert SA Assay (FDA     approved for NASAL specimens     in patients over 3 years of age),     is one component of     a comprehensive surveillance     program.  Test performance has     been validated by [employer] for patients greater     than or equal to 42 year old.     It is not intended     to diagnose infection nor to     guide or monitor treatment.     Studies: Dg Hip Operative Left  08/08/2013   CLINICAL DATA:  Cannulated pinning of left hip.  EXAM: OPERATIVE LEFT HIP  COMPARISON:  Plain films 08/07/2013  FINDINGS: Multiple intraoperative spot images demonstrate placement of for screws across the left femoral neck fracture. No complicating features. Normal alignment. .  IMPRESSION: Internal fixation of the left femoral neck fracture.   Electronically Signed   By:  Rolm Baptise M.D.   On: 08/08/2013 10:40   Dg Pelvis Portable  08/08/2013   CLINICAL DATA:  Fracture fixation  EXAM: PORTABLE PELVIS  COMPARISON:  08/07/2013  FINDINGS: Three pins are present across the left femoral neck fracture. Fracture in satisfactory alignment. Pins are a the in satisfactory position. No other fracture  IMPRESSION: Satisfactory left hip pinning   Electronically Signed   By: Franchot Gallo M.D.   On: 08/08/2013 11:44    Scheduled Meds: . calcitRIOL  0.25 mcg Oral Daily  . calcium carbonate  1 tablet Oral TID WC  . enoxaparin (LOVENOX) injection  40 mg Subcutaneous Q24H  . insulin aspart  0-9 Units Subcutaneous TID WC  . insulin aspart  3 Units Subcutaneous TID WC  . levothyroxine  88 mcg Oral QAC breakfast  . polyethylene glycol  17 g Oral Daily  . sodium bicarbonate  1,300 mg Oral BID  . sodium chloride  3 mL Intravenous Q12H  . tamsulosin  0.4 mg Oral QPC supper   Continuous Infusions:    Principal Problem:   Hip fracture, left Active Problems:   Hypothyroid   Borderline diabetes mellitus   CKD (chronic kidney disease), stage III   Metabolic acidosis, increased anion gap   Anemia of chronic disease    Time spent: [place], [place] [facility] Pager [phone]. If 7PM-7AM, please contact night-coverage at [email or web address], password [facility] 08/10/2013, 9:28 AM  LOS: 3 days

## 2013-08-10 NOTE — Progress Notes (Signed)
Clinical Social Work Department BRIEF PSYCHOSOCIAL ASSESSMENT 08/10/2013  Patient:  [name], [name]     Account Number:  [id number]     Admit date:  08/07/2013  Clinical Social Worker:  Rolinda Roan  Date/Time:  08/10/2013 06:28 PM  Referred by:  Physician  Date Referred:  08/10/2013 Referred for  SNF Placement   Other Referral:   Interview type:  Patient Other interview type:    PSYCHOSOCIAL DATA Living Status:  ALONE Admitted from facility:   Level of care:   Primary support name:  Mowgli Whetham [phone] Primary support relationship to patient:  CHILD, ADULT Degree of support available:   Very supportive.    CURRENT CONCERNS  Other Concerns:    SOCIAL WORK ASSESSMENT / PLAN Clinical Social Worker (CSW) met with patient to discuss short term rehab at a facility. Patient reported that CIR his is first choice and is agreeable for SNF as a back up. Patient lives in [place] and his [facility] SN[facility] choice is [name]. CSW explained to patient that CIR has to evaluate patient and his insurance has to approve the CIR stay. Patient verbalized his understanding. Patient gave CSW permission to fax out in [facility] excluding [facility]. Patient gave CSW permission to contact son Lennette Bihari. CSW spoke with Lennette Bihari who was agreeable to SNF as a back up and CIR as the first choice.   Assessment/plan status:  Psychosocial Support/Ongoing Assessment of Needs Other assessment/ plan:   Information/referral to community resources:   CSW gave patient SNF list.    PATIENT'S/FAMILY'S RESPONSE TO PLAN OF CARE: Patient and son were agreeable to SNF search in [facility] as a back up to [facility]

## 2013-08-10 NOTE — Progress Notes (Signed)
Physical Therapy Treatment Patient Details Name: [name]. MRN: [id number] DOB: Sep 05, 1931 Today's Date: 08/10/2013 Time: [id number] PT Time Calculation (min): 24 min  PT Assessment / Plan / Recommendation  History of Present Illness Pt. was admitted following a fall resulting in L femoral neck fx.  He also has anemia  of chronic disease, CKD III, s/p AAA repair in 2011.  He underwent screw fixation of his hip as well as bilateral knee steroid injections during surgery.   PT Comments   Pt states MD said he was allowed to put some weight on his L LE, however order is for TDWBing.  Please clarify if pt is TDWB or if he is allowed to be PWBing.    Follow Up Recommendations  CIR     Does the patient have the potential to tolerate intense rehabilitation     Barriers to Discharge        Equipment Recommendations  Rolling walker with 5" wheels    Recommendations for Other Services    Frequency Min 6X/week   Progress towards PT Goals Progress towards PT goals: Progressing toward goals  Plan Discharge plan needs to be updated    Precautions / Restrictions Precautions Precautions: Fall Restrictions Weight Bearing Restrictions: Yes LLE Weight Bearing: Touchdown weight bearing   Pertinent Vitals/Pain Indicates minimal pain.      Mobility  Bed Mobility Bed Mobility: Supine to Sit;Sitting - Scoot to Edge of Bed Supine to Sit: 5: Supervision;With rails Sitting - Scoot to Edge of Bed: 5: Supervision Transfers Transfers: Sit to Stand;Stand to Sit Sit to Stand: 4: Min assist;With upper extremity assist;From bed Stand to Sit: 4: Min assist;With upper extremity assist;To chair/3-in-1 Details for Transfer Assistance: cues for UE use and for pt to be TDWBing.   Ambulation/Gait Ambulation/Gait Assistance: 4: Min assist Ambulation Distance (Feet): 12 Feet Assistive device: Rolling walker Ambulation/Gait Assistance Details: cues for TDWBing and safety with RW.   Gait Pattern: Step-to  pattern;Decreased step length - right;Decreased step length - left Stairs: No Wheelchair Mobility Wheelchair Mobility: No    Exercises     PT Diagnosis:    PT Problem List:   PT Treatment Interventions:     PT Goals (current goals can now be found in the care plan section) Acute Rehab PT Goals Time For Goal Achievement: 08/16/13 Potential to Achieve Goals: Good  Visit Information  Last PT Received On: 08/10/13 Assistance Needed: +1 History of Present Illness: Pt. was admitted following a fall resulting in L femoral neck fx.  He also has anemia  of chronic disease, CKD III, s/p AAA repair in 2011.  He underwent screw fixation of his hip as well as bilateral knee steroid injections during surgery.    Subjective Data      Cognition  Cognition Arousal/Alertness: Awake/alert Behavior During Therapy: WFL for tasks assessed/performed Overall Cognitive Status: Impaired/Different from baseline Area of Impairment: Memory (likely some pre-existing memory issues) Memory: Decreased short-term memory;Decreased recall of precautions    Balance  Balance Balance Assessed: No  End of Session PT - End of Session Equipment Utilized During Treatment: Gait belt Activity Tolerance: Patient tolerated treatment well Patient left: in chair;with call bell/phone within reach Nurse Communication: Mobility status   GP     Catarina Hartshorn, [place] 08/10/2013, 2:59 PM

## 2013-08-10 NOTE — Progress Notes (Signed)
Patient remains with inability to void post foley removal yesterday after multiple attempts to bathroom and using urinal.  Bladder scan revealed >890cc and bladder distended.  In and out cath performed at 0200 obtaining 1452ml.  Met slight resistance during procedure and had small amount of blood returned initially, then urine became clear, yellow, with no odor.  Specimen collected and sent to lab for U/A.  Patient reports history of BPH and had prostate surgery in [place] where he had a catheter for 5 months.  He does not have a local urologist.  Continue to monitor for voiding and follow protocol accordingly.

## 2013-08-10 NOTE — Progress Notes (Signed)
Occupational Therapy Treatment Patient Details Name: [name]. MRN: [id number] DOB: 11/11/1931 Today's Date: 08/10/2013 Time: [id number] OT Time Calculation (min): 24 min  OT Assessment / Plan / Recommendation  History of present illness Pt. was admitted following a fall resulting in L femoral neck fx.  He also has anemia  of chronic disease, CKD III, s/p AAA repair in 2011.  He underwent screw fixation of his hip as well as bilateral knee steroid injections during surgery.   OT comments  Pt progressing toward goals.    Follow Up Recommendations  CIR    Barriers to Discharge       Equipment Recommendations  3 in 1 bedside comode    Recommendations for Other Services    Frequency Min 2X/week   Progress towards OT Goals Progress towards OT goals: Progressing toward goals  Plan Discharge plan needs to be updated    Precautions / Restrictions Precautions Precautions: Fall Restrictions Weight Bearing Restrictions: Yes LLE Weight Bearing: Touchdown weight bearing   Pertinent Vitals/Pain See vitals   ADL  Grooming: Performed;Wash/dry hands;Minimal assistance;Wash/dry face;Teeth care Where Assessed - Grooming: Supported standing Toilet Transfer: Pharmacologist Method: Sit to Loss adjuster, chartered:  (chair) Equipment Used: Gait belt;Rolling walker Transfers/Ambulation Related to ADLs: performed sit<>stand x2 at min assist level ADL Comments: Pt stood to use urinal with assist and cueing to TWB status.  Pt having difficulty maintaining TWB status so rolled pt's chair to sink for pt to perform grooming ADL at sink, still with assist to maintain TWB in standing.     OT Diagnosis:    OT Problem List:   OT Treatment Interventions:     OT Goals(current goals can now be found in the care plan section) Acute Rehab OT Goals Patient Stated Goal: To go home if I can't stay here for rehab " OT Goal Formulation: With patient Time For Goal  Achievement: 08/16/13 Potential to Achieve Goals: Good ADL Goals Pt Will Perform Grooming: with min assist;with mod assist;standing;with caregiver independent in assisting Pt Will Perform Lower Body Bathing: with max assist;with mod assist Pt Will Perform Lower Body Dressing: with max assist;with mod assist;with caregiver independent in assisting Pt Will Transfer to Toilet: with total assist;with max assist;bedside commode Pt Will Perform Toileting - Clothing Manipulation and hygiene: with mod assist;with max assist;with caregiver independent in assisting  Visit Information  Last OT Received On: 08/10/13 Assistance Needed: +1 History of Present Illness: Pt. was admitted following a fall resulting in L femoral neck fx.  He also has anemia  of chronic disease, CKD III, s/p AAA repair in 2011.  He underwent screw fixation of his hip as well as bilateral knee steroid injections during surgery.    Subjective Data      Prior Functioning       Cognition  Cognition Arousal/Alertness: Awake/alert Behavior During Therapy: WFL for tasks assessed/performed Overall Cognitive Status: Impaired/Different from baseline Area of Impairment: Memory Memory: Decreased short-term memory;Decreased recall of precautions    Mobility  Bed Mobility Bed Mobility: Supine to Sit;Sitting - Scoot to Edge of Bed Supine to Sit: 5: Supervision;With rails Sitting - Scoot to Edge of Bed: 5: Supervision Transfers Transfers: Stand to Sit;Sit to Stand Sit to Stand: 4: Min assist;From chair/3-in-1 Stand to Sit: 4: Min assist;To chair/3-in-1 Details for Transfer Assistance: cues for UE use and for pt to be TDWBing.      Exercises      Balance Balance Balance Assessed: No  End of Session OT - End of Session Equipment Utilized During Treatment: Gait belt;Rolling walker Activity Tolerance: Patient tolerated treatment well Patient left: in chair;with call bell/phone within reach  GO    08/10/2013 Darrol Jump OTR/L Pager [phone] Office [phone]  Darrol Jump 08/10/2013, 4:05 PM

## 2013-08-11 DIAGNOSIS — R339 Retention of urine, unspecified: Secondary | ICD-10-CM

## 2013-08-11 LAB — CBC
Hemoglobin: 9.1 g/dL — ABNORMAL LOW (ref 13.0–17.0)
MCHC: 34.7 g/dL (ref 30.0–36.0)
Platelets: 134 10*3/uL — ABNORMAL LOW (ref 150–400)
RDW: 14.8 % (ref 11.5–15.5)
WBC: 6.2 10*3/uL (ref 4.0–10.5)

## 2013-08-11 LAB — GLUCOSE, CAPILLARY
Glucose-Capillary: 207 mg/dL — ABNORMAL HIGH (ref 70–99)
Glucose-Capillary: 129 mg/dL — ABNORMAL HIGH (ref 70–99)
Glucose-Capillary: 271 mg/dL — ABNORMAL HIGH (ref 70–99)
Glucose-Capillary: 205 mg/dL — ABNORMAL HIGH (ref 70–99)

## 2013-08-11 NOTE — Progress Notes (Signed)
Orthopaedic Trauma Service (OTS)  Subjective: 3 Days Post-Op Procedure(s) (LRB): LEFT CANNULATED HIP PINNING (Left) STEROID INJECTION (Bilateral) knee Patient reports pain as mild.   Hoping for Rehab.  Objective: Current Vitals Blood pressure 176/69, pulse 73, temperature 98 F (36.7 C), temperature source Oral, resp. rate 18, height 5\' 10"  (1.778 m), weight 74.844 kg (165 lb), SpO2 96.00%. Vital signs in last 24 hours: Temp:  [98 F (36.7 C)-98.8 F (37.1 C)] 98 F (36.7 C) (09/21 0629) Pulse Rate:  [59-73] 73 (09/21 0629) Resp:  [18] 18 (09/21 0629) BP: (132-200)/(58-107) 176/69 mmHg (09/21 0629) SpO2:  [0 %-100 %] 96 % (09/21 0629)  Intake/Output from previous day: 09/20 0701 - 09/21 0700 In: 1923 [P.O.:1920; I.V.:3] Out: 4200 [Urine:4200]  LABS  Recent Labs  08/09/13 0540 08/10/13 0635 08/11/13 0540  HGB 9.4* 9.3* 9.1*    Recent Labs  08/10/13 0635 08/11/13 0540  WBC 7.8 6.2  RBC 2.91* 2.87*  HCT 26.4* 26.2*  PLT 107* 134*    Recent Labs  08/09/13 0540 08/10/13 0635  NA 135 137  K 4.7 4.3  CL 105 105  CO2 15* 16*  BUN 38* 56*  CREATININE 1.69* 1.70*  GLUCOSE 227* 159*  CALCIUM 7.2* 8.0*   No results found for this basename: LABPT, INR,  in the last 72 hours  Physical Exam Left hip dressing dry, no drainage No edema distally BLE Sens DPN, SPN, TN intact  Motor EHL, ext, flex, evers 5/5  DP 2+, PT 2+    Imaging No results found.  Assessment/Plan: 3 Days Post-Op Procedure(s) (LRB): LEFT CANNULATED HIP PINNING (Left) STEROID INJECTION (Bilateral) TOUCH DOWN WEIGHT BEARING LLE Rehab and SNF options pending  Altamese Greasy, MD [facility] [phone] (p)  08/11/2013, 8:50 AM

## 2013-08-11 NOTE — Progress Notes (Signed)
Per MD, foley cath to stay in d/t pt's inability to urinate on his own.  Pt had large BM this morning and has ambulated well.

## 2013-08-11 NOTE — Progress Notes (Signed)
I have seen and examined the patient. I agree with the findings above.  Rozanna Box, MD 8:56 AM

## 2013-08-11 NOTE — Progress Notes (Signed)
[facility] PROGRESS NOTE  [name]. [id number] DOB: Nov 08, 1931 DOA: 08/07/2013 PCP: Wyatt Haste, MD  Assessment/Plan: Constipation -suppository +BM  Urinary retention -add flomax - foley placed , will leave in and do voiding trial at later date -patient has been having trouble for years  Hip fracture, left/preop evaluation:  - s/p surgery -PT/OT- patient agreeable to SNF or CIR  Hypothyroid:  - Continue Synthroid.   Borderline diabetes mellitus:  - On no Medications at home check hemoglobin A1c we'll start him on sliding scale insulin once he is able to tolerate orals.  -carb modified diet -Hgb A1C 5.5  CKD (chronic kidney disease), stage III:  - At baseline continue bicarbonate tablets.   Anemia of chronic disease  - At baseline.  Code Status: full Family Communication: patient Disposition Plan: await PT  Consultants:  ortho  Procedures: LEFT CANNULATED HIP PINNING (Left)  STEROID INJECTION (Bilateral) knees   Antibiotics:    HPI/Subjective: Doing better today Agreeable to SNF now  Objective: Filed Vitals:   08/11/13 0629  BP: 176/69  Pulse: 73  Temp: 98 F (36.7 C)  Resp: 18    Intake/Output Summary (Last 24 hours) at 08/11/13 1109 Last data filed at 08/11/13 0631  Gross per 24 hour  Intake   1563 ml  Output   2750 ml  Net  -1187 ml   Filed Weights   08/07/13 0302  Weight: 74.844 kg (165 lb)    Exam:   General:  sleepy  Cardiovascular: rrr  Respiratory: clear   Abdomen: +BS, soft  Musculoskeletal: no edema   Data Reviewed: Basic Metabolic Panel:  Recent Labs Lab 08/07/13 0429 08/07/13 1415 08/08/13 0533 08/09/13 0540 08/10/13 0635  NA 140  --  134* 135 137  K 4.2  --  4.1 4.7 4.3  CL 109  --  107 105 105  CO2 16*  --  13* 15* 16*  GLUCOSE 122*  --  104* 227* 159*  BUN 37*  --  38* 38* 56*  CREATININE 1.80* 1.81* 1.92* 1.69* 1.70*  CALCIUM 7.9*  --  7.4* 7.2* 8.0*   Liver Function  Tests:  Recent Labs Lab 08/08/13 0533  AST 11  ALT 12  ALKPHOS 81  BILITOT 0.3  PROT 5.7*  ALBUMIN 2.9*   No results found for this basename: LIPASE, AMYLASE,  in the last 168 hours No results found for this basename: AMMONIA,  in the last 168 hours CBC:  Recent Labs Lab 08/07/13 0429 08/07/13 1415 08/08/13 0533 08/09/13 0540 08/10/13 0635 08/11/13 0540  WBC 6.0 5.3 4.9 7.8 7.8 6.2  NEUTROABS 4.4  --   --   --   --   --   HGB 9.3* 9.2* 9.3* 9.4* 9.3* 9.1*  HCT 28.1* 26.7* 26.9* 26.8* 26.4* 26.2*  MCV 96.2 92.7 93.1 91.5 90.7 91.3  PLT 93* 97* 94* 90* 107* 134*   Cardiac Enzymes: No results found for this basename: CKTOTAL, CKMB, CKMBINDEX, TROPONINI,  in the last 168 hours BNP (last 3 results) No results found for this basename: PROBNP,  in the last 8760 hours CBG:  Recent Labs Lab 08/10/13 0816 08/10/13 1129 08/10/13 1644 08/10/13 2114 08/11/13 0631  GLUCAP 192* 114* 94 207* 129*    Recent Results (from the past 240 hour(s))  SURGICAL PCR SCREEN     Status: None   Collection Time    08/07/13  4:22 PM      Result Value Range Status   MRSA, PCR  NEGATIVE  NEGATIVE Final   Staphylococcus aureus NEGATIVE  NEGATIVE Final   Comment:            The Xpert SA Assay (FDA     approved for NASAL specimens     in patients over 58 years of age),     is one component of     a comprehensive surveillance     program.  Test performance has     been validated by [employer] for patients greater     than or equal to 26 year old.     It is not intended     to diagnose infection nor to     guide or monitor treatment.     Studies: No results found.  Scheduled Meds: . calcitRIOL  0.25 mcg Oral Daily  . calcium carbonate  1 tablet Oral TID WC  . enoxaparin (LOVENOX) injection  40 mg Subcutaneous Q24H  . insulin aspart  0-9 Units Subcutaneous TID WC  . insulin aspart  3 Units Subcutaneous TID WC  . levothyroxine  88 mcg Oral QAC breakfast  . polyethylene  glycol  17 g Oral Daily  . sodium bicarbonate  1,300 mg Oral BID  . sodium chloride  3 mL Intravenous Q12H  . tamsulosin  0.4 mg Oral QPC supper   Continuous Infusions:    Principal Problem:   Hip fracture, left Active Problems:   Hypothyroid   BPH (benign prostatic hyperplasia)   Borderline diabetes mellitus   CKD (chronic kidney disease), stage III   Metabolic acidosis, increased anion gap   Anemia of chronic disease   Urinary retention   Acute blood loss anemia    Time spent: [place], [facility] Pager [phone]. If 7PM-7AM, please contact night-coverage at [email or web address], password [facility] 08/11/2013, 11:09 AM  LOS: 4 days

## 2013-08-12 ENCOUNTER — Encounter (HOSPITAL_COMMUNITY): Payer: Self-pay | Admitting: Orthopedic Surgery

## 2013-08-12 LAB — CBC
HCT: 24.4 % — ABNORMAL LOW (ref 39.0–52.0)
Hemoglobin: 8.5 g/dL — ABNORMAL LOW (ref 13.0–17.0)
MCH: 31.7 pg (ref 26.0–34.0)
MCHC: 34.8 g/dL (ref 30.0–36.0)
MCV: 91 fL (ref 78.0–100.0)
RBC: 2.68 MIL/uL — ABNORMAL LOW (ref 4.22–5.81)
RDW: 14.7 % (ref 11.5–15.5)

## 2013-08-12 LAB — BASIC METABOLIC PANEL
BUN: 54 mg/dL — ABNORMAL HIGH (ref 6–23)
CO2: 19 mEq/L (ref 19–32)
Calcium: 7.7 mg/dL — ABNORMAL LOW (ref 8.4–10.5)
Glucose, Bld: 128 mg/dL — ABNORMAL HIGH (ref 70–99)
Potassium: 4 mEq/L (ref 3.5–5.1)
Sodium: 135 mEq/L (ref 135–145)

## 2013-08-12 LAB — GLUCOSE, CAPILLARY
Glucose-Capillary: 125 mg/dL — ABNORMAL HIGH (ref 70–99)
Glucose-Capillary: 102 mg/dL — ABNORMAL HIGH (ref 70–99)

## 2013-08-12 LAB — TYPE AND SCREEN
ABO/RH(D): O POS
Antibody Screen: NEGATIVE
Unit division: 0

## 2013-08-12 MED ORDER — AMLODIPINE BESYLATE 5 MG PO TABS: 5.0000 mg | ORAL_TABLET | Freq: Every day | ORAL | Status: DC

## 2013-08-12 MED ORDER — INSULIN ASPART 100 UNIT/ML ~~LOC~~ SOLN: 0.0000 [IU] | Freq: Three times a day (TID) | SUBCUTANEOUS | Status: DC

## 2013-08-12 MED ORDER — GLYCERIN (LAXATIVE) 2.1 G RE SUPP: 1.0000 | Freq: Every day | RECTAL | Status: DC | PRN

## 2013-08-12 MED ORDER — INSULIN ASPART 100 UNIT/ML ~~LOC~~ SOLN: 3.0000 [IU] | Freq: Three times a day (TID) | SUBCUTANEOUS | Status: DC

## 2013-08-12 MED ORDER — AMLODIPINE BESYLATE 5 MG PO TABS
5.0000 mg | ORAL_TABLET | Freq: Every day | ORAL | Status: DC
  Filled 2013-08-12: qty 1

## 2013-08-12 MED ORDER — TAMSULOSIN HCL 0.4 MG PO CAPS: 0.4000 mg | ORAL_CAPSULE | Freq: Every day | ORAL | Status: DC

## 2013-08-12 NOTE — Progress Notes (Signed)
Rehab Admissions Coordinator Note:  Patient was screened by Theora Master for appropriateness for an Inpatient Acute Rehab Consult.  At this time, pt already has order for an Inpatient Rehab consult.  Lutricia Feil S 08/12/2013, 9:00 AM  I can be reached at [phone].

## 2013-08-12 NOTE — Progress Notes (Signed)
[employer] will not approve an inpt rehab admission for this diagnosis at this time. I discussed with pt and he is aware and upset. I have alerted RN [place] and SW. [phone]

## 2013-08-12 NOTE — Discharge Summary (Signed)
Physician Discharge Summary  [name]. [id number] DOB: 1931/05/23 DOA: 08/07/2013  PCP: Wyatt Haste, MD  Admit date: 08/07/2013 Discharge date: 08/12/2013  Time spent: 35 minutes  Recommendations for Outpatient Follow-up:   Discharge Diagnoses:  Principal Problem:   Hip fracture, left Active Problems:   Hypothyroid   BPH (benign prostatic hyperplasia)   Borderline diabetes mellitus   CKD (chronic kidney disease), stage III   Metabolic acidosis, increased anion gap   Anemia of chronic disease   Urinary retention   Acute blood loss anemia   Discharge Condition:  improved  Diet recommendation: cardiac/diabetic  Filed Weights   08/07/13 0302  Weight: 74.844 kg (165 lb)    History of present illness:  79 year old male with past medical history of chronic renal disease stage III, status post abdominal aneurysm repair in 2011 that comes in for a fall and left hip pain on the day of admission. He relates he was walking to the bathroom when he tripped on his feet and felt on his left hip, since then he's been having pain and spasms with movement of that leg. He called EMS brought him to the ED. He denies any chest pain, abdominal pain, shortness of breath, he did not hit his head, he relates no loss of consciousness.  In the ED:  Basic metabolic panel was done that shows creatinine at baseline with a mild anion gap and bicarbonate 16, no leukocytosis and mild anemia at baseline, x-rays showed nondisplaced left femoral neck fracture.   Hospital Course:  Constipation  -suppository  +BM   Urinary retention  -add flomax  - foley placed , will leave in and do voiding trial at later date  -patient has been having trouble for years   Hip fracture, left/preop evaluation:  - s/p surgery  -SNF  Hypothyroid:  - Continue Synthroid.   Borderline diabetes mellitus:  - On no Medications at home check hemoglobin A1c we'll start him on sliding scale insulin once he is  able to tolerate orals.  -carb modified diet  -Hgb A1C 5.5   CKD (chronic kidney disease), stage III:  - At baseline continue bicarbonate tablets.   Anemia of chronic disease  - mild ABLA  -Hgb slightly below baseline   Procedures: LEFT CANNULATED HIP PINNING (Left)  STEROID INJECTION (Bilateral) knees   Consultations:  ortho  Discharge Exam: Filed Vitals:   08/12/13 1121  BP:   Pulse:   Temp:   Resp: 18     Discharge Instructions      Discharge Orders   Future Appointments Provider Department Dept Phone   08/30/2013 9:15 AM Hayden Pedro, MD [place] [phone]   09/30/2013 8:30 AM [facility] [phone]   Eat a light meal the night before the exam but please avoid gaseous foods.   Nothing to eat or drink for at least 8 hours prior to the exam. No gum chewing or smoking the morning of the exam. Please take your morning medications with small sips of water, especially blood pressure medication. If you have several vascular lab exams and will see physician, please bring a snack with you.   09/30/2013 9:00 AM Viann Fish, NP [facility] [phone]   Future Orders Complete By Expires   Diet - low sodium heart healthy  As directed    Diet Carb Modified  As directed    Discharge instructions  As directed  Comments:     Voiding trial in future Titrate BP medications Check blood sugar- titrate insulin up CBC 1 week   Increase activity slowly  As directed    Touch down weight bearing  As directed        Medication List    STOP taking these medications       loperamide 2 MG capsule  Commonly known as:  IMODIUM      TAKE these medications       acetaminophen 500 MG tablet  Commonly known as:  TYLENOL  Take 1-2 tablets (500-1,000 mg total) by mouth every 6 (six) hours as needed.     amLODipine 5 MG tablet  Commonly known as:  NORVASC  Take 1  tablet (5 mg total) by mouth daily.     enoxaparin 40 MG/0.4ML injection  Commonly known as:  LOVENOX  Inject 0.4 mLs (40 mg total) into the skin daily.     Glycerin (Adult) 2.1 G Supp  Place 1 suppository rectally daily as needed.     HYDROcodone-acetaminophen 5-325 MG per tablet  Commonly known as:  NORCO/VICODIN  Take 1-2 tablets by mouth every 4 (four) hours as needed.     insulin aspart 100 UNIT/ML injection  Commonly known as:  novoLOG  Inject 0-9 Units into the skin 3 (three) times daily with meals.     insulin aspart 100 UNIT/ML injection  Commonly known as:  novoLOG  Inject 3 Units into the skin 3 (three) times daily with meals.     levothyroxine 88 MCG tablet  Commonly known as:  SYNTHROID, LEVOTHROID  Take 88 mcg by mouth every morning.     tamsulosin 0.4 MG Caps capsule  Commonly known as:  FLOMAX  Take 1 capsule (0.4 mg total) by mouth daily after supper.       Allergies  Allergen Reactions  . Aspirin    Follow-up Information   Follow up with HANDY,MICHAEL H, MD. Schedule an appointment as soon as possible for a visit in 2 weeks. (call for appointment )    Specialty:  Orthopedic Surgery   Contact information:   [address], [place] [address] [phone]       Follow up with Wyatt Haste, MD In 1 month.   Specialty:  Family Medicine   Contact information:   [place]  [id number] [phone]        The results of significant diagnostics from this hospitalization (including imaging, microbiology, ancillary and laboratory) are listed below for reference.    Significant Diagnostic Studies: Dg Chest 1 View  08/07/2013   CLINICAL DATA:  Trauma with left chest pain.  EXAM: CHEST - 1 VIEW  COMPARISON:  05/30/2010.  FINDINGS: Normal heart size. Aortic atherosclerosis. No infiltrate or edema. No effusion. No pneumothorax (edge over the lower right chest is likely a skin fold.   IMPRESSION: No active disease.   Electronically Signed   By: Jorje Guild   On: 08/07/2013 04:19   Dg Pelvis 1-2 Views  08/07/2013   *RADIOLOGY REPORT*  Clinical Data: Left hip pain, fall  PELVIS - 1-2 VIEW  Comparison: None available  Findings: There is a subtle acute nondisplaced fracture through the left femoral neck.  The left femoral head remains normally located within the acetabulum.  Femoral head height is preserved.  There is no pubic diastasis.  Limited views of the right hip grossly normal. SI joints are approximated.  IMPRESSION: Acute nondisplaced  fracture of the left femoral neck.   Original Report Authenticated By: Jeannine Boga, M.D.   Dg Hip Operative Left  08/08/2013   CLINICAL DATA:  Cannulated pinning of left hip.  EXAM: OPERATIVE LEFT HIP  COMPARISON:  Plain films 08/07/2013  FINDINGS: Multiple intraoperative spot images demonstrate placement of for screws across the left femoral neck fracture. No complicating features. Normal alignment. .  IMPRESSION: Internal fixation of the left femoral neck fracture.   Electronically Signed   By: Rolm Baptise M.D.   On: 08/08/2013 10:40   Dg Femur Left  08/07/2013   *RADIOLOGY REPORT*  Clinical Data: Fall, left hip pain  LEFT FEMUR - 2 VIEW  Comparison: None.  Findings: There is an acute nondisplaced fracture through the left femoral neck.  Femoral head remains in normal alignment with the acetabulum.  The femur is otherwise intact.  Degenerative osteoarthrosis seen about the left knee.  Vascular calcifications are noted within the left side.  IMPRESSION: Acute nondisplaced fracture of the left femoral neck.   Original Report Authenticated By: Jeannine Boga, M.D.   Ct Head Wo Contrast  08/07/2013   *RADIOLOGY REPORT*  Clinical Data: Fall, dizziness  CT HEAD WITHOUT CONTRAST  Technique:  Contiguous axial images were obtained from the base of the skull through the vertex without contrast.  Comparison: Prior CT from 05/27/2010 no   Findings: Moderate diffuse age related atrophy is present. Scattered and confluent hypodensity within the periventricular white matter is consistent with chronic small vessel ischemic changes.  No acute intracranial hemorrhage or infarct.  No mass or midline shift.  No extra-axial fluid collection.  Calvarium is intact.  Scalp soft tissues are normal.  Orbital soft tissues are within normal limits.  Paranasal sinuses and mastoid air cells are clear.  IMPRESSION: Moderate age related atrophy with chronic microvascular ischemic disease.  No acute intracranial abscess identified.   Original Report Authenticated By: Jeannine Boga, M.D.   Dg Pelvis Portable  08/08/2013   CLINICAL DATA:  Fracture fixation  EXAM: PORTABLE PELVIS  COMPARISON:  08/07/2013  FINDINGS: Three pins are present across the left femoral neck fracture. Fracture in satisfactory alignment. Pins are a the in satisfactory position. No other fracture  IMPRESSION: Satisfactory left hip pinning   Electronically Signed   By: Franchot Gallo M.D.   On: 08/08/2013 11:44   Dg Knee Left Port  08/07/2013   CLINICAL DATA:  Left femoral neck fracture.  EXAM: PORTABLE LEFT KNEE - 1-2 VIEW  COMPARISON:  None.  FINDINGS: Study is limited given the proximal femoral fracture. No acute bony abnormality. No fracture, subluxation or dislocation visualized. No significant joint effusion.  IMPRESSION: Limited study. No acute findings.   Electronically Signed   By: Rolm Baptise M.D.   On: 08/07/2013 10:49   Dg Knee Right Port  08/07/2013   CLINICAL DATA:  Pain and in both legs.  EXAM: PORTABLE RIGHT KNEE - 1-2 VIEW  COMPARISON:  Knee film 08/05/2008  FINDINGS: There has been interval progression of joint space narrowing of the medial and lateral compartment of the right knee. No evidence of acute fracture. No joint effusion.  IMPRESSION: Advanced loss of joint space in the medial and lateral compartment is likely related to osteoarthritis. No acute findings.    Electronically Signed   By: Suzy Bouchard M.D.   On: 08/07/2013 10:21    Microbiology: Recent Results (from the past 240 hour(s))  SURGICAL PCR SCREEN     Status: None   Collection  Time    08/07/13  4:22 PM      Result Value Range Status   MRSA, PCR NEGATIVE  NEGATIVE Final   Staphylococcus aureus NEGATIVE  NEGATIVE Final   Comment:            The Xpert SA Assay (FDA     approved for NASAL specimens     in patients over 51 years of age),     is one component of     a comprehensive surveillance     program.  Test performance has     been validated by [employer] for patients greater     than or equal to 74 year old.     It is not intended     to diagnose infection nor to     guide or monitor treatment.     Labs: Basic Metabolic Panel:  Recent Labs Lab 08/07/13 0429 08/07/13 1415 08/08/13 0533 08/09/13 0540 08/10/13 0635 08/12/13 0525  NA 140  --  134* 135 137 135  K 4.2  --  4.1 4.7 4.3 4.0  CL 109  --  107 105 105 104  CO2 16*  --  13* 15* 16* 19  GLUCOSE 122*  --  104* 227* 159* 128*  BUN 37*  --  38* 38* 56* 54*  CREATININE 1.80* 1.81* 1.92* 1.69* 1.70* 1.69*  CALCIUM 7.9*  --  7.4* 7.2* 8.0* 7.7*   Liver Function Tests:  Recent Labs Lab 08/08/13 0533  AST 11  ALT 12  ALKPHOS 81  BILITOT 0.3  PROT 5.7*  ALBUMIN 2.9*   No results found for this basename: LIPASE, AMYLASE,  in the last 168 hours No results found for this basename: AMMONIA,  in the last 168 hours CBC:  Recent Labs Lab 08/07/13 0429  08/08/13 0533 08/09/13 0540 08/10/13 0635 08/11/13 0540 08/12/13 0525  WBC 6.0  < > 4.9 7.8 7.8 6.2 4.8  NEUTROABS 4.4  --   --   --   --   --   --   HGB 9.3*  < > 9.3* 9.4* 9.3* 9.1* 8.5*  HCT 28.1*  < > 26.9* 26.8* 26.4* 26.2* 24.4*  MCV 96.2  < > 93.1 91.5 90.7 91.3 91.0  PLT 93*  < > 94* 90* 107* 134* 125*  < > = values in this interval not displayed. Cardiac Enzymes: No results found for this basename: CKTOTAL, CKMB, CKMBINDEX,  TROPONINI,  in the last 168 hours BNP: BNP (last 3 results) No results found for this basename: PROBNP,  in the last 8760 hours CBG:  Recent Labs Lab 08/11/13 1115 08/11/13 1618 08/11/13 2226 08/12/13 0722 08/12/13 1053  GLUCAP 271* 134* 205* 125* 102*       Signed:  Cambren Helm  [facility] 08/12/2013, 2:27 PM

## 2013-08-12 NOTE — Progress Notes (Signed)
Physical Therapy Treatment Patient Details Name: [name]. MRN: [id number] DOB: 1930-12-31 Today's Date: 08/12/2013 Time: [id number] PT Time Calculation (min): 24 min  PT Assessment / Plan / Recommendation  History of Present Illness Pt. was admitted following a fall resulting in L femoral neck fx.  He also has anemia  of chronic disease, CKD III, s/p AAA repair in 2011.  He underwent screw fixation of his hip as well as bilateral knee steroid injections during surgery.   PT Comments   Patient able to ambulate further this session. Patient is TDWBing and requiring cues to maintain. Patient states doctor told him that "i could put in on the ground". Patient found out that he was denied CIR and is fairly upset. Stated he refused to pay or go to SNF. Educated patient on why SNF was the best option at this time and he is open to talking with CSW. Case Manager aware of the situation. Will follow up as appropriate  Follow Up Recommendations  SNF     Does the patient have the potential to tolerate intense rehabilitation     Barriers to Discharge        Equipment Recommendations  Rolling walker with 5" wheels    Recommendations for Other Services    Frequency Min 6X/week   Progress towards PT Goals Progress towards PT goals: Progressing toward goals  Plan Discharge plan needs to be updated    Precautions / Restrictions Precautions Precautions: Fall Restrictions Weight Bearing Restrictions: Yes LLE Weight Bearing: Touchdown weight bearing   Pertinent Vitals/Pain 3/10 L hip patient repositioned for comfort     Mobility  Bed Mobility Bed Mobility: Not assessed Transfers Sit to Stand: 4: Min assist;From chair/3-in-1 Stand to Sit: 4: Min assist;To chair/3-in-1 Details for Transfer Assistance: cues for UE use and for pt to be TDWBing.   Ambulation/Gait Ambulation/Gait Assistance: 4: Min assist Ambulation Distance (Feet): 150 Feet Assistive device: Rolling  walker Ambulation/Gait Assistance Details: Cues for TDWBing status and RW positioning Gait Pattern: Step-to pattern;Decreased step length - right;Decreased step length - left Gait velocity: decreased    Exercises     PT Diagnosis:    PT Problem List:   PT Treatment Interventions:     PT Goals (current goals can now be found in the care plan section)    Visit Information  Last PT Received On: 08/12/13 Assistance Needed: +1 History of Present Illness: Pt. was admitted following a fall resulting in L femoral neck fx.  He also has anemia  of chronic disease, CKD III, s/p AAA repair in 2011.  He underwent screw fixation of his hip as well as bilateral knee steroid injections during surgery.    Subjective Data      Cognition  Cognition Arousal/Alertness: Awake/alert Behavior During Therapy: WFL for tasks assessed/performed Overall Cognitive Status: Impaired/Different from baseline Area of Impairment: Memory Memory: Decreased short-term memory;Decreased recall of precautions    Balance     End of Session PT - End of Session Equipment Utilized During Treatment: Gait belt Activity Tolerance: Patient tolerated treatment well Patient left: in chair;with call bell/phone within reach Nurse Communication: Mobility status   GP     Jacqualyn Posey 08/12/2013, 11:52 AM  08/12/2013 Jacqualyn Posey PTA [phone] pager [phone] office

## 2013-08-12 NOTE — Progress Notes (Addendum)
[facility] PROGRESS NOTE  [name]. [id number] DOB: [birth year] DOA: 08/07/2013 PCP: Wyatt Haste, MD   79 year old male with past medical history of chronic renal disease stage III, status post abdominal aneurysm repair in 2011 that comes in for a fall and left hip pain on the day of admission. He relates he was walking to the bathroom when he tripped on his feet and felt on his left hip, since then he's been having pain and spasms with movement of that leg. He called EMS brought him to the ED. He denies any chest pain, abdominal pain, shortness of breath, he did not hit his head, he relates no loss of consciousness.  Awaiting placement  Assessment/Plan: Constipation -suppository +BM  Urinary retention -add flomax - foley placed , will leave in and do voiding trial at later date -patient has been having trouble for years  Hip fracture, left/preop evaluation:  - s/p surgery -PT/OT- patient agreeable to SNF - CIR not paid by insurance  Hypothyroid:  - Continue Synthroid.   Borderline diabetes mellitus:  - On no Medications at home check hemoglobin A1c we'll start him on sliding scale insulin once he is able to tolerate orals.  -carb modified diet -Hgb A1C 5.5  CKD (chronic kidney disease), stage III:  - At baseline continue bicarbonate tablets.   Anemia of chronic disease  - mild ABLA -Hgb slightly below baseline  Code Status: full Family Communication: patient Disposition Plan: SNF  Consultants:  ortho  Procedures: LEFT CANNULATED HIP PINNING (Left)  STEROID INJECTION (Bilateral) knees   Antibiotics:    HPI/Subjective: Upset about CIR -still agreeable about SNF- has a lot of questions about payment  Objective: Filed Vitals:   08/12/13 0800  BP:   Pulse:   Temp:   Resp: 18    Intake/Output Summary (Last 24 hours) at 08/12/13 1038 Last data filed at 08/12/13 0721  Gross per 24 hour  Intake    723 ml  Output   1450 ml  Net    -727 ml   Filed Weights   08/07/13 0302  Weight: 74.844 kg (165 lb)    Exam:   General:  awake  Cardiovascular: rrr  Respiratory: clear   Abdomen: +BS, soft  Musculoskeletal: no edema   Data Reviewed: Basic Metabolic Panel:  Recent Labs Lab 08/07/13 0429 08/07/13 1415 08/08/13 0533 08/09/13 0540 08/10/13 0635 08/12/13 0525  NA 140  --  134* 135 137 135  K 4.2  --  4.1 4.7 4.3 4.0  CL 109  --  107 105 105 104  CO2 16*  --  13* 15* 16* 19  GLUCOSE 122*  --  104* 227* 159* 128*  BUN 37*  --  38* 38* 56* 54*  CREATININE 1.80* 1.81* 1.92* 1.69* 1.70* 1.69*  CALCIUM 7.9*  --  7.4* 7.2* 8.0* 7.7*   Liver Function Tests:  Recent Labs Lab 08/08/13 0533  AST 11  ALT 12  ALKPHOS 81  BILITOT 0.3  PROT 5.7*  ALBUMIN 2.9*   No results found for this basename: LIPASE, AMYLASE,  in the last 168 hours No results found for this basename: AMMONIA,  in the last 168 hours CBC:  Recent Labs Lab 08/07/13 0429  08/08/13 0533 08/09/13 0540 08/10/13 0635 08/11/13 0540 08/12/13 0525  WBC 6.0  < > 4.9 7.8 7.8 6.2 4.8  NEUTROABS 4.4  --   --   --   --   --   --   HGB 9.3*  < >  9.3* 9.4* 9.3* 9.1* 8.5*  HCT 28.1*  < > 26.9* 26.8* 26.4* 26.2* 24.4*  MCV 96.2  < > 93.1 91.5 90.7 91.3 91.0  PLT 93*  < > 94* 90* 107* 134* 125*  < > = values in this interval not displayed. Cardiac Enzymes: No results found for this basename: CKTOTAL, CKMB, CKMBINDEX, TROPONINI,  in the last 168 hours BNP (last 3 results) No results found for this basename: PROBNP,  in the last 8760 hours CBG:  Recent Labs Lab 08/11/13 0631 08/11/13 1115 08/11/13 1618 08/11/13 2226 08/12/13 0722  GLUCAP 129* 271* 134* 205* 125*    Recent Results (from the past 240 hour(s))  SURGICAL PCR SCREEN     Status: None   Collection Time    08/07/13  4:22 PM      Result Value Range Status   MRSA, PCR NEGATIVE  NEGATIVE Final   Staphylococcus aureus NEGATIVE  NEGATIVE Final   Comment:            The  Xpert SA Assay (FDA     approved for NASAL specimens     in patients over 49 years of age),     is one component of     a comprehensive surveillance     program.  Test performance has     been validated by [employer] for patients greater     than or equal to 59 year old.     It is not intended     to diagnose infection nor to     guide or monitor treatment.     Studies: No results found.  Scheduled Meds: . calcitRIOL  0.25 mcg Oral Daily  . calcium carbonate  1 tablet Oral TID WC  . enoxaparin (LOVENOX) injection  40 mg Subcutaneous Q24H  . insulin aspart  0-9 Units Subcutaneous TID WC  . insulin aspart  3 Units Subcutaneous TID WC  . levothyroxine  88 mcg Oral QAC breakfast  . polyethylene glycol  17 g Oral Daily  . sodium bicarbonate  1,300 mg Oral BID  . sodium chloride  3 mL Intravenous Q12H  . tamsulosin  0.4 mg Oral QPC supper   Continuous Infusions:    Principal Problem:   Hip fracture, left Active Problems:   Hypothyroid   BPH (benign prostatic hyperplasia)   Borderline diabetes mellitus   CKD (chronic kidney disease), stage III   Metabolic acidosis, increased anion gap   Anemia of chronic disease   Urinary retention   Acute blood loss anemia    Time spent: [place], [facility] Pager [phone]. If 7PM-7AM, please contact night-coverage at [email or web address], password [facility] 08/12/2013, 10:38 AM  LOS: 5 days

## 2013-08-12 NOTE — Progress Notes (Signed)
Clinical social worker assisted with patient discharge to skilled nursing facility, [name].  CSW addressed all family questions and concerns. CSW copied chart and added all important documents. CSW also set up patient transportation with Diplomatic [employer] operational officer. Clinical Social Worker will sign off for now as social work intervention is no longer needed.   Rhea Pink, MSW, [place]

## 2013-08-12 NOTE — Progress Notes (Signed)
Agree with change in discharge disposition. [name], [place], [place]

## 2013-08-29 ENCOUNTER — Encounter (INDEPENDENT_AMBULATORY_CARE_PROVIDER_SITE_OTHER): Payer: Medicare Other | Admitting: Ophthalmology

## 2013-08-29 DIAGNOSIS — H43819 Vitreous degeneration, unspecified eye: Secondary | ICD-10-CM

## 2013-08-29 DIAGNOSIS — H35039 Hypertensive retinopathy, unspecified eye: Secondary | ICD-10-CM

## 2013-08-29 DIAGNOSIS — H2510 Age-related nuclear cataract, unspecified eye: Secondary | ICD-10-CM

## 2013-08-29 DIAGNOSIS — H3530 Unspecified macular degeneration: Secondary | ICD-10-CM

## 2013-08-29 DIAGNOSIS — I10 Essential (primary) hypertension: Secondary | ICD-10-CM

## 2013-08-29 DIAGNOSIS — H353290 Exudative age-related macular degeneration, unspecified eye, stage unspecified: Secondary | ICD-10-CM

## 2013-08-30 ENCOUNTER — Encounter (INDEPENDENT_AMBULATORY_CARE_PROVIDER_SITE_OTHER): Payer: Medicare Other | Admitting: Ophthalmology

## 2013-09-04 ENCOUNTER — Telehealth: Payer: Self-pay

## 2013-09-04 NOTE — Telephone Encounter (Signed)
Called [name] back to let him know okay left message ok for therapy

## 2013-09-04 NOTE — Telephone Encounter (Signed)
Go ahead and set this up

## 2013-09-04 NOTE — Telephone Encounter (Signed)
DON FROM [facility] CALLED AND ASKED FOR VERBAL OK FOR OCCUPATIONAL THERAPY 2 X 1 WK AND 1 X 2 WK'S  PLEASE ADVISE

## 2013-09-05 ENCOUNTER — Telehealth: Payer: Self-pay | Admitting: Family Medicine

## 2013-09-05 ENCOUNTER — Ambulatory Visit (INDEPENDENT_AMBULATORY_CARE_PROVIDER_SITE_OTHER): Payer: Medicare Other | Admitting: Family Medicine

## 2013-09-05 VITALS — BP 130/60

## 2013-09-05 DIAGNOSIS — D638 Anemia in other chronic diseases classified elsewhere: Secondary | ICD-10-CM

## 2013-09-05 DIAGNOSIS — R252 Cramp and spasm: Secondary | ICD-10-CM

## 2013-09-05 DIAGNOSIS — S72142S Displaced intertrochanteric fracture of left femur, sequela: Secondary | ICD-10-CM

## 2013-09-05 DIAGNOSIS — Z79899 Other long term (current) drug therapy: Secondary | ICD-10-CM

## 2013-09-05 DIAGNOSIS — S72009S Fracture of unspecified part of neck of unspecified femur, sequela: Secondary | ICD-10-CM

## 2013-09-05 DIAGNOSIS — E559 Vitamin D deficiency, unspecified: Secondary | ICD-10-CM

## 2013-09-05 LAB — CBC WITH DIFFERENTIAL/PLATELET
Basophils Absolute: 0 10*3/uL (ref 0.0–0.1)
Eosinophils Absolute: 0.1 10*3/uL (ref 0.0–0.7)
Eosinophils Relative: 2 % (ref 0–5)
HCT: 30.3 % — ABNORMAL LOW (ref 39.0–52.0)
Hemoglobin: 10.1 g/dL — ABNORMAL LOW (ref 13.0–17.0)
Lymphocytes Relative: 21 % (ref 12–46)
Lymphs Abs: 1.2 10*3/uL (ref 0.7–4.0)
MCH: 31.4 pg (ref 26.0–34.0)
MCV: 94.1 fL (ref 78.0–100.0)
Monocytes Absolute: 0.4 10*3/uL (ref 0.1–1.0)
Platelets: 148 10*3/uL — ABNORMAL LOW (ref 150–400)
RBC: 3.22 MIL/uL — ABNORMAL LOW (ref 4.22–5.81)
WBC: 5.4 10*3/uL (ref 4.0–10.5)

## 2013-09-05 LAB — COMPREHENSIVE METABOLIC PANEL
ALT: 17 U/L (ref 0–53)
AST: 17 U/L (ref 0–37)
Albumin: 4 g/dL (ref 3.5–5.2)
Alkaline Phosphatase: 171 U/L — ABNORMAL HIGH (ref 39–117)
CO2: 17 mEq/L — ABNORMAL LOW (ref 19–32)
Calcium: 8 mg/dL — ABNORMAL LOW (ref 8.4–10.5)
Chloride: 109 mEq/L (ref 96–112)
Creat: 1.76 mg/dL — ABNORMAL HIGH (ref 0.50–1.35)

## 2013-09-05 MED ORDER — TIZANIDINE HCL 4 MG PO TABS: 4.0000 mg | ORAL_TABLET | Freq: Four times a day (QID) | ORAL | Status: DC | PRN

## 2013-09-05 MED ORDER — CYCLOBENZAPRINE HCL 10 MG PO TABS: 10.0000 mg | ORAL_TABLET | Freq: Three times a day (TID) | ORAL | Status: DC | PRN

## 2013-09-05 NOTE — Progress Notes (Signed)
  Subjective:    Patient ID: [name]., male    DOB: 03-15-1931, 79 y.o.   MRN: [id number]  HPI He is here for a followup visit after having surgical repair of left hip fracture. He was in rehabilitation for 2 weeks and has been out on his own starting October 7. While in the rehabilitation unit he did have blood work done which did show a hemoglobin of 9.2 BUN 44 creatinine 1.7, magnesium 1.5, B12 of 275 and vitamin D level of 17 . He was given vitamin B12 injections. His medications were reviewed and are in the chart. He does complain of muscle spasms. He does need a refill on his muscle relaxer and pain medication. He has a previous history of CAD as well as low calcium and vitamin D level. He does have home health helping him.  Review of Systems     Objective:   Physical Exam Alert and in no distress otherwise not examined       Assessment & Plan:  Anemia of chronic disease - Plan: CBC with Differential  Encounter for long-term (current) use of other medications - Plan: CBC with Differential, Comprehensive metabolic panel  CKD (chronic kidney disease), stage III  Muscle cramps - Plan: cyclobenzaprine (FLEXERIL) 10 MG tablet, tiZANidine (ZANAFLEX) 4 MG tablet  Unspecified vitamin D deficiency  Intertrochanteric fracture of left hip, sequela  apparently the only muscle relaxer his insurance will allow is Zanaflex. Recommend that he take a good multivitamin as well as extra vitamin D. He was given Norco. I will have him return here in 2 weeks.

## 2013-09-06 NOTE — Telephone Encounter (Signed)
tsd  °

## 2013-09-09 ENCOUNTER — Telehealth: Payer: Self-pay

## 2013-09-09 NOTE — Telephone Encounter (Signed)
NURSE FROM [facility] CALLED WANTED VERBAL FOR P/T AND ALSO SAID PT SAID HE IS IN NEED OF PAIN MED REFILL PLEASE ADVISE

## 2013-09-12 ENCOUNTER — Other Ambulatory Visit: Payer: Self-pay | Admitting: *Deleted

## 2013-09-12 DIAGNOSIS — Z48812 Encounter for surgical aftercare following surgery on the circulatory system: Secondary | ICD-10-CM

## 2013-09-12 DIAGNOSIS — I739 Peripheral vascular disease, unspecified: Secondary | ICD-10-CM

## 2013-09-13 ENCOUNTER — Telehealth: Payer: Self-pay | Admitting: Family Medicine

## 2013-09-13 NOTE — Telephone Encounter (Signed)
Mr. Carlton Adam, physical therapist with [facility], called and stated that at yesterday's session Mr. Senn was in a lot of pain. He was limping very badly. Physical therapist was concerned and wanted to inform us. Please follow up with Mr. Randlett.

## 2013-09-13 NOTE — Telephone Encounter (Signed)
I would have him come in and see Dr. Redmond School Monday, or if he is still seeing ortho, f/u with them Monday

## 2013-09-13 NOTE — Telephone Encounter (Signed)
Pt was called and asked if he would like to move his appt up to Monday. He is already scheduled for Thursday. Pt states he will leave it alone.

## 2013-09-19 ENCOUNTER — Encounter: Payer: Self-pay | Admitting: Family Medicine

## 2013-09-19 ENCOUNTER — Ambulatory Visit (INDEPENDENT_AMBULATORY_CARE_PROVIDER_SITE_OTHER): Payer: Medicare Other | Admitting: Family Medicine

## 2013-09-19 VITALS — BP 144/70 | HR 60 | Wt 163.0 lb

## 2013-09-19 DIAGNOSIS — S72002S Fracture of unspecified part of neck of left femur, sequela: Secondary | ICD-10-CM

## 2013-09-19 DIAGNOSIS — E559 Vitamin D deficiency, unspecified: Secondary | ICD-10-CM

## 2013-09-19 DIAGNOSIS — D62 Acute posthemorrhagic anemia: Secondary | ICD-10-CM

## 2013-09-19 LAB — CBC WITH DIFFERENTIAL/PLATELET
Basophils Absolute: 0 10*3/uL (ref 0.0–0.1)
Basophils Relative: 0 % (ref 0–1)
Eosinophils Absolute: 0.1 10*3/uL (ref 0.0–0.7)
Eosinophils Relative: 2 % (ref 0–5)
HCT: 31 % — ABNORMAL LOW (ref 39.0–52.0)
Lymphocytes Relative: 24 % (ref 12–46)
MCH: 31 pg (ref 26.0–34.0)
Monocytes Absolute: 0.6 10*3/uL (ref 0.1–1.0)
Neutro Abs: 3.9 10*3/uL (ref 1.7–7.7)
Neutrophils Relative %: 65 % (ref 43–77)
Platelets: 151 10*3/uL (ref 150–400)
RDW: 15.8 % — ABNORMAL HIGH (ref 11.5–15.5)
WBC: 6 10*3/uL (ref 4.0–10.5)

## 2013-09-19 LAB — COMPREHENSIVE METABOLIC PANEL
ALT: 14 U/L (ref 0–53)
AST: 12 U/L (ref 0–37)
Albumin: 3.9 g/dL (ref 3.5–5.2)
Alkaline Phosphatase: 139 U/L — ABNORMAL HIGH (ref 39–117)
Calcium: 8.2 mg/dL — ABNORMAL LOW (ref 8.4–10.5)
Chloride: 115 mEq/L — ABNORMAL HIGH (ref 96–112)
Potassium: 4.5 mEq/L (ref 3.5–5.3)
Sodium: 142 mEq/L (ref 135–145)
Total Protein: 6.5 g/dL (ref 6.0–8.3)

## 2013-09-19 NOTE — Progress Notes (Signed)
  Subjective:    Patient ID: Christian Daft., male    DOB: May 17, 1931, 79 y.o.   MRN: [id number]  HPI He is here for recheck. Very difficult to get a good history from him. He states that he did not get the muscle relaxer although his son says he took it. He also has a call into getting the muscle relaxer from orthopedics. Review of record indicates his kidney function is questionable. He continues to complain of hip pain. He was supposed to be on vitamin D supplementation and has a bottle has not started taking it.   Review of Systems     Objective:   Physical Exam  Alert and in no distress otherwise not examined      Assessment & Plan:  Acute blood loss anemia - Plan: CBC with Differential  CKD (chronic kidney disease), stage III - Plan: Comprehensive metabolic panel  Hip fracture, left, sequela  Vitamin D deficiency  strongly encouraged him to use a vitamin D supplement. He will also get a muscle relaxer to help with the spasms. He is very difficult to deal with due to his underlying personality.

## 2013-09-23 ENCOUNTER — Ambulatory Visit: Payer: Medicare Other | Admitting: Neurosurgery

## 2013-09-26 ENCOUNTER — Other Ambulatory Visit: Payer: Self-pay

## 2013-09-27 ENCOUNTER — Encounter: Payer: Self-pay | Admitting: Family

## 2013-09-30 ENCOUNTER — Ambulatory Visit (INDEPENDENT_AMBULATORY_CARE_PROVIDER_SITE_OTHER): Payer: Medicare Other | Admitting: Family

## 2013-09-30 ENCOUNTER — Ambulatory Visit (HOSPITAL_COMMUNITY)
Admission: RE | Admit: 2013-09-30 | Discharge: 2013-09-30 | Disposition: A | Discharge status: Home / Self Care | Payer: Medicare Other | Source: Ambulatory Visit | Attending: Family | Admitting: Family

## 2013-09-30 ENCOUNTER — Encounter: Payer: Self-pay | Admitting: Family

## 2013-09-30 ENCOUNTER — Other Ambulatory Visit: Payer: Self-pay | Admitting: *Deleted

## 2013-09-30 VITALS — BP 154/62 | HR 53 | Resp 14 | Ht 69.0 in | Wt 162.0 lb

## 2013-09-30 DIAGNOSIS — I739 Peripheral vascular disease, unspecified: Secondary | ICD-10-CM | POA: Insufficient documentation

## 2013-09-30 DIAGNOSIS — Z48812 Encounter for surgical aftercare following surgery on the circulatory system: Secondary | ICD-10-CM

## 2013-09-30 DIAGNOSIS — I714 Abdominal aortic aneurysm, without rupture: Secondary | ICD-10-CM | POA: Insufficient documentation

## 2013-09-30 NOTE — Progress Notes (Signed)
[facility] [place]  Established Abdominal Aortic Aneurysm  History of Present Illness  [name]. is a 79 y.o. (October 27, 1931) male patient of Dr. Trula Slade who presents with chief complaint: follow up for AAA; he is S/P open AAA repair on  05/19/2010.  Previous studies demonstrate an AAA, measuring 5.9 cm on 03/24/10 by CT angiogram. The patient is not a smoker. The patient denies claudication in legs with walking, he had left hip fracture and repair recently and has pain with walking due to this.States neuropathy in hands and feet, feels numb at times. Denies stroke hx or TIA. 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 Denies claudication in legs before he fractured his hip 2 months ago. Denies nonhealing wounds. The patient denies history of stroke or TIA symptoms.  Pt Diabetic: No  Past Medical History  Diagnosis Date  . Hypothyroid   . BPH (benign prostatic hyperplasia)   . Renal insufficiency   . Glucose intolerance (impaired glucose tolerance)   . Complication of anesthesia     "a tough time"  -pt unable to explain  . Hypertension   . Pancreatitis    Past Surgical History  Procedure Laterality Date  . Abdominal aortic aneurysm repair    . Prostate surgery    . Amputation Right 01/03/2013    Procedure: AMPUTATION DIGIT;  Surgeon: Wylene Simmer, MD;  Location: [facility];  Service: Orthopedics;  Laterality: Right;  RIGHT 2ND TOE AMPUTATION  . Hip pinning,cannulated Left 08/08/2013    Procedure: LEFT CANNULATED HIP PINNING;  Surgeon: Rozanna Box, MD;  Location: [facility];  Service: Orthopedics;  Laterality: Left;  . Steriod injection Bilateral 08/08/2013    Procedure: STEROID INJECTION;  Surgeon: Rozanna Box, MD;  Location: [facility];  Service: Orthopedics;  Laterality: Bilateral;   Social History History   Social History  . Marital Status: Single    Spouse Name: N/A    Number of Children: N/A  . Years of Education: N/A   Occupational  History  . Not on file.   Social History Main Topics  . Smoking status: Former Smoker -- 0.30 packs/day for 60 years    Quit date: 10/02/2012  . Smokeless tobacco: Never Used  . Alcohol Use: Yes  . Drug Use: No  . Sexual Activity: Not on file   Other Topics Concern  . Not on file   Social History Narrative  . No narrative on file   Family History Family History  Problem Relation Age of Onset  . Heart attack Mother   . Heart attack Father     Current Outpatient Prescriptions on File Prior to Visit  Medication Sig Dispense Refill  . HYDROcodone-acetaminophen (NORCO/VICODIN) 5-325 MG per tablet Take 1-2 tablets by mouth every 4 (four) hours as needed.  60 tablet  0  . levothyroxine (SYNTHROID, LEVOTHROID) 88 MCG tablet Take 88 mcg by mouth every morning.      . magnesium oxide (MAG-OX) 400 MG tablet Take 400 mg by mouth daily.      Marland Kitchen OVER THE COUNTER MEDICATION VITAMIN D 800 IU 2 DAILY      . tamsulosin (FLOMAX) 0.4 MG CAPS capsule Take 1 capsule (0.4 mg total) by mouth daily after supper.  30 capsule    . tiZANidine (ZANAFLEX) 4 MG tablet Take 1 tablet (4 mg total) by mouth every 6 (six) hours as needed.  30 tablet  0   No current facility-administered medications on file prior to visit.  Allergies  Allergen Reactions  . Aspirin     ROS: [x]  Positive   [ ]  Negative   [ ]  All sytems reviewed and are negative  General: [ ]  Weight loss, [ ]  Fever, [ ]  chills Neurologic: [ ]  Dizziness, [ ]  Blackouts, [ ]  Seizure [ ]  Stroke, [ ]  "Mini stroke", [ ]  Slurred speech, [ ]  Temporary blindness; [ ]  weakness in arms or legs, [ ]  Hoarseness Cardiac: [ ]  Chest pain/pressure, [ ]  Shortness of breath at rest [ ]  Shortness of breath with exertion, [ ]  Atrial fibrillation or irregular heartbeat Vascular: [ ]  Pain in legs with walking, [ ]  Pain in legs at rest, [ ]  Pain in legs at night,  [ ]  Non-healing ulcer, [ ]  Blood clot in vein/DVT,   Pulmonary: [ ]  Home oxygen, [ ]  Productive cough,  [ ]  Coughing up blood, [ ]  Asthma,  [ ]  Wheezing Musculoskeletal:  [ ]  Arthritis, [ ]  Low back pain, [ ]  Joint pain Hematologic: [ ]  Easy Bruising, [ ]  Anemia; [ ]  Hepatitis Gastrointestinal: [ ]  Blood in stool, [ ]  Gastroesophageal Reflux/heartburn, [ ]  Trouble swallowing Urinary: [ ]  chronic Kidney disease, [ ]  on HD - [ ]  MWF or [ ]  TTHS, [ ]  Burning with urination, [ ]  Difficulty urinating Skin: [ ]  Rashes, [ ]  Wounds Psychological: [ ]  Anxiety, [ ]  Depression  Physical Examination  Filed Vitals:   09/30/13 0945  BP: 154/62  Pulse: 53  Resp: 14   Filed Weights   09/30/13 0945  Weight: 162 lb (73.483 kg)   Body mass index is 23.91 kg/(m^2).  General: A&O x 3, warm and dry, using walker.  Pulmonary: Sym exp, good air movt, CTAB, no rales, rhonchi, or wheezing.   Cardiac: RRR, Nl S1, S2, no Murmurs, rubs or gallops.  Carotid Bruits Left Right   Negative Negative   Aorta is not palpable.     Bilateral radial pulses are 2+ and palpable.                    VASCULAR EXAM:                                                                                                         LE Pulses LEFT RIGHT       FEMORAL   palpable   palpable        POPLITEAL  not palpable   not palpable       POSTERIOR TIBIAL  not palpable   not palpable        DORSALIS PEDIS      ANTERIOR TIBIAL  palpable  not palpable      Gastrointestinal: soft, NTND, -G/R, - HSM, - masses.  Musculoskeletal: M/S 5/5 in UE's , 3/5 in LE's except extremities without ischemic changes. Right second toe missing.  Neurologic: CN 2-12 intact  except moderately hard of hearing, Pain and light touch intact in extremities, Motor exam as listed above.  Non-Invasive Vascular Imaging  AAA Duplex (09/30/2013) Previous studies demonstrate  an AAA, measuring 5.9 cm on 03/24/10 by CT angiogram.  Current size:  2.6 cm (Date: 09/30/2013) 340 cm/sec left CIA velocity.   Medical Decision Making  The patient is a 79  y.o. male who is s/p open AAA repair in 2011, presents with asymptomatic AAA with decrease in size from 5.9 to 2.6 cm in 3.5 years, no endoleak. Moderate stenosis in left CIA noted. Will interrogate the left CIA in addition to the AAA Duplex when he returns in 1 year.   Based on this patient's exam and diagnostic studies, the patient will follow up in 1 year  with the following studies: AAA and iliac Duplex.  The threshold for repair is AAA size > 5.5 cm, growth > 1 cm/yr, and symptomatic status.  I emphasized the importance of maximal medical management including strict control of blood pressure, blood glucose, and lipid levels, antiplatelet agents, obtaining regular exercise, and continued cessation of smoking.   The patient was given information about AAA including signs, symptoms, treatment, and how to minimize the risk of enlargement and rupture of aneurysms.    The patient was advised to call 911 should the patient experience sudden onset abdominal or back pain.   Thank you for allowing [place] to participate in this patient's care.  Clemon Chambers, RN, MSN, FNP-C [facility] Office: [phone]  Clinic Physician: Trula Slade  09/30/2013, 9:31 AM

## 2013-09-30 NOTE — Patient Instructions (Signed)

## 2013-10-04 ENCOUNTER — Emergency Department (HOSPITAL_COMMUNITY)
Admission: EM | Admit: 2013-10-04 | Discharge: 2013-10-04 | Disposition: A | Discharge status: Home / Self Care | Payer: Medicare Other | Attending: Emergency Medicine | Admitting: Emergency Medicine

## 2013-10-04 ENCOUNTER — Encounter (HOSPITAL_COMMUNITY): Payer: Self-pay | Admitting: Emergency Medicine

## 2013-10-04 ENCOUNTER — Emergency Department (HOSPITAL_COMMUNITY): Payer: Medicare Other

## 2013-10-04 DIAGNOSIS — Z8719 Personal history of other diseases of the digestive system: Secondary | ICD-10-CM | POA: Insufficient documentation

## 2013-10-04 DIAGNOSIS — M25552 Pain in left hip: Secondary | ICD-10-CM

## 2013-10-04 DIAGNOSIS — Z87828 Personal history of other (healed) physical injury and trauma: Secondary | ICD-10-CM | POA: Insufficient documentation

## 2013-10-04 DIAGNOSIS — M79609 Pain in unspecified limb: Secondary | ICD-10-CM

## 2013-10-04 DIAGNOSIS — E039 Hypothyroidism, unspecified: Secondary | ICD-10-CM | POA: Insufficient documentation

## 2013-10-04 DIAGNOSIS — Z79899 Other long term (current) drug therapy: Secondary | ICD-10-CM | POA: Insufficient documentation

## 2013-10-04 DIAGNOSIS — I10 Essential (primary) hypertension: Secondary | ICD-10-CM | POA: Insufficient documentation

## 2013-10-04 DIAGNOSIS — M25559 Pain in unspecified hip: Secondary | ICD-10-CM | POA: Insufficient documentation

## 2013-10-04 DIAGNOSIS — N400 Enlarged prostate without lower urinary tract symptoms: Secondary | ICD-10-CM | POA: Insufficient documentation

## 2013-10-04 DIAGNOSIS — Z87891 Personal history of nicotine dependence: Secondary | ICD-10-CM | POA: Insufficient documentation

## 2013-10-04 DIAGNOSIS — G629 Polyneuropathy, unspecified: Secondary | ICD-10-CM

## 2013-10-04 DIAGNOSIS — S98139A Complete traumatic amputation of one unspecified lesser toe, initial encounter: Secondary | ICD-10-CM | POA: Insufficient documentation

## 2013-10-04 DIAGNOSIS — Z87448 Personal history of other diseases of urinary system: Secondary | ICD-10-CM | POA: Insufficient documentation

## 2013-10-04 MED ORDER — OXYCODONE-ACETAMINOPHEN 5-325 MG PO TABS: 1.0000 | ORAL_TABLET | Freq: Four times a day (QID) | ORAL | Status: DC | PRN

## 2013-10-04 MED ORDER — GABAPENTIN 600 MG PO TABS
300.0000 mg | ORAL_TABLET | Freq: Once | ORAL | Status: DC
  Filled 2013-10-04 (×2): qty 0.5

## 2013-10-04 MED ORDER — GABAPENTIN 300 MG PO CAPS
300.0000 mg | ORAL_CAPSULE | Freq: Once | ORAL | Status: AC
  Administered 2013-10-04: 300 mg via ORAL

## 2013-10-04 MED ORDER — GABAPENTIN 300 MG PO CAPS: 300.0000 mg | ORAL_CAPSULE | Freq: Every day | ORAL | Status: DC

## 2013-10-04 NOTE — Progress Notes (Signed)
*  PRELIMINARY RESULTS* Vascular Ultrasound Left lower extremity venous duplex has been completed.  Preliminary findings: no evidence of DVT.    Landry Mellow, RDMS, RVT  10/04/2013, 8:32 AM

## 2013-10-04 NOTE — ED Notes (Signed)
MD at bedside. 

## 2013-10-04 NOTE — ED Provider Notes (Signed)
CSN: [id number]     Arrival date & time 10/04/13  [id number] History   First MD Initiated Contact with Patient 10/04/13 0710     Chief Complaint  Patient presents with  . Leg Pain   (Consider location/radiation/quality/duration/timing/severity/associated sxs/prior Treatment) Patient is a 79 y.o. male presenting with leg pain. The history is provided by the patient.  Leg Pain Location:  Hip Associated symptoms: no back pain and no fever    patient had hip pinning of his left hip after fall in September. He states since then he has had some pain in his left lower leg from the knee down. He states over the last 2 days she's had pain from his left hip/pelvic down his leg. No weakness. He states he has difficulty walking due to the pain. She's been seen since his surgery by Dr. handy. He states the x-rays look good. No new injury. No abdominal pain.  Past Medical History  Diagnosis Date  . Hypothyroid   . BPH (benign prostatic hyperplasia)   . Renal insufficiency   . Glucose intolerance (impaired glucose tolerance)   . Complication of anesthesia     "a tough time"  -pt unable to explain  . Hypertension   . Pancreatitis   . AAA (abdominal aortic aneurysm)    Past Surgical History  Procedure Laterality Date  . Abdominal aortic aneurysm repair    . Prostate surgery    . Amputation Right 01/03/2013    Procedure: AMPUTATION DIGIT;  Surgeon: Wylene Simmer, MD;  Location: [facility];  Service: Orthopedics;  Laterality: Right;  RIGHT 2ND TOE AMPUTATION  . Hip pinning,cannulated Left 08/08/2013    Procedure: LEFT CANNULATED HIP PINNING;  Surgeon: Rozanna Box, MD;  Location: [facility];  Service: Orthopedics;  Laterality: Left;  . Steriod injection Bilateral 08/08/2013    Procedure: STEROID INJECTION;  Surgeon: Rozanna Box, MD;  Location: [facility];  Service: Orthopedics;  Laterality: Bilateral;   Family History  Problem Relation Age of Onset  . Heart attack Mother   . Heart disease Mother   . Heart  attack Father   . Diabetes Son     amputation-BKA    History  Substance Use Topics  . Smoking status: Former Smoker -- 0.30 packs/day for 60 years    Quit date: 10/02/2012  . Smokeless tobacco: Never Used  . Alcohol Use: Yes    Review of Systems  Constitutional: Negative for fever, activity change and appetite change.  Eyes: Negative for pain.  Respiratory: Negative for chest tightness and shortness of breath.   Cardiovascular: Negative for chest pain and leg swelling.  Gastrointestinal: Negative for nausea, vomiting, abdominal pain and diarrhea.  Genitourinary: Negative for flank pain.  Musculoskeletal: Positive for gait problem. Negative for back pain and neck stiffness.  Skin: Negative for rash.  Neurological: Negative for weakness, numbness and headaches.  Psychiatric/Behavioral: Negative for behavioral problems.    Allergies  Aspirin  Home Medications   Current Outpatient Rx  Name  Route  Sig  Dispense  Refill  . DUREZOL 0.05 % EMUL      Injection every 6 wks into eye         . HYDROcodone-acetaminophen (NORCO/VICODIN) 5-325 MG per tablet   Oral   Take 1-2 tablets by mouth every 4 (four) hours as needed.   60 tablet   0   . levothyroxine (SYNTHROID, LEVOTHROID) 88 MCG tablet   Oral   Take 88 mcg by mouth every morning.         Marland Kitchen  magnesium oxide (MAG-OX) 400 MG tablet   Oral   Take 400 mg by mouth daily.         . Multiple Vitamins-Minerals (EYE VITAMINS PO)   Oral   Take 4 tablets by mouth daily.         Marland Kitchen OVER THE COUNTER MEDICATION      VITAMIN D 800 IU 2 DAILY         . tamsulosin (FLOMAX) 0.4 MG CAPS capsule   Oral   Take 1 capsule (0.4 mg total) by mouth daily after supper.   30 capsule      . tiZANidine (ZANAFLEX) 4 MG tablet   Oral   Take 1 tablet (4 mg total) by mouth every 6 (six) hours as needed.   30 tablet   0   . gabapentin (NEURONTIN) 300 MG capsule   Oral   Take 1 capsule (300 mg total) by mouth at bedtime.   14  capsule   0   . oxyCODONE-acetaminophen (PERCOCET/ROXICET) 5-325 MG per tablet   Oral   Take 1-2 tablets by mouth every 6 (six) hours as needed for severe pain.   15 tablet   0    BP 175/67  Pulse 67  Temp(Src) 97.8 F (36.6 C) (Oral)  Resp 16  SpO2 100% Physical Exam  Nursing note and vitals reviewed. Constitutional: He is oriented to person, place, and time. He appears well-developed and well-nourished.  HENT:  Head: Normocephalic and atraumatic.  Eyes: EOM are normal. Pupils are equal, round, and reactive to light.  Neck: Normal range of motion. Neck supple.  Cardiovascular: Normal rate, regular rhythm and normal heart sounds.   No murmur heard. Pulmonary/Chest: Effort normal and breath sounds normal.  Abdominal: Soft. Bowel sounds are normal. He exhibits no distension and no mass. There is no tenderness. There is no rebound and no guarding.  Musculoskeletal: He exhibits no edema.  Somewhat decreased range of motion left hip. Not irritable Mild pain of the left knee. Neurovascularly intact to her foot. Dorsalis pedis pulse is strong. Wound healing well on hip.  Neurological: He is alert and oriented to person, place, and time. No cranial nerve deficit.  Skin: Skin is warm and dry.  Psychiatric: He has a normal mood and affect.    ED Course  Procedures (including critical care time) Labs Review Labs Reviewed - No data to display Imaging Review Dg Lumbar Spine Complete  10/04/2013   CLINICAL DATA:  Leg pain post fall 2 months ago, low back pain  EXAM: LUMBAR SPINE - COMPLETE 4+ VIEW  COMPARISON:  03/24/2010  FINDINGS: Five views of lumbar spine submitted. No acute fracture or subluxation. There is disc space flattening with anterior spurring at T11-T12 and T12-L1 level. Mild anterior spurring upper endplate of [id number] and L5 vertebral body. Mild disc space flattening at L5-S1 level. Facet degenerative changes L4 and L5 level. Diffuse osteopenia.  IMPRESSION: No acute fracture  or subluxation. Degenerative changes as described above. Diffuse osteopenia.   Electronically Signed   By: Lahoma Crocker M.D.   On: 10/04/2013 08:46   Dg Hip Complete Left  10/04/2013   CLINICAL DATA:  Status post left hip surgery 2 months ago after a fall and with a fracture. Left hip pain and limited range of motion.  EXAM: LEFT HIP - COMPLETE 2+ VIEW  COMPARISON:  Single view of the pelvis 08/07/2013.  FINDINGS: Since the prior examination, the patient has undergone fixation of a left hip fracture with  5 lag screws in place. Hardware appears intact. There is no acute fracture. Only mild degenerative change is seen about the hips. No evidence of avascular necrosis is identified. No focal bony lesion.  IMPRESSION: No acute finding.  Status post fixation of a left hip fracture without evidence of complication.  Mild bilateral degenerative change appears symmetric.   Electronically Signed   By: Inge Rise M.D.   On: 10/04/2013 08:41   Dg Knee Complete 4 Views Left  10/04/2013   CLINICAL DATA:  Left knee pain.  Status post fall 2 months ago.  EXAM: LEFT KNEE - COMPLETE 4+ VIEW  COMPARISON:  Plain films left knee 08/07/2013.  FINDINGS: There is no acute bony or joint abnormality. Again seen is tricompartmental osteoarthritis with joint space loss and osteophytosis worst medially. No joint effusion.  IMPRESSION: No acute finding.  Tricompartmental osteoarthritis appearing worst in the medial compartment where it is moderately severe.   Electronically Signed   By: Inge Rise M.D.   On: 10/04/2013 08:45    EKG Interpretation   None       MDM   1. Hip pain, left   2. Neuropathy    Patient with pain in his left hip. Has a recent placement. Negative Doppler. X-ray is reassuring. Does have a history of neuropathy. Will start low-dose Neurontin. We'll follow with PCP and orthopedics    Jasper Riling. Alvino Chapel, [place] 10/04/13 [phone]

## 2013-10-04 NOTE — ED Notes (Signed)
Patient transported to Ultrasound 

## 2013-10-04 NOTE — ED Notes (Signed)
Pt ambulated to restroom. Waiting for neurontin to come from pharmacy.

## 2013-10-04 NOTE — ED Notes (Signed)
Pt reports he had hip surgery due to hip fracture Sept 18 and has pain from left hip down to foot. Pedal pulse palpable.

## 2013-10-10 ENCOUNTER — Other Ambulatory Visit: Payer: Self-pay | Admitting: Orthopedic Surgery

## 2013-10-10 ENCOUNTER — Encounter (INDEPENDENT_AMBULATORY_CARE_PROVIDER_SITE_OTHER): Payer: Medicare Other | Admitting: Ophthalmology

## 2013-10-10 DIAGNOSIS — IMO0002 Reserved for concepts with insufficient information to code with codable children: Secondary | ICD-10-CM

## 2013-10-10 DIAGNOSIS — H35039 Hypertensive retinopathy, unspecified eye: Secondary | ICD-10-CM

## 2013-10-10 DIAGNOSIS — M25552 Pain in left hip: Secondary | ICD-10-CM

## 2013-10-10 DIAGNOSIS — I10 Essential (primary) hypertension: Secondary | ICD-10-CM

## 2013-10-10 DIAGNOSIS — H2510 Age-related nuclear cataract, unspecified eye: Secondary | ICD-10-CM

## 2013-10-10 DIAGNOSIS — H353290 Exudative age-related macular degeneration, unspecified eye, stage unspecified: Secondary | ICD-10-CM

## 2013-10-10 DIAGNOSIS — H43819 Vitreous degeneration, unspecified eye: Secondary | ICD-10-CM

## 2013-10-10 DIAGNOSIS — H3530 Unspecified macular degeneration: Secondary | ICD-10-CM

## 2013-10-14 ENCOUNTER — Ambulatory Visit
Admission: RE | Admit: 2013-10-14 | Discharge: 2013-10-14 | Disposition: A | Discharge status: Home / Self Care | Payer: Medicare Other | Source: Ambulatory Visit | Attending: Orthopedic Surgery | Admitting: Orthopedic Surgery

## 2013-10-14 DIAGNOSIS — M25552 Pain in left hip: Secondary | ICD-10-CM

## 2013-10-14 DIAGNOSIS — IMO0002 Reserved for concepts with insufficient information to code with codable children: Secondary | ICD-10-CM

## 2013-11-29 ENCOUNTER — Telehealth: Payer: Self-pay | Admitting: Internal Medicine

## 2013-11-29 MED ORDER — LEVOTHYROXINE SODIUM 88 MCG PO TABS: 88.0000 ug | ORAL_TABLET | Freq: Every morning | ORAL | Status: DC

## 2013-11-29 MED ORDER — TRIAMCINOLONE ACETONIDE 0.1 % EX CREA: 1.0000 "application " | TOPICAL_CREAM | Freq: Two times a day (BID) | CUTANEOUS | Status: DC

## 2013-11-29 NOTE — Telephone Encounter (Signed)
Pt states that he needs a refill on levothyroxine and triamcinolone acetonide cream for when he has a flare up on his skin. Send to [facility]

## 2013-12-03 ENCOUNTER — Emergency Department (HOSPITAL_COMMUNITY): Payer: Medicare Other

## 2013-12-03 ENCOUNTER — Encounter (HOSPITAL_COMMUNITY): Payer: Self-pay | Admitting: Emergency Medicine

## 2013-12-03 ENCOUNTER — Emergency Department (HOSPITAL_COMMUNITY)
Admission: EM | Admit: 2013-12-03 | Discharge: 2013-12-03 | Disposition: A | Discharge status: Home / Self Care | Payer: Medicare Other | Attending: Emergency Medicine | Admitting: Emergency Medicine

## 2013-12-03 DIAGNOSIS — R296 Repeated falls: Secondary | ICD-10-CM | POA: Insufficient documentation

## 2013-12-03 DIAGNOSIS — S32009A Unspecified fracture of unspecified lumbar vertebra, initial encounter for closed fracture: Secondary | ICD-10-CM | POA: Insufficient documentation

## 2013-12-03 DIAGNOSIS — E039 Hypothyroidism, unspecified: Secondary | ICD-10-CM | POA: Insufficient documentation

## 2013-12-03 DIAGNOSIS — Z87448 Personal history of other diseases of urinary system: Secondary | ICD-10-CM | POA: Insufficient documentation

## 2013-12-03 DIAGNOSIS — Z87891 Personal history of nicotine dependence: Secondary | ICD-10-CM | POA: Insufficient documentation

## 2013-12-03 DIAGNOSIS — Y92009 Unspecified place in unspecified non-institutional (private) residence as the place of occurrence of the external cause: Secondary | ICD-10-CM | POA: Insufficient documentation

## 2013-12-03 DIAGNOSIS — Y9389 Activity, other specified: Secondary | ICD-10-CM | POA: Insufficient documentation

## 2013-12-03 DIAGNOSIS — Z79899 Other long term (current) drug therapy: Secondary | ICD-10-CM | POA: Insufficient documentation

## 2013-12-03 DIAGNOSIS — I10 Essential (primary) hypertension: Secondary | ICD-10-CM | POA: Insufficient documentation

## 2013-12-03 DIAGNOSIS — S32020A Wedge compression fracture of second lumbar vertebra, initial encounter for closed fracture: Secondary | ICD-10-CM

## 2013-12-03 DIAGNOSIS — Z8719 Personal history of other diseases of the digestive system: Secondary | ICD-10-CM | POA: Insufficient documentation

## 2013-12-03 MED ORDER — OXYCODONE-ACETAMINOPHEN 5-325 MG PO TABS
2.0000 | ORAL_TABLET | Freq: Once | ORAL | Status: AC
  Administered 2013-12-03: 2 via ORAL
  Filled 2013-12-03: qty 2

## 2013-12-03 MED ORDER — FENTANYL CITRATE 0.05 MG/ML IJ SOLN
100.0000 ug | Freq: Once | INTRAMUSCULAR | Status: AC
  Administered 2013-12-03: 100 ug via INTRAMUSCULAR
  Filled 2013-12-03: qty 2

## 2013-12-03 MED ORDER — OXYCODONE-ACETAMINOPHEN 5-325 MG PO TABS: 1.0000 | ORAL_TABLET | Freq: Four times a day (QID) | ORAL | Status: DC | PRN

## 2013-12-03 NOTE — Discharge Instructions (Signed)
Back, Compression Fracture °A compression fracture happens when a force is put upon the length of your spine. Slipping and falling on your bottom are examples of such a force. When this happens, sometimes the force is great enough to compress the building blocks (vertebral bodies) of your spine. Although this causes a lot of pain, this can usually be treated at home, unless your caregiver feels hospitalization is needed for pain control. °Your backbone (spinal column) is made up of 24 main vertebral bodies in addition to the sacrum and coccyx (see illustration). These are held together by tough fibrous tissues (ligaments) and by support of your muscles. Nerve roots pass through the openings between the vertebrae. A sudden wrenching move, injury, or a fall may cause a compression fracture of one of the vertebral bodies. This may result in back pain or spread of pain into the belly (abdomen), the buttocks, and down the leg into the foot. Pain may also be created by muscle spasm alone. °Large studies have been undertaken to determine the best possible course of action to help your back following injury and also to prevent future problems. The recommendations are as follows. °FOLLOWING A COMPRESSION FRACTURE: °Do the following only if advised by your caregiver.  °· If a back brace has been suggested or provided, wear it as directed. °· DO NOT stop wearing the back brace unless instructed by your caregiver. °· When allowed to return to regular activities, avoid a sedentary life style. Actively exercise. Sporadic weekend binges of tennis, racquetball, water skiing, may actually aggravate or create problems, especially if you are not in condition for that activity. °· Avoid sports requiring sudden body movements until you are in condition for them. Swimming and walking are safer activities. °· Maintain good posture. °· Avoid obesity. °· If not already done, you should have a DEXA scan. Based on the results, be treated for  osteoporosis. °FOLLOWING ACUTE (SUDDEN) INJURY: °· Only take over-the-counter or prescription medicines for pain, discomfort, or fever as directed by your caregiver. °· Use bed rest for only the most extreme acute episode. Prolonged bed rest may aggravate your condition. Ice used for acute conditions is effective. Use a large plastic bag filled with ice. Wrap it in a towel. This also provides excellent pain relief. This may be continuous. Or use it for 30 minutes every 2 hours during acute phase, then as needed. Heat for 30 minutes prior to activities is helpful. °· As soon as the acute phase (the time when your back is too painful for you to do normal activities) is over, it is important to resume normal activities and work hardening programs. Back injuries can cause potentially marked changes in lifestyle. So it is important to attack these problems aggressively. °· See your caregiver for continued problems. He or she can help or refer you for appropriate exercises, physical therapy and work hardening if needed. °· If you are given narcotic medications for your condition, for the next 24 hours DO NOT: °· Drive °· Operate machinery or power tools. °· Sign legal documents. °· DO NOT drink alcohol, take sleeping pills or other medications that may interfere with treatment. °If your caregiver has given you a follow-up appointment, it is very important to keep that appointment. Not keeping the appointment could result in a chronic or permanent injury, pain, and disability. If there is any problem keeping the appointment, you must call back to this facility for assistance.  °SEEK IMMEDIATE MEDICAL CARE IF: °· You develop numbness,   tingling, weakness, or problems with the use of your arms or legs. °· You develop severe back pain not relieved with medications. °· You have changes in bowel or bladder control. °· You have increasing pain in any areas of the body. °Document Released: 11/07/2005 Document Revised: 01/30/2012  Document Reviewed: 06/11/2008 °[employer]® Patient Information ©2014 [employer]. ° °

## 2013-12-03 NOTE — ED Notes (Signed)
Pt. States that he was at home and got his feet tangled up and fell on his left hip and back. Left leg is longer than right side.

## 2013-12-03 NOTE — ED Notes (Signed)
Pt. States that he does not feel like he needs to urinate at this time.

## 2013-12-03 NOTE — ED Provider Notes (Signed)
CSN: [id number]     Arrival date & time 12/03/13  0144 History   First MD Initiated Contact with Patient 12/03/13 0256     Chief Complaint  Patient presents with  . Fall   (Consider location/radiation/quality/duration/timing/severity/associated sxs/prior Treatment) HPI This is an 80 year old male with a history of neuropathy. He also has a postoperative external rotation of his left lower extremity. As a result of his neuropathy he has poor proprioception and as a result fell this morning at home. He is having moderate to severe pain in his low back. The pain is worse with flexion or extension of the back. He denies new weakness or numbness.   Past Medical History  Diagnosis Date  . Hypothyroid   . BPH (benign prostatic hyperplasia)   . Renal insufficiency   . Glucose intolerance (impaired glucose tolerance)   . Complication of anesthesia     "a tough time"  -pt unable to explain  . Hypertension   . Pancreatitis   . AAA (abdominal aortic aneurysm)    Past Surgical History  Procedure Laterality Date  . Abdominal aortic aneurysm repair    . Prostate surgery    . Amputation Right 01/03/2013    Procedure: AMPUTATION DIGIT;  Surgeon: Wylene Simmer, MD;  Location: [facility];  Service: Orthopedics;  Laterality: Right;  RIGHT 2ND TOE AMPUTATION  . Hip pinning,cannulated Left 08/08/2013    Procedure: LEFT CANNULATED HIP PINNING;  Surgeon: Rozanna Box, MD;  Location: [facility];  Service: Orthopedics;  Laterality: Left;  . Steriod injection Bilateral 08/08/2013    Procedure: STEROID INJECTION;  Surgeon: Rozanna Box, MD;  Location: [facility];  Service: Orthopedics;  Laterality: Bilateral;   Family History  Problem Relation Age of Onset  . Heart attack Mother   . Heart disease Mother   . Heart attack Father   . Diabetes Son     amputation-BKA    History  Substance Use Topics  . Smoking status: Former Smoker -- 0.30 packs/day for 60 years    Quit date: 10/02/2012  . Smokeless tobacco: Never  Used  . Alcohol Use: Yes    Review of Systems  All other systems reviewed and are negative.    Allergies  Aspirin  Home Medications   Current Outpatient Rx  Name  Route  Sig  Dispense  Refill  . DUREZOL 0.05 % EMUL      Injection every 6 wks into eye         . levothyroxine (SYNTHROID, LEVOTHROID) 88 MCG tablet   Oral   Take 1 tablet (88 mcg total) by mouth every morning.   90 tablet   3   . Multiple Vitamins-Minerals (EYE VITAMINS PO)   Oral   Take 4 tablets by mouth daily.         Marland Kitchen OVER THE COUNTER MEDICATION      VITAMIN D 800 IU 2 DAILY         . oxyCODONE-acetaminophen (PERCOCET/ROXICET) 5-325 MG per tablet   Oral   Take 1-2 tablets by mouth every 6 (six) hours as needed for severe pain.   15 tablet   0   . triamcinolone cream (KENALOG) 0.1 %   Topical   Apply 1 application topically 2 (two) times daily.   30 g   5   . magnesium oxide (MAG-OX) 400 MG tablet   Oral   Take 400 mg by mouth daily.         . tamsulosin (FLOMAX) 0.4  MG CAPS capsule   Oral   Take 1 capsule (0.4 mg total) by mouth daily after supper.   30 capsule      . tiZANidine (ZANAFLEX) 4 MG tablet   Oral   Take 1 tablet (4 mg total) by mouth every 6 (six) hours as needed.   30 tablet   0    BP 168/78  Pulse 60  Resp 18  SpO2 98%  Physical Exam General: Well-developed, well-nourished male in no acute distress; appearance consistent with age of record HENT: normocephalic; atraumatic Eyes: pupils equal, round and reactive to light; extraocular muscles intact Neck: supple; nontender Heart: regular rate and rhythm Lungs: clear to auscultation bilaterally Abdomen: soft; nondistended; nontender; no masses or hepatosplenomegaly; bowel sounds present Back: Mild L-spine tenderness; significant pain on flexion of lower back Extremities: Mild external rotation of left lower extremity; arthritic changes; surgical absence of right second toe Neurologic: Awake, alert and  oriented; motor function intact in all extremities and symmetric; no facial droop Skin: Warm and dry Psychiatric: Normal mood and affect    ED Course  Procedures (including critical care time)    MDM  Nursing notes and vitals signs, including pulse oximetry, reviewed.  Summary of this visit's results, reviewed by myself:  Labs:  No results found for this or any previous visit (from the past 24 hour(s)).  Imaging Studies: Dg Lumbar Spine Complete  12/03/2013   CLINICAL DATA:  Fall, back pain.  EXAM: LUMBAR SPINE - COMPLETE 4+ VIEW  COMPARISON:  CT of the abdomen and pelvis Mar 24, 2010  FINDINGS: Very mild wedging of vertebral body L2 is new from prior examination. The remaining lumbar vertebral bodies appear intact. No malalignment. Mild L3-4 degenerative disc disease. Severe lower lumbar facet arthropathy.  No destructive bony lesions or pars interarticularis defects. Surgical clips in the mid abdomen. Mild vascular calcifications. Bone mineral density is decreased without destructive bony lesions. Partially imaged left femur pins.  IMPRESSION: Very mild wedging of vertebral body L2 may reflect acute compression fracture. No malalignment.  Similar degenerative change of the lumbar spine from CT of the abdomen and pelvis Mar 24, 2010.   Electronically Signed   By: Elon Alas   On: 12/03/2013 03:05   Dg Hip Complete Left  12/03/2013   CLINICAL DATA:  Fall with lower back and hip pain  EXAM: LEFT HIP - COMPLETE 2+ VIEW  COMPARISON:  08/08/2013  FINDINGS: No evidence of acute pelvic or femoral fracture. The femoral heads are located. Left femoral neck fracture lines are still visible. Fixation lag screws show no interval displacement.  IMPRESSION: 1.  No evidence of acute fracture. 2. Stable orientation of left femoral neck screws. The left femoral neck fracture remains partly visible.   Electronically Signed   By: Jorje Guild M.D.   On: 12/03/2013 03:06        Wynetta Fines,  MD 12/03/13 [phone]

## 2013-12-05 ENCOUNTER — Encounter (INDEPENDENT_AMBULATORY_CARE_PROVIDER_SITE_OTHER): Payer: Medicare Other | Admitting: Ophthalmology

## 2013-12-12 ENCOUNTER — Encounter (INDEPENDENT_AMBULATORY_CARE_PROVIDER_SITE_OTHER): Payer: Medicare Other | Admitting: Ophthalmology

## 2013-12-12 DIAGNOSIS — H353290 Exudative age-related macular degeneration, unspecified eye, stage unspecified: Secondary | ICD-10-CM

## 2013-12-12 DIAGNOSIS — H35039 Hypertensive retinopathy, unspecified eye: Secondary | ICD-10-CM

## 2013-12-12 DIAGNOSIS — I10 Essential (primary) hypertension: Secondary | ICD-10-CM

## 2013-12-12 DIAGNOSIS — H43819 Vitreous degeneration, unspecified eye: Secondary | ICD-10-CM

## 2013-12-12 DIAGNOSIS — H3530 Unspecified macular degeneration: Secondary | ICD-10-CM

## 2013-12-16 ENCOUNTER — Encounter: Payer: Self-pay | Admitting: Family Medicine

## 2014-01-28 ENCOUNTER — Encounter: Payer: Self-pay | Admitting: Family Medicine

## 2014-01-28 ENCOUNTER — Encounter (HOSPITAL_COMMUNITY): Payer: Self-pay | Admitting: Pharmacy Technician

## 2014-01-28 ENCOUNTER — Ambulatory Visit (INDEPENDENT_AMBULATORY_CARE_PROVIDER_SITE_OTHER): Payer: Medicare Other | Admitting: Family Medicine

## 2014-01-28 VITALS — BP 120/60 | HR 68 | Wt 153.0 lb

## 2014-01-28 DIAGNOSIS — N183 Chronic kidney disease, stage 3 unspecified: Secondary | ICD-10-CM

## 2014-01-28 DIAGNOSIS — S72009A Fracture of unspecified part of neck of unspecified femur, initial encounter for closed fracture: Secondary | ICD-10-CM

## 2014-01-28 DIAGNOSIS — R7303 Prediabetes: Secondary | ICD-10-CM

## 2014-01-28 DIAGNOSIS — R7309 Other abnormal glucose: Secondary | ICD-10-CM

## 2014-01-28 DIAGNOSIS — S72002A Fracture of unspecified part of neck of left femur, initial encounter for closed fracture: Secondary | ICD-10-CM

## 2014-01-29 ENCOUNTER — Encounter: Payer: Self-pay | Admitting: Family Medicine

## 2014-01-29 NOTE — Progress Notes (Signed)
   Subjective:    Patient ID: [name]., male    DOB: 15-Apr-1931, 80 y.o.   MRN: [id number]  HPI He is here for a preoperative evaluation. He has a very complicated history however he has had no recent cardiac or pulmonary problems. Does have a previous history of borderline blood sugar readings as well as renal insufficiency. He has a nonunion of a hip fracture and is scheduled for hip replacement. He has a previous history of AAA repair. He has had no history of recent chest pain, shortness of breath, PND, DOE   Review of Systems     Objective:   Physical Exam alert and in no distress. Tympanic membranes and canals are normal. Throat is clear. Tonsils are normal. Neck is supple without adenopathy or thyromegaly. Cardiac exam shows a regular sinus rhythm without murmurs or gallops. Lungs are clear to auscultation.         Assessment & Plan:  Borderline diabetes mellitus  CKD (chronic kidney disease), stage III  Hip fracture, left  he is cleared for surgery

## 2014-01-30 NOTE — Patient Instructions (Addendum)
[place].  01/30/2014   Your procedure is scheduled on: Monday March 23rd 2015  Report to [facility] at 1245 AM.  Call this number if you have problems the morning of surgery [phone]   Remember:  Do not eat food  :After Midnight.   clear liquids midnight until 900 am day of surgery, then nothing by mouth.   Take these medicines the morning of surgery with A SIP OF WATER: levothyroxine, oxycodone if needed                                [facility] [place] may not have any metal on your body including hair pins and piercings  Do not wear jewelry, make-up.  Do not wear lotions, powders, or perfumes. No  Deodorant is to be worn.   Men may shave face and neck.  Do not bring valuables to the hospital. [place].  Contacts, dentures or bridgework may not be worn into surgery.  Leave suitcase in the car. After surgery it may be brought to your room.  For patients admitted to the hospital, checkout time is 11:00 AM the day of discharge.    Please read over the following fact sheets that you were given: [facility] Preparing for surgery sheet,  incentive spirometer sheet, blood fact sheet, MRSA information  Call Zelphia Cairo RN pre op nurse if needed [phone]    [place].  PATIENT SIGNATURE___________________________________________  NURSE SIGNATURE_____________________________________________

## 2014-01-30 NOTE — Progress Notes (Signed)
09-30-13 lov suzanne nickel np vascular epic 09-30-13 US aorta epic 08-07-13 chest 1 view xray epic ekg 08-07-13 epic

## 2014-01-31 ENCOUNTER — Encounter (HOSPITAL_COMMUNITY)
Admission: RE | Admit: 2014-01-31 | Discharge: 2014-01-31 | Disposition: A | Discharge status: Home / Self Care | Payer: Medicare Other | Source: Ambulatory Visit | Attending: Orthopedic Surgery | Admitting: Orthopedic Surgery

## 2014-01-31 ENCOUNTER — Other Ambulatory Visit (HOSPITAL_COMMUNITY): Payer: Self-pay | Admitting: Orthopedic Surgery

## 2014-01-31 ENCOUNTER — Encounter (HOSPITAL_COMMUNITY): Payer: Self-pay

## 2014-01-31 DIAGNOSIS — Z01812 Encounter for preprocedural laboratory examination: Secondary | ICD-10-CM | POA: Insufficient documentation

## 2014-01-31 HISTORY — DX: Polyneuropathy, unspecified: G62.9

## 2014-01-31 HISTORY — DX: Unspecified osteoarthritis, unspecified site: M19.90

## 2014-01-31 LAB — PROTIME-INR
INR: 1.08 (ref 0.00–1.49)
Prothrombin Time: 13.8 seconds (ref 11.6–15.2)

## 2014-01-31 LAB — BASIC METABOLIC PANEL
BUN: 40 mg/dL — ABNORMAL HIGH (ref 6–23)
CALCIUM: 7.9 mg/dL — AB (ref 8.4–10.5)
CO2: 13 meq/L — AB (ref 19–32)
Chloride: 107 mEq/L (ref 96–112)
Creatinine, Ser: 1.83 mg/dL — ABNORMAL HIGH (ref 0.50–1.35)
GFR calc Af Amer: 38 mL/min — ABNORMAL LOW (ref >90)
GFR calc non Af Amer: 33 mL/min — ABNORMAL LOW (ref >90)
Glucose, Bld: 136 mg/dL — ABNORMAL HIGH (ref 70–99)
POTASSIUM: 4.7 meq/L (ref 3.7–5.3)
SODIUM: 136 meq/L — AB (ref 137–147)

## 2014-01-31 LAB — URINALYSIS, ROUTINE W REFLEX MICROSCOPIC
Bilirubin Urine: NEGATIVE
GLUCOSE, UA: NEGATIVE mg/dL
Ketones, ur: NEGATIVE mg/dL
Leukocytes, UA: NEGATIVE
Nitrite: NEGATIVE
Protein, ur: 30 mg/dL — AB
SPECIFIC GRAVITY, URINE: 1.011 (ref 1.005–1.030)
Urobilinogen, UA: 0.2 mg/dL (ref 0.0–1.0)
pH: 5 (ref 5.0–8.0)

## 2014-01-31 LAB — CBC
HCT: 32.4 % — ABNORMAL LOW (ref 39.0–52.0)
HEMOGLOBIN: 10.8 g/dL — AB (ref 13.0–17.0)
MCH: 29 pg (ref 26.0–34.0)
MCHC: 33.3 g/dL (ref 30.0–36.0)
MCV: 87.1 fL (ref 78.0–100.0)
Platelets: 125 10*3/uL — ABNORMAL LOW (ref 150–400)
RBC: 3.72 MIL/uL — ABNORMAL LOW (ref 4.22–5.81)
RDW: 16.1 % — ABNORMAL HIGH (ref 11.5–15.5)
WBC: 7.1 10*3/uL (ref 4.0–10.5)

## 2014-01-31 LAB — URINE MICROSCOPIC-ADD ON

## 2014-01-31 LAB — SURGICAL PCR SCREEN
MRSA, PCR: NEGATIVE
STAPHYLOCOCCUS AUREUS: POSITIVE — AB

## 2014-01-31 LAB — APTT: aPTT: 36 seconds (ref 24–37)

## 2014-01-31 NOTE — Progress Notes (Signed)
CBC, BMET, MICRO, UA RESULTS FAXED BY EPIC TO DR Alvan Dame OFFFICE

## 2014-02-02 NOTE — H&P (Signed)
TOTAL HIP REVISION ADMISSION H&P  Patient is admitted for left  total hip arthroplasty after removal of cannulated screws  Subjective:  Chief Complaint:    Left hip pain and dysfunction s/p cannulated screw fixation.  HPI: Christian Daft., 80 y.o. male, has a history of pain and functional disability in the left hip due to trauma and patient has failed non-surgical conservative treatments for greater than 12 weeks to include NSAID's and/or analgesics, use of assistive devices and activity modification. The indications for the revision total hip arthroplasty are failure or previous hip surgery.  Onset of symptoms was abrupt starting September 2014  with gradually worsening course since that time.  Prior procedures on the left hip include cannulated screw fixation per Dr. Marcelino Scot.  Patient currently rates pain in the left hip at 10 out of 10 with activity.  There is night pain, worsening of pain with activity and weight bearing, trendelenberg gait, pain that interfers with activities of daily living and pain with passive range of motion. Patient has evidence of periarticular osteophytes and joint space narrowing by imaging studies.  This condition presents safety issues increasing the risk of falls. There is no current active infection.   Risks, benefits and expectations were discussed with the patient.  Risks including but not limited to the risk of anesthesia, blood clots, nerve damage, blood vessel damage, failure of the prosthesis, infection and up to and including death.  Patient understand the risks, benefits and expectations and wishes to proceed with surgery.   D/C Plans:   Home with HHPT  Post-op Meds:    No Rx given  Tranexamic Acid:   Not to be given - CAD  Decadron:    Not to be given - DM  FYI:    Oxy post-op   Patient Active Problem List   Diagnosis Date Noted  . Abdominal aneurysm without mention of rupture 09/30/2013  . Urinary retention 08/10/2013  . Hip fracture, left  08/07/2013  . Anemia of chronic disease 08/07/2013  . Borderline diabetes mellitus 01/02/2013  . CKD (chronic kidney disease), stage III 01/02/2013  . Hypothyroid 10/17/2011  . Smokers' cough 10/17/2011  . 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 BPH (benign prostatic hyperplasia) 10/17/2011   Past Medical History  Diagnosis Date  . Hypothyroid   . BPH (benign prostatic hyperplasia)   . Renal insufficiency   . Glucose intolerance (impaired glucose tolerance)   . Complication of anesthesia     "a tough time"  -pt unable to explain  . Hypertension   . Pancreatitis 15-20 yrs ago  . AAA (abdominal aortic aneurysm)   . Peripheral neuropathy     fingers and toes  . Arthritis     Past Surgical History  Procedure Laterality Date  . Abdominal aortic aneurysm repair  july 2011  . Prostate surgery  5 yrs ago    urethral dilation  . Amputation Right 01/03/2013    Procedure: AMPUTATION DIGIT;  Surgeon: Wylene Simmer, MD;  Location: [facility];  Service: Orthopedics;  Laterality: Right;  RIGHT 2ND TOE AMPUTATION  . Hip pinning,cannulated Left 08/08/2013    Procedure: LEFT CANNULATED HIP PINNING;  Surgeon: Rozanna Box, MD;  Location: [facility];  Service: Orthopedics;  Laterality: Left;  . Steriod injection Bilateral 08/08/2013    Procedure: STEROID INJECTION;  Surgeon: Rozanna Box, MD;  Location: [facility];  Service: Orthopedics;  Laterality: Bilateral;    No prescriptions prior to admission   Allergies  Allergen Reactions  .  Aspirin Other (See Comments)    Low bp, stomach pains    History  Substance Use Topics  . Smoking status: Former Smoker -- 0.30 packs/day for 60 years    Quit date: 10/02/2012  . Smokeless tobacco: Never Used  . Alcohol Use: Yes     Comment: occasioally    Family History  Problem Relation Age of Onset  . Heart attack Mother   . Heart disease Mother   . Heart attack Father   . Diabetes Son     amputation-BKA       Review of Systems  Constitutional: Negative.    HENT: Negative.   Eyes: Negative.   Respiratory: Negative.   Cardiovascular: Negative.   Gastrointestinal: Negative.   Genitourinary: Negative.   Musculoskeletal: Positive for joint pain.  Skin: Negative.   Neurological: Negative.   Endo/Heme/Allergies: Negative.   Psychiatric/Behavioral: Negative.     Objective:  Physical Exam  Constitutional: He is oriented to person, place, and time. He appears well-developed.  HENT:  Head: Normocephalic and atraumatic.  Mouth/Throat: Oropharynx is clear and moist.  Eyes: Pupils are equal, round, and reactive to light.  Neck: Neck supple. No JVD present. No tracheal deviation present. No thyromegaly present.  Cardiovascular: Normal rate, regular rhythm, normal heart sounds and intact distal pulses.   Respiratory: Effort normal and breath sounds normal. No respiratory distress. He has no wheezes.  GI: Soft. There is no tenderness. There is no guarding.  Musculoskeletal:       Left hip: He exhibits decreased range of motion, decreased strength, tenderness, bony tenderness and laceration (healed). He exhibits no swelling and no deformity.  Lymphadenopathy:    He has no cervical adenopathy.  Neurological: He is alert and oriented to person, place, and time.  Skin: Skin is warm and dry.  Psychiatric: He has a normal mood and affect.    Labs:  Estimated body mass index is 23.91 kg/(m^2) as calculated from the following:   Height as of 09/30/13: 5\' 9"  (1.753 m).   Weight as of 09/30/13: 73.483 kg (162 lb).  Imaging Review:  Plain radiographs demonstrate severe degenerative joint disease of the left hip(s). There is evidence of failure of the 4 cannulated screws.The bone quality appears to be fair for age and reported activity level.   Assessment/Plan:  End stage arthritis, left hip(s) with failed previous arthroplasty.  The patient history, physical examination, clinical judgement of the provider and imaging studies are consistent with  end stage degenerative joint disease of the left hip(s), previous hip surgery. Revision total hip arthroplasty is deemed medically necessary. The treatment options including medical management, injection therapy, arthroscopy and arthroplasty were discussed at length. The risks and benefits of total hip arthroplasty were presented and reviewed. The risks due to aseptic loosening, infection, stiffness, dislocation/subluxation,  thromboembolic complications and other imponderables were discussed.  The patient acknowledged the explanation, agreed to proceed with the plan and consent was signed. Patient is being admitted for inpatient treatment for surgery, pain control, PT, OT, prophylactic antibiotics, VTE prophylaxis, progressive ambulation and ADL's and discharge planning. The patient is planning to be discharged home with home health services.      West Pugh Odesser Tourangeau   PAC  02/02/2014, 2:52 PM

## 2014-02-03 NOTE — Progress Notes (Signed)
Called RX in For Mupirocin ointment 2%/ 22 gm tube with no refills- instructions to apply to each nostril bid x 10 doses.  Left message pts cell phone and asked for confirmation call from him

## 2014-02-06 ENCOUNTER — Encounter (INDEPENDENT_AMBULATORY_CARE_PROVIDER_SITE_OTHER): Payer: Medicare Other | Admitting: Ophthalmology

## 2014-02-07 NOTE — Progress Notes (Signed)
Spoke with pt, pt aware arrive 1010am, [facility] short stay npo aftre midnight

## 2014-02-07 NOTE — Progress Notes (Addendum)
Left pt message surgery time changed to 1240, npo after midnight arrive 1010 am 02-10-14 [facility] short stay

## 2014-02-10 ENCOUNTER — Encounter (HOSPITAL_COMMUNITY)
Admission: RE | Disposition: A | Discharge status: Home Health | Payer: Self-pay | Source: Ambulatory Visit | Attending: Orthopedic Surgery

## 2014-02-10 ENCOUNTER — Inpatient Hospital Stay (HOSPITAL_COMMUNITY): Payer: Medicare Other | Admitting: *Deleted

## 2014-02-10 ENCOUNTER — Inpatient Hospital Stay (HOSPITAL_COMMUNITY): Payer: Medicare Other

## 2014-02-10 ENCOUNTER — Encounter (HOSPITAL_COMMUNITY): Payer: Self-pay

## 2014-02-10 ENCOUNTER — Inpatient Hospital Stay (HOSPITAL_COMMUNITY)
Admission: RE | Admit: 2014-02-10 | Discharge: 2014-02-14 | DRG: 469 | Disposition: A | Discharge status: Home Health | Payer: Medicare Other | Source: Ambulatory Visit | Attending: Orthopedic Surgery | Admitting: Orthopedic Surgery

## 2014-02-10 ENCOUNTER — Encounter (HOSPITAL_COMMUNITY): Payer: Medicare Other | Admitting: *Deleted

## 2014-02-10 DIAGNOSIS — Y831 Surgical operation with implant of artificial internal device as the cause of abnormal reaction of the patient, or of later complication, without mention of misadventure at the time of the procedure: Secondary | ICD-10-CM | POA: Diagnosis present

## 2014-02-10 DIAGNOSIS — J310 Chronic rhinitis: Secondary | ICD-10-CM | POA: Diagnosis present

## 2014-02-10 DIAGNOSIS — D638 Anemia in other chronic diseases classified elsewhere: Secondary | ICD-10-CM | POA: Diagnosis present

## 2014-02-10 DIAGNOSIS — Z87891 Personal history of nicotine dependence: Secondary | ICD-10-CM

## 2014-02-10 DIAGNOSIS — S72009A Fracture of unspecified part of neck of unspecified femur, initial encounter for closed fracture: Secondary | ICD-10-CM | POA: Diagnosis present

## 2014-02-10 DIAGNOSIS — D500 Iron deficiency anemia secondary to blood loss (chronic): Secondary | ICD-10-CM | POA: Diagnosis not present

## 2014-02-10 DIAGNOSIS — Z886 Allergy status to analgesic agent status: Secondary | ICD-10-CM

## 2014-02-10 DIAGNOSIS — D62 Acute posthemorrhagic anemia: Secondary | ICD-10-CM | POA: Diagnosis not present

## 2014-02-10 DIAGNOSIS — E039 Hypothyroidism, unspecified: Secondary | ICD-10-CM | POA: Diagnosis present

## 2014-02-10 DIAGNOSIS — Z96649 Presence of unspecified artificial hip joint: Secondary | ICD-10-CM

## 2014-02-10 DIAGNOSIS — M1610 Unilateral primary osteoarthritis, unspecified hip: Secondary | ICD-10-CM | POA: Diagnosis present

## 2014-02-10 DIAGNOSIS — I714 Abdominal aortic aneurysm, without rupture, unspecified: Secondary | ICD-10-CM | POA: Diagnosis present

## 2014-02-10 DIAGNOSIS — Z01812 Encounter for preprocedural laboratory examination: Secondary | ICD-10-CM

## 2014-02-10 DIAGNOSIS — M62838 Other muscle spasm: Secondary | ICD-10-CM | POA: Diagnosis not present

## 2014-02-10 DIAGNOSIS — N183 Chronic kidney disease, stage 3 unspecified: Secondary | ICD-10-CM | POA: Diagnosis present

## 2014-02-10 DIAGNOSIS — T84099A Other mechanical complication of unspecified internal joint prosthesis, initial encounter: Principal | ICD-10-CM | POA: Diagnosis present

## 2014-02-10 DIAGNOSIS — M169 Osteoarthritis of hip, unspecified: Secondary | ICD-10-CM | POA: Diagnosis present

## 2014-02-10 DIAGNOSIS — I129 Hypertensive chronic kidney disease with stage 1 through stage 4 chronic kidney disease, or unspecified chronic kidney disease: Secondary | ICD-10-CM | POA: Diagnosis present

## 2014-02-10 HISTORY — PX: TOTAL HIP REVISION: SHX763

## 2014-02-10 LAB — GLUCOSE, CAPILLARY: GLUCOSE-CAPILLARY: 111 mg/dL — AB (ref 70–99)

## 2014-02-10 LAB — ABO/RH: ABO/RH(D): O POS

## 2014-02-10 SURGERY — TOTAL HIP REVISION
Anesthesia: Spinal | Site: Hip | Laterality: Left

## 2014-02-10 MED ORDER — LEVOTHYROXINE SODIUM 88 MCG PO TABS
88.0000 ug | ORAL_TABLET | Freq: Every day | ORAL | Status: DC
  Administered 2014-02-11 – 2014-02-14 (×4): 88 ug via ORAL
  Filled 2014-02-10 (×5): qty 1

## 2014-02-10 MED ORDER — KETAMINE HCL 10 MG/ML IJ SOLN
INTRAMUSCULAR | Status: AC
  Filled 2014-02-10: qty 1

## 2014-02-10 MED ORDER — FLEET ENEMA 7-19 GM/118ML RE ENEM: 1.0000 | ENEMA | Freq: Once | RECTAL | Status: AC | PRN

## 2014-02-10 MED ORDER — LIDOCAINE HCL (CARDIAC) 20 MG/ML IV SOLN
INTRAVENOUS | Status: AC
  Filled 2014-02-10: qty 5

## 2014-02-10 MED ORDER — CELECOXIB 200 MG PO CAPS
200.0000 mg | ORAL_CAPSULE | Freq: Two times a day (BID) | ORAL | Status: DC
  Administered 2014-02-10: 200 mg via ORAL
  Filled 2014-02-10 (×3): qty 1

## 2014-02-10 MED ORDER — LIDOCAINE HCL (CARDIAC) 20 MG/ML IV SOLN
INTRAVENOUS | Status: DC | PRN
  Administered 2014-02-10: 50 mg via INTRAVENOUS

## 2014-02-10 MED ORDER — OXYCODONE HCL 5 MG PO TABS: 5.0000 mg | ORAL_TABLET | Freq: Once | ORAL | Status: DC | PRN

## 2014-02-10 MED ORDER — METOCLOPRAMIDE HCL 5 MG/ML IJ SOLN: 5.0000 mg | Freq: Three times a day (TID) | INTRAMUSCULAR | Status: DC | PRN

## 2014-02-10 MED ORDER — BISACODYL 10 MG RE SUPP: 10.0000 mg | Freq: Every day | RECTAL | Status: DC | PRN

## 2014-02-10 MED ORDER — PHENOL 1.4 % MT LIQD
1.0000 | OROMUCOSAL | Status: DC | PRN
  Filled 2014-02-10: qty 177

## 2014-02-10 MED ORDER — HYDROMORPHONE HCL PF 1 MG/ML IJ SOLN
INTRAMUSCULAR | Status: AC
  Filled 2014-02-10: qty 1

## 2014-02-10 MED ORDER — PROPOFOL 10 MG/ML IV BOLUS
INTRAVENOUS | Status: AC
  Filled 2014-02-10: qty 20

## 2014-02-10 MED ORDER — DEXAMETHASONE SODIUM PHOSPHATE 10 MG/ML IJ SOLN
10.0000 mg | Freq: Once | INTRAMUSCULAR | Status: DC
  Filled 2014-02-10: qty 1

## 2014-02-10 MED ORDER — ONDANSETRON HCL 4 MG/2ML IJ SOLN
INTRAMUSCULAR | Status: AC
  Filled 2014-02-10: qty 2

## 2014-02-10 MED ORDER — CEFAZOLIN SODIUM-DEXTROSE 2-3 GM-% IV SOLR
2.0000 g | Freq: Four times a day (QID) | INTRAVENOUS | Status: AC
  Administered 2014-02-10 – 2014-02-11 (×2): 2 g via INTRAVENOUS
  Filled 2014-02-10 (×2): qty 50

## 2014-02-10 MED ORDER — BUPIVACAINE HCL (PF) 0.5 % IJ SOLN
INTRAMUSCULAR | Status: DC | PRN
  Administered 2014-02-10: 3 mL

## 2014-02-10 MED ORDER — FENTANYL CITRATE 0.05 MG/ML IJ SOLN
INTRAMUSCULAR | Status: AC
  Filled 2014-02-10: qty 5

## 2014-02-10 MED ORDER — ONDANSETRON HCL 4 MG/2ML IJ SOLN: 4.0000 mg | Freq: Four times a day (QID) | INTRAMUSCULAR | Status: DC | PRN

## 2014-02-10 MED ORDER — PHENYLEPHRINE HCL 10 MG/ML IJ SOLN
INTRAMUSCULAR | Status: DC | PRN
  Administered 2014-02-10: 120 ug via INTRAVENOUS

## 2014-02-10 MED ORDER — LACTATED RINGERS IV SOLN
INTRAVENOUS | Status: DC
  Administered 2014-02-10 (×2): 1000 mL via INTRAVENOUS
  Administered 2014-02-10: 14:00:00 via INTRAVENOUS

## 2014-02-10 MED ORDER — MENTHOL 3 MG MT LOZG
1.0000 | LOZENGE | OROMUCOSAL | Status: DC | PRN
  Filled 2014-02-10 (×2): qty 9

## 2014-02-10 MED ORDER — DIPHENHYDRAMINE HCL 25 MG PO CAPS: 25.0000 mg | ORAL_CAPSULE | Freq: Four times a day (QID) | ORAL | Status: DC | PRN

## 2014-02-10 MED ORDER — OXYCODONE HCL 5 MG/5ML PO SOLN
5.0000 mg | Freq: Once | ORAL | Status: DC | PRN
  Filled 2014-02-10: qty 5

## 2014-02-10 MED ORDER — KETAMINE HCL 10 MG/ML IJ SOLN
INTRAMUSCULAR | Status: DC | PRN
  Administered 2014-02-10 (×2): 10 mg via INTRAVENOUS

## 2014-02-10 MED ORDER — DOCUSATE SODIUM 100 MG PO CAPS
100.0000 mg | ORAL_CAPSULE | Freq: Two times a day (BID) | ORAL | Status: DC
  Administered 2014-02-10 – 2014-02-12 (×3): 100 mg via ORAL
  Filled 2014-02-10: qty 1

## 2014-02-10 MED ORDER — ALUM & MAG HYDROXIDE-SIMETH 200-200-20 MG/5ML PO SUSP: 30.0000 mL | ORAL | Status: DC | PRN

## 2014-02-10 MED ORDER — CEFAZOLIN SODIUM-DEXTROSE 2-3 GM-% IV SOLR
INTRAVENOUS | Status: AC
  Filled 2014-02-10: qty 50

## 2014-02-10 MED ORDER — POLYETHYLENE GLYCOL 3350 17 G PO PACK
17.0000 g | PACK | Freq: Two times a day (BID) | ORAL | Status: DC
  Administered 2014-02-10: 17 g via ORAL
  Filled 2014-02-10: qty 1

## 2014-02-10 MED ORDER — HYDROMORPHONE HCL PF 1 MG/ML IJ SOLN
0.2500 mg | INTRAMUSCULAR | Status: DC | PRN
  Administered 2014-02-10 (×4): 0.5 mg via INTRAVENOUS

## 2014-02-10 MED ORDER — ONDANSETRON HCL 4 MG/2ML IJ SOLN
INTRAMUSCULAR | Status: DC | PRN
  Administered 2014-02-10: 4 mg via INTRAVENOUS

## 2014-02-10 MED ORDER — PHENYLEPHRINE HCL 10 MG/ML IJ SOLN
10.0000 mg | INTRAVENOUS | Status: DC | PRN
  Administered 2014-02-10: 50 ug/min via INTRAVENOUS

## 2014-02-10 MED ORDER — ENOXAPARIN SODIUM 40 MG/0.4ML ~~LOC~~ SOLN
40.0000 mg | SUBCUTANEOUS | Status: DC
  Administered 2014-02-11 – 2014-02-12 (×2): 40 mg via SUBCUTANEOUS
  Filled 2014-02-10 (×3): qty 0.4

## 2014-02-10 MED ORDER — TRIAMCINOLONE ACETONIDE 0.1 % EX CREA
1.0000 | TOPICAL_CREAM | Freq: Two times a day (BID) | CUTANEOUS | Status: DC | PRN
Start: 2014-02-10 — End: 2014-02-14
  Filled 2014-02-10: qty 15

## 2014-02-10 MED ORDER — METOCLOPRAMIDE HCL 10 MG PO TABS: 5.0000 mg | ORAL_TABLET | Freq: Three times a day (TID) | ORAL | Status: DC | PRN

## 2014-02-10 MED ORDER — FERROUS SULFATE 325 (65 FE) MG PO TABS
325.0000 mg | ORAL_TABLET | Freq: Three times a day (TID) | ORAL | Status: DC
  Administered 2014-02-10 – 2014-02-14 (×9): 325 mg via ORAL
  Filled 2014-02-10 (×14): qty 1

## 2014-02-10 MED ORDER — HYDROCODONE-ACETAMINOPHEN 7.5-325 MG PO TABS
1.0000 | ORAL_TABLET | ORAL | Status: DC
  Administered 2014-02-10: 2 via ORAL
  Administered 2014-02-10 – 2014-02-11 (×2): 1 via ORAL
  Administered 2014-02-11: 2 via ORAL
  Filled 2014-02-10: qty 1
  Filled 2014-02-10: qty 2
  Filled 2014-02-10: qty 1
  Filled 2014-02-10: qty 2

## 2014-02-10 MED ORDER — SODIUM CHLORIDE 0.9 % IV SOLN
100.0000 mL/h | INTRAVENOUS | Status: DC
  Administered 2014-02-10: 100 mL/h via INTRAVENOUS
  Filled 2014-02-10 (×15): qty 1000

## 2014-02-10 MED ORDER — CEFAZOLIN SODIUM-DEXTROSE 2-3 GM-% IV SOLR
2.0000 g | INTRAVENOUS | Status: AC
  Administered 2014-02-10: 2 g via INTRAVENOUS

## 2014-02-10 MED ORDER — HYDROMORPHONE HCL PF 1 MG/ML IJ SOLN
0.5000 mg | INTRAMUSCULAR | Status: DC | PRN
  Administered 2014-02-11: 0.5 mg via INTRAVENOUS
  Administered 2014-02-11 – 2014-02-12 (×3): 1 mg via INTRAVENOUS
  Filled 2014-02-10 (×5): qty 1

## 2014-02-10 MED ORDER — PROPOFOL INFUSION 10 MG/ML OPTIME
INTRAVENOUS | Status: DC | PRN
Start: 2014-02-10 — End: 2014-02-10
  Administered 2014-02-10: 140 ug/kg/min via INTRAVENOUS

## 2014-02-10 MED ORDER — ONDANSETRON HCL 4 MG PO TABS: 4.0000 mg | ORAL_TABLET | Freq: Four times a day (QID) | ORAL | Status: DC | PRN

## 2014-02-10 MED ORDER — METHOCARBAMOL 100 MG/ML IJ SOLN
500.0000 mg | Freq: Four times a day (QID) | INTRAVENOUS | Status: DC | PRN
  Administered 2014-02-10: 500 mg via INTRAVENOUS
  Filled 2014-02-10: qty 5

## 2014-02-10 MED ORDER — PROMETHAZINE HCL 25 MG/ML IJ SOLN: 6.2500 mg | INTRAMUSCULAR | Status: DC | PRN

## 2014-02-10 MED ORDER — DEXAMETHASONE SODIUM PHOSPHATE 10 MG/ML IJ SOLN
INTRAMUSCULAR | Status: AC
  Filled 2014-02-10: qty 1

## 2014-02-10 MED ORDER — MEPERIDINE HCL 50 MG/ML IJ SOLN: 6.2500 mg | INTRAMUSCULAR | Status: DC | PRN

## 2014-02-10 MED ORDER — 0.9 % SODIUM CHLORIDE (POUR BTL) OPTIME
TOPICAL | Status: DC | PRN
  Administered 2014-02-10: 1000 mL

## 2014-02-10 MED ORDER — METHOCARBAMOL 500 MG PO TABS
500.0000 mg | ORAL_TABLET | Freq: Four times a day (QID) | ORAL | Status: DC | PRN
  Administered 2014-02-10 – 2014-02-14 (×11): 500 mg via ORAL
  Filled 2014-02-10 (×12): qty 1

## 2014-02-10 SURGICAL SUPPLY — 56 items
BAG ZIPLOCK 12X15 (MISCELLANEOUS) ×3 IMPLANT
BLADE SAW SGTL 18X1.27X75 (BLADE) ×2 IMPLANT
BLADE SAW SGTL 18X1.27X75MM (BLADE) ×1
BRUSH FEMORAL CANAL (MISCELLANEOUS) IMPLANT
CAPT HIP PF MOP ×3 IMPLANT
DERMABOND ADVANCED (GAUZE/BANDAGES/DRESSINGS) ×2
DERMABOND ADVANCED .7 DNX12 (GAUZE/BANDAGES/DRESSINGS) ×1 IMPLANT
DRAPE INCISE IOBAN 85X60 (DRAPES) ×3 IMPLANT
DRAPE ORTHO SPLIT 77X108 STRL (DRAPES) ×4
DRAPE POUCH INSTRU U-SHP 10X18 (DRAPES) ×3 IMPLANT
DRAPE SURG 17X11 SM STRL (DRAPES) ×3 IMPLANT
DRAPE SURG ORHT 6 SPLT 77X108 (DRAPES) ×2 IMPLANT
DRAPE U-SHAPE 47X51 STRL (DRAPES) ×3 IMPLANT
DRSG AQUACEL AG ADV 3.5X10 (GAUZE/BANDAGES/DRESSINGS) ×3 IMPLANT
DRSG AQUACEL AG ADV 3.5X14 (GAUZE/BANDAGES/DRESSINGS) IMPLANT
DRSG EMULSION OIL 3X16 NADH (GAUZE/BANDAGES/DRESSINGS) IMPLANT
DRSG MEPILEX BORDER 4X4 (GAUZE/BANDAGES/DRESSINGS) IMPLANT
DRSG MEPILEX BORDER 4X8 (GAUZE/BANDAGES/DRESSINGS) IMPLANT
DRSG TEGADERM 4X4.75 (GAUZE/BANDAGES/DRESSINGS) IMPLANT
DURAPREP 26ML APPLICATOR (WOUND CARE) ×3 IMPLANT
ELECT BLADE TIP CTD 4 INCH (ELECTRODE) ×3 IMPLANT
ELECT REM PT RETURN 9FT ADLT (ELECTROSURGICAL) ×3
ELECTRODE REM PT RTRN 9FT ADLT (ELECTROSURGICAL) ×1 IMPLANT
EVACUATOR 1/8 PVC DRAIN (DRAIN) ×3 IMPLANT
FACESHIELD LNG OPTICON STERILE (SAFETY) ×12 IMPLANT
GAUZE SPONGE 2X2 8PLY STRL LF (GAUZE/BANDAGES/DRESSINGS) IMPLANT
GLOVE BIOGEL PI IND STRL 7.5 (GLOVE) ×1 IMPLANT
GLOVE BIOGEL PI IND STRL 8 (GLOVE) ×1 IMPLANT
GLOVE BIOGEL PI INDICATOR 7.5 (GLOVE) ×2
GLOVE BIOGEL PI INDICATOR 8 (GLOVE) ×2
GLOVE ECLIPSE 8.0 STRL XLNG CF (GLOVE) ×6 IMPLANT
GOWN SPEC L3 XXLG W/TWL (GOWN DISPOSABLE) ×6 IMPLANT
GOWN STRL REUS W/TWL LRG LVL3 (GOWN DISPOSABLE) ×6 IMPLANT
HANDPIECE INTERPULSE COAX TIP (DISPOSABLE)
KIT BASIN OR (CUSTOM PROCEDURE TRAY) ×3 IMPLANT
MANIFOLD NEPTUNE II (INSTRUMENTS) ×3 IMPLANT
NS IRRIG 1000ML POUR BTL (IV SOLUTION) ×6 IMPLANT
PACK TOTAL JOINT (CUSTOM PROCEDURE TRAY) ×3 IMPLANT
POSITIONER SURGICAL ARM (MISCELLANEOUS) ×3 IMPLANT
PRESSURIZER FEMORAL UNIV (MISCELLANEOUS) IMPLANT
SET HNDPC FAN SPRY TIP SCT (DISPOSABLE) IMPLANT
SPONGE GAUZE 2X2 STER 10/PKG (GAUZE/BANDAGES/DRESSINGS)
SPONGE LAP 18X18 X RAY DECT (DISPOSABLE) IMPLANT
SPONGE LAP 4X18 X RAY DECT (DISPOSABLE) IMPLANT
STAPLER VISISTAT 35W (STAPLE) IMPLANT
SUCTION FRAZIER TIP 10 FR DISP (SUCTIONS) ×3 IMPLANT
SUT MNCRL AB 3-0 PS2 18 (SUTURE) ×3 IMPLANT
SUT VIC AB 1 CT1 36 (SUTURE) ×6 IMPLANT
SUT VIC AB 2-0 CT1 27 (SUTURE) ×4
SUT VIC AB 2-0 CT1 TAPERPNT 27 (SUTURE) ×2 IMPLANT
SUT VLOC 180 0 24IN GS25 (SUTURE) ×3 IMPLANT
TOWEL OR 17X26 10 PK STRL BLUE (TOWEL DISPOSABLE) ×6 IMPLANT
TOWER CARTRIDGE SMART MIX (DISPOSABLE) IMPLANT
TRAY FOLEY CATH 14FRSI W/METER (CATHETERS) IMPLANT
TRAY FOLEY CATH 16FRSI W/METER (SET/KITS/TRAYS/PACK) ×3 IMPLANT
WATER STERILE IRR 1500ML POUR (IV SOLUTION) ×3 IMPLANT

## 2014-02-10 NOTE — Anesthesia Preprocedure Evaluation (Addendum)
Anesthesia Evaluation  Patient identified by MRN, date of birth, ID band Patient awake    Reviewed: Allergy & Precautions, H&P , NPO status , Patient's Chart, lab work & pertinent test results  History of Anesthesia Complications Negative for: history of anesthetic complications  Airway Mallampati: II TM Distance: >3 FB Neck ROM: Full    Dental  (+) Dental Advisory Given, Teeth Intact   Pulmonary neg pulmonary ROS, former smoker,  breath sounds clear to auscultation  Pulmonary exam normal       Cardiovascular hypertension, + Peripheral Vascular Disease + dysrhythmias Atrial Fibrillation Rhythm:Regular Rate:Normal     Neuro/Psych negative neurological ROS  negative psych ROS   GI/Hepatic negative GI ROS, Neg liver ROS,   Endo/Other  Hypothyroidism   Renal/GU CRF and Renal InsufficiencyRenal disease     Musculoskeletal negative musculoskeletal ROS (+)   Abdominal   Peds  Hematology  (+) anemia ,   Anesthesia Other Findings   Reproductive/Obstetrics                         Anesthesia Physical  Anesthesia Plan  ASA: III  Anesthesia Plan: Spinal   Post-op Pain Management:    Induction:   Airway Management Planned:   Additional Equipment:   Intra-op Plan:   Post-operative Plan:   Informed Consent: I have reviewed the patients History and Physical, chart, labs and discussed the procedure including the risks, benefits and alternatives for the proposed anesthesia with the patient or authorized representative who has indicated his/her understanding and acceptance.   Dental advisory given  Plan Discussed with: CRNA  Anesthesia Plan Comments:       Anesthesia Quick Evaluation

## 2014-02-10 NOTE — Brief Op Note (Signed)
02/10/2014  3:39 PM  PATIENT:  [name].  80 y.o. male  PRE-OPERATIVE DIAGNOSIS:  FAILED LEFT HIP SURGERY  POST-OPERATIVE DIAGNOSIS:  FAILED LEFT HIP SURGERY  PROCEDURE:  Procedure(s): REMOVAL OF SYNTHESE SCREWS LEFT HIP AND CONVERSION LEFT TOTAL HIP ARTHROPLASTY (Left)  SURGEON:  Surgeon(s) and Role:    * Mauri Pole, MD - Primary  PHYSICIAN ASSISTANT: Danae Orleans, PA-C  ANESTHESIA:   spinal  EBL:  Total I/O In: 1000 [I.V.:1000] Out: 1150 [Urine:800; Blood:350]  BLOOD ADMINISTERED:none  DRAINS: none   LOCAL MEDICATIONS USED:  NONE  SPECIMEN:  No Specimen  DISPOSITION OF SPECIMEN:  N/A  COUNTS:  YES  TOURNIQUET:  * No tourniquets in log *  DICTATION: .Other Dictation: Dictation Number [id number]  PLAN OF CARE: Admit to inpatient   PATIENT DISPOSITION:  PACU - hemodynamically stable.   Delay start of Pharmacological VTE agent (>24hrs) due to surgical blood loss or risk of bleeding: no

## 2014-02-10 NOTE — Anesthesia Procedure Notes (Addendum)
Spinal  Patient location during procedure: OR Start time: 02/10/2014 1:45 PM End time: 02/10/2014 1:48 PM Staffing Anesthesiologist: Nolon Nations R Performed by: anesthesiologist  Preanesthetic Checklist Completed: patient identified, site marked, surgical consent, pre-op evaluation, timeout performed, IV checked, risks and benefits discussed and monitors and equipment checked Spinal Block Patient position: sitting Prep: Betadine Patient monitoring: heart rate, continuous pulse ox and blood pressure Approach: right paramedian Location: L2-3 Injection technique: single-shot Needle Needle type: Quincke  Needle gauge: 22 G Needle length: 9 cm Assessment Sensory level: T8 Additional Notes Expiration date of kit checked and confirmed. Patient tolerated procedure well, without complications.

## 2014-02-10 NOTE — Preoperative (Signed)
Beta Blockers   Reason not to administer Beta Blockers:Not Applicable. Not on home BB

## 2014-02-10 NOTE — Interval H&P Note (Signed)
History and Physical Interval Note:  02/10/2014 1:05 PM  Christian Daft.  has presented today for surgery, with the diagnosis of FAILED LEFT HIP SURGERY  The various methods of treatment have been discussed with the patient and family. After consideration of risks, benefits and other options for treatment, the patient has consented to  Procedure(s): REMOVAL OF SYNTHESE SCREWS LEFT HIP AND CONVERSION LEFT TOTAL HIP ARTHROPLASTY (Left) as a surgical intervention .  The patient's history has been reviewed, patient examined, no change in status, stable for surgery.  I have reviewed the patient's chart and labs.  Questions were answered to the patient's satisfaction.     Mauri Pole

## 2014-02-10 NOTE — Transfer of Care (Signed)
Immediate Anesthesia Transfer of Care Note  Patient: [name].  Procedure(s) Performed: Procedure(s) (LRB): REMOVAL OF SYNTHESE SCREWS LEFT HIP AND CONVERSION LEFT TOTAL HIP ARTHROPLASTY (Left)  Patient Location: PACU  Anesthesia Type: Spinal  Level of Consciousness: sedated, patient cooperative and responds to stimulation  Airway & Oxygen Therapy: Patient Spontanous Breathing and Patient connected to face mask oxgen  Post-op Assessment: Report given to PACU RN and Post -op Vital signs reviewed and stable  Post vital signs: Reviewed and stable  Complications: No apparent anesthesia complications

## 2014-02-10 NOTE — Progress Notes (Signed)
Family not in surgical waiting area.  Message and room number, left on son's phone:  [phone].

## 2014-02-10 NOTE — Anesthesia Postprocedure Evaluation (Signed)
Anesthesia Post Note  Patient: [name].  Procedure(s) Performed: Procedure(s) (LRB): REMOVAL OF SYNTHESE SCREWS LEFT HIP AND CONVERSION LEFT TOTAL HIP ARTHROPLASTY (Left)  Anesthesia type: General  Patient location: PACU  Post pain: Pain level controlled  Post assessment: Post-op Vital signs reviewed  Last Vitals:  Filed Vitals:   02/10/14 1715  BP: 133/62  Pulse: 58  Temp: 36.4 C  Resp: 13    Post vital signs: Reviewed  Level of consciousness: sedated  Complications: No apparent anesthesia complications

## 2014-02-10 NOTE — Progress Notes (Signed)
Utilization review completed.  

## 2014-02-11 ENCOUNTER — Encounter (HOSPITAL_COMMUNITY): Payer: Self-pay | Admitting: Orthopedic Surgery

## 2014-02-11 DIAGNOSIS — D500 Iron deficiency anemia secondary to blood loss (chronic): Secondary | ICD-10-CM | POA: Diagnosis not present

## 2014-02-11 LAB — BASIC METABOLIC PANEL
BUN: 32 mg/dL — ABNORMAL HIGH (ref 6–23)
CALCIUM: 7.4 mg/dL — AB (ref 8.4–10.5)
CO2: 15 mEq/L — ABNORMAL LOW (ref 19–32)
Chloride: 109 mEq/L (ref 96–112)
Creatinine, Ser: 1.92 mg/dL — ABNORMAL HIGH (ref 0.50–1.35)
GFR calc non Af Amer: 31 mL/min — ABNORMAL LOW (ref >90)
GFR, EST AFRICAN AMERICAN: 36 mL/min — AB (ref >90)
GLUCOSE: 181 mg/dL — AB (ref 70–99)
Potassium: 4.8 mEq/L (ref 3.7–5.3)
Sodium: 138 mEq/L (ref 137–147)

## 2014-02-11 LAB — CBC
HCT: 23.5 % — ABNORMAL LOW (ref 39.0–52.0)
HEMOGLOBIN: 8.1 g/dL — AB (ref 13.0–17.0)
MCH: 29.8 pg (ref 26.0–34.0)
MCHC: 34.5 g/dL (ref 30.0–36.0)
MCV: 86.4 fL (ref 78.0–100.0)
Platelets: 95 10*3/uL — ABNORMAL LOW (ref 150–400)
RBC: 2.72 MIL/uL — ABNORMAL LOW (ref 4.22–5.81)
RDW: 16.8 % — AB (ref 11.5–15.5)
WBC: 9.2 10*3/uL (ref 4.0–10.5)

## 2014-02-11 LAB — GLUCOSE, CAPILLARY: GLUCOSE-CAPILLARY: 122 mg/dL — AB (ref 70–99)

## 2014-02-11 MED ORDER — TRAMADOL HCL 50 MG PO TABS
50.0000 mg | ORAL_TABLET | Freq: Two times a day (BID) | ORAL | Status: DC | PRN
  Administered 2014-02-11 (×3): 50 mg via ORAL
  Filled 2014-02-11 (×3): qty 1

## 2014-02-11 MED ORDER — TRAMADOL HCL 50 MG PO TABS: 50.0000 mg | ORAL_TABLET | Freq: Four times a day (QID) | ORAL | Status: DC | PRN

## 2014-02-11 MED ORDER — TAMSULOSIN HCL 0.4 MG PO CAPS
0.4000 mg | ORAL_CAPSULE | Freq: Every day | ORAL | Status: DC
  Administered 2014-02-11 – 2014-02-14 (×4): 0.4 mg via ORAL
  Filled 2014-02-11 (×4): qty 1

## 2014-02-11 MED ORDER — METOCLOPRAMIDE HCL 5 MG/ML IJ SOLN
5.0000 mg | Freq: Three times a day (TID) | INTRAMUSCULAR | Status: DC | PRN
  Filled 2014-02-11: qty 1

## 2014-02-11 MED ORDER — METOCLOPRAMIDE HCL 5 MG PO TABS
5.0000 mg | ORAL_TABLET | Freq: Three times a day (TID) | ORAL | Status: DC | PRN
  Filled 2014-02-11: qty 1

## 2014-02-11 NOTE — Progress Notes (Signed)
   CARE MANAGEMENT NOTE 02/11/2014  Patient:  [name], [name]   Account Number:  [id number]  Date Initiated:  02/11/2014  Documentation initiated by:  [facility]  Subjective/Objective Assessment:   REMOVAL OF SYNTHESE SCREWS LEFT HIP AND CONVERSION LEFT TOTAL HIP ARTHROPLASTY     Action/Plan:   [place]   Anticipated DC Date:  02/13/2014   Anticipated DC Plan:  [place]  CM consult      [facility]  HOME HEALTH   Choice offered to / List presented to:  C-1 Patient        [place] arranged  HH-2 PT      [place]   Status of service:  Completed, signed off Medicare Important Message given?   (If response is "NO", the following Medicare IM given date fields will be blank) Date Medicare IM given:   Date Additional Medicare IM given:    Discharge Disposition:  [place]  Per UR Regulation:    If discussed at Long Length of Stay Meetings, dates discussed:    Comments:  02/11/2014 1250 NCM spoke to pt and offered choice for [facility]. Pt agreeable to [facility] for [facility]. Pt states son is at home to assist with his care. Pt has RW and 3n1 at home. Jonnie Finner RN CCM Case Mgmt phone [phone]

## 2014-02-11 NOTE — Evaluation (Signed)
Occupational Therapy Evaluation Patient Details Name: Suhas Kirkendoll. MRN: [id number] DOB: 1931-02-24 Today's Date: 02/11/2014    History of Present Illness removal screws, conversion to posterior THA on L   Clinical Impression   Pt was admitted for the above surgery. During the evaluation, pt upset about THPs and need for 3:1 commode.  He is concerned about money.  Will follow in acute and continue education with pt and son.      Follow Up Recommendations  Home health OT    Equipment Recommendations  3 in 1 bedside comode (pt is concerned about the cost of this)    Recommendations for Other Services       Precautions / Restrictions Precautions Precautions: Posterior Hip;Fall Restrictions Weight Bearing Restrictions: No      Mobility Bed Mobility Overal bed mobility: Needs Assistance Bed Mobility: Sit to Supine     Supine to sit: Min assist     General bed mobility comments: cues for precautions  Transfers Overall transfer level: Needs assistance Equipment used: Rolling walker (2 wheeled) Transfers: Sit to/from Stand Sit to Stand: Min assist         General transfer comment: cues for precautions and to scoot to edge of chair    Balance                                    ADL         Lower Body Bathing: Moderate assistance;Sit to/from stand Lower Body Dressing: Maximal assistance;Sit to/from stand Toilet Transfer: Minimal assistance;Stand-pivot       General ADL Comments: educated on THPs.  Pt was able to perform adls prior to this surgery and recalled 1/3 thps when I arrived.  He states he is nearly blind and he is concerned about cost of things.  Upset when I told him he'd need a 3;1 commode as he has a standard commode at home.  Son is home a lot with him.  Educated to stand and wipe.  Pt is not sure he will be able to do this. Will practice on next visit.  Pt can complete UB adls with set up.      Vision                      Perception     Praxis      Pertinent Vitals/Pain LLE painful with transitional movements.  Pt did not rate and did not want ice reapplied.  NT is going in and asked her to follow up with pt regarding pain and need for medication as he wanted to see if pain would settle down.       Hand Dominance Right   Extremity/Trunk Assessment Upper Extremity Assessment Upper Extremity Assessment: Overall WFL for tasks assessed (strength grossly 4/5)          Communication Communication Communication: No difficulties   Cognition Arousal/Alertness: Awake/alert Behavior During Therapy: WFL for tasks assessed/performed Overall Cognitive Status: Within Functional Limits for tasks assessed                     General Comments       Exercises      Home Living Family/patient expects to be discharged to:: Private residence Living Arrangements: Children Available Help at Discharge: Family Type of Home: House Home Access: Stairs to enter Technical brewer of Steps: 1 Entrance Stairs-Rails: Right Home Layout:  One level     Bathroom Shower/Tub: Teacher, early years/pre: Standard     Home Equipment: Cane - single point;Walker - 2 wheels          Prior Functioning/Environment Level of Independence: Independent with assistive device(s)             OT Diagnosis:     OT Problem List: Decreased strength;Decreased activity tolerance;Decreased knowledge of use of DME or AE;Decreased knowledge of precautions;Pain   OT Treatment/Interventions: Self-care/ADL training;DME and/or AE instruction;Patient/family education    OT Goals(Current goals can be found in the care plan section) Acute Rehab OT Goals Patient Stated Goal: I want to walk without pain, normally OT Goal Formulation: With patient Time For Goal Achievement: 02/18/14 Potential to Achieve Goals: Good ADL Goals Pt Will Perform Grooming: with supervision;standing Pt Will Transfer to Toilet: with  min guard assist;bedside commode;ambulating Pt Will Perform Toileting - Clothing Manipulation and hygiene: with min guard assist;sit to/from stand Additional ADL Goal #1: pt/son will verbalize precautions and need for assistance (vs. pt independent with AE)  OT Frequency: Min 2X/week   Barriers to D/C:            End of Session:    Activity Tolerance: Patient limited by fatigue Patient left: in bed;with call bell/phone within reach   Time: [id number] OT Time Calculation (min): 31 min Charges:  OT General Charges $OT Visit: 1 Procedure OT Evaluation $Initial OT Evaluation Tier I: 1 Procedure OT Treatments $Self Care/Home Management : 8-22 mins G-Codes:    Jnai Snellgrove 03/12/14, 3:27 PM   Lesle Chris, OTR/L [phone] 2014-03-12

## 2014-02-11 NOTE — Op Note (Signed)
NAMESHERMAR, WISMAN NO.:  [id number]  MEDICAL RECORD NO.:  [id number]  LOCATION:  [id number]                         FACILITY:  [facility]  PHYSICIAN:  Pietro Cassis. Alvan Dame, M.D.  DATE OF BIRTH:  08/04/1931  DATE OF PROCEDURE:  02/10/2014 DATE OF DISCHARGE:                              OPERATIVE REPORT   PREOPERATIVE DIAGNOSIS:  Failed left hip surgery with previous femoral neck fracture, treated with percutaneous cannulated screw fixation.  POSTOPERATIVE DIAGNOSIS:  Failed left hip surgery with previous femoral neck fracture, treated with percutaneous cannulated screw fixation.  PROCEDURE:  Conversion of failed left hip surgery to a left total hip arthroplasty.  Procedure involved removing four cancellous screws as well as a single washer, then replacement with a DePuy size 8 high Tri- Lock stem, 36+ 5 ARTICUL/EZE metal ball, a size 54 pinnacle shell, 36+ 4 neutral AltrX liner.  SURGEON:  Pietro Cassis. Alvan Dame, M.D.  ASSISTANT:  Danae Orleans, PA-C.  Note that Mr. Guinevere Scarlet was present for the entirety of the case from preoperative position, perioperative management of operative extremity, general facilitation of case, and primary wound closure.  ANESTHESIA:  Spinal.  SPECIMENS:  None.  COMPLICATIONS:  None.  BLOOD LOSS:  About 350 mL.  DRAINS:  None.  INDICATIONS FOR PROCEDURE:  Mr. Fluckiger is an 80 year old male who presented for second opinion on surgical evaluation of his left hip.  He had a previous left femoral neck fracture, treated with percutaneous cannula and screw fixation.  He eventually is collapsed into varus with persistent pain and dysfunction.  We discussed the risks and benefits, pros and cons of not addressing this surgically versus surgically replacing the joint.  After reviewing the standard risks of infection, DVT, component failure, need for future revision surgery and dislocation in addition to the risks of continued observation, at this point,  he wished to proceed with total hip arthroplasty.  He wished to remain as active as possible.  He has had a very long recovery process and has been very frustrating as he spent significant amount of time in rehab facility.  Consent was obtained for benefit of pain relief and improve functional capabilities.  PROCEDURE IN DETAIL:  The patient was brought to the operative theater. Once adequate anesthesia, preoperative antibiotics, Ancef administered, he was positioned into the right lateral decubitus position with left side up.  The left lower extremity was then prepped and draped in sterile fashion.  Time-out was performed, identifying the patient, planned procedure, and extremity.  Incision was made over the lateral aspect of the trochanter proximally. Soft tissue dissection was carried down to the iliotibial band and gluteal fascia, which were then incised distally enough to allow for screw removal as well as proximal expose of the hip joint.  I first attended to exposing the hip joint and once the hip was identified and capsulotomy made and debridements around the neck, the hip was dislocated.  I dislocated the hip with the screws in place to prevent any potential complications.  At this point, the head was reduced back into the joint.  The leg was placed into the neutral position along the contralateral side.  At this point,  all four screws were identified and removed including the washer on the distal-most screw.  Once the screws were removed, I dislocated the femoral head without difficulty or complication.  Neck osteotomy was made beginning through the trochanteric fossa towards the lesser trochanter.  At this point, attention was first directed to the femur.  Proximal femur was opened with the drill, hand reamed once to try to open up the canal, confirmed location of the canal and the knee irrigated to try to prevent fat emboli.  I then began broaching with the chili  pepper starting broach then up to a size 8.  The size 8 had good medial and lateral metaphyseal fit.  At this point, I attended to the acetabulum.  Acetabular exposure was obtained.  Foveal and labral tissue debrided.  I began reaming with a 49- mm reamer, reamed up to 53-mm reamer with good bone bed preparation. Minimal reaming was carried out even with this 53 reamer, but the bone preparation was adequate with osteopenic bone appreciated.  This 54 pinnacle shell was then impacted carefully with good initial scratch fit.  A single cancellous screw was placed into the ilium, size 40 and the final 36+ 4 neutral AltrX liner was impacted with good visualized rim fit.  A trial reduction was now carried out with an 8 broach with a high offset neck, 36 +1.5 ball.  I found the hip to be stable throughout the range of motion and the combined anteversion was noted to be about 50 degrees from this posterior approach of the hip.  There was no evidence any impingement with extension and external rotation.  Given these findings, the trial components were removed from the femur.  The final 8 high Tri-Lock stem was selected.  It was impacted and sat basically at the level of the trial.  I did retrial and ended up selecting the 36+ 5 ball to better match leg lengths and improve stability for this 80 year old male with no revision hip surgery.  Following placement of final 36+ 5 ARTICUL/EZE ball, I then irrigated the hip as we had done throughout the case.  I then reapproximated the posterior capsule.  I reapproximated the fascial tissue over the greater trochanter for removal of the four screws.  The iliotibial band and gluteal fascia were then reapproximated using the combination of #1 Vicryl and 0 V-Loc sutures.  The remainder of the wound, at this point, was closed with 2-0 Vicryl and a running 3-0 Monocryl.  The hip was cleaned, dried and dressed sterilely using Dermabond and  Aquacel dressing.  He was then brought to the recovery room in stable condition tolerating the procedure well.    Pietro Cassis Alvan Dame, M.D.    MDO/MEDQ  D:  02/10/2014  T:  02/11/2014  Job:  [id number]

## 2014-02-11 NOTE — Evaluation (Addendum)
Physical Therapy Evaluation Patient Details Name: [name]. MRN: [id number] DOB: 1931/11/14 Today's Date: 02/11/2014   History of Present Illness  removal screws, conversion to posterior THA on L.    Clinical Impression  Pt tolerated well, reports his L leg is not turning out. Pt will benefit from PT to address problems listed. Plans for Dc home. May not have 24/7 caregivers but most of the time per son.   Follow Up Recommendations Home health PT;Supervision/Assistance - 24 hour    Equipment Recommendations  None recommended by PT    Recommendations for Other Services       Precautions / Restrictions Precautions Precautions: Posterior Hip;Fall      Mobility  Bed Mobility Overal bed mobility: Needs Assistance Bed Mobility: Supine to Sit     Supine to sit: Min assist     General bed mobility comments: cues for precautions  Transfers Overall transfer level: Needs assistance Equipment used: Rolling walker (2 wheeled) Transfers: Sit to/from Stand           General transfer comment: cues for precautions  Ambulation/Gait Ambulation/Gait assistance: Min assist Ambulation Distance (Feet): 160 Feet Assistive device: Rolling walker (2 wheeled) Gait Pattern/deviations: Step-to pattern;Antalgic     General Gait Details: cues for sequence and precautions to turn  Stairs            Wheelchair Mobility    Modified Rankin (Stroke Patients Only)       Balance                                     Pertinent Vitals/Pain L hip 4-5, ice applied.    Home Living Family/patient expects to be discharged to:: Private residence Living Arrangements: Children Available Help at Discharge: Family Type of Home: House Home Access: Stairs to enter Entrance Stairs-Rails: Right Entrance Stairs-Number of Steps: 1 Home Layout: One level [place] - single point;Walker - 2 wheels      Prior Function Level of Independence: Independent with  assistive device(s)               Hand Dominance        Extremity/Trunk Assessment   Upper Extremity Assessment: Defer to OT evaluation           Lower Extremity Assessment: LLE deficits/detail   LLE Deficits / Details: decreased control of swing, L knee flexion is limited to about 70 degrees of flexion.     Communication   Communication: No difficulties  Cognition Arousal/Alertness: Awake/alert Behavior During Therapy: WFL for tasks assessed/performed Overall Cognitive Status: Within Functional Limits for tasks assessed                      General Comments      Exercises        Assessment/Plan    PT Assessment Patient needs continued PT services  PT Diagnosis Difficulty walking;Acute pain   PT Problem List Decreased strength;Decreased range of motion;Decreased activity tolerance;Decreased mobility;Decreased knowledge of precautions;Decreased safety awareness;Decreased knowledge of use of DME;Pain  PT Treatment Interventions DME instruction;Gait training;Stair training;Functional mobility training;Therapeutic activities;Therapeutic exercise;Patient/family education   PT Goals (Current goals can be found in the Care Plan section) Acute Rehab PT Goals Patient Stated Goal: I want to walk without pain, normally PT Goal Formulation: With patient/family Time For Goal Achievement: 02/15/14 Potential to Achieve Goals: Good    Frequency 7X/week  Barriers to discharge        End of Session Equipment Utilized During Treatment: Gait belt Activity Tolerance: Patient tolerated treatment well Patient left: in chair;with call bell/phone within reach;with family/visitor present         Time: [id number] PT Time Calculation (min): 26 min   Charges:   PT Evaluation $Initial PT Evaluation Tier I: 1 Procedure PT Treatments $Gait Training: 23-37 mins   PT G Codes:          Claretha Cooper 02/11/2014, 2:25 PM

## 2014-02-11 NOTE — Progress Notes (Signed)
   Subjective: 1 Day Post-Op Procedure(s) (LRB): REMOVAL OF SYNTHESE SCREWS LEFT HIP AND CONVERSION LEFT TOTAL HIP ARTHROPLASTY (Left)   Patient reports pain as mild, pain controlled. No events throughout the night. Appears a little confused this morning, but nurse says he had no signs of confusion last night.  Objective:   VITALS:   Filed Vitals:   02/11/14 0619  BP: 118/59  Pulse:   Temp: 97.9 F (36.6 C)  Resp: 16    Neurovascular intact Dorsiflexion/Plantar flexion intact Incision: dressing C/D/I No cellulitis present Compartment soft  LABS  Recent Labs  02/11/14 0415  HGB 8.1*  HCT 23.5*  WBC 9.2  PLT 95*     Recent Labs  02/11/14 0415  NA 138  K 4.8  BUN 32*  CREATININE 1.92*  GLUCOSE 181*     Assessment/Plan: 1 Day Post-Op Procedure(s) (LRB): REMOVAL OF SYNTHESE SCREWS LEFT HIP AND CONVERSION LEFT TOTAL HIP ARTHROPLASTY (Left) Foley cath d/c'ed Advance diet Up with therapy D/C IV fluids Discharge home with home health eventually, when ready  Expected ABLA  Treated with iron and will observe     [place] Pugh. Ashyla Luth   PAC  02/11/2014, 7:37 AM

## 2014-02-12 LAB — BASIC METABOLIC PANEL WITH GFR
BUN: 38 mg/dL — ABNORMAL HIGH (ref 6–23)
CO2: 14 meq/L — ABNORMAL LOW (ref 19–32)
Calcium: 7.2 mg/dL — ABNORMAL LOW (ref 8.4–10.5)
Chloride: 107 meq/L (ref 96–112)
Creatinine, Ser: 2.11 mg/dL — ABNORMAL HIGH (ref 0.50–1.35)
GFR calc Af Amer: 32 mL/min — ABNORMAL LOW
GFR calc non Af Amer: 28 mL/min — ABNORMAL LOW
Glucose, Bld: 123 mg/dL — ABNORMAL HIGH (ref 70–99)
Potassium: 4.3 meq/L (ref 3.7–5.3)
Sodium: 136 meq/L — ABNORMAL LOW (ref 137–147)

## 2014-02-12 LAB — CBC
HCT: 21.1 % — ABNORMAL LOW (ref 39.0–52.0)
HEMOGLOBIN: 7.2 g/dL — AB (ref 13.0–17.0)
MCH: 29.6 pg (ref 26.0–34.0)
MCHC: 34.1 g/dL (ref 30.0–36.0)
MCV: 86.8 fL (ref 78.0–100.0)
Platelets: 96 10*3/uL — ABNORMAL LOW (ref 150–400)
RBC: 2.43 MIL/uL — ABNORMAL LOW (ref 4.22–5.81)
RDW: 16.9 % — ABNORMAL HIGH (ref 11.5–15.5)
WBC: 6.6 10*3/uL (ref 4.0–10.5)

## 2014-02-12 LAB — PREPARE RBC (CROSSMATCH)

## 2014-02-12 MED ORDER — ACETAMINOPHEN 325 MG PO TABS
325.0000 mg | ORAL_TABLET | Freq: Four times a day (QID) | ORAL | Status: DC | PRN
  Administered 2014-02-12 – 2014-02-13 (×2): 650 mg via ORAL
  Filled 2014-02-12 (×2): qty 2

## 2014-02-12 MED ORDER — HYDROMORPHONE HCL 2 MG PO TABS
2.0000 mg | ORAL_TABLET | ORAL | Status: DC | PRN
  Administered 2014-02-12 – 2014-02-14 (×7): 2 mg via ORAL
  Filled 2014-02-12 (×9): qty 1

## 2014-02-12 MED ORDER — ENOXAPARIN SODIUM 30 MG/0.3ML ~~LOC~~ SOLN
30.0000 mg | SUBCUTANEOUS | Status: DC
  Administered 2014-02-13 – 2014-02-14 (×2): 30 mg via SUBCUTANEOUS
  Filled 2014-02-12 (×3): qty 0.3

## 2014-02-12 NOTE — Progress Notes (Signed)
   Subjective: 2 Days Post-Op Procedure(s) (LRB): REMOVAL OF SYNTHESE SCREWS LEFT HIP AND CONVERSION LEFT TOTAL HIP ARTHROPLASTY (Left)   Patient reports pain as moderate, with muscle spasms. No events throughout the night. Complaining of problems with urination.  Objective:   VITALS:   Filed Vitals:   02/12/14 0522  BP: 182/70  Pulse: 89  Temp: 98 F (36.7 C)  Resp: 18    Neurovascular intact Dorsiflexion/Plantar flexion intact Incision: dressing C/D/I No cellulitis present Compartment soft  LABS  Recent Labs  02/11/14 0415 02/12/14 0413  HGB 8.1* 7.2*  HCT 23.5* 21.1*  WBC 9.2 6.6  PLT 95* 96*     Recent Labs  02/11/14 0415 02/12/14 0413  NA 138 136*  K 4.8 4.3  BUN 32* 38*  CREATININE 1.92* 2.11*  GLUCOSE 181* 123*     Assessment/Plan: 2 Days Post-Op Procedure(s) (LRB): REMOVAL OF SYNTHESE SCREWS LEFT HIP AND CONVERSION LEFT TOTAL HIP ARTHROPLASTY (Left) Up with therapy Discharge home with home health eventually, when ready  ABLA  Will transfuse 2 units of blood and will observe      [place] Pugh. Margert Edsall   PAC  02/12/2014, 9:07 AM

## 2014-02-12 NOTE — Progress Notes (Signed)
Patient reporting severe pain.  Due to renal function ultram is not available to give at this time.  Arlee Muslim PA paged.  New orders received.  Will continue to monitor.

## 2014-02-12 NOTE — Progress Notes (Signed)
OT Cancellation Note  Patient Details Name: [name]. MRN: [id number] DOB: 05-Jun-1931   Cancelled Treatment:    Reason Eval/Treat Not Completed: Other (comment).  Pt is nearing end of unit of blood.  Checked with him and he states that he feels terrible and doesn't want to get OOB this pm.  Will check back tomorrow.    Kharee Lesesne 02/12/2014, 3:12 PM Lesle Chris, OTR/L [phone] 02/12/2014

## 2014-02-12 NOTE — Progress Notes (Addendum)
Following I&O cath at 2300 patient had not voided (no resistance met with I&O cath).  Bladder scan performed reported 600 cc's.  Attempted to place 101 French catheter, met resistance, catheter removed.  Charge nurse attempted to place 16 [place] catheter, also met resistance.  Patient not reporting urge to void or discomfort.  Day shift RN will talk to [facility] PA during 0730 rounds for further orders.

## 2014-02-12 NOTE — Progress Notes (Signed)
PT Cancellation Note  Patient Details Name: [name]. MRN: [id number] DOB: [birth year]   Cancelled Treatment:    Reason Eval/Treat Not Completed: Medical issues which prohibited therapy (2 units of blood today)   Claretha Cooper 02/12/2014, 6:05 PM

## 2014-02-13 LAB — URINALYSIS, ROUTINE W REFLEX MICROSCOPIC
Bilirubin Urine: NEGATIVE
GLUCOSE, UA: NEGATIVE mg/dL
KETONES UR: NEGATIVE mg/dL
LEUKOCYTES UA: NEGATIVE
NITRITE: NEGATIVE
PH: 5 (ref 5.0–8.0)
Protein, ur: NEGATIVE mg/dL
Specific Gravity, Urine: 1.008 (ref 1.005–1.030)
Urobilinogen, UA: 0.2 mg/dL (ref 0.0–1.0)

## 2014-02-13 LAB — TYPE AND SCREEN
ABO/RH(D): O POS
ANTIBODY SCREEN: NEGATIVE
UNIT DIVISION: 0
Unit division: 0

## 2014-02-13 LAB — BASIC METABOLIC PANEL
BUN: 40 mg/dL — ABNORMAL HIGH (ref 6–23)
CHLORIDE: 109 meq/L (ref 96–112)
CO2: 14 mEq/L — ABNORMAL LOW (ref 19–32)
CREATININE: 2.18 mg/dL — AB (ref 0.50–1.35)
Calcium: 7.2 mg/dL — ABNORMAL LOW (ref 8.4–10.5)
GFR calc non Af Amer: 27 mL/min — ABNORMAL LOW (ref >90)
GFR, EST AFRICAN AMERICAN: 31 mL/min — AB (ref >90)
Glucose, Bld: 113 mg/dL — ABNORMAL HIGH (ref 70–99)
Potassium: 3.9 mEq/L (ref 3.7–5.3)
Sodium: 137 mEq/L (ref 137–147)

## 2014-02-13 LAB — CBC
HCT: 25.7 % — ABNORMAL LOW (ref 39.0–52.0)
Hemoglobin: 8.9 g/dL — ABNORMAL LOW (ref 13.0–17.0)
MCH: 30 pg (ref 26.0–34.0)
MCHC: 34.6 g/dL (ref 30.0–36.0)
MCV: 86.5 fL (ref 78.0–100.0)
PLATELETS: 101 10*3/uL — AB (ref 150–400)
RBC: 2.97 MIL/uL — ABNORMAL LOW (ref 4.22–5.81)
RDW: 16.3 % — ABNORMAL HIGH (ref 11.5–15.5)
WBC: 7.8 10*3/uL (ref 4.0–10.5)

## 2014-02-13 LAB — URINE MICROSCOPIC-ADD ON

## 2014-02-13 MED ORDER — SODIUM CHLORIDE 0.9 % IV SOLN
INTRAVENOUS | Status: DC
  Administered 2014-02-13 – 2014-02-14 (×2): via INTRAVENOUS

## 2014-02-13 NOTE — Progress Notes (Signed)
Physical Therapy Treatment Patient Details Name: [name]. MRN: [id number] DOB: Feb 12, 1931 Today's Date: 02/13/2014    History of Present Illness removal screws, conversion to posterior THA on L    PT Comments    Pt tolerated ambulation today., some difficulty advancing L LE at times, pt states sock sticks. Case manager spoke with son who states pt will have 24/7 caregivers, assuredly.   Follow Up Recommendations  Home health PT;Supervision/Assistance - 24 hour     Equipment Recommendations  None recommended by PT    Recommendations for Other Services       Precautions / Restrictions Precautions Precautions: Posterior Hip;Fall    Mobility  Bed Mobility Overal bed mobility: Needs Assistance Bed Mobility: Supine to Sit     Supine to sit: Mod assist     General bed mobility comments: assist  to L for LLE, tends to get stuck in the hole of bed.  Transfers Overall transfer level: Needs assistance Equipment used: Rolling walker (2 wheeled) Transfers: Sit to/from Stand Sit to Stand: Min guard         General transfer comment: cues for LE position  Ambulation/Gait Ambulation/Gait assistance: Min assist Ambulation Distance (Feet): 180 Feet Assistive device: Rolling walker (2 wheeled) Gait Pattern/deviations: Step-to pattern;Step-through pattern;Antalgic     General Gait Details: L foot catches on floor  at swing several times.   Stairs            Wheelchair Mobility    Modified Rankin (Stroke Patients Only)       Balance                                    Cognition Arousal/Alertness: Awake/alert Behavior During Therapy: WFL for tasks assessed/performed                        Exercises      General Comments        Pertinent Vitals/Pain L hip soreness., no dizziness.    Home Living                      Prior Function            PT Goals (current goals can now be found in the care plan  section) Progress towards PT goals: Progressing toward goals    Frequency  7X/week    PT Plan Current plan remains appropriate    End of Session Equipment Utilized During Treatment: Gait belt Activity Tolerance: Patient tolerated treatment well Patient left: in chair;with call bell/phone within reach     Time: 1037-1102 PT Time Calculation (min): 25 min  Charges:  $Gait Training: 23-37 mins                    G Codes:      Claretha Cooper 02/13/2014, 4:12 PM

## 2014-02-13 NOTE — Plan of Care (Signed)
Problem: Consults Goal: Diagnosis- Total Joint Replacement Outcome: Completed/Met Date Met:  02/13/14 Primary Total Hip LEFT

## 2014-02-13 NOTE — Progress Notes (Signed)
   CARE MANAGEMENT NOTE 02/13/2014  Patient:  [name], [name]   Account Number:  [id number]  Date Initiated:  02/11/2014  Documentation initiated by:  [facility]  Subjective/Objective Assessment:   REMOVAL OF SYNTHESE SCREWS LEFT HIP AND CONVERSION LEFT TOTAL HIP ARTHROPLASTY     Action/Plan:   [place]   Anticipated DC Date:  02/13/2014   Anticipated DC Plan:  [place]  CM consult      [facility] Choice  HOME HEALTH   Choice offered to / List presented to:  C-1 Patient        [place] arranged  HH-2 PT      [place]   Status of service:  Completed, signed off Medicare Important Message given?   (If response is "NO", the following Medicare IM given date fields will be blank) Date Medicare IM given:   Date Additional Medicare IM given:    Discharge Disposition:  [place]  Per UR Regulation:    If discussed at Long Length of Stay Meetings, dates discussed:    Comments:  02/13/2014 1100 NCM spoke to pt and states he does not want to go to SNF. His deductible is something he cannot afford. States his son lives with him and he will able to assist him at home. NCM explained he will need 24 hour supervision while at home for safety. Gave permission to speak to son, Cecilie Lowers [phone] or Lennette Bihari, # [phone]. Contacted Lennette Bihari and states he and his wife will assist Cecilie Lowers at home with pt to provide 24 hour care. Spoke to [place] and states they will work out a plan for pt to have adequate care at home. Explained to son dc is planned for tomorrow. Jonnie Finner RN CCM Case Mgmt phone [phone]  02/11/2014 1250 NCM spoke to pt and offered choice for [facility]. Pt agreeable to [facility] for [facility]. Pt states son is at home to assist with his care. Pt has RW and 3n1 at home. Jonnie Finner RN CCM Case Mgmt phone [phone]

## 2014-02-13 NOTE — Progress Notes (Signed)
Patient ID: [name]., male   DOB: [birth year], 80 y.o.   MRN: [id number] Subjective: 3 Days Post-Op Procedure(s) (LRB): REMOVAL OF SYNTHESE SCREWS LEFT HIP AND CONVERSION LEFT TOTAL HIP ARTHROPLASTY (Left)    Patient reports pain as mild.  Major issue has been cramp involving the quad and adductor muscles, periodically  Objective:   VITALS:   Filed Vitals:   02/13/14 0525  BP: 145/66  Pulse: 69  Temp: 98.7 F (37.1 C)  Resp: 16    Neurovascular intact Incision: dressing C/D/I  LABS  Recent Labs  02/11/14 0415 02/12/14 0413 02/13/14 0413  HGB 8.1* 7.2* 8.9*  HCT 23.5* 21.1* 25.7*  WBC 9.2 6.6 7.8  PLT 95* 96* 101*     Recent Labs  02/11/14 0415 02/12/14 0413 02/13/14 0413  NA 138 136* 137  K 4.8 4.3 3.9  BUN 32* 38* 40*  CREATININE 1.92* 2.11* 2.18*  GLUCOSE 181* 123* 113*    No results found for this basename: LABPT, INR,  in the last 72 hours   Assessment/Plan: 3 Days Post-Op Procedure(s) (LRB): REMOVAL OF SYNTHESE SCREWS LEFT HIP AND CONVERSION LEFT TOTAL HIP ARTHROPLASTY (Left)   Up with therapy Plan for discharge tomorrow Discharge home with home health  I would like to have him work with therapy today since we missed yesterday due to blood transfusion Hgb 8.9 after 2 units PRBCs - repeat CBC in am  Renal function slightly increase in Cr - will monitor with repeat BMET in am Maintain IVF at 50cc/hr for now for volume purposes

## 2014-02-13 NOTE — Progress Notes (Signed)
Occupational Therapy Treatment Patient Details Name: [name]. MRN: [id number] DOB: [birth year] Today's Date: 02/13/2014    History of present illness removal screws, conversion to posterior THA on L   OT comments  Pt needs a lot of reinforcement with thps and safety.  Needs 24/7 assistance/supervision  Follow Up Recommendations  Supervision/Assistance - 24 hour;Home health OT (refuses SNF)    Equipment Recommendations  3 in 1 bedside comode    Recommendations for Other Services      Precautions / Restrictions Precautions Precautions: Posterior Hip;Fall       Mobility Bed Mobility                  Transfers     Transfers: Sit to/from Stand Sit to Stand: Min guard         General transfer comment: cues for LE position    Balance                                   ADL           Lower Body Dressing: Moderate assistance;With adaptive equipment;Sit to/from stand (started pants and performed sock with reacher and sock aide) Toilet Transfer: Minimal assistance;Ambulation;BSC       General ADL Comments: cues for no internal rotation.  Pt needs reinforcement with thps--understands some parts but doesn't really understand that this surgery was different than last one.  Will need 24/7  Refuses snf.  Pt very unsafe in bathroom, abandoning walker and reaching for wall, 3:1 to get through tight space.  He recalls that he sidestepped in the past      Vision                     Perception     Praxis      Cognition   Behavior During Therapy: [facility] for tasks assessed/performed Overall Cognitive Status: Within Functional Limits for tasks assessed                       Extremity/Trunk Assessment               Exercises       General Comments      Pertinent Vitals/ Pain       L hip sore; premedicated and repositioned  Home Living                                          Prior  Functioning/Environment              Frequency Min 2X/week     Progress Toward Goals  OT Goals(current goals can now be found in the care plan section)  Progress towards OT goals: Progressing toward goals     Plan      End of Session    Activity Tolerance Patient tolerated treatment well   Patient Left in chair;with call bell/phone within reach   Nurse Communication          Time: [id number] OT Time Calculation (min): 26 min  Charges: OT General Charges $OT Visit: 1 Procedure OT Treatments $Self Care/Home Management : 23-37 mins  Ninetta Adelstein 02/13/2014, 11:50 AM Lesle Chris, OTR/L [phone] 02/13/2014

## 2014-02-13 NOTE — Progress Notes (Signed)
Physical Therapy Treatment Patient Details Name: [name]. MRN: [id number] DOB: 1931/09/13 Today's Date: 02/13/2014    History of Present Illness removal screws, conversion to posterior THA on L    PT Comments    Gait is slowly improving. Possible DC tomorrow.   Follow Up Recommendations  Home health PT;Supervision/Assistance - 24 hour     Equipment Recommendations  None recommended by PT    Recommendations for Other Services       Precautions / Restrictions Precautions Precautions: Posterior Hip;Fall    Mobility  Bed Mobility Overal bed mobility: Needs Assistance Bed Mobility: Sit to Supine     Supine to sit: Mod assist Sit to supine: Mod assist   General bed mobility comments: assist  to L for LLE,  onto bed, cues for precautions  Transfers Overall transfer level: Needs assistance Equipment used: Rolling walker (2 wheeled) Transfers: Sit to/from Stand Sit to Stand: Min guard         General transfer comment: cues for LE position  Ambulation/Gait Ambulation/Gait assistance: Min assist Ambulation Distance (Feet): 200 Feet Assistive device: Rolling walker (2 wheeled) Gait Pattern/deviations: Step-to pattern;Step-through pattern;Antalgic     General Gait Details: improved R swing after RW height increased   Stairs            Wheelchair Mobility    Modified Rankin (Stroke Patients Only)       Balance                                    Cognition Arousal/Alertness: Awake/alert Behavior During Therapy: WFL for tasks assessed/performed                        Exercises      General Comments        Pertinent Vitals/Pain Sore hip, ice applied    Home Living                      Prior Function            PT Goals (current goals can now be found in the care plan section) Progress towards PT goals: Progressing toward goals    Frequency  7X/week    PT Plan Current plan remains  appropriate    End of Session Equipment Utilized During Treatment: Gait belt Activity Tolerance: Patient tolerated treatment well Patient left: in bed;with call bell/phone within reach     Time: 1354-1411 PT Time Calculation (min): 17 min  Charges:  $Gait Training: 8-22 mins                    G Codes:      Claretha Cooper 02/13/2014, 4:16 PM

## 2014-02-13 NOTE — Progress Notes (Addendum)
Physical Therapy Treatment Patient Details Name: Roko Rayan. MRN: [id number] DOB: Jun 10, 1931 Today's Date: 02/13/2014    History of Present Illness removal screws, conversion to posterior THA on L    PT Comments    Pt having much difficulty with flexion  Of L hip in supine. Pt reports spasms occur frequently. Instructed in using leg lifter- left one for pt to try . Info in obtaining one left for Pt at the bedside. Pt needs to practice bed mobility in AM with lifter.  Follow Up Recommendations  Home health PT;Supervision/Assistance - 24 hour     Equipment Recommendations  None recommended by PT    Recommendations for Other Services       Precautions / Restrictions Precautions Precautions: Posterior Hip;Fall    Mobility    Balance   Cognition   Exercises Attempted Heel slides and abduction with sheet,  and ankle pumps x 10 reps, spasms limiting hip flexion  General Comments    Pertinent Vitals/Pain 10 when spasms hit.    Home Living                      Prior Function            PT Goals (current goals can now be found in the care plan section) Progress towards PT goals: Progressing toward goals    Frequency  7X/week    PT Plan Current plan remains appropriate    End of Session Equipment Utilized During Treatment: Gait belt Activity Tolerance: Patient tolerated treatment well Patient left: in bed;with call bell/phone within reach     Time: [id number] PT Time Calculation (min): 23 min  Charges:  $Gait Training: 8-22 mins $Therapeutic Exercise: 23-37 mins                    G Codes:      Claretha Cooper 02/13/2014, 5:16 PM

## 2014-02-14 LAB — CBC
HEMATOCRIT: 24.7 % — AB (ref 39.0–52.0)
Hemoglobin: 8.5 g/dL — ABNORMAL LOW (ref 13.0–17.0)
MCH: 29.5 pg (ref 26.0–34.0)
MCHC: 34.4 g/dL (ref 30.0–36.0)
MCV: 85.8 fL (ref 78.0–100.0)
Platelets: 109 10*3/uL — ABNORMAL LOW (ref 150–400)
RBC: 2.88 MIL/uL — ABNORMAL LOW (ref 4.22–5.81)
RDW: 16.3 % — AB (ref 11.5–15.5)
WBC: 7.9 10*3/uL (ref 4.0–10.5)

## 2014-02-14 LAB — BASIC METABOLIC PANEL
BUN: 39 mg/dL — ABNORMAL HIGH (ref 6–23)
CHLORIDE: 109 meq/L (ref 96–112)
CO2: 16 mEq/L — ABNORMAL LOW (ref 19–32)
Calcium: 7.2 mg/dL — ABNORMAL LOW (ref 8.4–10.5)
Creatinine, Ser: 1.96 mg/dL — ABNORMAL HIGH (ref 0.50–1.35)
GFR calc non Af Amer: 30 mL/min — ABNORMAL LOW (ref >90)
GFR, EST AFRICAN AMERICAN: 35 mL/min — AB (ref >90)
Glucose, Bld: 157 mg/dL — ABNORMAL HIGH (ref 70–99)
POTASSIUM: 3.9 meq/L (ref 3.7–5.3)
SODIUM: 139 meq/L (ref 137–147)

## 2014-02-14 MED ORDER — ENOXAPARIN (LOVENOX) PATIENT EDUCATION KIT
PACK | Freq: Once | Status: AC
  Administered 2014-02-14: 13:00:00
  Filled 2014-02-14: qty 1

## 2014-02-14 MED ORDER — FERROUS SULFATE 325 (65 FE) MG PO TABS: 325.0000 mg | ORAL_TABLET | Freq: Three times a day (TID) | ORAL | Status: DC

## 2014-02-14 MED ORDER — POLYETHYLENE GLYCOL 3350 17 G PO PACK: 17.0000 g | PACK | Freq: Two times a day (BID) | ORAL | Status: DC

## 2014-02-14 MED ORDER — SULFAMETHOXAZOLE-TRIMETHOPRIM 400-80 MG PO TABS
1.0000 | ORAL_TABLET | Freq: Two times a day (BID) | ORAL | Status: DC
Start: 2014-02-14 — End: 2014-05-01

## 2014-02-14 MED ORDER — TIZANIDINE HCL 4 MG PO TABS: 4.0000 mg | ORAL_TABLET | Freq: Four times a day (QID) | ORAL | Status: DC | PRN

## 2014-02-14 MED ORDER — DSS 100 MG PO CAPS: 100.0000 mg | ORAL_CAPSULE | Freq: Two times a day (BID) | ORAL | Status: DC

## 2014-02-14 MED ORDER — ENOXAPARIN SODIUM 30 MG/0.3ML ~~LOC~~ SOLN: 30.0000 mg | SUBCUTANEOUS | Status: DC

## 2014-02-14 MED ORDER — OXYCODONE HCL 5 MG PO TABS: 5.0000 mg | ORAL_TABLET | Freq: Four times a day (QID) | ORAL | Status: DC | PRN

## 2014-02-14 NOTE — Progress Notes (Addendum)
Pt's final decision is for home, will not reconsider SNF placement. Home Health RN, SW and OT added to [facility] services. [facility] aware. Jonnie Finner RN CCM Case Mgmt phone [phone]

## 2014-02-14 NOTE — Discharge Summary (Signed)
Physician Discharge Summary  Patient ID: [name]. MRN: [id number] DOB/AGE: [birth year] 80 y.o.  Admit date: 02/10/2014 Discharge date:  02/14/2014  Procedures:  Procedure(s) (LRB): REMOVAL OF SYNTHESE SCREWS LEFT HIP AND CONVERSION LEFT TOTAL HIP ARTHROPLASTY (Left)  Attending Physician:  Dr. Paralee Cancel   Admission Diagnoses:   Left hip pain and dysfunction s/p cannulated screw fixation  Discharge Diagnoses:  Principal Problem:   S/P left hip revision Active Problems:   Blood loss anemia  Past Medical History  Diagnosis Date  . Hypothyroid   . BPH (benign prostatic hyperplasia)   . Renal insufficiency   . Glucose intolerance (impaired glucose tolerance)   . Complication of anesthesia     "a tough time"  -pt unable to explain  . Hypertension   . Pancreatitis 15-20 yrs ago  . AAA (abdominal aortic aneurysm)   . Peripheral neuropathy     fingers and toes  . Arthritis     HPI: [name]., 80 y.o. male, has a history of pain and functional disability in the left hip due to trauma and patient has failed non-surgical conservative treatments for greater than 12 weeks to include NSAID's and/or analgesics, use of assistive devices and activity modification. The indications for the revision total hip arthroplasty are failure or previous hip surgery. Onset of symptoms was abrupt starting September 2014 with gradually worsening course since that time. Prior procedures on the left hip include cannulated screw fixation per Dr. Marcelino Scot. Patient currently rates pain in the left hip at 10 out of 10 with activity. There is night pain, worsening of pain with activity and weight bearing, trendelenberg gait, pain that interfers with activities of daily living and pain with passive range of motion. Patient has evidence of periarticular osteophytes and joint space narrowing by imaging studies. This condition presents safety issues increasing the risk of falls. There is no current active  infection. Risks, benefits and expectations were discussed with the patient. Risks including but not limited to the risk of anesthesia, blood clots, nerve damage, blood vessel damage, failure of the prosthesis, infection and up to and including death. Patient understand the risks, benefits and expectations and wishes to proceed with surgery.  PCP: Wyatt Haste, MD   Discharged Condition: good  Hospital Course:  Patient underwent the above stated procedure on 02/10/2014. Patient tolerated the procedure well and brought to the recovery room in good condition and subsequently to the floor.  POD #1 BP: 118/59 ; Pulse: 54 ; Temp: 97.9 F (36.6 C) ; Resp: 16  Patient reports pain as mild, pain controlled. No events throughout the night. Appears a little confused this morning, but nurse says he had no signs of confusion last night. Neurovascular intact, dorsiflexion/plantar flexion intact, incision: dressing C/D/I, no cellulitis present and compartment soft.   LABS  Basename    HGB  8.1  HCT  23.5   POD #2  BP: 182/70 ; Pulse: 89 ; Temp: 98 F (36.7 C) ; Resp: 18  Patient reports pain as moderate, with muscle spasms. No events throughout the night. Complaining of problems with urination.  Do to symptomatic anemia he received 2 units of blood. Neurovascular intact, dorsiflexion/plantar flexion intact, incision: dressing C/D/I, no cellulitis present and compartment soft.   LABS  Basename    HGB  7.2  HCT  21.1   POD #3  BP: 145/66 ; Pulse: 69 ; Temp: 98.7 F (37.1 C) ; Resp: 16  Patient reports pain as mild.  Major issue has been cramp involving the quad and adductor muscles, periodically Neurovascular intact, dorsiflexion/plantar flexion intact, incision: dressing C/D/I, no cellulitis present and compartment soft.   LABS  Basename    HGB  8.9  HCT  25.7   POD #4 BP: 145/64 ; Pulse: 57 ; Temp: 99.3 F (37.4 C) ; Resp: 16 Patient reports pain as moderate. Still dealing with  spasms.  Foley still in place due to retention. Refusing SNF, and ready to be discharged home. Neurovascular intact, dorsiflexion/plantar flexion intact, incision: dressing C/D/I, no cellulitis present and compartment soft.   LABS  Basename    HGB  8.5  HCT  24.7    Discharge Exam: General appearance: alert, cooperative and no distress Extremities: Homans sign is negative, no sign of DVT, no edema, redness or tenderness in the calves or thighs and no ulcers, gangrene or trophic changes  Disposition:    Home or Self Care with follow up in 2 weeks   Follow-up Information   Follow up with [facility])    Contact information:   [address] [place]  [address] [phone]       Follow up with Mauri Pole, MD. Schedule an appointment as soon as possible for a visit in 2 weeks.   Specialty:  Orthopedic Surgery   Contact information:   [address] [place] [address] [phone]       Follow up with Urologist. Schedule an appointment as soon as possible for a visit in 1 week. (Contact your urologist and state that you still have a foley cath and need to follow up)        Future Appointments Provider Department Dept Phone   10/06/2014 10:00 AM [facility] [place] [phone]   Eat a light meal the night before the exam. Nothing to eat or drink for at least 8 hours before exam. No gum chewing, or smoking the morning of the exam. Please take your morning medications with small sips of water, especially blood pressure medication *Very Important* Please wear 2 piece clothing   10/06/2014 11:00 AM Sharmon Leyden Nickel, NP [facility]      Discharge Orders   Future Appointments Provider Department Dept Phone   10/06/2014 10:00 AM [facility] [place] [phone]   Eat a light meal the night before the  exam. Nothing to eat or drink for at least 8 hours before exam. No gum chewing, or smoking the morning of the exam. Please take your morning medications with small sips of water, especially blood pressure medication *Very Important* Please wear 2 piece clothing   10/06/2014 11:00 AM Viann Fish, NP [facility] [phone]   Future Orders Complete By Expires   Call MD / Call 911  As directed    Comments:     If you experience chest pain or shortness of breath, CALL 911 and be transported to the hospital emergency room.  If you develope a fever above 101 F, pus (white drainage) or increased drainage or redness at the wound, or calf pain, call your surgeon's office.   Change dressing  As directed    Comments:     Maintain surgical dressing for 10-14 days, or until follow up in the clinic.   Constipation Prevention  As directed    Comments:     Drink plenty of fluids.  Prune  juice may be helpful.  You may use a stool softener, such as Colace (over the counter) 100 mg twice a day.  Use MiraLax (over the counter) for constipation as needed.   Diet - low sodium heart healthy  As directed    Discharge instructions  As directed    Comments:     Maintain surgical dressing for 10-14 days, or until follow up in the clinic. Follow up in 2 weeks at [facility]. Call with any questions or concerns.   Driving restrictions  As directed    Comments:     No driving for 4 weeks   Increase activity slowly as tolerated  As directed    TED hose  As directed    Comments:     Use stockings (TED hose) for 2 weeks on both leg(s).  You may remove them at night for sleeping.   Weight bearing as tolerated  As directed    Questions:     Laterality:     Extremity:          Medication List         DSS 100 MG Caps  Take 100 mg by mouth 2 (two) times daily.     DUREZOL 0.05 % Emul  Generic drug:  Difluprednate  Injection every 6 wks into eye     enoxaparin 30  MG/0.3ML injection  Commonly known as:  LOVENOX  Inject 0.3 mLs (30 mg total) into the skin daily.     EYE VITAMINS PO  Take 4 tablets by mouth daily.     ferrous sulfate 325 (65 FE) MG tablet  Take 1 tablet (325 mg total) by mouth 3 (three) times daily after meals.     levothyroxine 88 MCG tablet  Commonly known as:  SYNTHROID, LEVOTHROID  Take 1 tablet (88 mcg total) by mouth every morning.     magnesium oxide 400 MG tablet  Commonly known as:  MAG-OX  Take 400 mg by mouth daily.     oxyCODONE 5 MG immediate release tablet  Commonly known as:  Oxy IR/ROXICODONE  Take 1-2 tablets (5-10 mg total) by mouth every 6 (six) hours as needed for severe pain.     polyethylene glycol packet  Commonly known as:  MIRALAX / GLYCOLAX  Take 17 g by mouth 2 (two) times daily.     sulfamethoxazole-trimethoprim 400-80 MG per tablet  Commonly known as:  BACTRIM  Take 1 tablet by mouth 2 (two) times daily.     tiZANidine 4 MG tablet  Commonly known as:  ZANAFLEX  Take 1 tablet (4 mg total) by mouth every 6 (six) hours as needed for muscle spasms.     triamcinolone cream 0.1 %  Commonly known as:  KENALOG  Apply 1 application topically 2 (two) times daily as needed (itching).     vitamin D (CHOLECALCIFEROL) 400 UNITS tablet  Take 800 Units by mouth daily.        Signed: West Pugh. Demaris Leavell   PAC  02/14/2014, 7:56 AM

## 2014-02-14 NOTE — Progress Notes (Signed)
Patient ID: Christian Daft., male   DOB: [birth year], 80 y.o.   MRN: [id number] Subjective: 4 Days Post-Op Procedure(s) (LRB): REMOVAL OF SYNTHESE SCREWS LEFT HIP AND CONVERSION LEFT TOTAL HIP ARTHROPLASTY (Left)    Patient reports pain as moderate.  Still dealing with spasms Foley still in place due to retention  Objective:   VITALS:   Filed Vitals:   02/14/14 0622  BP: 145/64  Pulse: 57  Temp: 99.3 F (37.4 C)  Resp: 16    Neurovascular intact Incision: dressing C/D/I  LABS  Recent Labs  02/12/14 0413 02/13/14 0413 02/14/14 0400  HGB 7.2* 8.9* 8.5*  HCT 21.1* 25.7* 24.7*  WBC 6.6 7.8 7.9  PLT 96* 101* 109*     Recent Labs  02/12/14 0413 02/13/14 0413 02/14/14 0400  NA 136* 137 139  K 4.3 3.9 3.9  BUN 38* 40* 39*  CREATININE 2.11* 2.18* 1.96*  GLUCOSE 123* 113* 157*    No results found for this basename: LABPT, INR,  in the last 72 hours   Assessment/Plan: 4 Days Post-Op Procedure(s) (LRB): REMOVAL OF SYNTHESE SCREWS LEFT HIP AND CONVERSION LEFT TOTAL HIP ARTHROPLASTY (Left)   Discharge home with home health  Renal function improved Slow progress Refusing SNF Plan to go home with family to assist Will need Urology follow up Septra daily while foley in place

## 2014-02-14 NOTE — Progress Notes (Signed)
Occupational Therapy Treatment Patient Details Name: Ayce Bombara. MRN: [id number] DOB: [birth year] Today's Date: 02/14/2014    History of present illness removal screws, conversion to posterior THA on L   OT comments  Focus of session was on family education.  Pt ready for discharge   Follow Up Recommendations  Supervision/Assistance - 24 hour;Home health OT    Equipment Recommendations  3 in 1 bedside comode    Recommendations for Other Services      Precautions / Restrictions Precautions Precautions: Posterior Hip;Fall Precaution Booklet Issued: Yes (comment) Precaution Comments: Reviewed all THP; Pt recalls 2/3 without cues Restrictions Weight Bearing Restrictions: No Other Position/Activity Restrictions: WBAT       Mobility Bed Mobility Overal bed mobility: Needs Assistance            Transfers Overall transfer level: Needs assistance Equipment used: Rolling walker (2 wheeled)  Sit to Stand: Supervision             Balance                                   ADL                     General ADL Comments: Educated pt's son on THPs and adl implications.  Son verbalizes understanding of needing to help his father.  Pt does not have a 3:1 commode and says he won't take it unless it is paid for.  He has a tub transfer bench:  reviewed modification of leaning backwards when lifting leg over ledge.  Recommended that son wait for Beulah to practice with him.  Pt's chair/couch surfaces tend to be low; educated need for surfaces to be at least as high as back of knee (when compressed to lowest point).  Son will build up surfaces with blankets.  Pt is ready to discharge.      Vision                     Perception     Praxis      Cognition   Behavior During Therapy: WFL for tasks assessed/performed Overall Cognitive Status: Within Functional Limits for tasks assessed                       Extremity/Trunk  Assessment               Exercises      General Comments      Pertinent Vitals/ Pain       Pt did not voice pain:  Sitting in chair during session  Home Living                                          Prior Functioning/Environment              Frequency       Progress Toward Goals  OT Goals(current goals can now be found in the care plan section)  Progress towards OT goals: Progressing toward goals  Acute Rehab OT Goals Patient Stated Goal: I want to walk without pain, normally  Plan      End of Session    Activity Tolerance     Patient Left in chair   Nurse Communication  Time:  [id number] 8 minutes    Charges: OT General Charges $OT Visit: 1 Procedure OT Treatments $Self Care/Home Management : 8-22 mins  Minha Fulco 02/14/2014, 2:05 PM Lesle Chris, OTR/L [phone] 02/14/2014

## 2014-02-14 NOTE — Progress Notes (Signed)
Physical Therapy Treatment Patient Details Name: [name]. MRN: [id number] DOB: [birth year] Today's Date: 02/14/2014    History of Present Illness removal screws, conversion to posterior THA on L    PT Comments    Pt progressing to sup level with mobility tasks.  RN states spoke to son who says 24/7 assist is available  Follow Up Recommendations  Home health PT;Supervision/Assistance - 24 hour     Equipment Recommendations  None recommended by PT    Recommendations for Other Services       Precautions / Restrictions Precautions Precautions: Posterior Hip;Fall Precaution Booklet Issued: Yes (comment) Precaution Comments: Reviewed all THP; Pt recalls 2/3 without cues Restrictions Weight Bearing Restrictions: No Other Position/Activity Restrictions: WBAT    Mobility  Bed Mobility Overal bed mobility: Needs Assistance Bed Mobility: Supine to Sit;Sit to Supine     Supine to sit: Supervision Sit to supine: Supervision   General bed mobility comments: cues for sequencing, use of leg lifter and use or R LE to self assist  Transfers Overall transfer level: Needs assistance Equipment used: Rolling walker (2 wheeled) Transfers: Sit to/from Stand Sit to Stand: Supervision         General transfer comment: cues for LE position  Ambulation/Gait Ambulation/Gait assistance: Supervision Ambulation Distance (Feet): 200 Feet Assistive device: Rolling walker (2 wheeled) Gait Pattern/deviations: Step-to pattern;Step-through pattern;Decreased step length - right;Decreased step length - left;Shuffle;Trunk flexed     General Gait Details: min cues for sequencing and position from RW.  Improvement in heel contact and R LE swing with increased distance ambulated   Stairs Stairs: Yes Stairs assistance: Min assist Stair Management: No rails;Step to pattern;Forwards;Backwards;With walker Number of Stairs: 3 General stair comments: 3 single steps, 1 bkwd and 2 fwd with  min assist for balance/support and min cues for RW/foot placement and sequence  Wheelchair Mobility    Modified Rankin (Stroke Patients Only)       Balance                                    Cognition Arousal/Alertness: Awake/alert Behavior During Therapy: WFL for tasks assessed/performed Overall Cognitive Status: Within Functional Limits for tasks assessed                      Exercises Total Joint Exercises Ankle Circles/Pumps: AROM;Left;10 reps;Supine Heel Slides: AAROM;Left;10 reps;Supine Hip ABduction/ADduction: AAROM;Left;10 reps    General Comments        Pertinent Vitals/Pain Premed, min hip pain but 8/10 thigh muscle pain.    Home Living                      Prior Function            PT Goals (current goals can now be found in the care plan section) Acute Rehab PT Goals Patient Stated Goal: I want to walk without pain, normally PT Goal Formulation: With patient/family Time For Goal Achievement: 02/15/14 Potential to Achieve Goals: Good Progress towards PT goals: Progressing toward goals    Frequency  7X/week    PT Plan Current plan remains appropriate    End of Session Equipment Utilized During Treatment: Gait belt Activity Tolerance: Patient tolerated treatment well Patient left: in chair;with call bell/phone within reach     Time: 1018-1105 PT Time Calculation (min): 47 min  Charges:  $Gait Training: 8-22 mins $Therapeutic Exercise: 8-22  mins $Therapeutic Activity: 8-22 mins                    G Codes:      [name] 2014/02/26, 12:47 PM

## 2014-02-17 ENCOUNTER — Telehealth: Payer: Self-pay | Admitting: Family Medicine

## 2014-02-17 NOTE — Telephone Encounter (Signed)
Pt notified of his appt with [facility] on 03/03/14 at 2:30 with Dr. Louis Meckel

## 2014-02-17 NOTE — Telephone Encounter (Signed)
Go ahead and set this up

## 2014-02-17 NOTE — Discharge Summary (Deleted)
Physician Discharge Summary  Patient ID: [name]. MRN: [id number] DOB/AGE: [birth year] 80 y.o.  Admit date: 02/10/2014 Discharge date: 02/14/2014   Procedures:  Procedure(s) (LRB): REMOVAL OF SYNTHESE SCREWS LEFT HIP AND CONVERSION LEFT TOTAL HIP ARTHROPLASTY (Left)  Attending Physician:  Dr. Paralee Cancel   Admission Diagnoses:   Left hip pain and dysfunction s/p cannulated screw fixation  Discharge Diagnoses:  Principal Problem:   S/P left hip revision Active Problems:   Blood loss anemia  Past Medical History  Diagnosis Date  . Hypothyroid   . BPH (benign prostatic hyperplasia)   . Renal insufficiency   . Glucose intolerance (impaired glucose tolerance)   . Complication of anesthesia     "a tough time"  -pt unable to explain  . Hypertension   . Pancreatitis 15-20 yrs ago  . AAA (abdominal aortic aneurysm)   . Peripheral neuropathy     fingers and toes  . Arthritis     HPI: [name]., 80 y.o. male, has a history of pain and functional disability in the left hip due to trauma and patient has failed non-surgical conservative treatments for greater than 12 weeks to include NSAID's and/or analgesics, use of assistive devices and activity modification. The indications for the revision total hip arthroplasty are failure or previous hip surgery. Onset of symptoms was abrupt starting September 2014 with gradually worsening course since that time. Prior procedures on the left hip include cannulated screw fixation per Dr. Marcelino Scot. Patient currently rates pain in the left hip at 10 out of 10 with activity. There is night pain, worsening of pain with activity and weight bearing, trendelenberg gait, pain that interfers with activities of daily living and pain with passive range of motion. Patient has evidence of periarticular osteophytes and joint space narrowing by imaging studies. This condition presents safety issues increasing the risk of falls. There is no current active  infection. Risks, benefits and expectations were discussed with the patient. Risks including but not limited to the risk of anesthesia, blood clots, nerve damage, blood vessel damage, failure of the prosthesis, infection and up to and including death. Patient understand the risks, benefits and expectations and wishes to proceed with surgery.   PCP: Wyatt Haste, MD   Discharged Condition: good  Hospital Course:  Patient underwent the above stated procedure on 02/10/2014. Patient tolerated the procedure well and brought to the recovery room in good condition and subsequently to the floor.  POD #1 BP: 118/59 ; Pulse: ; Temp: 97.9 F (36.6 C) ; Resp: 16  Patient reports pain as mild, pain controlled. No events throughout the night. Appears a little confused this morning, but nurse says he had no signs of confusion last night. Neurovascular intact, dorsiflexion/plantar flexion intact, incision: dressing C/D/I, no cellulitis present and compartment soft.   LABS  Basename    HGB  8.1  HCT  23.5   POD #2  BP: 182/70 ; Pulse: 89 ; Temp: 98 F (36.7 C) ; Resp: 18 Patient reports pain as moderate, with muscle spasms. No events throughout the night. Complaining of problems with urination.  Symptomatic anemia and received 2 units of blood. Neurovascular intact, dorsiflexion/plantar flexion intact, incision: dressing C/D/I, no cellulitis present and compartment soft.   LABS  Basename    HGB  7.2  HCT  21.1   POD #3  BP: 145/66 ; Pulse: 69 ; Temp: 98.7 F (37.1 C) ; Resp: 16  Patient reports pain as mild. Major issue has  been cramp involving the quad and adductor muscles, periodically.  Feels better after receiving blood yesterday. Neurovascular intact, dorsiflexion/plantar flexion intact, incision: dressing C/D/I, no cellulitis present and compartment soft.   LABS  Basename    HGB  8.9  HCT  25.7   POD #4  BP: 145/64 ; Pulse: 57 ; Temp: 99.3 F (37.4 C) ; Resp: 16 Patient reports  pain as moderate. Still dealing with spasms.  Foley still in place due to retention. Sent home with fllow and instructed to follow up with a urologist in a week.  Give antibiotics to be on while he has the foley in place.  Neurovascular intact, dorsiflexion/plantar flexion intact, incision: dressing C/D/I, no cellulitis present and compartment soft.    LABS  Basename    HGB  8.5  HCT  24.7    Discharge Exam: General appearance: alert, cooperative and no distress Extremities: Homans sign is negative, no sign of DVT, no edema, redness or tenderness in the calves or thighs and no ulcers, gangrene or trophic changes  Disposition:      Home or Self Care with follow up in 2 weeks   Follow-up Information   Follow up with [facility]. (Home Health Physical Therapy, Occupational Therapy, and RN)    Contact information:   [address] [place] [address] [phone]       Follow up with Mauri Pole, MD. Schedule an appointment as soon as possible for a visit in 2 weeks.   Specialty:  Orthopedic Surgery   Contact information:   [address] [place] [address] [phone]       Follow up with Urologist. Schedule an appointment as soon as possible for a visit in 1 week. (Contact your urologist and state that you still have a foley cath and need to follow up)       Discharge Orders   Future Appointments Provider Department Dept Phone   10/06/2014 10:00 AM [facility] [place] [phone]   Eat a light meal the night before the exam. Nothing to eat or drink for at least 8 hours before exam. No gum chewing, or smoking the morning of the exam. Please take your morning medications with small sips of water, especially blood pressure medication *Very Important* Please wear 2 piece clothing   10/06/2014 11:00 AM Viann Fish, NP [facility] [phone]   Future Orders Complete By  Expires   Call MD / Call 911  As directed    Comments:     If you experience chest pain or shortness of breath, CALL 911 and be transported to the hospital emergency room.  If you develope a fever above 101 F, pus (white drainage) or increased drainage or redness at the wound, or calf pain, call your surgeon's office.   Change dressing  As directed    Comments:     Maintain surgical dressing for 10-14 days, or until follow up in the clinic.   Constipation Prevention  As directed    Comments:     Drink plenty of fluids.  Prune juice may be helpful.  You may use a stool softener, such as Colace (over the counter) 100 mg twice a day.  Use MiraLax (over the counter) for constipation as needed.   Diet - low sodium heart healthy  As directed    Discharge instructions  As directed    Comments:     Maintain surgical dressing  for 10-14 days, or until follow up in the clinic. Follow up in 2 weeks at [facility]. Call with any questions or concerns.   Driving restrictions  As directed    Comments:     No driving for 4 weeks   Increase activity slowly as tolerated  As directed    TED hose  As directed    Comments:     Use stockings (TED hose) for 2 weeks on both leg(s).  You may remove them at night for sleeping.   Weight bearing as tolerated  As directed    Questions:     Laterality:     Extremity:          Medication List         DSS 100 MG Caps  Take 100 mg by mouth 2 (two) times daily.     DUREZOL 0.05 % Emul  Generic drug:  Difluprednate  Injection every 6 wks into eye     enoxaparin 30 MG/0.3ML injection  Commonly known as:  LOVENOX  Inject 0.3 mLs (30 mg total) into the skin daily.     EYE VITAMINS PO  Take 4 tablets by mouth daily.     ferrous sulfate 325 (65 FE) MG tablet  Take 1 tablet (325 mg total) by mouth 3 (three) times daily after meals.     levothyroxine 88 MCG tablet  Commonly known as:  SYNTHROID, LEVOTHROID  Take 1 tablet (88 mcg total) by mouth  every morning.     magnesium oxide 400 MG tablet  Commonly known as:  MAG-OX  Take 400 mg by mouth daily.     oxyCODONE 5 MG immediate release tablet  Commonly known as:  Oxy IR/ROXICODONE  Take 1-2 tablets (5-10 mg total) by mouth every 6 (six) hours as needed for severe pain.     polyethylene glycol packet  Commonly known as:  MIRALAX / GLYCOLAX  Take 17 g by mouth 2 (two) times daily.     sulfamethoxazole-trimethoprim 400-80 MG per tablet  Commonly known as:  BACTRIM  Take 1 tablet by mouth 2 (two) times daily.     tiZANidine 4 MG tablet  Commonly known as:  ZANAFLEX  Take 1 tablet (4 mg total) by mouth every 6 (six) hours as needed for muscle spasms.     triamcinolone cream 0.1 %  Commonly known as:  KENALOG  Apply 1 application topically 2 (two) times daily as needed (itching).     vitamin D (CHOLECALCIFEROL) 400 UNITS tablet  Take 800 Units by mouth daily.         Signed: [place]. Raihan Kimmel   PAC  02/17/2014, 7:53 AM

## 2014-03-12 ENCOUNTER — Ambulatory Visit (INDEPENDENT_AMBULATORY_CARE_PROVIDER_SITE_OTHER): Payer: Medicare Other | Admitting: Family Medicine

## 2014-03-12 ENCOUNTER — Encounter: Payer: Self-pay | Admitting: Family Medicine

## 2014-03-12 VITALS — BP 128/60 | HR 60 | Wt 172.0 lb

## 2014-03-12 DIAGNOSIS — L89890 Pressure ulcer of other site, unstageable: Secondary | ICD-10-CM

## 2014-03-12 DIAGNOSIS — L8995 Pressure ulcer of unspecified site, unstageable: Secondary | ICD-10-CM

## 2014-03-12 DIAGNOSIS — L89899 Pressure ulcer of other site, unspecified stage: Secondary | ICD-10-CM

## 2014-03-12 NOTE — Progress Notes (Signed)
   Subjective:    Patient ID: Christian Daft., male    DOB: 19-Jun-1931, 80 y.o.   MRN: [id number]  HPI  Mr. Christian Daft. is a very pleasant 80 y.o. yo male who  has a past medical history of Hypothyroid; BPH (benign prostatic hyperplasia); Renal insufficiency; Glucose intolerance (impaired glucose tolerance); Complication of anesthesia; Hypertension; Pancreatitis (15-20 yrs ago); AAA (abdominal aortic aneurysm); Peripheral neuropathy; and Arthritis. He presents today for pressure sore on bottom of foot  The patient states that he has pain in both feet for approximately a month, but he is unsure of the onset. The patient is unable to characterize the pain, stating that "it just hurts" and he doesn't know why. The patient thinks that the pain comes and goes but does not think that there is any correlation with time of day or activity. The patient does not remember when the pain started or if there was any trauma associated with it. The patient's son thinks that the patient developed a pressure sore on his left heel from repeatedly trying to stand up. The patient reports that he thinks his feet may be bleeding but is unsure as he is  The patient has not and does not want to try any pain medications for management of his foot pain because he does not believe in them. Of note, the patient had his second toe on his right foot amputated one year ago due to an infection. The patient also relates that he has had a pressure sore on his right side and thinks he may now have one on the left. The patient denies any fever, chills or weight loss.   Review of Systems is negative except per HPI.    Objective:   Physical Exam  LE: On physical exam, the patient has a 5 cm by 5 cm round dark red/black lesion on his left heel. The area is firm, warm, with red tense borders. The area is non-tender (patient has neuropathy)  With evidence of recent bleeding. BIllateral 2+ pitting edema noted bilaterally.       Assessment & Plan:   Decubitus ulcer of dorsum of foot, unstageable  Will refer the patient for immediate evaluation by Dr. Doran Durand. Appointment today at 1:30 pm.

## 2014-03-20 ENCOUNTER — Encounter (HOSPITAL_BASED_OUTPATIENT_CLINIC_OR_DEPARTMENT_OTHER): Payer: Medicare Other | Attending: Internal Medicine

## 2014-03-20 DIAGNOSIS — I872 Venous insufficiency (chronic) (peripheral): Secondary | ICD-10-CM | POA: Insufficient documentation

## 2014-03-20 DIAGNOSIS — L97309 Non-pressure chronic ulcer of unspecified ankle with unspecified severity: Secondary | ICD-10-CM | POA: Insufficient documentation

## 2014-03-24 NOTE — Progress Notes (Signed)
[facility]  NAME:  [name], RUETHER NO.:  MEDICAL RECORD NO.:  [id number]      DATE OF BIRTH:  10-09-1931  PHYSICIAN:  Ricard Dillon, M.D.      VISIT DATE:                                  OFFICE VISIT   [name] is here for our review, courtesy of Dr. __________.  He is primary patient of Dr. Jill Alexanders.  HISTORY:  This is a pleasant 80 year old man who was hospitalized from February 10, 2014, through February 14, 2014, who had revision of a previous surgery on his left hip, removal of screws and conversion to a left total hip arthroplasty.  He apparently tolerated this procedure well and was sent home.  I do not see any reference, scanning the chart, to any particular difficulty with his heel.  In any case, roughly 3 weeks ago, we are not really certain of the exact date, it was noted apparently by a home health nurse that there was a pressure wound on the left heel. The patient saw Dr. Redmond School on March 12, 2014.  He was referred to Dr. __________ and then sent here.  PAST MEDICAL HISTORY:  Includes hypothyroidism, BPH, renal insufficiency, __________ "glucose intolerance __________hypertension, pancreatitis remotely, history of peripheral neuropathy, arthritis.  PAST SURGICAL HISTORY:  Includes removal of the right second toe roughly a year ago.  He has also had an abdominal aortic aneurysm repair.  He had a proximal vascular evaluation in November of 2014, this showed that his proximal vascular supply was actually quite adequate.  MEDICATIONS:  Include vitamin D 400 International Units daily, Lovenox 30 mg daily, ferrous sulfate 325 t.i.d., Synthroid 88 daily, magnesium oxide 400 daily, oxycodone 5 q.6 p.r.n., Bactrim 400/80 b.i.d., Zanaflex 4 mg q.6 h. p.r.n.  PHYSICAL EXAMINATION:  VITAL SIGNS:  Temperature is 97.7, pulse 67, respirations 16, blood pressure __________, weight is 165 pounds with a height of 5 feet 9  inches. RESPIRATORY:  Clear air entry bilaterally. CARDIAC:  Heart sounds were normal.  No murmurs were noted. VASCULAR ASSESSMENT:  We could not do ABIs in the clinic as his vessels were noncompressible; however, I had a lot of difficulty feeling pulses in the popliteal, dorsalis pedis, or posterior tibial.  His capillary refill time was probably borderline. WOUND EXAM:  The area in question is in the left posterior heel, it is an unstageable wound covered by an eschar measuring 3.3 x 3.2.  There is no evidence of surrounding infection, nor was there any particular tenderness, nevertheless this is a very significant area.  IMPRESSIONS:  Unstageable pressure ulcer to the left heel.  We dressed this with a Santyl base dressing for now with an occlusive cover heel foam.  I gave him a pressure offloading boot.  The boot will protect the heel.  I think he should be able to ambulate in this as well.  We will phone the orders in to [place] to change this once.  We have also [facility] for noninvasive Doppler evaluation of the blood flow to his foot and lower leg.  This should include ABIs, arterial Dopplers, and a toe brachial pressure.  Based on this information, we  will know whether it is safe to go ahead with debridement of this area versus having a vascular surgeon evaluate him before this would be necessary.  We will see him again in a week's time.          ______________________________ Ricard Dillon, M.D.     MGR/MEDQ  D:  03/20/2014  T:  03/21/2014  Job:  [id number]

## 2014-03-27 ENCOUNTER — Other Ambulatory Visit (HOSPITAL_BASED_OUTPATIENT_CLINIC_OR_DEPARTMENT_OTHER): Payer: Self-pay | Admitting: Internal Medicine

## 2014-03-27 ENCOUNTER — Ambulatory Visit (HOSPITAL_COMMUNITY)
Admission: RE | Admit: 2014-03-27 | Discharge: 2014-03-27 | Disposition: A | Discharge status: Home / Self Care | Payer: Medicare Other | Source: Ambulatory Visit | Attending: Surgery | Admitting: Surgery

## 2014-03-27 ENCOUNTER — Encounter (HOSPITAL_BASED_OUTPATIENT_CLINIC_OR_DEPARTMENT_OTHER): Payer: Medicare Other | Attending: Internal Medicine

## 2014-03-27 DIAGNOSIS — L89609 Pressure ulcer of unspecified heel, unspecified stage: Secondary | ICD-10-CM | POA: Insufficient documentation

## 2014-03-27 DIAGNOSIS — IMO0002 Reserved for concepts with insufficient information to code with codable children: Secondary | ICD-10-CM

## 2014-03-27 DIAGNOSIS — L98499 Non-pressure chronic ulcer of skin of other sites with unspecified severity: Secondary | ICD-10-CM | POA: Insufficient documentation

## 2014-03-27 DIAGNOSIS — L8995 Pressure ulcer of unspecified site, unstageable: Secondary | ICD-10-CM | POA: Insufficient documentation

## 2014-04-24 ENCOUNTER — Encounter (HOSPITAL_BASED_OUTPATIENT_CLINIC_OR_DEPARTMENT_OTHER): Payer: Medicare Other | Attending: Internal Medicine

## 2014-04-24 DIAGNOSIS — L8993 Pressure ulcer of unspecified site, stage 3: Secondary | ICD-10-CM | POA: Insufficient documentation

## 2014-04-24 DIAGNOSIS — L89609 Pressure ulcer of unspecified heel, unspecified stage: Secondary | ICD-10-CM | POA: Insufficient documentation

## 2014-04-24 DIAGNOSIS — E119 Type 2 diabetes mellitus without complications: Secondary | ICD-10-CM | POA: Insufficient documentation

## 2014-04-25 ENCOUNTER — Encounter: Payer: Self-pay | Admitting: Surgery

## 2014-04-28 ENCOUNTER — Encounter: Payer: Self-pay | Admitting: Surgery

## 2014-04-28 ENCOUNTER — Ambulatory Visit (INDEPENDENT_AMBULATORY_CARE_PROVIDER_SITE_OTHER): Payer: Medicare Other | Admitting: Surgery

## 2014-04-28 ENCOUNTER — Other Ambulatory Visit: Payer: Self-pay

## 2014-04-28 VITALS — BP 151/66 | HR 59 | Ht 69.0 in | Wt 166.0 lb

## 2014-04-28 DIAGNOSIS — L98499 Non-pressure chronic ulcer of skin of other sites with unspecified severity: Principal | ICD-10-CM

## 2014-04-28 DIAGNOSIS — I739 Peripheral vascular disease, unspecified: Secondary | ICD-10-CM

## 2014-04-28 DIAGNOSIS — I7025 Atherosclerosis of native arteries of other extremities with ulceration: Secondary | ICD-10-CM | POA: Insufficient documentation

## 2014-04-28 NOTE — Progress Notes (Signed)
Patient name: [name]. MRN: [id number] DOB: 1931/10/23 Sex: male     Chief Complaint  Patient presents with  . Re-evaluation    Lt foot ischemia    HISTORY OF PRESENT ILLNESS: The patient is here today for evaluation of a left foot wound.  He is well known to me, having undergone open abdominal aortic aneurysm repair on 05/19/2010.  He was not a candidate for endovascular repair given his short infrarenal neck.  The patient now reports a left heel wound for approximately one month.  This is being treated at the wound center.  Recent arterial evaluation showed calcified vessels with monophasic waveforms.  He is getting compression wrap for swelling and dressing changes.  The patient suffers from renal insufficiency.  His most recent creatinine was 1.9.  He is medically managed for hypertension.  He is a former smoker.  Past Medical History  Diagnosis Date  . Hypothyroid   . BPH (benign prostatic hyperplasia)   . Renal insufficiency   . Glucose intolerance (impaired glucose tolerance)   . Complication of anesthesia     "a tough time"  -pt unable to explain  . Hypertension   . Pancreatitis 15-20 yrs ago  . AAA (abdominal aortic aneurysm)   . Peripheral neuropathy     fingers and toes  . Arthritis     Past Surgical History  Procedure Laterality Date  . Abdominal aortic aneurysm repair  july 2011  . Prostate surgery  5 yrs ago    urethral dilation  . Amputation Right 01/03/2013    Procedure: AMPUTATION DIGIT;  Surgeon: Wylene Simmer, MD;  Location: [facility];  Service: Orthopedics;  Laterality: Right;  RIGHT 2ND TOE AMPUTATION  . Hip pinning,cannulated Left 08/08/2013    Procedure: LEFT CANNULATED HIP PINNING;  Surgeon: Rozanna Box, MD;  Location: [place];  Service: Orthopedics;  Laterality: Left;  . Steriod injection Bilateral 08/08/2013    Procedure: STEROID INJECTION;  Surgeon: Rozanna Box, MD;  Location: [place];  Service: Orthopedics;  Laterality: Bilateral;  . Total  hip revision Left 02/10/2014    Procedure: REMOVAL OF SYNTHESE SCREWS LEFT HIP AND CONVERSION LEFT TOTAL HIP ARTHROPLASTY;  Surgeon: Mauri Pole, MD;  Location: [facility];  Service: Orthopedics;  Laterality: Left;    History   Social History  . Marital Status: Single    Spouse Name: N/A    Number of Children: N/A  . Years of Education: N/A   Occupational History  . Not on file.   Social History Main Topics  . Smoking status: Former Smoker -- 0.30 packs/day for 60 years    Quit date: 10/02/2012  . Smokeless tobacco: Never Used  . Alcohol Use: Yes     Comment: occasioally  . Drug Use: No  . Sexual Activity: Not on file   Other Topics Concern  . Not on file   Social History Narrative  . No narrative on file    Family History  Problem Relation Age of Onset  . Heart attack Mother   . Heart disease Mother   . Heart attack Father   . Diabetes Son     amputation-BKA     Allergies as of 04/28/2014 - Review Complete 04/28/2014  Allergen Reaction Noted  . Aspirin Other (See Comments) 10/17/2011    Current Outpatient Prescriptions on File Prior to Visit  Medication Sig Dispense Refill  . docusate sodium 100 MG CAPS Take 100 mg by mouth 2 (two)  times daily.  10 capsule  0  . DUREZOL 0.05 % EMUL Injection every 6 wks into eye      . enoxaparin (LOVENOX) 30 MG/0.3ML injection Inject 0.3 mLs (30 mg total) into the skin daily.  14 Syringe  0  . ferrous sulfate 325 (65 FE) MG tablet Take 1 tablet (325 mg total) by mouth 3 (three) times daily after meals.    3  . levothyroxine (SYNTHROID, LEVOTHROID) 88 MCG tablet Take 1 tablet (88 mcg total) by mouth every morning.  90 tablet  3  . magnesium oxide (MAG-OX) 400 MG tablet Take 400 mg by mouth daily.      . Multiple Vitamins-Minerals (EYE VITAMINS PO) Take 4 tablets by mouth daily.      Marland Kitchen oxyCODONE (OXY IR/ROXICODONE) 5 MG immediate release tablet Take 1-2 tablets (5-10 mg total) by mouth every 6 (six) hours as needed for severe pain.   120 tablet  0  . polyethylene glycol (MIRALAX / GLYCOLAX) packet Take 17 g by mouth 2 (two) times daily.  14 each  0  . sulfamethoxazole-trimethoprim (BACTRIM) 400-80 MG per tablet Take 1 tablet by mouth 2 (two) times daily.  60 tablet  0  . tiZANidine (ZANAFLEX) 4 MG tablet Take 1 tablet (4 mg total) by mouth every 6 (six) hours as needed for muscle spasms.  30 tablet  0  . triamcinolone cream (KENALOG) 0.1 % Apply 1 application topically 2 (two) times daily as needed (itching).      . vitamin D, CHOLECALCIFEROL, 400 UNITS tablet Take 800 Units by mouth daily.       No current facility-administered medications on file prior to visit.     REVIEW OF SYSTEMS: Please see history of present illness, otherwise all systems negative  PHYSICAL EXAMINATION:   Vital signs are BP 151/66  Pulse 59  Ht 5\' 9"  (1.753 m)  Wt 166 lb (75.297 kg)  BMI 24.50 kg/m2  SpO2 100% General: The patient appears their stated age. HEENT:  No gross abnormalities Pulmonary:  Non labored breathing Abdomen: Soft and non-tender.  Midline incision is well-healed without evidence of hernia Musculoskeletal: There are no major deformities. Neurologic:  Skin: There a large left heel ulcer psychiatric: The patient has normal affect. Cardiovascular: There is a regular rate and rhythm without significant murmur appreciated.   Diagnostic Studies  I have reviewed the ultrasound studies which were performed several weeks ago.  ABIs cannot be calculated secondary to vessel calcification.  Waveforms were monophasic in the tibial vessels.    Assessment:  peripheral vascular disease with ulceration, left heel  Plan: The patient will be scheduled for angiography to better define his arterial anatomy.  I suspect that decreased circulation is affecting his wound healing.  The patient does have a history of renal insufficiency, therefore I will need to minimize the use of contrast.  He has a history of a tube graft placed in his  aorta.  Previous duplex imaging showed moderate iliac stenosis bilaterally.  I plan on imaging the aorta with CO2 and then limiting his contrast utilization.  I'll access the right groin with plans for intervention as indicated.  This is been scheduled for Tuesday, June 16.    [name], M.D. [facility] [place] Office: [phone] Pager:  [phone]

## 2014-05-01 ENCOUNTER — Encounter (HOSPITAL_COMMUNITY): Payer: Self-pay | Admitting: Pharmacy Technician

## 2014-05-05 MED ORDER — SODIUM CHLORIDE 0.9 % IV SOLN
INTRAVENOUS | Status: DC
  Administered 2014-05-06: 08:00:00 via INTRAVENOUS

## 2014-05-06 ENCOUNTER — Ambulatory Visit (HOSPITAL_COMMUNITY)
Admission: RE | Admit: 2014-05-06 | Discharge: 2014-05-06 | Disposition: A | Discharge status: Home / Self Care | Payer: Medicare Other | Source: Ambulatory Visit | Attending: Surgery | Admitting: Surgery

## 2014-05-06 ENCOUNTER — Encounter (HOSPITAL_COMMUNITY)
Admission: RE | Disposition: A | Discharge status: Home / Self Care | Payer: Self-pay | Source: Ambulatory Visit | Attending: Surgery

## 2014-05-06 DIAGNOSIS — L98499 Non-pressure chronic ulcer of skin of other sites with unspecified severity: Secondary | ICD-10-CM

## 2014-05-06 DIAGNOSIS — I739 Peripheral vascular disease, unspecified: Secondary | ICD-10-CM

## 2014-05-06 DIAGNOSIS — N289 Disorder of kidney and ureter, unspecified: Secondary | ICD-10-CM | POA: Insufficient documentation

## 2014-05-06 DIAGNOSIS — G609 Hereditary and idiopathic neuropathy, unspecified: Secondary | ICD-10-CM | POA: Insufficient documentation

## 2014-05-06 DIAGNOSIS — Z96649 Presence of unspecified artificial hip joint: Secondary | ICD-10-CM | POA: Insufficient documentation

## 2014-05-06 DIAGNOSIS — N400 Enlarged prostate without lower urinary tract symptoms: Secondary | ICD-10-CM | POA: Insufficient documentation

## 2014-05-06 DIAGNOSIS — L97409 Non-pressure chronic ulcer of unspecified heel and midfoot with unspecified severity: Secondary | ICD-10-CM | POA: Insufficient documentation

## 2014-05-06 DIAGNOSIS — M129 Arthropathy, unspecified: Secondary | ICD-10-CM | POA: Insufficient documentation

## 2014-05-06 DIAGNOSIS — E039 Hypothyroidism, unspecified: Secondary | ICD-10-CM | POA: Insufficient documentation

## 2014-05-06 DIAGNOSIS — Z87891 Personal history of nicotine dependence: Secondary | ICD-10-CM | POA: Insufficient documentation

## 2014-05-06 DIAGNOSIS — I10 Essential (primary) hypertension: Secondary | ICD-10-CM | POA: Insufficient documentation

## 2014-05-06 HISTORY — PX: ABDOMINAL AORTAGRAM: SHX5454

## 2014-05-06 LAB — GLUCOSE, CAPILLARY: GLUCOSE-CAPILLARY: 88 mg/dL (ref 70–99)

## 2014-05-06 LAB — POCT I-STAT, CHEM 8
BUN: 40 mg/dL — AB (ref 6–23)
CALCIUM ION: 1.11 mmol/L — AB (ref 1.13–1.30)
Chloride: 115 mEq/L — ABNORMAL HIGH (ref 96–112)
Creatinine, Ser: 2.1 mg/dL — ABNORMAL HIGH (ref 0.50–1.35)
GLUCOSE: 107 mg/dL — AB (ref 70–99)
HCT: 34 % — ABNORMAL LOW (ref 39.0–52.0)
Hemoglobin: 11.6 g/dL — ABNORMAL LOW (ref 13.0–17.0)
Potassium: 4.6 mEq/L (ref 3.7–5.3)
Sodium: 144 mEq/L (ref 137–147)
TCO2: 15 mmol/L (ref 0–100)

## 2014-05-06 SURGERY — ABDOMINAL AORTAGRAM
Anesthesia: LOCAL

## 2014-05-06 MED ORDER — ONDANSETRON HCL 4 MG/2ML IJ SOLN: 4.0000 mg | Freq: Four times a day (QID) | INTRAMUSCULAR | Status: DC | PRN

## 2014-05-06 MED ORDER — METOPROLOL TARTRATE 1 MG/ML IV SOLN: 2.0000 mg | INTRAVENOUS | Status: DC | PRN

## 2014-05-06 MED ORDER — FENTANYL CITRATE 0.05 MG/ML IJ SOLN
INTRAMUSCULAR | Status: AC
  Filled 2014-05-06: qty 2

## 2014-05-06 MED ORDER — PHENOL 1.4 % MT LIQD: 1.0000 | OROMUCOSAL | Status: DC | PRN

## 2014-05-06 MED ORDER — HEPARIN (PORCINE) IN NACL 2-0.9 UNIT/ML-% IJ SOLN
INTRAMUSCULAR | Status: AC
  Filled 2014-05-06: qty 1000

## 2014-05-06 MED ORDER — ACETAMINOPHEN 325 MG RE SUPP: 325.0000 mg | RECTAL | Status: DC | PRN

## 2014-05-06 MED ORDER — SODIUM CHLORIDE 0.9 % IV SOLN: 1.0000 mL/kg/h | INTRAVENOUS | Status: DC

## 2014-05-06 MED ORDER — HYDRALAZINE HCL 20 MG/ML IJ SOLN: 10.0000 mg | INTRAMUSCULAR | Status: DC | PRN

## 2014-05-06 MED ORDER — ALUM & MAG HYDROXIDE-SIMETH 200-200-20 MG/5ML PO SUSP: 15.0000 mL | ORAL | Status: DC | PRN

## 2014-05-06 MED ORDER — MIDAZOLAM HCL 2 MG/2ML IJ SOLN
INTRAMUSCULAR | Status: AC
  Filled 2014-05-06: qty 2

## 2014-05-06 MED ORDER — LABETALOL HCL 5 MG/ML IV SOLN: 10.0000 mg | INTRAVENOUS | Status: DC | PRN

## 2014-05-06 MED ORDER — ACETAMINOPHEN 325 MG PO TABS: 325.0000 mg | ORAL_TABLET | ORAL | Status: DC | PRN

## 2014-05-06 MED ORDER — LIDOCAINE HCL (PF) 1 % IJ SOLN
INTRAMUSCULAR | Status: AC
  Filled 2014-05-06: qty 30

## 2014-05-06 MED ORDER — GUAIFENESIN-DM 100-10 MG/5ML PO SYRP: 15.0000 mL | ORAL_SOLUTION | ORAL | Status: DC | PRN

## 2014-05-06 SURGICAL SUPPLY — 53 items
BANDAGE ELASTIC 4 VELCRO ST LF (GAUZE/BANDAGES/DRESSINGS) IMPLANT
BANDAGE ESMARK 6X9 LF (GAUZE/BANDAGES/DRESSINGS) IMPLANT
BNDG ESMARK 6X9 LF (GAUZE/BANDAGES/DRESSINGS)
CANISTER SUCTION 2500CC (MISCELLANEOUS) ×3 IMPLANT
CLIP TI MEDIUM 24 (CLIP) ×3 IMPLANT
CLIP TI WIDE RED SMALL 24 (CLIP) ×3 IMPLANT
COVER SURGICAL LIGHT HANDLE (MISCELLANEOUS) ×3 IMPLANT
CUFF TOURNIQUET SINGLE 24IN (TOURNIQUET CUFF) IMPLANT
CUFF TOURNIQUET SINGLE 34IN LL (TOURNIQUET CUFF) IMPLANT
CUFF TOURNIQUET SINGLE 44IN (TOURNIQUET CUFF) IMPLANT
DERMABOND ADVANCED (GAUZE/BANDAGES/DRESSINGS) ×1
DERMABOND ADVANCED .7 DNX12 (GAUZE/BANDAGES/DRESSINGS) ×2 IMPLANT
DRAIN CHANNEL 15F RND FF W/TCR (WOUND CARE) IMPLANT
DRAPE WARM FLUID 44X44 (DRAPE) ×3 IMPLANT
DRAPE X-RAY CASS 24X20 (DRAPES) IMPLANT
DRSG COVADERM 4X10 (GAUZE/BANDAGES/DRESSINGS) IMPLANT
DRSG COVADERM 4X8 (GAUZE/BANDAGES/DRESSINGS) IMPLANT
ELECT REM PT RETURN 9FT ADLT (ELECTROSURGICAL) ×3
ELECTRODE REM PT RTRN 9FT ADLT (ELECTROSURGICAL) ×2 IMPLANT
EVACUATOR SILICONE 100CC (DRAIN) IMPLANT
GLOVE BIOGEL PI IND STRL 7.5 (GLOVE) ×2 IMPLANT
GLOVE BIOGEL PI INDICATOR 7.5 (GLOVE) ×1
GLOVE SURG SS PI 7.5 STRL IVOR (GLOVE) ×3 IMPLANT
GOWN PREVENTION PLUS XXLARGE (GOWN DISPOSABLE) ×3 IMPLANT
GOWN STRL NON-REIN LRG LVL3 (GOWN DISPOSABLE) ×9 IMPLANT
HEMOSTAT SNOW SURGICEL 2X4 (HEMOSTASIS) IMPLANT
KIT BASIN OR (CUSTOM PROCEDURE TRAY) ×3 IMPLANT
KIT ROOM TURNOVER OR (KITS) ×3 IMPLANT
MARKER GRAFT CORONARY BYPASS (MISCELLANEOUS) IMPLANT
NS IRRIG 1000ML POUR BTL (IV SOLUTION) ×6 IMPLANT
PACK PERIPHERAL VASCULAR (CUSTOM PROCEDURE TRAY) ×3 IMPLANT
PAD ARMBOARD 7.5X6 YLW CONV (MISCELLANEOUS) ×6 IMPLANT
PADDING CAST COTTON 6X4 STRL (CAST SUPPLIES) IMPLANT
SET COLLECT BLD 21X3/4 12 (NEEDLE) IMPLANT
STOPCOCK 4 WAY LG BORE MALE ST (IV SETS) IMPLANT
SUT ETHILON 3 0 PS 1 (SUTURE) IMPLANT
SUT PROLENE 5 0 C 1 24 (SUTURE) ×3 IMPLANT
SUT PROLENE 6 0 BV (SUTURE) ×3 IMPLANT
SUT PROLENE 7 0 BV 1 (SUTURE) IMPLANT
SUT SILK 2 0 SH (SUTURE) ×3 IMPLANT
SUT SILK 3 0 (SUTURE)
SUT SILK 3-0 18XBRD TIE 12 (SUTURE) IMPLANT
SUT VIC AB 2-0 CT1 27 (SUTURE) ×2
SUT VIC AB 2-0 CT1 TAPERPNT 27 (SUTURE) ×4 IMPLANT
SUT VIC AB 3-0 SH 27 (SUTURE) ×2
SUT VIC AB 3-0 SH 27X BRD (SUTURE) ×4 IMPLANT
SUT VICRYL 4-0 PS2 18IN ABS (SUTURE) ×6 IMPLANT
TOWEL OR 17X24 6PK STRL BLUE (TOWEL DISPOSABLE) ×6 IMPLANT
TOWEL OR 17X26 10 PK STRL BLUE (TOWEL DISPOSABLE) ×6 IMPLANT
TRAY FOLEY CATH 16FRSI W/METER (SET/KITS/TRAYS/PACK) ×3 IMPLANT
TUBING EXTENTION W/L.L. (IV SETS) IMPLANT
UNDERPAD 30X30 INCONTINENT (UNDERPADS AND DIAPERS) ×3 IMPLANT
WATER STERILE IRR 1000ML POUR (IV SOLUTION) ×3 IMPLANT

## 2014-05-06 NOTE — Op Note (Signed)
    Patient name: [name]. MRN: [id number] DOB: 02-Jun-1931 Sex: male  05/06/2014 Pre-operative Diagnosis: Left heel ulcer Post-operative diagnosis:  Same Surgeon:  [name] Procedure Performed:  1.  ultrasound-guided access, right femoral artery  2.  abdominal aortogram with CO2  3.  third order catheterization (left popliteal artery)  4.  left lower extremity runoff   Indications:  The patient has a nonhealing wound on his left heel.  Ultrasound identified monophasic waveforms.  622 calcification, ABIs could not be obtained.  The patient comes in for further evaluation.  His creatinine was 2.1.  He was hydrated before and after the procedure.  A total of 28 cc of contrast were utilized  Procedure:  The patient was identified in the holding area and taken to room 8.  The patient was then placed supine on the table and prepped and draped in the usual sterile fashion.  A time out was called.  Ultrasound was used to evaluate the right common femoral artery.  It was patent .  A digital ultrasound image was acquired.  A micropuncture needle was used to access the right common femoral artery under ultrasound guidance.  An 018 wire was advanced without resistance and a micropuncture sheath was placed.  The 018 wire was removed and a benson wire was placed.  The micropuncture sheath was exchanged for a 5 french sheath.  An omniflush catheter was advanced over the wire to the level of L-1.  An abdominal angiogram with CO2 was obtained.  Next, using the omniflush catheter and a benson wire, the aortic bifurcation was crossed and the catheter was placed into theleft external iliac artery and left runoff was obtained with CO2.  I was able to visualize down to below the knee with CO2, however I wanted to get better images of the tibial vessels.  Therefore I used a 035 Bentson wire and a 4 French 90 cm straight catheter which was placed into the left popliteal artery.  Contrast injections were  performed.  Pullback pressures were performed which showed mild changes within the superficial femoral artery.  No significant aortoiliac pressure discrepancy was identified.  Findings:   Aortogram:  The suprarenal abdominal aorta is patent without stenosis.  The renal arteries are widely patent.  A aortic tube graft is visualized within the infrarenal abdominal aorta which is widely patent.  No significant common iliac, external iliac artery stenosis is identified.  Right Lower Extremity:  Not imaged today  Left Lower Extremity:  The left common femoral and profunda femoral artery are widely patent.  There is mild diffuse disease throughout the left superficial femoral artery without a focal stenosis.  Contrast traverses this area very rapidly.  There is a high/early takeoff of the anterior tibial artery which is the dominant vessel out onto the foot.  The peroneal and posterior tibial artery are patent proximally however become very diminutive in the lower leg.  Diffuse disease is noted out onto the foot.  Intervention:  None  Impression:  #1  no significant aortoiliac stenosis.  Widely patent abdominal tube graft  #2  mild diffuse disease is noted within the left superficial femoral artery without a focal stenosis.  #3  the dominant runoff is the anterior tibial artery which is a early takeoff vessel.  #4  diffuse microvascular disease out onto the foot.     Theotis Burrow, M.D. [facility] [place] Office: [phone] Pager:  [phone]

## 2014-05-06 NOTE — H&P (View-Only) (Signed)
Patient name: [name]. MRN: [id number] DOB: 11/25/1930 Sex: male     Chief Complaint  Patient presents with  . Re-evaluation    Lt foot ischemia    HISTORY OF PRESENT ILLNESS: The patient is here today for evaluation of a left foot wound.  He is well known to me, having undergone open abdominal aortic aneurysm repair on 05/19/2010.  He was not a candidate for endovascular repair given his short infrarenal neck.  The patient now reports a left heel wound for approximately one month.  This is being treated at the wound center.  Recent arterial evaluation showed calcified vessels with monophasic waveforms.  He is getting compression wrap for swelling and dressing changes.  The patient suffers from renal insufficiency.  His most recent creatinine was 1.9.  He is medically managed for hypertension.  He is a former smoker.  Past Medical History  Diagnosis Date  . Hypothyroid   . BPH (benign prostatic hyperplasia)   . Renal insufficiency   . Glucose intolerance (impaired glucose tolerance)   . Complication of anesthesia     "a tough time"  -pt unable to explain  . Hypertension   . Pancreatitis 15-20 yrs ago  . AAA (abdominal aortic aneurysm)   . Peripheral neuropathy     fingers and toes  . Arthritis     Past Surgical History  Procedure Laterality Date  . Abdominal aortic aneurysm repair  july 2011  . Prostate surgery  5 yrs ago    urethral dilation  . Amputation Right 01/03/2013    Procedure: AMPUTATION DIGIT;  Surgeon: Wylene Simmer, MD;  Location: [facility];  Service: Orthopedics;  Laterality: Right;  RIGHT 2ND TOE AMPUTATION  . Hip pinning,cannulated Left 08/08/2013    Procedure: LEFT CANNULATED HIP PINNING;  Surgeon: Rozanna Box, MD;  Location: [facility];  Service: Orthopedics;  Laterality: Left;  . Steriod injection Bilateral 08/08/2013    Procedure: STEROID INJECTION;  Surgeon: Rozanna Box, MD;  Location: [facility];  Service: Orthopedics;  Laterality: Bilateral;  . Total  hip revision Left 02/10/2014    Procedure: REMOVAL OF SYNTHESE SCREWS LEFT HIP AND CONVERSION LEFT TOTAL HIP ARTHROPLASTY;  Surgeon: Mauri Pole, MD;  Location: [facility];  Service: Orthopedics;  Laterality: Left;    History   Social History  . Marital Status: Single    Spouse Name: N/A    Number of Children: N/A  . Years of Education: N/A   Occupational History  . Not on file.   Social History Main Topics  . Smoking status: Former Smoker -- 0.30 packs/day for 60 years    Quit date: 10/02/2012  . Smokeless tobacco: Never Used  . Alcohol Use: Yes     Comment: occasioally  . Drug Use: No  . Sexual Activity: Not on file   Other Topics Concern  . Not on file   Social History Narrative  . No narrative on file    Family History  Problem Relation Age of Onset  . Heart attack Mother   . Heart disease Mother   . Heart attack Father   . Diabetes Son     amputation-BKA     Allergies as of 04/28/2014 - Review Complete 04/28/2014  Allergen Reaction Noted  . Aspirin Other (See Comments) 10/17/2011    Current Outpatient Prescriptions on File Prior to Visit  Medication Sig Dispense Refill  . docusate sodium 100 MG CAPS Take 100 mg by mouth 2 (two)  times daily.  10 capsule  0  . DUREZOL 0.05 % EMUL Injection every 6 wks into eye      . enoxaparin (LOVENOX) 30 MG/0.3ML injection Inject 0.3 mLs (30 mg total) into the skin daily.  14 Syringe  0  . ferrous sulfate 325 (65 FE) MG tablet Take 1 tablet (325 mg total) by mouth 3 (three) times daily after meals.    3  . levothyroxine (SYNTHROID, LEVOTHROID) 88 MCG tablet Take 1 tablet (88 mcg total) by mouth every morning.  90 tablet  3  . magnesium oxide (MAG-OX) 400 MG tablet Take 400 mg by mouth daily.      . Multiple Vitamins-Minerals (EYE VITAMINS PO) Take 4 tablets by mouth daily.      Marland Kitchen oxyCODONE (OXY IR/ROXICODONE) 5 MG immediate release tablet Take 1-2 tablets (5-10 mg total) by mouth every 6 (six) hours as needed for severe pain.   120 tablet  0  . polyethylene glycol (MIRALAX / GLYCOLAX) packet Take 17 g by mouth 2 (two) times daily.  14 each  0  . sulfamethoxazole-trimethoprim (BACTRIM) 400-80 MG per tablet Take 1 tablet by mouth 2 (two) times daily.  60 tablet  0  . tiZANidine (ZANAFLEX) 4 MG tablet Take 1 tablet (4 mg total) by mouth every 6 (six) hours as needed for muscle spasms.  30 tablet  0  . triamcinolone cream (KENALOG) 0.1 % Apply 1 application topically 2 (two) times daily as needed (itching).      . vitamin D, CHOLECALCIFEROL, 400 UNITS tablet Take 800 Units by mouth daily.       No current facility-administered medications on file prior to visit.     REVIEW OF SYSTEMS: Please see history of present illness, otherwise all systems negative  PHYSICAL EXAMINATION:   Vital signs are BP 151/66  Pulse 59  Ht 5\' 9"  (1.753 m)  Wt 166 lb (75.297 kg)  BMI 24.50 kg/m2  SpO2 100% General: The patient appears their stated age. HEENT:  No gross abnormalities Pulmonary:  Non labored breathing Abdomen: Soft and non-tender.  Midline incision is well-healed without evidence of hernia Musculoskeletal: There are no major deformities. Neurologic:  Skin: There a large left heel ulcer psychiatric: The patient has normal affect. Cardiovascular: There is a regular rate and rhythm without significant murmur appreciated.   Diagnostic Studies  I have reviewed the ultrasound studies which were performed several weeks ago.  ABIs cannot be calculated secondary to vessel calcification.  Waveforms were monophasic in the tibial vessels.    Assessment:  peripheral vascular disease with ulceration, left heel  Plan: The patient will be scheduled for angiography to better define his arterial anatomy.  I suspect that decreased circulation is affecting his wound healing.  The patient does have a history of renal insufficiency, therefore I will need to minimize the use of contrast.  He has a history of a tube graft placed in his  aorta.  Previous duplex imaging showed moderate iliac stenosis bilaterally.  I plan on imaging the aorta with CO2 and then limiting his contrast utilization.  I'll access the right groin with plans for intervention as indicated.  This is been scheduled for Tuesday, June 16.    [name], M.D. [facility] Office: [phone] Pager:  [phone]

## 2014-05-06 NOTE — Discharge Instructions (Signed)
Arteriogram °Care After °These instructions give you information on caring for yourself after your procedure. Your doctor may also give you more specific instructions. Call your doctor if you have any problems or questions after your procedure. °HOME CARE °· Stay in bed the rest of the day. °· Keep your leg straight for at least 6 hours. °· Do not lift anything heavier than 10 pounds (about a gallon of milk) for 2 days. °· Do not walk a lot, run, or drive for 2 days. °· Return to normal activities in 2 days or as told by your doctor. °Finding out the results of your test °Ask when your test results will be ready. Make sure you get your test results. °GET HELP RIGHT AWAY IF:  °· You have fever of 102° F (38.9° C) or higher. °· You have more pain in your leg. °· The leg that was cut is: °· Bleeding. °· Puffy (swollen) or red. °· Cold. °· Pale or changes color. °· Weak. °· Tingly or numb. °If you go to the Emergency Room, tell your nurse that you have had an arteriogram. Take this paper with you to show the nurse. °MAKE SURE YOU: °· Understand these instructions. °· Will watch your condition. °· Will get help right away if you are not doing well or get worse. °Document Released: 02/03/2009 Document Revised: 01/30/2012 Document Reviewed: 02/03/2009 °[employer]® Patient Information ©2014 [employer]. ° °

## 2014-05-06 NOTE — Interval H&P Note (Signed)
History and Physical Interval Note:  05/06/2014 11:20 AM  [name].  has presented today for surgery, with the diagnosis of pvd  The various methods of treatment have been discussed with the patient and family. After consideration of risks, benefits and other options for treatment, the patient has consented to  Procedure(s): ABDOMINAL AORTAGRAM (N/A) ANGIOGRAM EXTREMITY LEFT (Left) as a surgical intervention .  The patient's history has been reviewed, patient examined, no change in status, stable for surgery.  I have reviewed the patient's chart and labs.  Questions were answered to the patient's satisfaction.     BRABHAM IV, V. WELLS

## 2014-05-06 NOTE — Progress Notes (Signed)
Received pt from cath lab in no acute distress. No chest pain, no sob. Alert oriented .flat in bed, sheath present to r groin. Sheath removal started at 1040, pressure maintained until 1105, no obvious complication, no bleeding, no hematoma. Distal pulses present, no discomfort. 4x4 and tegaderm applied to site. Denies need to void. Advised to remain flat in bed until 3 pm, to keep head on bed, and to maintain pressure with hand at groin site if the urge to cough or sneeze is present.

## 2014-05-22 ENCOUNTER — Encounter (HOSPITAL_BASED_OUTPATIENT_CLINIC_OR_DEPARTMENT_OTHER): Payer: Medicare Other | Attending: Internal Medicine

## 2014-05-22 DIAGNOSIS — L89609 Pressure ulcer of unspecified heel, unspecified stage: Secondary | ICD-10-CM | POA: Diagnosis present

## 2014-05-22 DIAGNOSIS — L8993 Pressure ulcer of unspecified site, stage 3: Secondary | ICD-10-CM | POA: Insufficient documentation

## 2014-05-29 DIAGNOSIS — L8993 Pressure ulcer of unspecified site, stage 3: Secondary | ICD-10-CM | POA: Diagnosis not present

## 2014-05-29 DIAGNOSIS — L89609 Pressure ulcer of unspecified heel, unspecified stage: Secondary | ICD-10-CM | POA: Diagnosis not present

## 2014-06-02 ENCOUNTER — Telehealth: Payer: Self-pay | Admitting: Surgery

## 2014-06-02 NOTE — Telephone Encounter (Signed)
Left message for patient, dpm

## 2014-06-02 NOTE — Telephone Encounter (Signed)
Message copied by Gena Fray on Mon Jun 02, 2014 10:33 AM ------      Message from: Denman George      Created: Fri May 30, 2014  3:51 PM      Regarding: RE: ? follow up       Hinton Dyer- [facility] didn't show any critical stenosis, but I am not sure if Dr. Trula Slade would want to see him in f/u regardless.  We could schedule it, and then Dr. Trula Slade could explain results. ~ Arbie Cookey              ----- Message -----         From: Gena Fray         Sent: 05/28/2014   3:45 PM           To: Mena Goes, CMA      Subject: ? follow up                                              Mr Romeo had AGM with [facility]off on 05/06/14. Does he need follow up with our office?            Thanks,      Hinton Dyer       ------

## 2014-06-05 DIAGNOSIS — L8993 Pressure ulcer of unspecified site, stage 3: Secondary | ICD-10-CM | POA: Diagnosis not present

## 2014-06-05 DIAGNOSIS — L89609 Pressure ulcer of unspecified heel, unspecified stage: Secondary | ICD-10-CM | POA: Diagnosis not present

## 2014-06-19 DIAGNOSIS — L8993 Pressure ulcer of unspecified site, stage 3: Secondary | ICD-10-CM | POA: Diagnosis not present

## 2014-06-19 DIAGNOSIS — L89609 Pressure ulcer of unspecified heel, unspecified stage: Secondary | ICD-10-CM | POA: Diagnosis not present

## 2014-06-26 ENCOUNTER — Encounter (HOSPITAL_BASED_OUTPATIENT_CLINIC_OR_DEPARTMENT_OTHER): Payer: Medicare Other | Attending: Internal Medicine

## 2014-06-26 DIAGNOSIS — L8993 Pressure ulcer of unspecified site, stage 3: Secondary | ICD-10-CM | POA: Insufficient documentation

## 2014-06-26 DIAGNOSIS — L89609 Pressure ulcer of unspecified heel, unspecified stage: Secondary | ICD-10-CM | POA: Insufficient documentation

## 2014-06-30 ENCOUNTER — Encounter: Payer: Medicare Other | Admitting: Surgery

## 2014-07-03 DIAGNOSIS — L8993 Pressure ulcer of unspecified site, stage 3: Secondary | ICD-10-CM | POA: Diagnosis not present

## 2014-07-03 DIAGNOSIS — L89609 Pressure ulcer of unspecified heel, unspecified stage: Secondary | ICD-10-CM | POA: Diagnosis not present

## 2014-07-11 ENCOUNTER — Encounter: Payer: Self-pay | Admitting: Surgery

## 2014-07-11 DIAGNOSIS — L89609 Pressure ulcer of unspecified heel, unspecified stage: Secondary | ICD-10-CM | POA: Diagnosis not present

## 2014-07-11 DIAGNOSIS — L8993 Pressure ulcer of unspecified site, stage 3: Secondary | ICD-10-CM | POA: Diagnosis not present

## 2014-07-14 ENCOUNTER — Encounter: Payer: Medicare Other | Admitting: Surgery

## 2014-07-17 DIAGNOSIS — L8993 Pressure ulcer of unspecified site, stage 3: Secondary | ICD-10-CM | POA: Diagnosis not present

## 2014-07-17 DIAGNOSIS — L89609 Pressure ulcer of unspecified heel, unspecified stage: Secondary | ICD-10-CM | POA: Diagnosis not present

## 2014-07-21 ENCOUNTER — Encounter: Payer: Medicare Other | Admitting: Surgery

## 2014-07-24 ENCOUNTER — Encounter (HOSPITAL_BASED_OUTPATIENT_CLINIC_OR_DEPARTMENT_OTHER): Payer: Medicare Other | Attending: Internal Medicine

## 2014-07-24 DIAGNOSIS — L89609 Pressure ulcer of unspecified heel, unspecified stage: Secondary | ICD-10-CM | POA: Diagnosis not present

## 2014-07-24 DIAGNOSIS — L8993 Pressure ulcer of unspecified site, stage 3: Secondary | ICD-10-CM | POA: Insufficient documentation

## 2014-07-31 DIAGNOSIS — L8993 Pressure ulcer of unspecified site, stage 3: Secondary | ICD-10-CM | POA: Diagnosis not present

## 2014-07-31 DIAGNOSIS — L89609 Pressure ulcer of unspecified heel, unspecified stage: Secondary | ICD-10-CM | POA: Diagnosis not present

## 2014-09-22 ENCOUNTER — Other Ambulatory Visit: Payer: Self-pay | Admitting: *Deleted

## 2014-09-22 DIAGNOSIS — Z48812 Encounter for surgical aftercare following surgery on the circulatory system: Secondary | ICD-10-CM

## 2014-09-22 DIAGNOSIS — I739 Peripheral vascular disease, unspecified: Secondary | ICD-10-CM

## 2014-09-25 ENCOUNTER — Encounter: Payer: Self-pay | Admitting: Family Medicine

## 2014-09-25 ENCOUNTER — Other Ambulatory Visit: Payer: Self-pay | Admitting: Family Medicine

## 2014-09-25 ENCOUNTER — Ambulatory Visit (INDEPENDENT_AMBULATORY_CARE_PROVIDER_SITE_OTHER): Payer: Medicare Other | Admitting: Family Medicine

## 2014-09-25 VITALS — BP 140/88 | HR 136 | Temp 97.7°F | Wt 172.0 lb

## 2014-09-25 DIAGNOSIS — T83511A Infection and inflammatory reaction due to indwelling urethral catheter, initial encounter: Principal | ICD-10-CM

## 2014-09-25 DIAGNOSIS — N390 Urinary tract infection, site not specified: Secondary | ICD-10-CM

## 2014-09-25 DIAGNOSIS — T8351XA Infection and inflammatory reaction due to indwelling urinary catheter, initial encounter: Secondary | ICD-10-CM

## 2014-09-25 DIAGNOSIS — R609 Edema, unspecified: Secondary | ICD-10-CM

## 2014-09-25 DIAGNOSIS — H3530 Unspecified macular degeneration: Secondary | ICD-10-CM | POA: Insufficient documentation

## 2014-09-25 DIAGNOSIS — R252 Cramp and spasm: Secondary | ICD-10-CM

## 2014-09-25 DIAGNOSIS — R339 Retention of urine, unspecified: Secondary | ICD-10-CM

## 2014-09-25 LAB — COMPREHENSIVE METABOLIC PANEL
ALK PHOS: 107 U/L (ref 39–117)
ALT: 12 U/L (ref 0–53)
AST: 13 U/L (ref 0–37)
Albumin: 3.6 g/dL (ref 3.5–5.2)
BILIRUBIN TOTAL: 0.6 mg/dL (ref 0.2–1.2)
BUN: 28 mg/dL — AB (ref 6–23)
CO2: 24 meq/L (ref 19–32)
Calcium: 6.2 mg/dL — CL (ref 8.4–10.5)
Chloride: 104 mEq/L (ref 96–112)
Creat: 1.89 mg/dL — ABNORMAL HIGH (ref 0.50–1.35)
GLUCOSE: 102 mg/dL — AB (ref 70–99)
Potassium: 3.9 mEq/L (ref 3.5–5.3)
Sodium: 143 mEq/L (ref 135–145)
TOTAL PROTEIN: 6.2 g/dL (ref 6.0–8.3)

## 2014-09-25 LAB — CBC WITH DIFFERENTIAL/PLATELET
BASOS PCT: 0 % (ref 0–1)
Basophils Absolute: 0 10*3/uL (ref 0.0–0.1)
EOS ABS: 0.1 10*3/uL (ref 0.0–0.7)
Eosinophils Relative: 2 % (ref 0–5)
HEMATOCRIT: 33.8 % — AB (ref 39.0–52.0)
HEMOGLOBIN: 11.4 g/dL — AB (ref 13.0–17.0)
Lymphocytes Relative: 25 % (ref 12–46)
Lymphs Abs: 1.8 10*3/uL (ref 0.7–4.0)
MCH: 29.7 pg (ref 26.0–34.0)
MCHC: 33.7 g/dL (ref 30.0–36.0)
MCV: 88 fL (ref 78.0–100.0)
MONOS PCT: 8 % (ref 3–12)
Monocytes Absolute: 0.6 10*3/uL (ref 0.1–1.0)
NEUTROS PCT: 65 % (ref 43–77)
Neutro Abs: 4.6 10*3/uL (ref 1.7–7.7)
Platelets: 131 10*3/uL — ABNORMAL LOW (ref 150–400)
RBC: 3.84 MIL/uL — AB (ref 4.22–5.81)
RDW: 17.3 % — ABNORMAL HIGH (ref 11.5–15.5)
WBC: 7.1 10*3/uL (ref 4.0–10.5)

## 2014-09-25 LAB — POCT URINALYSIS DIPSTICK
Bilirubin, UA: POSITIVE
Blood, UA: 250
GLUCOSE UA: NEGATIVE
Ketones, UA: NEGATIVE
NITRITE UA: POSITIVE
Protein, UA: 100
Spec Grav, UA: 1.025
UROBILINOGEN UA: NEGATIVE
pH, UA: 5

## 2014-09-25 MED ORDER — SULFAMETHOXAZOLE-TRIMETHOPRIM 800-160 MG PO TABS: 1.0000 | ORAL_TABLET | Freq: Two times a day (BID) | ORAL | Status: DC

## 2014-09-25 NOTE — Progress Notes (Signed)
   Subjective:    Patient ID: [name]., male    DOB: 1931/07/02, 80 y.o.   MRN: [id number]  HPI He is here for multiple issues. Recently he has noted a strong odor to his urine his well as difficulty passing his urine. He was last seen by urology several months ago and given catheters to use in case he has retention issues. He did use it for the first time today and got a very small amount of urine.he was given Flomax but only used it for 2 days and then stopped it on his own. He also has a history of macular degeneration which is interfering with his ability to function.he was recently treated for a foot ulcer however this has improved tremendously. He complains of some slight hip discomfort as well as swelling in his left leg. He is concerned about infection. He also complains of tingling and cramping sensation in both legs.   Review of Systems     Objective:   Physical Exam Alert and in no distress. Left hip exam does show a healed wound. There is slight discomfort to the distal medial aspect of the incision with good motion of the hip. He does have bilateral pitting edema, left greater than right. Negative Homan's sign.no palpable tenderness to his upper thigh area. Urine dipstick was positive.       Assessment & Plan:  Urinary tract infection associated with catheterization of urinary tract, initial encounter - Plan: POCT Urinalysis Dipstick, CULTURE, URINE COMPREHENSIVE, sulfamethoxazole-trimethoprim (BACTRIM DS,SEPTRA DS) 800-160 MG per tablet  Cramp of both lower extremities - Plan: CBC with Differential, Comprehensive metabolic panel  Macular degeneration  Dependent edema - Plan: CBC with Differential, Comprehensive metabolic panel  Urinary retention encouraged him to drink more fluids. He is to start back on the Flomax. If he has difficulty with his urine, he will work to cath himself. If he has difficulty, he is to call me and I will attempt to get home health to help  with that. He states that his visual issues are interfering with him being able to catheterize himself. Recommend he elevate his feet when he is sitting to help with his edema.

## 2014-09-25 NOTE — Patient Instructions (Signed)
Start back on the Flomax and drink extra fluids. Use a catheter as needed.

## 2014-09-26 ENCOUNTER — Other Ambulatory Visit: Payer: Self-pay | Admitting: Family Medicine

## 2014-09-26 ENCOUNTER — Emergency Department (HOSPITAL_COMMUNITY): Payer: Medicare Other

## 2014-09-26 ENCOUNTER — Encounter (HOSPITAL_COMMUNITY): Payer: Self-pay | Admitting: Emergency Medicine

## 2014-09-26 ENCOUNTER — Telehealth: Payer: Self-pay | Admitting: Internal Medicine

## 2014-09-26 ENCOUNTER — Inpatient Hospital Stay (HOSPITAL_COMMUNITY)
Admission: EM | Admit: 2014-09-26 | Discharge: 2014-09-28 | DRG: 690 | Disposition: A | Discharge status: Home / Self Care | Payer: Medicare Other | Attending: Internal Medicine | Admitting: Internal Medicine

## 2014-09-26 DIAGNOSIS — N183 Chronic kidney disease, stage 3 unspecified: Secondary | ICD-10-CM | POA: Diagnosis present

## 2014-09-26 DIAGNOSIS — E876 Hypokalemia: Secondary | ICD-10-CM | POA: Diagnosis present

## 2014-09-26 DIAGNOSIS — A084 Viral intestinal infection, unspecified: Secondary | ICD-10-CM | POA: Diagnosis present

## 2014-09-26 DIAGNOSIS — Z87891 Personal history of nicotine dependence: Secondary | ICD-10-CM

## 2014-09-26 DIAGNOSIS — R778 Other specified abnormalities of plasma proteins: Secondary | ICD-10-CM | POA: Diagnosis present

## 2014-09-26 DIAGNOSIS — M1990 Unspecified osteoarthritis, unspecified site: Secondary | ICD-10-CM | POA: Diagnosis present

## 2014-09-26 DIAGNOSIS — B9689 Other specified bacterial agents as the cause of diseases classified elsewhere: Secondary | ICD-10-CM | POA: Diagnosis present

## 2014-09-26 DIAGNOSIS — R748 Abnormal levels of other serum enzymes: Secondary | ICD-10-CM | POA: Diagnosis present

## 2014-09-26 DIAGNOSIS — R109 Unspecified abdominal pain: Secondary | ICD-10-CM

## 2014-09-26 DIAGNOSIS — R7989 Other specified abnormal findings of blood chemistry: Secondary | ICD-10-CM

## 2014-09-26 DIAGNOSIS — E039 Hypothyroidism, unspecified: Secondary | ICD-10-CM | POA: Diagnosis present

## 2014-09-26 DIAGNOSIS — N179 Acute kidney failure, unspecified: Secondary | ICD-10-CM | POA: Diagnosis present

## 2014-09-26 DIAGNOSIS — G629 Polyneuropathy, unspecified: Secondary | ICD-10-CM

## 2014-09-26 DIAGNOSIS — N2581 Secondary hyperparathyroidism of renal origin: Secondary | ICD-10-CM | POA: Diagnosis present

## 2014-09-26 DIAGNOSIS — I714 Abdominal aortic aneurysm, without rupture, unspecified: Secondary | ICD-10-CM | POA: Diagnosis present

## 2014-09-26 DIAGNOSIS — R112 Nausea with vomiting, unspecified: Secondary | ICD-10-CM | POA: Diagnosis present

## 2014-09-26 DIAGNOSIS — D638 Anemia in other chronic diseases classified elsewhere: Secondary | ICD-10-CM | POA: Diagnosis present

## 2014-09-26 DIAGNOSIS — N400 Enlarged prostate without lower urinary tract symptoms: Secondary | ICD-10-CM | POA: Diagnosis present

## 2014-09-26 DIAGNOSIS — E559 Vitamin D deficiency, unspecified: Secondary | ICD-10-CM | POA: Diagnosis present

## 2014-09-26 DIAGNOSIS — I129 Hypertensive chronic kidney disease with stage 1 through stage 4 chronic kidney disease, or unspecified chronic kidney disease: Secondary | ICD-10-CM | POA: Diagnosis present

## 2014-09-26 DIAGNOSIS — E038 Other specified hypothyroidism: Secondary | ICD-10-CM

## 2014-09-26 DIAGNOSIS — N390 Urinary tract infection, site not specified: Secondary | ICD-10-CM | POA: Diagnosis present

## 2014-09-26 DIAGNOSIS — D696 Thrombocytopenia, unspecified: Secondary | ICD-10-CM | POA: Diagnosis present

## 2014-09-26 DIAGNOSIS — R197 Diarrhea, unspecified: Secondary | ICD-10-CM

## 2014-09-26 DIAGNOSIS — E119 Type 2 diabetes mellitus without complications: Secondary | ICD-10-CM | POA: Diagnosis present

## 2014-09-26 DIAGNOSIS — E860 Dehydration: Secondary | ICD-10-CM | POA: Diagnosis present

## 2014-09-26 DIAGNOSIS — N12 Tubulo-interstitial nephritis, not specified as acute or chronic: Secondary | ICD-10-CM

## 2014-09-26 DIAGNOSIS — R6883 Chills (without fever): Secondary | ICD-10-CM

## 2014-09-26 DIAGNOSIS — T83511A Infection and inflammatory reaction due to indwelling urethral catheter, initial encounter: Secondary | ICD-10-CM

## 2014-09-26 LAB — CBC WITH DIFFERENTIAL/PLATELET
BASOS ABS: 0 10*3/uL (ref 0.0–0.1)
BASOS ABS: 0 10*3/uL (ref 0.0–0.1)
BASOS PCT: 0 % (ref 0–1)
Basophils Relative: 0 % (ref 0–1)
EOS PCT: 0 % (ref 0–5)
EOS PCT: 1 % (ref 0–5)
Eosinophils Absolute: 0 10*3/uL (ref 0.0–0.7)
Eosinophils Absolute: 0.1 10*3/uL (ref 0.0–0.7)
HCT: 29 % — ABNORMAL LOW (ref 39.0–52.0)
HEMATOCRIT: 31.2 % — AB (ref 39.0–52.0)
Hemoglobin: 10.5 g/dL — ABNORMAL LOW (ref 13.0–17.0)
Hemoglobin: 9.6 g/dL — ABNORMAL LOW (ref 13.0–17.0)
LYMPHS ABS: 1.1 10*3/uL (ref 0.7–4.0)
LYMPHS ABS: 1.1 10*3/uL (ref 0.7–4.0)
LYMPHS PCT: 7 % — AB (ref 12–46)
Lymphocytes Relative: 10 % — ABNORMAL LOW (ref 12–46)
MCH: 30.3 pg (ref 26.0–34.0)
MCH: 29.6 pg (ref 26.0–34.0)
MCHC: 33.7 g/dL (ref 30.0–36.0)
MCHC: 33.1 g/dL (ref 30.0–36.0)
MCV: 89.9 fL (ref 78.0–100.0)
MCV: 89.5 fL (ref 78.0–100.0)
MONOS PCT: 6 % (ref 3–12)
Monocytes Absolute: 0.9 10*3/uL (ref 0.1–1.0)
Monocytes Absolute: 0.6 10*3/uL (ref 0.1–1.0)
Monocytes Relative: 6 % (ref 3–12)
NEUTROS PCT: 84 % — AB (ref 43–77)
Neutro Abs: 13.1 10*3/uL — ABNORMAL HIGH (ref 1.7–7.7)
Neutro Abs: 9.2 10*3/uL — ABNORMAL HIGH (ref 1.7–7.7)
Neutrophils Relative %: 87 % — ABNORMAL HIGH (ref 43–77)
PLATELETS: 73 10*3/uL — AB (ref 150–400)
Platelets: 93 10*3/uL — ABNORMAL LOW (ref 150–400)
RBC: 3.47 MIL/uL — AB (ref 4.22–5.81)
RBC: 3.24 MIL/uL — AB (ref 4.22–5.81)
RDW: 16 % — AB (ref 11.5–15.5)
RDW: 15.8 % — AB (ref 11.5–15.5)
WBC: 15.1 10*3/uL — AB (ref 4.0–10.5)
WBC: 11 10*3/uL — ABNORMAL HIGH (ref 4.0–10.5)

## 2014-09-26 LAB — BASIC METABOLIC PANEL
ANION GAP: 16 — AB (ref 5–15)
Anion gap: 18 — ABNORMAL HIGH (ref 5–15)
BUN: 41 mg/dL — AB (ref 6–23)
BUN: 42 mg/dL — ABNORMAL HIGH (ref 6–23)
CALCIUM: 6.3 mg/dL — AB (ref 8.4–10.5)
CALCIUM: 6.1 mg/dL — AB (ref 8.4–10.5)
CHLORIDE: 100 meq/L (ref 96–112)
CO2: 22 mEq/L (ref 19–32)
CO2: 22 meq/L (ref 19–32)
Chloride: 100 mEq/L (ref 96–112)
Creatinine, Ser: 2.5 mg/dL — ABNORMAL HIGH (ref 0.50–1.35)
Creatinine, Ser: 2.5 mg/dL — ABNORMAL HIGH (ref 0.50–1.35)
GFR calc Af Amer: 26 mL/min — ABNORMAL LOW (ref >90)
GFR calc non Af Amer: 23 mL/min — ABNORMAL LOW (ref >90)
GFR, EST AFRICAN AMERICAN: 26 mL/min — AB (ref >90)
GFR, EST NON AFRICAN AMERICAN: 23 mL/min — AB (ref >90)
GLUCOSE: 114 mg/dL — AB (ref 70–99)
Glucose, Bld: 102 mg/dL — ABNORMAL HIGH (ref 70–99)
Potassium: 3.8 mEq/L (ref 3.7–5.3)
Potassium: 3.4 mEq/L — ABNORMAL LOW (ref 3.7–5.3)
SODIUM: 138 meq/L (ref 137–147)
Sodium: 140 mEq/L (ref 137–147)

## 2014-09-26 LAB — I-STAT TROPONIN, ED: TROPONIN I, POC: 0.21 ng/mL — AB (ref 0.00–0.08)

## 2014-09-26 LAB — URINALYSIS, ROUTINE W REFLEX MICROSCOPIC
Bilirubin Urine: NEGATIVE
Glucose, UA: NEGATIVE mg/dL
Ketones, ur: NEGATIVE mg/dL
Nitrite: NEGATIVE
PROTEIN: 100 mg/dL — AB
SPECIFIC GRAVITY, URINE: 1.013 (ref 1.005–1.030)
Urobilinogen, UA: 0.2 mg/dL (ref 0.0–1.0)
pH: 5.5 (ref 5.0–8.0)

## 2014-09-26 LAB — URINE MICROSCOPIC-ADD ON

## 2014-09-26 LAB — PHOSPHORUS: Phosphorus: 3.4 mg/dL (ref 2.3–4.6)

## 2014-09-26 LAB — MAGNESIUM: Magnesium: 1.2 mg/dL — ABNORMAL LOW (ref 1.5–2.5)

## 2014-09-26 LAB — RETICULOCYTES
RBC.: 3.21 MIL/uL — ABNORMAL LOW (ref 4.22–5.81)
RETIC CT PCT: 1.6 % (ref 0.4–3.1)
Retic Count, Absolute: 51.4 10*3/uL (ref 19.0–186.0)

## 2014-09-26 LAB — PROTIME-INR
INR: 1.3 (ref 0.00–1.49)
PROTHROMBIN TIME: 16.4 s — AB (ref 11.6–15.2)

## 2014-09-26 LAB — LIPASE, BLOOD: Lipase: 5 U/L — ABNORMAL LOW (ref 11–59)

## 2014-09-26 LAB — I-STAT CG4 LACTIC ACID, ED: Lactic Acid, Venous: 1.21 mmol/L (ref 0.5–2.2)

## 2014-09-26 LAB — TROPONIN I: TROPONIN I: 0.34 ng/mL — AB (ref <0.30)

## 2014-09-26 MED ORDER — DEXTROSE 5 % IV SOLN
1.0000 g | INTRAVENOUS | Status: DC
  Administered 2014-09-27: 1 g via INTRAVENOUS
  Filled 2014-09-26 (×2): qty 10

## 2014-09-26 MED ORDER — SODIUM CHLORIDE 0.9 % IV SOLN
INTRAVENOUS | Status: AC
  Administered 2014-09-26: 22:00:00 via INTRAVENOUS

## 2014-09-26 MED ORDER — ONDANSETRON HCL 4 MG PO TABS: 4.0000 mg | ORAL_TABLET | Freq: Four times a day (QID) | ORAL | Status: DC | PRN

## 2014-09-26 MED ORDER — SODIUM CHLORIDE 0.9 % IV SOLN
2.0000 g | Freq: Once | INTRAVENOUS | Status: AC
  Administered 2014-09-26: 2 g via INTRAVENOUS
  Filled 2014-09-26: qty 20

## 2014-09-26 MED ORDER — CALCIUM GLUCONATE 10 % IV SOLN
1.0000 g | Freq: Once | INTRAVENOUS | Status: AC
  Administered 2014-09-26: 1 g via INTRAVENOUS
  Filled 2014-09-26: qty 10

## 2014-09-26 MED ORDER — ASPIRIN 325 MG PO TABS
325.0000 mg | ORAL_TABLET | Freq: Once | ORAL | Status: AC
  Administered 2014-09-26: 325 mg via ORAL
  Filled 2014-09-26: qty 1

## 2014-09-26 MED ORDER — LEVOTHYROXINE SODIUM 88 MCG PO TABS
88.0000 ug | ORAL_TABLET | Freq: Every day | ORAL | Status: DC
  Administered 2014-09-27 – 2014-09-28 (×2): 88 ug via ORAL
  Filled 2014-09-26 (×3): qty 1

## 2014-09-26 MED ORDER — ONDANSETRON HCL 4 MG/2ML IJ SOLN
4.0000 mg | Freq: Once | INTRAMUSCULAR | Status: AC
  Administered 2014-09-26: 4 mg via INTRAVENOUS
  Filled 2014-09-26: qty 2

## 2014-09-26 MED ORDER — SODIUM CHLORIDE 0.9 % IV BOLUS (SEPSIS)
1000.0000 mL | Freq: Once | INTRAVENOUS | Status: AC
  Administered 2014-09-26: 1000 mL via INTRAVENOUS

## 2014-09-26 MED ORDER — ONDANSETRON HCL 4 MG/2ML IJ SOLN: 4.0000 mg | Freq: Four times a day (QID) | INTRAMUSCULAR | Status: DC | PRN

## 2014-09-26 MED ORDER — VITAMIN D (ERGOCALCIFEROL) 1.25 MG (50000 UNIT) PO CAPS: 50000.0000 [IU] | ORAL_CAPSULE | ORAL | Status: DC

## 2014-09-26 MED ORDER — SODIUM CHLORIDE 0.9 % IV SOLN
2.0000 g | Freq: Once | INTRAVENOUS | Status: AC
  Administered 2014-09-27: 2 g via INTRAVENOUS
  Filled 2014-09-26: qty 20

## 2014-09-26 MED ORDER — NEOMY-BACIT-POLYMYX-PRAMOXINE 1 % EX OINT: 1.0000 "application " | TOPICAL_OINTMENT | Freq: Every day | CUTANEOUS | Status: DC | PRN

## 2014-09-26 MED ORDER — TRIAMCINOLONE ACETONIDE 0.1 % EX CREA: 1.0000 "application " | TOPICAL_CREAM | Freq: Two times a day (BID) | CUTANEOUS | Status: DC | PRN

## 2014-09-26 MED ORDER — CEFTRIAXONE SODIUM 1 G IJ SOLR
1.0000 g | Freq: Once | INTRAMUSCULAR | Status: AC
  Administered 2014-09-26: 1 g via INTRAVENOUS
  Filled 2014-09-26: qty 10

## 2014-09-26 MED ORDER — SODIUM CHLORIDE 0.9 % IJ SOLN
3.0000 mL | Freq: Two times a day (BID) | INTRAMUSCULAR | Status: DC
Start: 2014-09-26 — End: 2014-09-28
  Administered 2014-09-26: 3 mL via INTRAVENOUS

## 2014-09-26 MED ORDER — TAMSULOSIN HCL 0.4 MG PO CAPS
0.4000 mg | ORAL_CAPSULE | Freq: Every day | ORAL | Status: DC | PRN
  Filled 2014-09-26: qty 1

## 2014-09-26 MED ORDER — POLYETHYLENE GLYCOL 3350 17 G PO PACK: 17.0000 g | PACK | Freq: Every day | ORAL | Status: DC | PRN

## 2014-09-26 NOTE — Telephone Encounter (Signed)
Do a Vit D and PTH level on him and get him on 50000 units of Vit D weekly

## 2014-09-26 NOTE — Telephone Encounter (Signed)
I sent a Rx to his pharmacy for Vitamin D 50,000 units

## 2014-09-26 NOTE — Progress Notes (Signed)
Utilization Review completed.  Jabori Henegar RN CM  

## 2014-09-26 NOTE — Telephone Encounter (Signed)
I gave to [name] to add on the Vitamin D. Can't add on the PTH because it needs to be frozen. Dr. Redmond School is aware

## 2014-09-26 NOTE — H&P (Addendum)
[facility] History and Physical  [name]. BZ:[id number] DOB: [birth year] DOA: 09/26/2014  Referring physician: ED physician PCP: Wyatt Haste, MD  Specialists:   Chief Complaint:  Nausea, vomiting and diarrhea  HPI: [name]. is a 80 y.o. male with past medical history of BPH, hypothyroidism, borderline diabetes type 2, CKD-III, AAA, who presents with nausea, vomiting or diarrhea.  Patient reports that he has been doing well until yesterday, when he started having nausea, vomiting and diarrhea. No abdominal pain. He had 2 bowel movements with watery stools each day. No blood in the stools. No sick contact. He was evaluated by his PCP yesterday, diagnosed with vitamin D deficiency, and prescribed high-dose of vitamin D. Patient did not pickup the prescription yet.   Patient also reports having burning on urination and urinary frequency recently, but no dysuria. He was given prescription of Bactrim yesterday by PCP, but he did not pick up the prescription yet.    Patient has chronic peripheral neuropathy in legs and hands. Today he reports that he has decreased sensation in both legs, but worse on the left leg. He does not have weakness in his extremities. Patient does not have fever, chills, cough, chest pain, shortness of breath.  Work up in the ED demonstrates Hypocalcemia with calcium of 6.3. Patient has leukocytosis with WBC 15.1. Creatinine increased from baseline 1.6-2.1 to 2.5 on admission. Lipase 5. Patient has elevated troponin at 0.34, but not chest pain. CT abdomen is negative for acute abnormalities. Chest x-ray is negative for acute abnormalities. Patient is admitted to inpatient for further evaluation and treatment.  Review of Systems: As presented in the history of presenting illness, rest negative.  Where does patient live?  Lives at home.  Can patient participate in ADLs? little  Allergy:  Allergies  Allergen Reactions  . Aspirin Other (See  Comments)    Low bp, stomach pains    Past Medical History  Diagnosis Date  . Hypothyroid   . BPH (benign prostatic hyperplasia)   . Renal insufficiency   . Glucose intolerance (impaired glucose tolerance)   . Complication of anesthesia     "a tough time"  -pt unable to explain  . Hypertension   . Pancreatitis 15-20 yrs ago  . AAA (abdominal aortic aneurysm)   . Peripheral neuropathy     fingers and toes  . Arthritis     Past Surgical History  Procedure Laterality Date  . Abdominal aortic aneurysm repair  july 2011  . Prostate surgery  5 yrs ago    urethral dilation  . Amputation Right 01/03/2013    Procedure: AMPUTATION DIGIT;  Surgeon: Wylene Simmer, MD;  Location: [facility];  Service: Orthopedics;  Laterality: Right;  RIGHT 2ND TOE AMPUTATION  . Hip pinning,cannulated Left 08/08/2013    Procedure: LEFT CANNULATED HIP PINNING;  Surgeon: Rozanna Box, MD;  Location: [place];  Service: Orthopedics;  Laterality: Left;  . Steriod injection Bilateral 08/08/2013    Procedure: STEROID INJECTION;  Surgeon: Rozanna Box, MD;  Location: [place];  Service: Orthopedics;  Laterality: Bilateral;  . Total hip revision Left 02/10/2014    Procedure: REMOVAL OF SYNTHESE SCREWS LEFT HIP AND CONVERSION LEFT TOTAL HIP ARTHROPLASTY;  Surgeon: Mauri Pole, MD;  Location: [facility];  Service: Orthopedics;  Laterality: Left;    Social History:  reports that he quit smoking about 1 years ago. He has never used smokeless tobacco. He reports that he drinks alcohol. He reports that  he does not use illicit drugs.  Family History:  Family History  Problem Relation Age of Onset  . Heart attack Mother   . Heart disease Mother   . Heart attack Father   . Diabetes Son     amputation-BKA      Prior to Admission medications   Medication Sig Start Date End Date Taking? Authorizing Provider  levothyroxine (SYNTHROID, LEVOTHROID) 88 MCG tablet Take 1 tablet (88 mcg total) by mouth every morning. 11/29/13  Yes Denita Lung, MD  tamsulosin (FLOMAX) 0.4 MG CAPS capsule Take 0.4 mg by mouth daily as needed (patient preference).    Yes Historical Provider, MD  mupirocin ointment (BACTROBAN) 2 % Apply 1 application topically daily as needed (itching).  02/03/14   Historical Provider, MD  Neomy-Bacit-Polymyx-Pramoxine (TRIPLE ANTIBIOTIC PAIN RELIEF) 1 % OINT Apply 1 application topically daily as needed (to sore).    Historical Provider, MD  OVER THE COUNTER MEDICATION Take 4 tablets by mouth daily. [employer]    Historical Provider, MD  polyethylene glycol (MIRALAX / GLYCOLAX) packet Take 17 g by mouth 2 (two) times daily as needed. Patient states he only takes when he needs it.    Historical Provider, MD  sulfamethoxazole-trimethoprim (BACTRIM DS,SEPTRA DS) 800-160 MG per tablet Take 1 tablet by mouth 2 (two) times daily. Patient not taking: Reported on 09/26/2014 09/25/14   Denita Lung, MD  triamcinolone cream (KENALOG) 0.1 % Apply 1 application topically 2 (two) times daily as needed (itching).    Historical Provider, MD  vitamin D, CHOLECALCIFEROL, 400 UNITS tablet Take 800 Units by mouth daily.    Historical Provider, MD  Vitamin D, Ergocalciferol, (DRISDOL) 50000 UNITS CAPS capsule Take 1 capsule (50,000 Units total) by mouth every 7 (seven) days. Patient not taking: Reported on 09/26/2014 09/26/14   Denita Lung, MD    Physical Exam: Filed Vitals:   09/26/14 1645 09/26/14 1934 09/26/14 2024  BP: 114/61 118/49 125/51  Pulse: 76 75 56  Temp: 98.9 F (37.2 C)  98.6 F (37 C)  TempSrc: Oral  Oral  Resp: 20 22 16   SpO2: 96% 98% 97%   General: Not in acute distress HEENT:       Eyes: PERRL, EOMI, no scleral icterus       ENT: No discharge from the ears and nose, no pharynx injection, no tonsillar enlargement.        Neck: No JVD, no bruit, no mass felt. Cardiac: S1/S2, RRR, No murmurs, No gallops or rubs Pulm: Good air movement bilaterally. Clear to auscultation bilaterally. No rales,  wheezing, rhonchi or rubs. Abd: Soft, nondistended, nontender, no rebound pain, no organomegaly, BS present Ext: No edema bilaterally. 2+DP/PT pulse bilaterally. R second toe amputation Musculoskeletal: No joint deformities, erythema, or stiffness, ROM full Skin: No rashes.  Neuro: Alert and oriented X3, cranial nerves II-XII grossly intact, muscle strength 5/5 in all extremeties. Has mild hand shaking. Decreased sensation to light touch on the Left leg. Brachial reflex 2+ bilaterally. Knee reflex 1+ bilaterally. Negative Babinski's sign. Normal finger to nose test. Psych: Patient is not psychotic, no suicidal or hemocidal ideation.  Labs on Admission:  Basic Metabolic Panel:  Recent Labs Lab 09/25/14 1159 09/26/14 1745  NA 143 140  K 3.9 3.8  CL 104 100  CO2 24 22  GLUCOSE 102* 102*  BUN 28* 41*  CREATININE 1.89* 2.50*  CALCIUM 6.2* 6.3*   Liver Function Tests:  Recent Labs Lab 09/25/14 1159  AST  13  ALT 12  ALKPHOS 107  BILITOT 0.6  PROT 6.2  ALBUMIN 3.6    Recent Labs Lab 09/26/14 1745  LIPASE 5*   No results for input(s): AMMONIA in the last 168 hours. CBC:  Recent Labs Lab 09/25/14 1159 09/26/14 1745  WBC 7.1 15.1*  NEUTROABS 4.6 13.1*  HGB 11.4* 10.5*  HCT 33.8* 31.2*  MCV 88.0 89.9  PLT 131* 93*   Cardiac Enzymes:  Recent Labs Lab 09/26/14 1715  TROPONINI 0.34*    BNP (last 3 results) No results for input(s): PROBNP in the last 8760 hours. CBG: No results for input(s): GLUCAP in the last 168 hours.  Radiological Exams on Admission: Ct Abdomen Pelvis Wo Contrast  09/26/2014   CLINICAL DATA:  Mid abdominal pain, vomiting and diarrhea for 2 days  EXAM: CT ABDOMEN AND PELVIS WITHOUT CONTRAST  TECHNIQUE: Multidetector CT imaging of the abdomen and pelvis was performed following the standard protocol without IV contrast.  COMPARISON:  02/05/2009.  FINDINGS: Minimal dependent bibasilar atelectasis and trace pleural fluid noted.  2.4 cm cyst noted  at the dome of the left hepatic lobe image 13. Unenhanced liver is otherwise unremarkable. Gallbladder is distended but otherwise unremarkable.  A complex lobulated fluid collection in communication with the proximal duodenum and immediately adjacent to the pancreatic head is reidentified and unchanged allowing for differences in technique, again felt to most likely represent pseudocyst related to previous pancreatitis. No pancreatic ductal dilatation is identified but the pancreas is atrophic overall. No other peripancreatic fluid collection is identified.  Nodularity of the left adrenal gland is reidentified without measurable mass. Right adrenal gland is normal in appearance. Bilateral renal cortical are reidentified, largest 6 6 cm left lower renal pole image 32. Probable hyperdense 5 mm right lower renal pole cyst image 32, with a cyst previously seen in this location measuring similar attenuation allowing for volume averaging on prior exam. A second hyperdense probable renal cyst measuring 5 mm is noted at the right mid kidney image 27, stable.  Spleen is unremarkable. The patient has undergone interval abdominal aortic aneurysm repair. No periaortic fluid is identified. No aneurysmal dilatation. Curvilinear possible vascular calcification right upper quadrant image 20, not well evaluated without contrast.  Appendix is normal. No bowel wall thickening or focal segmental dilatation is identified. Detail in the pelvis is partly obscured by left hip arthroplasty streak artifact.  Trace perinephric fluid but no ascites identified. Bladder is unremarkable with the exception of wall thickness at upper limits of normal measuring 3 mm, unchanged. No pelvic lymphadenopathy.  Degenerative change noted in the spine. No acute osseous abnormality. Mild L2 compression deformity is noted, new since the prior study but otherwise age-indeterminate without bony retropulsion.  IMPRESSION: Trace pleural effusions and bibasilar  atelectasis.  No acute intra-abdominal abnormality.   Electronically Signed   By: Conchita Paris M.D.   On: 09/26/2014 19:32   Dg Chest 2 View  09/26/2014   CLINICAL DATA:  Chills, nausea, vomiting  EXAM: CHEST  2 VIEW  COMPARISON:  08/07/2013  FINDINGS: The heart size and mediastinal contours are within normal limits. Both lungs are clear. The visualized skeletal structures are unremarkable. Curvilinear presumed soft tissue artifact noted over the apices bilaterally. No pneumothorax. Cardiac leads obscure detail. Age indeterminate loss of height of a lumbar vertebral body seen on the lateral projection only.  IMPRESSION: No active cardiopulmonary disease.   Electronically Signed   By: Conchita Paris M.D.   On: 09/26/2014 18:50  EKG: Independently reviewed. LEA, no QTc or ST segment prolongation  Assessment/Plan Principal Problem:   Nausea vomiting and diarrhea Active Problems:   Hypothyroid   CKD (chronic kidney disease), stage III   Abdominal aortic aneurysm   Hypocalcemia   Elevated troponin   Peripheral neuropathy   Thrombocytopenia   UTI (urinary tract infection)  Nausea, vomiting and diarrhea: It is likely due to viral gastroenteritis. The patient is hemodynamically stable. Lactate 1.2, no tachycardia, not septic. Has a symptomatic hypocalcemia. - Will admit to tele bed given hypocalcemia - IFV: NS 75 cc/h - Zofran for nausea - replete Calcium - check GI pathogen panel - check C diff pcr  Elevated trop: trop is 0.34 on admission. No chest pain. his EKG has no ischemic change. It is likely due to muscle spasm secondary to hypocalcemia. One does of ASA was given by ED (patient is allergic to ASA, causing Low bp and stomach pain in the past). Cardiology was consulted by ED - trop x3  - repeat EKG in AM - follow up card recs  Hypocalcemia: likely multifactorial, including possible vitamin D deficiency given his history of chronic kidney disease, and new onset gastroenteritis.  Renal tube acidosis is another potential differential diagnosis. Patient received 1 g of calcium gluconate, but still symptomatically with hand shaking. - will give 2 g of calcium chloride - check vitamin D level, PTH, urine calcium level  - check Mg and phosphorus levels  -UA for urine pH - repeat BMP at 12:00 and in AM  UTI: patient was diagnosed with UTI by PCP, and given prescription for Bactrim, but patient did not pick up the prescription yet. He is still symptomatic with burning on urination. - IV Rocephin - follow-up urine culture.  CKD-III: creatinine is slightly higher than baseline. Likely due to urinary tract infection. - will treat UTI as above - check FeNa  Hypothyroidism: Last TSH was 4.625 11/27/12. On Synthroid at home. - Continue Synthroid - Check TSH  Anemia and thrombocytopenia:  Hgb=10.5 and 93 on admission. likely due to chronic disease anemia and chronic renal disease.  - will get anemia panel  Peripheral neuropathy: patient has chronic numbness in his legs and hands. In the past several days, patient's leg numbness has been getting worse, left is worse than right side. He has significantly decreased sensation on the left leg. He does not have weakness in his extremities. Hypo-caciumia may have contributed to the worsening leg numbness and decreased sensations. I have low suspicion for stroke. I would like to treat his hypocalcemia first, if symptoms persistent after correction of hypocalcemia, may consider to get an MRI of brain to rule out stroke. - check Vitamin b12 level (Anemia panel should include) - treat hypocalcemia as above.   DVT ppx: SCD (Thrombocytopenia)  Code Status: Full code Family Communication:  Yes, patient's  son  at bed side Disposition Plan: Admit to inpatient   Date of Service 09/26/2014    Ivor Costa [facility] Pager [phone]  If 7PM-7AM, please contact night-coverage [email or web address] Password [email or web address] 09/26/2014, 8:50 PM

## 2014-09-26 NOTE — ED Notes (Signed)
Pt c/o n/v/d, chills since last night. Went to PMD yesterday and was told he had low Vitamin D. Was given RX and did not get it filled. C/o L hip and L leg pain. Pain is chronic, but worse since yesterday. C/o trouble urinating and believes that something is wrong with his kidney function. C/o involuntary jerking in upper extremities and dizziness. No acute distress. Skin warm and dry.

## 2014-09-26 NOTE — Progress Notes (Signed)
CRITICAL VALUE ALERT  Critical value received:  Calcium 6.1  Date of notification:  09/26/14  Time of notification:  2316  Critical value read back:Yes.    Nurse who received alert:  Star Age, RN  MD notified (1st page):  M. Donnal Debar, NP  Time of first page:  1121  MD notified (2nd page):  Time of second page:  Responding MD:  M. Donnal Debar, NP  Time MD responded:  M. Donnal Debar, NP

## 2014-09-26 NOTE — ED Notes (Signed)
Called floor and notified floor that patient is coming up.

## 2014-09-26 NOTE — Progress Notes (Signed)
  CARE MANAGEMENT ED NOTE 09/26/2014  Patient:  [name], [name]   Account Number:  [id number]  Date Initiated:  09/26/2014  Documentation initiated by:  Livia Snellen  Subjective/Objective Assessment:   Patient presents to Ed with n/v/d/ and chills, jerking movements to upper extremities     Subjective/Objective Assessment Detail:   Patient with pmhx of hypothyroid, BPH, Renal insufficiency, HTN, Pancreatitis, AAA, peripheral neuropathies, arthritis.  Ca 6.3, Bun 41 Creat 2.50     Action/Plan:   Action/Plan Detail:   Anticipated DC Date:       Status Recommendation to Physician:   Result of Recommendation:    Other ED [place]  Other  PCP issues    Choice offered to / List presented to:            Status of service:  Completed, signed off  ED Comments:   ED Comments Detail:  EDCM spoke to patient and his son at bedside.  Patient does not have home health services currently.  Patient has had home health services in the past with [place].  Patient lives at home with his son.  Patient has  a walker and a shower bench at home.  Patient reports he can perform his own ADL's without difficulty.  Patient confirms his pcp is Dr. Redmond School. Patient saw his pcp on 11/05.  EDCM provided patient's son with list of home health agencies in [facility].  CM consult placed to follow for home health needs. No further EDCM needs at this time.

## 2014-09-26 NOTE — Telephone Encounter (Signed)
Pt states that he feels his whole body is vibrating, not sure if he has a fever, been throwing up and having diarrhea. Does not feel well and he wanted to know about his lab results but i told him Dr. Redmond School was not here but i would send a message back to find out. He said he can't wait til Monday to find out whats going on

## 2014-09-26 NOTE — ED Provider Notes (Signed)
CSN: [id number]     Arrival date & time 09/26/14  1634 History   First MD Initiated Contact with Patient 09/26/14 1647     Chief Complaint  Patient presents with  . N/V/D   . Leg Pain     (Consider location/radiation/quality/duration/timing/severity/associated sxs/prior Treatment) The history is provided by the patient.  Kolsen Iley. is a 80 y.o. male hx AAA, HTN, here with nausea, vomiting, and diarrhea and hypocalcemia. Having some nausea vomiting diarrhea since yesterday. Went to see PMD yesterday and was noted to have calcium 6.2. Still had persistent symptoms today and was called by his doctor to come to the ER for a reevaluation. Denies any fevers but was noted to have some shaking chills. Denies any chest pain or shortness of breath.    Past Medical History  Diagnosis Date  . Hypothyroid   . BPH (benign prostatic hyperplasia)   . Renal insufficiency   . Glucose intolerance (impaired glucose tolerance)   . Complication of anesthesia     "a tough time"  -pt unable to explain  . Hypertension   . Pancreatitis 15-20 yrs ago  . AAA (abdominal aortic aneurysm)   . Peripheral neuropathy     fingers and toes  . Arthritis    Past Surgical History  Procedure Laterality Date  . Abdominal aortic aneurysm repair  july 2011  . Prostate surgery  5 yrs ago    urethral dilation  . Amputation Right 01/03/2013    Procedure: AMPUTATION DIGIT;  Surgeon: Wylene Simmer, MD;  Location: [facility];  Service: Orthopedics;  Laterality: Right;  RIGHT 2ND TOE AMPUTATION  . Hip pinning,cannulated Left 08/08/2013    Procedure: LEFT CANNULATED HIP PINNING;  Surgeon: Rozanna Box, MD;  Location: [facility];  Service: Orthopedics;  Laterality: Left;  . Steriod injection Bilateral 08/08/2013    Procedure: STEROID INJECTION;  Surgeon: Rozanna Box, MD;  Location: [facility];  Service: Orthopedics;  Laterality: Bilateral;  . Total hip revision Left 02/10/2014    Procedure: REMOVAL OF SYNTHESE SCREWS LEFT HIP AND  CONVERSION LEFT TOTAL HIP ARTHROPLASTY;  Surgeon: Mauri Pole, MD;  Location: [facility];  Service: Orthopedics;  Laterality: Left;   Family History  Problem Relation Age of Onset  . Heart attack Mother   . Heart disease Mother   . Heart attack Father   . Diabetes Son     amputation-BKA    History  Substance Use Topics  . Smoking status: Former Smoker -- 0.30 packs/day for 60 years    Quit date: 10/02/2012  . Smokeless tobacco: Never Used  . Alcohol Use: Yes     Comment: occasioally    Review of Systems  Gastrointestinal: Positive for nausea, abdominal pain and diarrhea.  All other systems reviewed and are negative.     Allergies  Aspirin  Home Medications   Prior to Admission medications   Medication Sig Start Date End Date Taking? Authorizing Provider  levothyroxine (SYNTHROID, LEVOTHROID) 88 MCG tablet Take 1 tablet (88 mcg total) by mouth every morning. 11/29/13  Yes Denita Lung, MD  tamsulosin (FLOMAX) 0.4 MG CAPS capsule Take 0.4 mg by mouth daily as needed (patient preference).    Yes Historical Provider, MD  mupirocin ointment (BACTROBAN) 2 % Apply 1 application topically daily as needed (itching).  02/03/14   Historical Provider, MD  Neomy-Bacit-Polymyx-Pramoxine (TRIPLE ANTIBIOTIC PAIN RELIEF) 1 % OINT Apply 1 application topically daily as needed (to sore).    Historical Provider, MD  OVER THE COUNTER MEDICATION Take 4 tablets by mouth daily. [employer]    Historical Provider, MD  polyethylene glycol (MIRALAX / GLYCOLAX) packet Take 17 g by mouth 2 (two) times daily as needed. Patient states he only takes when he needs it.    Historical Provider, MD  sulfamethoxazole-trimethoprim (BACTRIM DS,SEPTRA DS) 800-160 MG per tablet Take 1 tablet by mouth 2 (two) times daily. Patient not taking: Reported on 09/26/2014 09/25/14   Denita Lung, MD  triamcinolone cream (KENALOG) 0.1 % Apply 1 application topically 2 (two) times daily as needed (itching).    Historical  Provider, MD  vitamin D, CHOLECALCIFEROL, 400 UNITS tablet Take 800 Units by mouth daily.    Historical Provider, MD  Vitamin D, Ergocalciferol, (DRISDOL) 50000 UNITS CAPS capsule Take 1 capsule (50,000 Units total) by mouth every 7 (seven) days. Patient not taking: Reported on 09/26/2014 09/26/14   Denita Lung, MD   BP 118/49 mmHg  Pulse 75  Temp(Src) 98.9 F (37.2 C) (Oral)  Resp 22  SpO2 98% Physical Exam  Constitutional: He is oriented to person, place, and time.  Chronically ill, slightly dehydrated   HENT:  Head: Normocephalic.  MM slightly dry   Eyes: Conjunctivae are normal. Pupils are equal, round, and reactive to light.  Neck: Normal range of motion. Neck supple.  Cardiovascular: Normal rate, regular rhythm and normal heart sounds.   Pulmonary/Chest: Effort normal and breath sounds normal. No respiratory distress. He has no wheezes. He has no rales.  Abdominal: Soft. Bowel sounds are normal.  Mild epigastric tenderness, no rebound   Musculoskeletal: Normal range of motion. He exhibits no edema or tenderness.  Neurological: He is alert and oriented to person, place, and time. No cranial nerve deficit. Coordination normal.  Skin: Skin is warm and dry.  Psychiatric: He has a normal mood and affect. His behavior is normal. Judgment and thought content normal.  Nursing note and vitals reviewed.   ED Course  Procedures (including critical care time)  CRITICAL CARE Performed by: Darl Householder, Ahlaya Ende   Total critical care time: 30 min   Critical care time was exclusive of separately billable procedures and treating other patients.  Critical care was necessary to treat or prevent imminent or life-threatening deterioration.  Critical care was time spent personally by me on the following activities: development of treatment plan with patient and/or surrogate as well as nursing, discussions with consultants, evaluation of patient's response to treatment, examination of patient,  obtaining history from patient or surrogate, ordering and performing treatments and interventions, ordering and review of laboratory studies, ordering and review of radiographic studies, pulse oximetry and re-evaluation of patient's condition.   Labs Review Labs Reviewed  CBC WITH DIFFERENTIAL - Abnormal; Notable for the following:    WBC 15.1 (*)    RBC 3.47 (*)    Hemoglobin 10.5 (*)    HCT 31.2 (*)    RDW 16.0 (*)    Platelets 93 (*)    All other components within normal limits  LIPASE, BLOOD - Abnormal; Notable for the following:    Lipase 5 (*)    All other components within normal limits  BASIC METABOLIC PANEL - Abnormal; Notable for the following:    Glucose, Bld 102 (*)    BUN 41 (*)    Creatinine, Ser 2.50 (*)    Calcium 6.3 (*)    GFR calc non Af Amer 23 (*)    GFR calc Af Amer 26 (*)    Anion  gap 18 (*)    All other components within normal limits  TROPONIN I - Abnormal; Notable for the following:    Troponin I 0.34 (*)    All other components within normal limits  I-STAT TROPOININ, ED - Abnormal; Notable for the following:    Troponin i, poc 0.21 (*)    All other components within normal limits  URINALYSIS, ROUTINE W REFLEX MICROSCOPIC  TSH  I-STAT CG4 LACTIC ACID, ED    Imaging Review Ct Abdomen Pelvis Wo Contrast  09/26/2014   CLINICAL DATA:  Mid abdominal pain, vomiting and diarrhea for 2 days  EXAM: CT ABDOMEN AND PELVIS WITHOUT CONTRAST  TECHNIQUE: Multidetector CT imaging of the abdomen and pelvis was performed following the standard protocol without IV contrast.  COMPARISON:  02/05/2009.  FINDINGS: Minimal dependent bibasilar atelectasis and trace pleural fluid noted.  2.4 cm cyst noted at the dome of the left hepatic lobe image 13. Unenhanced liver is otherwise unremarkable. Gallbladder is distended but otherwise unremarkable.  A complex lobulated fluid collection in communication with the proximal duodenum and immediately adjacent to the pancreatic head is  reidentified and unchanged allowing for differences in technique, again felt to most likely represent pseudocyst related to previous pancreatitis. No pancreatic ductal dilatation is identified but the pancreas is atrophic overall. No other peripancreatic fluid collection is identified.  Nodularity of the left adrenal gland is reidentified without measurable mass. Right adrenal gland is normal in appearance. Bilateral renal cortical are reidentified, largest 6 6 cm left lower renal pole image 32. Probable hyperdense 5 mm right lower renal pole cyst image 32, with a cyst previously seen in this location measuring similar attenuation allowing for volume averaging on prior exam. A second hyperdense probable renal cyst measuring 5 mm is noted at the right mid kidney image 27, stable.  Spleen is unremarkable. The patient has undergone interval abdominal aortic aneurysm repair. No periaortic fluid is identified. No aneurysmal dilatation. Curvilinear possible vascular calcification right upper quadrant image 20, not well evaluated without contrast.  Appendix is normal. No bowel wall thickening or focal segmental dilatation is identified. Detail in the pelvis is partly obscured by left hip arthroplasty streak artifact.  Trace perinephric fluid but no ascites identified. Bladder is unremarkable with the exception of wall thickness at upper limits of normal measuring 3 mm, unchanged. No pelvic lymphadenopathy.  Degenerative change noted in the spine. No acute osseous abnormality. Mild L2 compression deformity is noted, new since the prior study but otherwise age-indeterminate without bony retropulsion.  IMPRESSION: Trace pleural effusions and bibasilar atelectasis.  No acute intra-abdominal abnormality.   Electronically Signed   By: Conchita Paris M.D.   On: 09/26/2014 19:32   Dg Chest 2 View  09/26/2014   CLINICAL DATA:  Chills, nausea, vomiting  EXAM: CHEST  2 VIEW  COMPARISON:  08/07/2013  FINDINGS: The heart size and  mediastinal contours are within normal limits. Both lungs are clear. The visualized skeletal structures are unremarkable. Curvilinear presumed soft tissue artifact noted over the apices bilaterally. No pneumothorax. Cardiac leads obscure detail. Age indeterminate loss of height of a lumbar vertebral body seen on the lateral projection only.  IMPRESSION: No active cardiopulmonary disease.   Electronically Signed   By: Conchita Paris M.D.   On: 09/26/2014 18:50     EKG Interpretation   Date/Time:  Friday September 26 2014 17:54:35 EST Ventricular Rate:  65 PR Interval:  196 QRS Duration: 93 QT Interval:  472 QTC Calculation: 491 R Axis:   -  50 Text Interpretation:  Sinus rhythm Left anterior fascicular block  Borderline prolonged QT interval QT slightly longer than previous   Confirmed by Narjis Mira  MD, Zsazsa Bahena ([id number]) on 09/26/2014 5:57:22 PM      MDM   Final diagnoses:  Abdominal pain  Chills  Schaefer Osburn. is a 80 y.o. male here with ab pain, vomiting, diarrhea. Consider gastro vs SBO. Has hypocalcemia so will get trop and EKG. Will get CT ab/pel as well to further assess.   7:47 PM Calcium 6.2, supplemented. Troponin positive with QT prolongation. I think he has cardiac instability from hypocalcemia. No chest pain. Given ASA, held heparin. Cr. 2.5, inc since yesterday. WBC 15 that is new. I think likely from pyelo. CT ab/pel unremarkable. Given ceftriaxone. Will admit to tele. Hospitalist request cardiology to follow.     Wandra Arthurs, MD 09/26/14 [phone]

## 2014-09-26 NOTE — Telephone Encounter (Signed)
Spoke with Dr. Redmond School on phone. Since pt calcium is really low and pt is having chills and throwing up and diarrhea. Dr. Redmond School told me to have pt go to the ER to get checked out. Pt was not happy that he now has to go to the hospital as he states he does not have the money for it but i told him that they need to find out what is going on with him since he is not feeling well.

## 2014-09-26 NOTE — Telephone Encounter (Signed)
[facility] called stating that they  Had critical labs. Pt Calcium is critical low with 6.2% and it was repeated and verified.

## 2014-09-26 NOTE — Telephone Encounter (Signed)
LMOM FOR PT TO CB. CLS

## 2014-09-27 DIAGNOSIS — E039 Hypothyroidism, unspecified: Secondary | ICD-10-CM

## 2014-09-27 LAB — CBC
HCT: 28.9 % — ABNORMAL LOW (ref 39.0–52.0)
Hemoglobin: 9.8 g/dL — ABNORMAL LOW (ref 13.0–17.0)
MCH: 30 pg (ref 26.0–34.0)
MCHC: 33.9 g/dL (ref 30.0–36.0)
MCV: 88.4 fL (ref 78.0–100.0)
PLATELETS: 75 10*3/uL — AB (ref 150–400)
RBC: 3.27 MIL/uL — ABNORMAL LOW (ref 4.22–5.81)
RDW: 15.8 % — ABNORMAL HIGH (ref 11.5–15.5)
WBC: 9.8 10*3/uL (ref 4.0–10.5)

## 2014-09-27 LAB — IRON AND TIBC
Iron: 11 ug/dL — ABNORMAL LOW (ref 42–135)
Saturation Ratios: 5 % — ABNORMAL LOW (ref 20–55)
TIBC: 206 ug/dL — ABNORMAL LOW (ref 215–435)
UIBC: 195 ug/dL (ref 125–400)

## 2014-09-27 LAB — BASIC METABOLIC PANEL
ANION GAP: 16 — AB (ref 5–15)
BUN: 45 mg/dL — ABNORMAL HIGH (ref 6–23)
CALCIUM: 7.7 mg/dL — AB (ref 8.4–10.5)
CHLORIDE: 105 meq/L (ref 96–112)
CO2: 21 meq/L (ref 19–32)
Creatinine, Ser: 2.58 mg/dL — ABNORMAL HIGH (ref 0.50–1.35)
GFR calc Af Amer: 25 mL/min — ABNORMAL LOW (ref >90)
GFR calc non Af Amer: 22 mL/min — ABNORMAL LOW (ref >90)
Glucose, Bld: 72 mg/dL (ref 70–99)
POTASSIUM: 3.7 meq/L (ref 3.7–5.3)
SODIUM: 142 meq/L (ref 137–147)

## 2014-09-27 LAB — TROPONIN I

## 2014-09-27 LAB — FOLATE: Folate: >20 ng/mL

## 2014-09-27 LAB — VITAMIN D 25 HYDROXY (VIT D DEFICIENCY, FRACTURES)
VIT D 25 HYDROXY: 21 ng/mL — AB (ref 30–89)
Vit D, 25-Hydroxy: 17 ng/mL — ABNORMAL LOW (ref 30–89)

## 2014-09-27 LAB — VITAMIN B12: VITAMIN B 12: 453 pg/mL (ref 211–911)

## 2014-09-27 LAB — FERRITIN: FERRITIN: 138 ng/mL (ref 22–322)

## 2014-09-27 LAB — CLOSTRIDIUM DIFFICILE BY PCR: CDIFFPCR: NEGATIVE

## 2014-09-27 LAB — TSH: TSH: 4.2 u[IU]/mL (ref 0.350–4.500)

## 2014-09-27 LAB — GLUCOSE, CAPILLARY: GLUCOSE-CAPILLARY: 91 mg/dL (ref 70–99)

## 2014-09-27 MED ORDER — CALCIUM CARBONATE 1250 (500 CA) MG PO TABS
1.0000 | ORAL_TABLET | Freq: Two times a day (BID) | ORAL | Status: DC
  Administered 2014-09-27 – 2014-09-28 (×2): 500 mg via ORAL
  Filled 2014-09-27 (×4): qty 1

## 2014-09-27 NOTE — Progress Notes (Signed)
OT Cancellation Note  Patient Details Name: [name]. MRN: [id number] DOB: 04-May-1931   Cancelled Treatment:    Reason Eval/Treat Not Completed: OT screened, no needs identified, will sign off  [name] A 09/27/2014, 4:01 PM

## 2014-09-27 NOTE — Progress Notes (Signed)
PT Cancellation Note / Screen  Patient Details Name: [name]. MRN: [id number] DOB: [birth year]   Cancelled Treatment:    Reason Eval/Treat Not Completed: PT screened, no needs identified, will sign off  Discussed PT with pt at bedside.  Pt denies any PT needs at this time.  Pt reports he does well with his RW.  Since pt reports no needs and declines therapy at this time, PT will sign off.   Brendan Gruwell,KATHrine E 09/27/2014, 12:09 PM Carmelia Bake, PT, DPT 09/27/2014 Pager: [phone]

## 2014-09-27 NOTE — Progress Notes (Signed)
Pt admitted to the unit and transferred self to bed via stretcher. Oriented to room, call light, and staff. Bed in lowest position with siderails up. Call light within reach. VSS at this time. No distress noted, son at bedside. Will continue to monitor pt and carry out POC. Carnella Gu[place] I

## 2014-09-27 NOTE — Plan of Care (Signed)
Problem: Phase I Progression Outcomes Goal: Pain controlled with appropriate interventions Outcome: Progressing Goal: OOB as tolerated unless otherwise ordered Outcome: Completed/Met Date Met:  09/27/14 Goal: Initial discharge plan identified Outcome: Progressing Goal: Voiding-avoid urinary catheter unless indicated Outcome: Completed/Met Date Met:  09/27/14 Goal: Other Phase I Outcomes/Goals Outcome: Not Applicable Date Met:  64/40/34

## 2014-09-27 NOTE — Progress Notes (Signed)
[facility] PROGRESS NOTE  [name]. [id number] DOB: Jan 31, 1931 DOA: 09/26/2014 PCP: Wyatt Haste, MD  Assessment/Plan: 1-nausea, vomiting and diarrhea: due to UTI vs viral gastroenteritis  -improving and feeling better -no further N/V since admission -will continue IVF's resuscitation -advancing diet as tolerated -PRN antiemetics  2-hypocalcemia: received calcium gluconate X2 -repeated calcium 7.7 -will check albumin level -start maintenance calcium level with Oscal BID  3-hypokalemia: due to GI loses -repleted -will follow electrolytes  4-elevated troponin: appears to be due to acute on chronic renal failure -no EKG abnormalities for acute ischemic changes -no CP -repeat troponin neg X3  5-UTI: -follow urine cx -continue rocephin  6-acute on chronic renal failure: stage 3 at baseline -due to dehydration with increase GI loses and UTI -continue IVf's -continue abx's for UTI -follow renal function in am  7-hypothyroidism: TSH WNL -will continue synthroid  8-anemia of chronic disease: due to renal failure -no sings of acute bleeding -will follow hgb trend  9-Vit D deficiency: vit d level 17 -plan is to start high dose of vit D weekly at discharge; has been prescribed by PCP already  10-thrombocytopenia: maybe related to acute infection -no active bleeding -avoid heparin products   Code Status: full Family Communication: no family at bedside Disposition Plan: home when medically stable.   Consultants:  None   Procedures:  See below for x-ray reports   Antibiotics:  Rocephin   HPI/Subjective: Feeling better; no fever and currently w/o any further nausea or vomiting. C. Diff by PCR negative.  Objective: Filed Vitals:   09/27/14 1317  BP: 145/53  Pulse: 57  Temp: 98.3 F (36.8 C)  Resp: 18    Intake/Output Summary (Last 24 hours) at 09/27/14 1714 Last data filed at 09/27/14 0600  Gross per 24 hour  Intake 1903.75  ml  Output      0 ml  Net 1903.75 ml   Filed Weights   09/26/14 2050 09/27/14 0538  Weight: 77.111 kg (170 lb) 77.111 kg (170 lb)    Exam:   General:  Feeling better; no fever. Reports still some paresthesia on his legs  Cardiovascular: S1 and S2, no rubs or gallops; NSR on telemetry   Respiratory: CTA bilaterally  Abdomen: soft, NT, ND, positive BS  Musculoskeletal: no edema or cyanosis   Data Reviewed: Basic Metabolic Panel:  Recent Labs Lab 09/25/14 1159 09/26/14 1745 09/26/14 2158 09/26/14 2215 09/27/14 0300  NA 143 140  --  138 142  K 3.9 3.8  --  3.4* 3.7  CL 104 100  --  100 105  CO2 24 22  --  22 21  GLUCOSE 102* 102*  --  114* 72  BUN 28* 41*  --  42* 45*  CREATININE 1.89* 2.50*  --  2.50* 2.58*  CALCIUM 6.2* 6.3*  --  6.1* 7.7*  MG  --   --  1.2*  --   --   PHOS  --   --  3.4  --   --    Liver Function Tests:  Recent Labs Lab 09/25/14 1159  AST 13  ALT 12  ALKPHOS 107  BILITOT 0.6  PROT 6.2  ALBUMIN 3.6    Recent Labs Lab 09/26/14 1745  LIPASE 5*   CBC:  Recent Labs Lab 09/25/14 1159 09/26/14 1745 09/26/14 2155 09/27/14 0300  WBC 7.1 15.1* 11.0* 9.8  NEUTROABS 4.6 13.1* 9.2*  --   HGB 11.4* 10.5* 9.6* 9.8*  HCT 33.8* 31.2* 29.0* 28.9*  MCV 88.0 89.9 89.5 88.4  PLT 131* 93* 73* 75*   Cardiac Enzymes:  Recent Labs Lab 09/26/14 1715 09/26/14 2158 09/27/14 0300 09/27/14 0902  TROPONINI 0.34* <0.30 <0.30 <0.30   CBG:  Recent Labs Lab 09/27/14 0801  GLUCAP 91    Recent Results (from the past 240 hour(s))  CULTURE, URINE COMPREHENSIVE     Status: None (Preliminary result)   Collection Time: 09/25/14 11:59 AM  Result Value Ref Range Status   Colony Count >=100,000 COLONIES/ML  Preliminary   Preliminary Report Gram Negative Rods  Preliminary  Clostridium Difficile by PCR     Status: None   Collection Time: 09/26/14 11:21 PM  Result Value Ref Range Status   C difficile by pcr NEGATIVE NEGATIVE Final    Comment:  Performed at [facility]     Studies: Ct Abdomen Pelvis Wo Contrast  09/26/2014   CLINICAL DATA:  Mid abdominal pain, vomiting and diarrhea for 2 days  EXAM: CT ABDOMEN AND PELVIS WITHOUT CONTRAST  TECHNIQUE: Multidetector CT imaging of the abdomen and pelvis was performed following the standard protocol without IV contrast.  COMPARISON:  02/05/2009.  FINDINGS: Minimal dependent bibasilar atelectasis and trace pleural fluid noted.  2.4 cm cyst noted at the dome of the left hepatic lobe image 13. Unenhanced liver is otherwise unremarkable. Gallbladder is distended but otherwise unremarkable.  A complex lobulated fluid collection in communication with the proximal duodenum and immediately adjacent to the pancreatic head is reidentified and unchanged allowing for differences in technique, again felt to most likely represent pseudocyst related to previous pancreatitis. No pancreatic ductal dilatation is identified but the pancreas is atrophic overall. No other peripancreatic fluid collection is identified.  Nodularity of the left adrenal gland is reidentified without measurable mass. Right adrenal gland is normal in appearance. Bilateral renal cortical are reidentified, largest 6 6 cm left lower renal pole image 32. Probable hyperdense 5 mm right lower renal pole cyst image 32, with a cyst previously seen in this location measuring similar attenuation allowing for volume averaging on prior exam. A second hyperdense probable renal cyst measuring 5 mm is noted at the right mid kidney image 27, stable.  Spleen is unremarkable. The patient has undergone interval abdominal aortic aneurysm repair. No periaortic fluid is identified. No aneurysmal dilatation. Curvilinear possible vascular calcification right upper quadrant image 20, not well evaluated without contrast.  Appendix is normal. No bowel wall thickening or focal segmental dilatation is identified. Detail in the pelvis is partly obscured by left hip  arthroplasty streak artifact.  Trace perinephric fluid but no ascites identified. Bladder is unremarkable with the exception of wall thickness at upper limits of normal measuring 3 mm, unchanged. No pelvic lymphadenopathy.  Degenerative change noted in the spine. No acute osseous abnormality. Mild L2 compression deformity is noted, new since the prior study but otherwise age-indeterminate without bony retropulsion.  IMPRESSION: Trace pleural effusions and bibasilar atelectasis.  No acute intra-abdominal abnormality.   Electronically Signed   By: Conchita Paris M.D.   On: 09/26/2014 19:32   Dg Chest 2 View  09/26/2014   CLINICAL DATA:  Chills, nausea, vomiting  EXAM: CHEST  2 VIEW  COMPARISON:  08/07/2013  FINDINGS: The heart size and mediastinal contours are within normal limits. Both lungs are clear. The visualized skeletal structures are unremarkable. Curvilinear presumed soft tissue artifact noted over the apices bilaterally. No pneumothorax. Cardiac leads obscure detail. Age indeterminate loss of height of a lumbar vertebral body seen on the  lateral projection only.  IMPRESSION: No active cardiopulmonary disease.   Electronically Signed   By: Conchita Paris M.D.   On: 09/26/2014 18:50    Scheduled Meds: . calcium carbonate  1 tablet Oral BID WC  . cefTRIAXone (ROCEPHIN)  IV  1 g Intravenous Q24H  . levothyroxine  88 mcg Oral QAC breakfast  . sodium chloride  3 mL Intravenous Q12H   Continuous Infusions:   Principal Problem:   Nausea vomiting and diarrhea Active Problems:   Hypothyroid   CKD (chronic kidney disease), stage III   Abdominal aortic aneurysm   Hypocalcemia   Elevated troponin   Peripheral neuropathy   Thrombocytopenia   UTI (urinary tract infection)    Time spent: 30 minutes   Barton Dubois  [facility] Pager [phone]. If 7PM-7AM, please contact night-coverage at [email or web address], password [facility] 09/27/2014, 5:14 PM  LOS: 1 day

## 2014-09-28 DIAGNOSIS — R7989 Other specified abnormal findings of blood chemistry: Secondary | ICD-10-CM

## 2014-09-28 DIAGNOSIS — R778 Other specified abnormalities of plasma proteins: Secondary | ICD-10-CM | POA: Insufficient documentation

## 2014-09-28 LAB — COMPREHENSIVE METABOLIC PANEL
ALT: 15 U/L (ref 0–53)
AST: 19 U/L (ref 0–37)
Albumin: 2.8 g/dL — ABNORMAL LOW (ref 3.5–5.2)
Alkaline Phosphatase: 80 U/L (ref 39–117)
Anion gap: 13 (ref 5–15)
BUN: 42 mg/dL — ABNORMAL HIGH (ref 6–23)
CALCIUM: 7.3 mg/dL — AB (ref 8.4–10.5)
CO2: 22 mEq/L (ref 19–32)
Chloride: 104 mEq/L (ref 96–112)
Creatinine, Ser: 2.59 mg/dL — ABNORMAL HIGH (ref 0.50–1.35)
GFR, EST AFRICAN AMERICAN: 25 mL/min — AB (ref >90)
GFR, EST NON AFRICAN AMERICAN: 22 mL/min — AB (ref >90)
GLUCOSE: 103 mg/dL — AB (ref 70–99)
Potassium: 3.6 mEq/L — ABNORMAL LOW (ref 3.7–5.3)
SODIUM: 139 meq/L (ref 137–147)
TOTAL PROTEIN: 6 g/dL (ref 6.0–8.3)
Total Bilirubin: 0.3 mg/dL (ref 0.3–1.2)

## 2014-09-28 LAB — GLUCOSE, CAPILLARY: Glucose-Capillary: 111 mg/dL — ABNORMAL HIGH (ref 70–99)

## 2014-09-28 LAB — CULTURE, URINE COMPREHENSIVE

## 2014-09-28 MED ORDER — CALCIUM CARBONATE 1250 (500 CA) MG PO TABS: 1.0000 | ORAL_TABLET | Freq: Two times a day (BID) | ORAL | Status: DC

## 2014-09-28 MED ORDER — TAMSULOSIN HCL 0.4 MG PO CAPS: 0.4000 mg | ORAL_CAPSULE | Freq: Every day | ORAL | Status: DC

## 2014-09-28 MED ORDER — POLYETHYLENE GLYCOL 3350 17 G PO PACK: 17.0000 g | PACK | Freq: Every day | ORAL | Status: DC | PRN

## 2014-09-28 MED ORDER — CEFUROXIME AXETIL 500 MG PO TABS: 500.0000 mg | ORAL_TABLET | Freq: Two times a day (BID) | ORAL | Status: DC

## 2014-09-28 NOTE — Plan of Care (Signed)
Problem: Phase I Progression Outcomes Goal: Hemodynamically stable Outcome: Completed/Met Date Met:  09/28/14

## 2014-09-28 NOTE — Progress Notes (Signed)
CARE MANAGEMENT NOTE 09/28/2014  Patient:  [name], [name]   Account Number:  [id number]  Date Initiated:  09/28/2014  Documentation initiated by:  [facility]  Subjective/Objective Assessment:   N/V     Action/Plan:   lives with son, Cecilie Lowers   Anticipated DC Date:  09/28/2014   Anticipated DC Plan:  [place]  CM consult      Choice offered to / List presented to:             Status of service:  Completed, signed off Medicare Important Message given?  NA - LOS <3 / Initial given by admissions (If response is "NO", the following Medicare IM given date fields will be blank) Date Medicare IM given:   Medicare IM given by:   Date Additional Medicare IM given:   Additional Medicare IM given by:    Discharge Disposition:  HOME/SELF CARE  Per UR Regulation:    If discussed at Long Length of Stay Meetings, dates discussed:    Comments:  09/28/2014 1400 NCM spoke to pt and states he can manage at home. He has RW at home. Lives with son, Cecilie Lowers who can assist with his care at home. Pt states he can do his ADL at home. Jonnie Finner RN CCM Case Mgmt phone [phone]

## 2014-09-28 NOTE — Discharge Summary (Signed)
Physician Discharge Summary  [name]. [id number] DOB: Feb 14, 1931 DOA: 09/26/2014  PCP: Wyatt Haste, MD  Admit date: 09/26/2014 Discharge date: 09/28/2014  Time spent: >30 minutes  Recommendations for Outpatient Follow-up:  Follow final urine cx speciation and sensitivity; adjust antibiotics as needed BMET to follow electrolytes and renal function CBC to follow Hgb and platelets trend Follow PTH results and further work up (for secondary hyperparathyroidism) if abnormal   Discharge Diagnoses:  Principal Problem:   Nausea vomiting and diarrhea Active Problems:   Hypothyroid   CKD (chronic kidney disease), stage III   Abdominal aortic aneurysm   Hypocalcemia   Elevated troponin   Peripheral neuropathy   Thrombocytopenia   UTI (urinary tract infection)   Discharge Condition: stable and improved. Discharge home with family care and assistance. Will follow with PCP in 10 days.  Diet recommendation: low sodium diet (given renal failure)   Filed Weights   09/26/14 2050 09/27/14 0538 09/28/14 0658  Weight: 77.111 kg (170 lb) 77.111 kg (170 lb) 77.973 kg (171 lb 14.4 oz)    History of present illness:  Patient with PMH significant for BPH, hypothyroidism, borderline diabetes type 2, CKD-III and AAA; who presents with nausea, vomiting or diarrhea. Patient denies hematochezia, melena, hematemesis or any other disorder of acute bleeding. He was found to be dehydrated, with hypokalemia and hypocalcemia; also with acute on chronic renal failure and signs of UTI. Admitted for further evaluation and treatment.  Hospital Course:  1-nausea, vomiting and diarrhea: due to UTI vs viral gastroenteritis  -improved and feeling a lot better; demanding to go home -no further N/V or diarrhea since admission -tolerating diet and with good appetite at discharge -will discharge home on ceftin for UTI  2-hypocalcemia: received calcium gluconate X2 -repeated calcium 7.3-7.7 and  corrected calcium was 8.3 for albumin of 2.8 -start maintenance calcium level with Oscal BID -PTH remains pending; PCP to follow levels, but suspect secondary hyperparathyroidism   3-hypokalemia: due to GI loses -repleted  4-elevated troponin: appears to be due to acute on chronic renal failure -no EKG abnormalities for acute ischemic changes -no CP -repeated troponin neg X3  5-Gram neg Rods UTI: denies dysuria at discharge -follow urine cx speciation and sensitivity  -as requested by patient; will discharge home on ceftin BID -no bactrim due to renal failure  6-acute on chronic renal failure: stage 3 at baseline -due to dehydration with increase GI loses and UTI -urine output has improved after IVF's given -continue abx's for UTI -follow renal function as an outpatient in 10 days -patient advise to keep himself well hydrated and to avoid nephrotoxic agents   7-hypothyroidism: TSH WNL -will continue synthroid  8-anemia of chronic disease: due to renal failure most likely -no sings of acute bleeding appreciated -CBC to follow hgb trend  9-Vit D deficiency: vit d level 17 -plan is to start high dose of vit D weekly at discharge; has been prescribed by PCP already  10-thrombocytopenia: maybe related to acute infection; vs decrease BM production  -no active bleeding appreciated -heparin products avoided during admission -patient advise to avoid use of NSAID's -CBC to be repeated as an outpatient to follow trend  Procedures: See below for x-ray reports  Consultations:  None   Discharge Exam: Filed Vitals:   09/28/14 0658  BP: 130/60  Pulse: 67  Temp: 98.2 F (36.8 C)  Resp: 18    General: Feeling better; no fever. Reports paresthesia on his legs pretty much completely resolved. Asking to  go home.  Cardiovascular: S1 and S2, no rubs or gallops; NSR on telemetry   Respiratory: CTA bilaterally  Abdomen: soft, NT, ND, positive BS  Musculoskeletal: no edema or  cyanosis   Discharge Instructions You were cared for by a hospitalist during your hospital stay. If you have any questions about your discharge medications or the care you received while you were in the hospital after you are discharged, you can call the unit and asked to speak with the hospitalist on call if the hospitalist that took care of you is not available. Once you are discharged, your primary care physician will handle any further medical issues. Please note that NO REFILLS for any discharge medications will be authorized once you are discharged, as it is imperative that you return to your primary care physician (or establish a relationship with a primary care physician if you do not have one) for your aftercare needs so that they can reassess your need for medications and monitor your lab values.  Discharge Instructions    Diet - low sodium heart healthy    Complete by:  As directed      Discharge instructions    Complete by:  As directed   Keep yourself well hydrated Take medications as prescribed Arrange follow up with PCP in 10 days Use walker during ambulation to prevent/decrease falls          Current Discharge Medication List    START taking these medications   Details  calcium carbonate (OS-CAL - DOSED IN MG OF ELEMENTAL CALCIUM) 1250 MG tablet Take 1 tablet (500 mg of elemental calcium total) by mouth 2 (two) times daily with a meal. Qty: 60 tablet, Refills: 1    cefUROXime (CEFTIN) 500 MG tablet Take 1 tablet (500 mg total) by mouth 2 (two) times daily with a meal. Qty: 16 tablet, Refills: 0      CONTINUE these medications which have CHANGED   Details  polyethylene glycol (MIRALAX / GLYCOLAX) packet Take 17 g by mouth daily as needed. Patient states he only takes when he needs it.    tamsulosin (FLOMAX) 0.4 MG CAPS capsule Take 1 capsule (0.4 mg total) by mouth daily after supper. Qty: 30 capsule, Refills: 1      CONTINUE these medications which have NOT  CHANGED   Details  levothyroxine (SYNTHROID, LEVOTHROID) 88 MCG tablet Take 1 tablet (88 mcg total) by mouth every morning. Qty: 90 tablet, Refills: 3    mupirocin ointment (BACTROBAN) 2 % Apply 1 application topically daily as needed (itching).     Neomy-Bacit-Polymyx-Pramoxine (TRIPLE ANTIBIOTIC PAIN RELIEF) 1 % OINT Apply 1 application topically daily as needed (to sore).    OVER THE COUNTER MEDICATION Take 4 tablets by mouth daily. [employer]    triamcinolone cream (KENALOG) 0.1 % Apply 1 application topically 2 (two) times daily as needed (itching).    vitamin D, CHOLECALCIFEROL, 400 UNITS tablet Take 800 Units by mouth daily.    Vitamin D, Ergocalciferol, (DRISDOL) 50000 UNITS CAPS capsule Take 1 capsule (50,000 Units total) by mouth every 7 (seven) days. Qty: 30 capsule, Refills: 0      STOP taking these medications     sulfamethoxazole-trimethoprim (BACTRIM DS,SEPTRA DS) 800-160 MG per tablet        Allergies  Allergen Reactions  . Aspirin Other (See Comments)    Low bp, stomach pains   Follow-up Information    Follow up with Wyatt Haste, MD. Schedule an appointment as soon as possible  for a visit in 10 days.   Specialty:  Family Medicine   Contact information:   [place] [address] [phone]        The results of significant diagnostics from this hospitalization (including imaging, microbiology, ancillary and laboratory) are listed below for reference.    Significant Diagnostic Studies: Ct Abdomen Pelvis Wo Contrast  09/26/2014   CLINICAL DATA:  Mid abdominal pain, vomiting and diarrhea for 2 days  EXAM: CT ABDOMEN AND PELVIS WITHOUT CONTRAST  TECHNIQUE: Multidetector CT imaging of the abdomen and pelvis was performed following the standard protocol without IV contrast.  COMPARISON:  02/05/2009.  FINDINGS: Minimal dependent bibasilar atelectasis and trace pleural fluid noted.  2.4 cm cyst noted at the dome of the left  hepatic lobe image 13. Unenhanced liver is otherwise unremarkable. Gallbladder is distended but otherwise unremarkable.  A complex lobulated fluid collection in communication with the proximal duodenum and immediately adjacent to the pancreatic head is reidentified and unchanged allowing for differences in technique, again felt to most likely represent pseudocyst related to previous pancreatitis. No pancreatic ductal dilatation is identified but the pancreas is atrophic overall. No other peripancreatic fluid collection is identified.  Nodularity of the left adrenal gland is reidentified without measurable mass. Right adrenal gland is normal in appearance. Bilateral renal cortical are reidentified, largest 6 6 cm left lower renal pole image 32. Probable hyperdense 5 mm right lower renal pole cyst image 32, with a cyst previously seen in this location measuring similar attenuation allowing for volume averaging on prior exam. A second hyperdense probable renal cyst measuring 5 mm is noted at the right mid kidney image 27, stable.  Spleen is unremarkable. The patient has undergone interval abdominal aortic aneurysm repair. No periaortic fluid is identified. No aneurysmal dilatation. Curvilinear possible vascular calcification right upper quadrant image 20, not well evaluated without contrast.  Appendix is normal. No bowel wall thickening or focal segmental dilatation is identified. Detail in the pelvis is partly obscured by left hip arthroplasty streak artifact.  Trace perinephric fluid but no ascites identified. Bladder is unremarkable with the exception of wall thickness at upper limits of normal measuring 3 mm, unchanged. No pelvic lymphadenopathy.  Degenerative change noted in the spine. No acute osseous abnormality. Mild L2 compression deformity is noted, new since the prior study but otherwise age-indeterminate without bony retropulsion.  IMPRESSION: Trace pleural effusions and bibasilar atelectasis.  No acute  intra-abdominal abnormality.   Electronically Signed   By: Conchita Paris M.D.   On: 09/26/2014 19:32   Dg Chest 2 View  09/26/2014   CLINICAL DATA:  Chills, nausea, vomiting  EXAM: CHEST  2 VIEW  COMPARISON:  08/07/2013  FINDINGS: The heart size and mediastinal contours are within normal limits. Both lungs are clear. The visualized skeletal structures are unremarkable. Curvilinear presumed soft tissue artifact noted over the apices bilaterally. No pneumothorax. Cardiac leads obscure detail. Age indeterminate loss of height of a lumbar vertebral body seen on the lateral projection only.  IMPRESSION: No active cardiopulmonary disease.   Electronically Signed   By: Conchita Paris M.D.   On: 09/26/2014 18:50    Microbiology: Recent Results (from the past 240 hour(s))  CULTURE, URINE COMPREHENSIVE     Status: None (Preliminary result)   Collection Time: 09/25/14 11:59 AM  Result Value Ref Range Status   Colony Count >=100,000 COLONIES/ML  Preliminary   Preliminary Report Gram Negative Rods  Preliminary  Clostridium Difficile by PCR  Status: None   Collection Time: 09/26/14 11:21 PM  Result Value Ref Range Status   C difficile by pcr NEGATIVE NEGATIVE Final    Comment: Performed at [facility]: Basic Metabolic Panel:  Recent Labs Lab 09/25/14 1159 09/26/14 1745 09/26/14 2158 09/26/14 2215 09/27/14 0300 09/28/14 0645  NA 143 140  --  138 142 139  K 3.9 3.8  --  3.4* 3.7 3.6*  CL 104 100  --  100 105 104  CO2 24 22  --  22 21 22   GLUCOSE 102* 102*  --  114* 72 103*  BUN 28* 41*  --  42* 45* 42*  CREATININE 1.89* 2.50*  --  2.50* 2.58* 2.59*  CALCIUM 6.2* 6.3*  --  6.1* 7.7* 7.3*  MG  --   --  1.2*  --   --   --   PHOS  --   --  3.4  --   --   --    Liver Function Tests:  Recent Labs Lab 09/25/14 1159 09/28/14 0645  AST 13 19  ALT 12 15  ALKPHOS 107 80  BILITOT 0.6 0.3  PROT 6.2 6.0  ALBUMIN 3.6 2.8*    Recent Labs Lab 09/26/14 1745  LIPASE 5*    CBC:  Recent Labs Lab 09/25/14 1159 09/26/14 1745 09/26/14 2155 09/27/14 0300  WBC 7.1 15.1* 11.0* 9.8  NEUTROABS 4.6 13.1* 9.2*  --   HGB 11.4* 10.5* 9.6* 9.8*  HCT 33.8* 31.2* 29.0* 28.9*  MCV 88.0 89.9 89.5 88.4  PLT 131* 93* 73* 75*   Cardiac Enzymes:  Recent Labs Lab 09/26/14 1715 09/26/14 2158 09/27/14 0300 09/27/14 0902  TROPONINI 0.34* <0.30 <0.30 <0.30   CBG:  Recent Labs Lab 09/27/14 0801 09/28/14 0757  GLUCAP 91 111*    Signed:  Barton Dubois  [facility] 09/28/2014, 2:16 PM

## 2014-09-29 LAB — URINE CULTURE: Colony Count: 50000

## 2014-09-29 LAB — GI PATHOGEN PANEL BY PCR, STOOL
C DIFFICILE TOXIN A/B: NEGATIVE
Campylobacter by PCR: NEGATIVE
Cryptosporidium by PCR: NEGATIVE
E COLI (STEC): NEGATIVE
E coli (ETEC) LT/ST: NEGATIVE
E coli 0157 by PCR: NEGATIVE
G LAMBLIA BY PCR: NEGATIVE
Norovirus GI/GII: NEGATIVE
Rotavirus A by PCR: NEGATIVE
Salmonella by PCR: NEGATIVE
Shigella by PCR: NEGATIVE

## 2014-09-29 LAB — CREATININE, URINE, RANDOM: Creatinine, Urine: 58.9 mg/dL

## 2014-09-29 LAB — SODIUM, URINE, RANDOM: Sodium, Ur: 66 mEq/L

## 2014-09-29 LAB — CALCIUM, URINE, RANDOM: Calcium, Ur: 1 mg/dL

## 2014-10-03 LAB — CULTURE, BLOOD (ROUTINE X 2)
Culture: NO GROWTH
Culture: NO GROWTH

## 2014-10-06 ENCOUNTER — Inpatient Hospital Stay (HOSPITAL_COMMUNITY)
Admission: RE | Admit: 2014-10-06 | Discharge: 2014-10-06 | Disposition: A | Discharge status: Home / Self Care | Payer: Medicare Other | Source: Ambulatory Visit

## 2014-10-06 ENCOUNTER — Ambulatory Visit: Payer: Medicare Other | Admitting: Family

## 2014-10-06 DIAGNOSIS — I714 Abdominal aortic aneurysm, without rupture, unspecified: Secondary | ICD-10-CM

## 2014-10-06 DIAGNOSIS — Z48812 Encounter for surgical aftercare following surgery on the circulatory system: Secondary | ICD-10-CM

## 2014-10-30 ENCOUNTER — Encounter (HOSPITAL_COMMUNITY): Payer: Self-pay | Admitting: Surgery

## 2014-11-06 ENCOUNTER — Encounter: Payer: Self-pay | Admitting: Family Medicine

## 2014-11-06 ENCOUNTER — Ambulatory Visit (INDEPENDENT_AMBULATORY_CARE_PROVIDER_SITE_OTHER): Payer: Medicare Other | Admitting: Family Medicine

## 2014-11-06 DIAGNOSIS — Z9660 Presence of unspecified orthopedic joint implant: Secondary | ICD-10-CM

## 2014-11-06 DIAGNOSIS — D696 Thrombocytopenia, unspecified: Secondary | ICD-10-CM

## 2014-11-06 DIAGNOSIS — Z96649 Presence of unspecified artificial hip joint: Secondary | ICD-10-CM

## 2014-11-06 DIAGNOSIS — E039 Hypothyroidism, unspecified: Secondary | ICD-10-CM

## 2014-11-06 DIAGNOSIS — E559 Vitamin D deficiency, unspecified: Secondary | ICD-10-CM

## 2014-11-06 DIAGNOSIS — N183 Chronic kidney disease, stage 3 unspecified: Secondary | ICD-10-CM

## 2014-11-06 LAB — POCT URINALYSIS DIPSTICK
BILIRUBIN UA: NEGATIVE
Blood, UA: NEGATIVE
Glucose, UA: NEGATIVE
KETONES UA: NEGATIVE
LEUKOCYTES UA: NEGATIVE
Nitrite, UA: NEGATIVE
Protein, UA: NEGATIVE
SPEC GRAV UA: 1.02
Urobilinogen, UA: NEGATIVE
pH, UA: 6

## 2014-11-06 LAB — CBC WITH DIFFERENTIAL/PLATELET
Basophils Absolute: 0.1 10*3/uL (ref 0.0–0.1)
Basophils Relative: 1 % (ref 0–1)
Eosinophils Absolute: 0.2 10*3/uL (ref 0.0–0.7)
Eosinophils Relative: 3 % (ref 0–5)
HCT: 34.7 % — ABNORMAL LOW (ref 39.0–52.0)
HEMOGLOBIN: 11.4 g/dL — AB (ref 13.0–17.0)
LYMPHS PCT: 30 % (ref 12–46)
Lymphs Abs: 1.7 10*3/uL (ref 0.7–4.0)
MCH: 29.2 pg (ref 26.0–34.0)
MCHC: 32.9 g/dL (ref 30.0–36.0)
MCV: 89 fL (ref 78.0–100.0)
MONO ABS: 0.5 10*3/uL (ref 0.1–1.0)
MPV: 10.3 fL (ref 9.4–12.4)
Monocytes Relative: 9 % (ref 3–12)
NEUTROS ABS: 3.2 10*3/uL (ref 1.7–7.7)
Neutrophils Relative %: 57 % (ref 43–77)
PLATELETS: 140 10*3/uL — AB (ref 150–400)
RBC: 3.9 MIL/uL — AB (ref 4.22–5.81)
RDW: 16.8 % — ABNORMAL HIGH (ref 11.5–15.5)
WBC: 5.6 10*3/uL (ref 4.0–10.5)

## 2014-11-06 NOTE — Progress Notes (Signed)
   Subjective:    Patient ID: Christian Daft., male    DOB: 08-03-1931, 80 y.o.   MRN: [id number]  HPI He is here for general medication recheck. His main complaint today was difficult to fully assess as he states he was having left hip pain as well as numbness. He states he has pain in the hip, knees and then stated he had it in his entire leg. He also complains of a cramping sensation. He has not gotten follow-up with his orthopedic surgeon, Dr. Alvan Dame due to cost. He was recently in the hospital and treated for gastric enteritis. He was also noted to have low calcium levels which are probably secondary to vitamin D deficiency. He was on 50,000 units of vitamin D and recently was on 800 units of vitamin D however he has not been on any in at least the last week. Is very difficult to get a good history from him. He is quite irritated over this but does not express any depressive symptoms other than anger. His physical activity level is limited due to the end using a walker. His weight is recorded. He's had no urinary symptoms recently. His medications were reviewed. Social and family history were also reviewed. His son is helping to take care of him.  Review of Systems  All other systems reviewed and are negative.      Objective:   Physical Exam alert and in no distress. Tympanic membranes and canals are normal. Throat is clear. Tonsils are normal. Neck is supple without adenopathy or thyromegaly. Cardiac exam shows a regular sinus rhythm without murmurs or gallops. Lungs are clear to auscultation. There is slight discomfort with left hip internal and external rotation but not flexion. Reflexes were slightly diminished. No palpable tenderness to his thighs, knees or calf. Review of the record indicates previous difficulty with thrombocytopenia .       Assessment & Plan:  Hypocalcemia - Plan: Comprehensive metabolic panel  CKD (chronic kidney disease), stage III - Plan: POCT Urinalysis  Dipstick, CBC with Differential, Comprehensive metabolic panel  Hypothyroidism, unspecified hypothyroidism type - Plan: TSH  S/P left hip revision  Thrombocytopenia  Vitamin D deficiency - Plan: Vit D  25 hydroxy (rtn osteoporosis monitoring)  is very difficult to get a good handle on this gentleman as he does express a great deal of anger but is very difficult to get a good history from due to his constantly changing his story. Immunizations were also offered however he refused all immunizations

## 2014-11-07 ENCOUNTER — Other Ambulatory Visit: Payer: Self-pay

## 2014-11-07 DIAGNOSIS — R799 Abnormal finding of blood chemistry, unspecified: Secondary | ICD-10-CM

## 2014-11-07 DIAGNOSIS — R7989 Other specified abnormal findings of blood chemistry: Secondary | ICD-10-CM

## 2014-11-07 LAB — COMPREHENSIVE METABOLIC PANEL
ALBUMIN: 3.8 g/dL (ref 3.5–5.2)
ALT: 14 U/L (ref 0–53)
AST: 16 U/L (ref 0–37)
Alkaline Phosphatase: 110 U/L (ref 39–117)
BUN: 32 mg/dL — AB (ref 6–23)
CALCIUM: 7.9 mg/dL — AB (ref 8.4–10.5)
CHLORIDE: 107 meq/L (ref 96–112)
CO2: 20 mEq/L (ref 19–32)
Creat: 1.55 mg/dL — ABNORMAL HIGH (ref 0.50–1.35)
Glucose, Bld: 105 mg/dL — ABNORMAL HIGH (ref 70–99)
POTASSIUM: 4.4 meq/L (ref 3.5–5.3)
Sodium: 137 mEq/L (ref 135–145)
Total Bilirubin: 0.5 mg/dL (ref 0.2–1.2)
Total Protein: 6.5 g/dL (ref 6.0–8.3)

## 2014-11-07 LAB — VITAMIN D 25 HYDROXY (VIT D DEFICIENCY, FRACTURES): Vit D, 25-Hydroxy: 12 ng/mL — ABNORMAL LOW (ref 30–100)

## 2014-11-07 LAB — TSH: TSH: 3.956 u[IU]/mL (ref 0.350–4.500)

## 2014-11-07 MED ORDER — VITAMIN D (ERGOCALCIFEROL) 1.25 MG (50000 UNIT) PO CAPS: 50000.0000 [IU] | ORAL_CAPSULE | ORAL | Status: DC

## 2014-12-02 ENCOUNTER — Telehealth: Payer: Self-pay | Admitting: Family Medicine

## 2014-12-02 MED ORDER — CALCIUM CARBONATE 1250 (500 CA) MG PO TABS: 1.0000 | ORAL_TABLET | Freq: Two times a day (BID) | ORAL | Status: DC

## 2014-12-02 MED ORDER — LEVOTHYROXINE SODIUM 88 MCG PO TABS: 88.0000 ug | ORAL_TABLET | Freq: Every morning | ORAL | Status: DC

## 2014-12-02 NOTE — Telephone Encounter (Signed)
Please call patient  He will run out of calcium carbonate today. He was given that in the hospital. He is not sure if you want him to keep taking that one or not He also needs refill on thyroid meds  [facility]

## 2014-12-17 ENCOUNTER — Telehealth: Payer: Self-pay | Admitting: Family Medicine

## 2014-12-17 MED ORDER — TAMSULOSIN HCL 0.4 MG PO CAPS: 0.4000 mg | ORAL_CAPSULE | Freq: Every day | ORAL | Status: DC

## 2014-12-17 MED ORDER — TRIAMCINOLONE ACETONIDE 0.1 % EX CREA: 1.0000 "application " | TOPICAL_CREAM | Freq: Two times a day (BID) | CUTANEOUS | Status: DC | PRN

## 2014-12-17 NOTE — Telephone Encounter (Signed)
Needs refill on flomax and triamcinolone cream  Sent to [facility]

## 2014-12-19 ENCOUNTER — Telehealth: Payer: Self-pay | Admitting: Internal Medicine

## 2014-12-19 MED ORDER — TAMSULOSIN HCL 0.4 MG PO CAPS: 0.4000 mg | ORAL_CAPSULE | Freq: Every day | ORAL | Status: DC

## 2014-12-19 MED ORDER — CALCIUM CARBONATE 1250 (500 CA) MG PO TABS: 1.0000 | ORAL_TABLET | Freq: Two times a day (BID) | ORAL | Status: DC

## 2014-12-19 NOTE — Telephone Encounter (Signed)
Sent both to pharmacy

## 2014-12-19 NOTE — Telephone Encounter (Signed)
Pt needs a refill on his calcuim and flomax. Both were printed on 1/12 so they were not sent to pharmacy

## 2015-01-01 ENCOUNTER — Other Ambulatory Visit: Payer: Commercial Managed Care - HMO

## 2015-01-01 DIAGNOSIS — R799 Abnormal finding of blood chemistry, unspecified: Secondary | ICD-10-CM

## 2015-01-01 DIAGNOSIS — R7989 Other specified abnormal findings of blood chemistry: Secondary | ICD-10-CM

## 2015-01-02 LAB — COMPREHENSIVE METABOLIC PANEL
ALBUMIN: 3.8 g/dL (ref 3.5–5.2)
ALK PHOS: 99 U/L (ref 39–117)
ALT: 25 U/L (ref 0–53)
AST: 24 U/L (ref 0–37)
BILIRUBIN TOTAL: 0.5 mg/dL (ref 0.2–1.2)
BUN: 37 mg/dL — AB (ref 6–23)
CO2: 19 mEq/L (ref 19–32)
Calcium: 7.5 mg/dL — ABNORMAL LOW (ref 8.4–10.5)
Chloride: 109 mEq/L (ref 96–112)
Creat: 2.03 mg/dL — ABNORMAL HIGH (ref 0.50–1.35)
Glucose, Bld: 120 mg/dL — ABNORMAL HIGH (ref 70–99)
POTASSIUM: 4.7 meq/L (ref 3.5–5.3)
SODIUM: 140 meq/L (ref 135–145)
Total Protein: 6.1 g/dL (ref 6.0–8.3)

## 2015-01-02 LAB — CBC WITH DIFFERENTIAL/PLATELET
BASOS ABS: 0.1 10*3/uL (ref 0.0–0.1)
BASOS PCT: 1 % (ref 0–1)
Eosinophils Absolute: 0.2 10*3/uL (ref 0.0–0.7)
Eosinophils Relative: 3 % (ref 0–5)
HCT: 35.4 % — ABNORMAL LOW (ref 39.0–52.0)
Hemoglobin: 11.8 g/dL — ABNORMAL LOW (ref 13.0–17.0)
Lymphocytes Relative: 31 % (ref 12–46)
Lymphs Abs: 1.8 10*3/uL (ref 0.7–4.0)
MCH: 29.2 pg (ref 26.0–34.0)
MCHC: 33.3 g/dL (ref 30.0–36.0)
MCV: 87.6 fL (ref 78.0–100.0)
MONO ABS: 0.5 10*3/uL (ref 0.1–1.0)
MPV: 10 fL (ref 8.6–12.4)
Monocytes Relative: 8 % (ref 3–12)
NEUTROS PCT: 57 % (ref 43–77)
Neutro Abs: 3.4 10*3/uL (ref 1.7–7.7)
PLATELETS: 111 10*3/uL — AB (ref 150–400)
RBC: 4.04 MIL/uL — ABNORMAL LOW (ref 4.22–5.81)
RDW: 16.5 % — AB (ref 11.5–15.5)
WBC: 5.9 10*3/uL (ref 4.0–10.5)

## 2015-01-02 LAB — VITAMIN D 25 HYDROXY (VIT D DEFICIENCY, FRACTURES): Vit D, 25-Hydroxy: 13 ng/mL — ABNORMAL LOW (ref 30–100)

## 2015-01-07 ENCOUNTER — Telehealth: Payer: Self-pay | Admitting: Family Medicine

## 2015-01-07 NOTE — Telephone Encounter (Signed)
Multiple issues/ requests  Patient is changing pharmacies to [facility] mail order  Phone # [phone]  He needs Rx's sent in for   flomax ( currently out of)            Levothyroxine           Kenalog cream            Calcium carbonate            Vitamin D  400 and 50,000    He needs to know if you want him to continue the calcium carbonate and vitamin D  He has been taking vitamin 2000 daily and 50,000 vitamin D weekly. His lab results were mailed to him but he is legally blind.  Please call patient with lab results and advised about medications

## 2015-01-08 MED ORDER — VITAMIN D 400 UNITS PO TABS: 800.0000 [IU] | ORAL_TABLET | Freq: Every day | ORAL | Status: DC

## 2015-01-08 MED ORDER — TRIAMCINOLONE ACETONIDE 0.1 % EX CREA: 1.0000 "application " | TOPICAL_CREAM | Freq: Two times a day (BID) | CUTANEOUS | Status: DC | PRN

## 2015-01-08 MED ORDER — LEVOTHYROXINE SODIUM 88 MCG PO TABS: 88.0000 ug | ORAL_TABLET | Freq: Every morning | ORAL | Status: DC

## 2015-01-08 MED ORDER — CALCIUM CARBONATE 1250 (500 CA) MG PO TABS: 1.0000 | ORAL_TABLET | Freq: Two times a day (BID) | ORAL | Status: DC

## 2015-01-08 MED ORDER — VITAMIN D (ERGOCALCIFEROL) 1.25 MG (50000 UNIT) PO CAPS: 50000.0000 [IU] | ORAL_CAPSULE | ORAL | Status: DC

## 2015-01-08 MED ORDER — TAMSULOSIN HCL 0.4 MG PO CAPS: 0.4000 mg | ORAL_CAPSULE | Freq: Every day | ORAL | Status: DC

## 2015-01-08 NOTE — Telephone Encounter (Signed)
Pt was also told meds were sent to the pharmacy

## 2015-01-08 NOTE — Telephone Encounter (Signed)
Let him know that he needs to continue on his vitamin D supplements that he is still low. Reinforced the fact that he needs to stay on these regularly. Have him come in for a recheck in a couple of months

## 2015-01-08 NOTE — Telephone Encounter (Signed)
Pt was notified that he needs to continue his vitamin D meds and he asked questions why it was low and i said because your calcium was still low so that's why your vitamin D is low, and by continuing med it will help with building up calcium and vitamin D levels.

## 2015-01-19 ENCOUNTER — Telehealth: Payer: Self-pay | Admitting: Family Medicine

## 2015-01-19 NOTE — Telephone Encounter (Signed)
Left message for pt to buy otc med

## 2015-01-19 NOTE — Telephone Encounter (Signed)
Pt received letter from [facility] mail order pharmacy stating that Calcium med is not available. Pt did not understand rest of letter enough to understand if this is a temporary issue or they will no longer have it but pt only has enough pills for a few day. What does Dr Redmond School suggest that he does? Pt previously bought this at [facility] and it was pretty expensive and sometimes they ran out of it.

## 2015-01-19 NOTE — Telephone Encounter (Signed)
Any over-the-counter calcium supplement will be fine

## 2015-02-11 ENCOUNTER — Telehealth: Payer: Self-pay | Admitting: Family Medicine

## 2015-02-11 NOTE — Telephone Encounter (Signed)
Go ahead and refer him 

## 2015-02-11 NOTE — Telephone Encounter (Signed)
Please call  He has"callous" on bottom of foot and he states there could be something inside of it but he cant tell due to his neuropathy. He is not able to see area to tell if it is red and not sure if warm to touch or not but he can feel something when he walks on it  Patient called requesting referral to podiatrist. He would like to be referred directly to podiatry due to the fact that he has transportation issues.

## 2015-02-16 ENCOUNTER — Other Ambulatory Visit: Payer: Self-pay

## 2015-02-16 DIAGNOSIS — M79673 Pain in unspecified foot: Secondary | ICD-10-CM

## 2015-02-16 NOTE — Telephone Encounter (Signed)
Done

## 2015-02-16 NOTE — Telephone Encounter (Signed)
I have put order in system

## 2015-02-27 ENCOUNTER — Ambulatory Visit (INDEPENDENT_AMBULATORY_CARE_PROVIDER_SITE_OTHER): Payer: Commercial Managed Care - HMO | Admitting: Podiatry

## 2015-02-27 ENCOUNTER — Encounter: Payer: Self-pay | Admitting: Podiatry

## 2015-02-27 VITALS — Ht 70.0 in | Wt 175.0 lb

## 2015-02-27 DIAGNOSIS — M79606 Pain in leg, unspecified: Secondary | ICD-10-CM

## 2015-02-27 DIAGNOSIS — B351 Tinea unguium: Secondary | ICD-10-CM | POA: Insufficient documentation

## 2015-02-27 NOTE — Progress Notes (Signed)
SUBJECTIVE: 81 y.o. year old male presents for diabetic foot care.  Been borderline diabetic for 20 some years and had neuropathy on both feet about that long. Both feet hurt. Patient is ambulatory and presented with his daughter.  Stated that his blood sugar is under control.   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   OBJECTIVE: DERMATOLOGIC EXAMINATION: Nails: Thick dystrophic nails x 10. Calluses: Thick painful callus under 3rd MPJ area right foot.  VASCULAR EXAMINATION OF LOWER LIMBS: Pedal pulses: are faintly palpable on both feet.   Capillary Filling times within 3 seconds in all digits.  No edema or ischemic changes noted. Temperature gradient from tibial crest to dorsum of foot is within normal bilateral. NEUROLOGIC EXAMINATION OF THE LOWER LIMBS: Monofilament (Semmes-Weinstein 10-gm) sensory testing failed to response 6 out of 6 points, bilateral. MUSCULOSKELETAL EXAMINATION: Absence of 2nd toe right, severe hammer toe deformity 3rd digit right, mild lesser digital contracture bilateral.   ASSESSMENT: Painful callus sub 3 right. Mycotic nails x 10. Peripheral neuropathy bilateral. Painful feet.  PLAN: Reviewed findings and available treatment options. All nails and calluses debrided.

## 2015-02-27 NOTE — Patient Instructions (Signed)
Seen for hypertrophic nails and painful callus on right foot. All nails and calluses debrided. Return in 3 months or as needed.

## 2015-03-12 ENCOUNTER — Telehealth: Payer: Self-pay | Admitting: Internal Medicine

## 2015-03-12 NOTE — Telephone Encounter (Signed)
Got a call from [facility] asking about records and why we have not sent them over yet. Pt has seen Hubbard Robinson- PA over there back on 02/18/15 and i told them that pt was seen recently and i would have to talk with pt first to see if he was transferring out or if he just went there for a one time appt. If he is transferring out here then i would send records.  Left a message for pt to call me back.

## 2015-04-08 ENCOUNTER — Telehealth: Payer: Self-pay | Admitting: Internal Medicine

## 2015-04-08 NOTE — Telephone Encounter (Signed)
Faxed over medical records [facility] on 03/30/15 @ [phone]

## 2015-05-29 ENCOUNTER — Encounter: Payer: Self-pay | Admitting: Podiatry

## 2015-05-29 ENCOUNTER — Ambulatory Visit (INDEPENDENT_AMBULATORY_CARE_PROVIDER_SITE_OTHER): Payer: Commercial Managed Care - HMO | Admitting: Podiatry

## 2015-05-29 VITALS — BP 196/89 | HR 69

## 2015-05-29 DIAGNOSIS — M79606 Pain in leg, unspecified: Secondary | ICD-10-CM | POA: Diagnosis not present

## 2015-05-29 DIAGNOSIS — B351 Tinea unguium: Secondary | ICD-10-CM | POA: Diagnosis not present

## 2015-05-29 NOTE — Progress Notes (Signed)
SUBJECTIVE: 81 y.o. year old male presents for diabetic foot care. .  Stated that his blood sugar is under control.   OBJECTIVE: DERMATOLOGIC EXAMINATION: Nails: Thick dystrophic nails x 10. Calluses: Thick painful callus under 3rd MPJ area right foot.  VASCULAR EXAMINATION OF LOWER LIMBS: Pedal pulses: are faintly palpable on both feet.  Capillary Filling times within 3 seconds in all digits.  No edema or ischemic changes noted. Temperature gradient from tibial crest to dorsum of foot is within normal bilateral. NEUROLOGIC EXAMINATION OF THE LOWER LIMBS: All epicritic and tactile sensations grossly intact.[name] EXAMINATION: Absence of 2nd toe right, severe hammer toe deformity 3rd digit right, mild lesser digital contracture bilateral.   ASSESSMENT: Painful callus sub 3 right. Mycotic nails x 10. Peripheral neuropathy bilateral. Painful feet.  PLAN: Reviewed findings and available treatment options. All nails and calluses debrided

## 2015-06-17 ENCOUNTER — Other Ambulatory Visit: Payer: Self-pay | Admitting: Family Medicine

## 2015-06-17 NOTE — Telephone Encounter (Signed)
Is this okay?

## 2015-08-18 ENCOUNTER — Encounter (HOSPITAL_COMMUNITY): Payer: Self-pay

## 2015-08-18 ENCOUNTER — Emergency Department (HOSPITAL_COMMUNITY)
Admission: EM | Admit: 2015-08-18 | Discharge: 2015-08-18 | Disposition: A | Discharge status: Home / Self Care | Payer: Medicare HMO | Attending: Emergency Medicine | Admitting: Emergency Medicine

## 2015-08-18 ENCOUNTER — Emergency Department (HOSPITAL_COMMUNITY): Payer: Medicare HMO

## 2015-08-18 DIAGNOSIS — Z79899 Other long term (current) drug therapy: Secondary | ICD-10-CM | POA: Diagnosis not present

## 2015-08-18 DIAGNOSIS — R079 Chest pain, unspecified: Secondary | ICD-10-CM | POA: Insufficient documentation

## 2015-08-18 DIAGNOSIS — N400 Enlarged prostate without lower urinary tract symptoms: Secondary | ICD-10-CM | POA: Insufficient documentation

## 2015-08-18 DIAGNOSIS — I10 Essential (primary) hypertension: Secondary | ICD-10-CM | POA: Diagnosis not present

## 2015-08-18 DIAGNOSIS — N289 Disorder of kidney and ureter, unspecified: Secondary | ICD-10-CM | POA: Insufficient documentation

## 2015-08-18 DIAGNOSIS — Z8719 Personal history of other diseases of the digestive system: Secondary | ICD-10-CM | POA: Diagnosis not present

## 2015-08-18 DIAGNOSIS — R1032 Left lower quadrant pain: Secondary | ICD-10-CM | POA: Diagnosis not present

## 2015-08-18 DIAGNOSIS — Z8669 Personal history of other diseases of the nervous system and sense organs: Secondary | ICD-10-CM | POA: Diagnosis not present

## 2015-08-18 DIAGNOSIS — Z87891 Personal history of nicotine dependence: Secondary | ICD-10-CM | POA: Insufficient documentation

## 2015-08-18 DIAGNOSIS — Z8739 Personal history of other diseases of the musculoskeletal system and connective tissue: Secondary | ICD-10-CM | POA: Diagnosis not present

## 2015-08-18 DIAGNOSIS — E039 Hypothyroidism, unspecified: Secondary | ICD-10-CM | POA: Diagnosis not present

## 2015-08-18 DIAGNOSIS — R1012 Left upper quadrant pain: Secondary | ICD-10-CM | POA: Diagnosis present

## 2015-08-18 LAB — CBC WITH DIFFERENTIAL/PLATELET
Basophils Absolute: 0 10*3/uL (ref 0.0–0.1)
Basophils Relative: 0 %
EOS PCT: 3 %
Eosinophils Absolute: 0.2 10*3/uL (ref 0.0–0.7)
HCT: 40 % (ref 39.0–52.0)
HEMOGLOBIN: 13.2 g/dL (ref 13.0–17.0)
LYMPHS ABS: 1.7 10*3/uL (ref 0.7–4.0)
LYMPHS PCT: 30 %
MCH: 30.9 pg (ref 26.0–34.0)
MCHC: 33 g/dL (ref 30.0–36.0)
MCV: 93.7 fL (ref 78.0–100.0)
MONOS PCT: 12 %
Monocytes Absolute: 0.7 10*3/uL (ref 0.1–1.0)
NEUTROS PCT: 55 %
Neutro Abs: 3.2 10*3/uL (ref 1.7–7.7)
Platelets: 113 10*3/uL — ABNORMAL LOW (ref 150–400)
RBC: 4.27 MIL/uL (ref 4.22–5.81)
RDW: 15.2 % (ref 11.5–15.5)
WBC: 5.7 10*3/uL (ref 4.0–10.5)

## 2015-08-18 LAB — I-STAT CHEM 8, ED
BUN: 33 mg/dL — AB (ref 6–20)
CALCIUM ION: 1.03 mmol/L — AB (ref 1.13–1.30)
CHLORIDE: 109 mmol/L (ref 101–111)
Creatinine, Ser: 2.1 mg/dL — ABNORMAL HIGH (ref 0.61–1.24)
GLUCOSE: 88 mg/dL (ref 65–99)
HCT: 40 % (ref 39.0–52.0)
Hemoglobin: 13.6 g/dL (ref 13.0–17.0)
Potassium: 4.3 mmol/L (ref 3.5–5.1)
Sodium: 146 mmol/L — ABNORMAL HIGH (ref 135–145)
TCO2: 24 mmol/L (ref 0–100)

## 2015-08-18 LAB — URINALYSIS, ROUTINE W REFLEX MICROSCOPIC
Bilirubin Urine: NEGATIVE
Glucose, UA: NEGATIVE mg/dL
Ketones, ur: NEGATIVE mg/dL
LEUKOCYTES UA: NEGATIVE
NITRITE: NEGATIVE
Protein, ur: 100 mg/dL — AB
SPECIFIC GRAVITY, URINE: 1.009 (ref 1.005–1.030)
Urobilinogen, UA: 0.2 mg/dL (ref 0.0–1.0)
pH: 7.5 (ref 5.0–8.0)

## 2015-08-18 LAB — URINE MICROSCOPIC-ADD ON

## 2015-08-18 LAB — I-STAT TROPONIN, ED: Troponin i, poc: 0.02 ng/mL (ref 0.00–0.08)

## 2015-08-18 MED ORDER — SODIUM CHLORIDE 0.9 % IV SOLN
Freq: Once | INTRAVENOUS | Status: AC
  Administered 2015-08-18: 03:00:00 via INTRAVENOUS

## 2015-08-18 NOTE — ED Provider Notes (Signed)
CSN: [id number]     Arrival date & time 08/18/15  0149 History   None    Chief Complaint  Patient presents with  . Chest Pain     (Consider location/radiation/quality/duration/timing/severity/associated sxs/prior Treatment) Patient is a 81 y.o. male presenting with chest pain. The history is provided by the patient.  Chest Pain Pain location:  L chest Pain quality: aching   Pain radiates to the back: no   Pain severity:  Mild Onset quality:  Gradual Timing:  Constant Progression:  Improving Chronicity:  New Context: at rest   Context: not breathing, not lifting and no movement   Relieved by:  Nothing Worsened by:  Nothing tried Ineffective treatments:  None tried Associated symptoms: abdominal pain   Associated symptoms: no back pain, no cough, no dizziness, no fever, no nausea, no palpitations, no shortness of breath, no syncope and not vomiting   Abdominal pain:    Location:  LUQ and LLQ   Quality:  Pressure   Severity:  Mild   Onset quality:  Gradual   Timing:  Constant   Progression:  Improving   Chronicity:  New Risk factors: coronary artery disease, hypertension and male sex     Past Medical History  Diagnosis Date  . Hypothyroid   . BPH (benign prostatic hyperplasia)   . Renal insufficiency   . Glucose intolerance (impaired glucose tolerance)   . Complication of anesthesia     "a tough time"  -pt unable to explain  . Hypertension   . Pancreatitis 15-20 yrs ago  . AAA (abdominal aortic aneurysm)   . Peripheral neuropathy     fingers and toes  . Arthritis    Past Surgical History  Procedure Laterality Date  . Abdominal aortic aneurysm repair  july 2011  . Prostate surgery  5 yrs ago    urethral dilation  . Amputation Right 01/03/2013    Procedure: AMPUTATION DIGIT;  Surgeon: Wylene Simmer, MD;  Location: [facility];  Service: Orthopedics;  Laterality: Right;  RIGHT 2ND TOE AMPUTATION  . Hip pinning,cannulated Left 08/08/2013    Procedure: LEFT CANNULATED HIP  PINNING;  Surgeon: Rozanna Box, MD;  Location: [facility];  Service: Orthopedics;  Laterality: Left;  . Steriod injection Bilateral 08/08/2013    Procedure: STEROID INJECTION;  Surgeon: Rozanna Box, MD;  Location: [facility];  Service: Orthopedics;  Laterality: Bilateral;  . Total hip revision Left 02/10/2014    Procedure: REMOVAL OF SYNTHESE SCREWS LEFT HIP AND CONVERSION LEFT TOTAL HIP ARTHROPLASTY;  Surgeon: Mauri Pole, MD;  Location: [facility];  Service: Orthopedics;  Laterality: Left;  . Abdominal aortagram N/A 05/06/2014    Procedure: ABDOMINAL Maxcine Ham;  Surgeon: Serafina Mitchell, MD;  Location: [facility];  Service: Cardiovascular;  Laterality: N/A;   Family History  Problem Relation Age of Onset  . Heart attack Mother   . Heart disease Mother   . Heart attack Father   . Diabetes Son     amputation-BKA    Social History  Substance Use Topics  . Smoking status: Former Smoker -- 0.30 packs/day for 60 years    Quit date: 10/02/2012  . Smokeless tobacco: Never Used  . Alcohol Use: 0.0 oz/week    0 Standard drinks or equivalent per week     Comment: occasioally    Review of Systems  Constitutional: Negative for fever.  Respiratory: Negative for cough and shortness of breath.   Cardiovascular: Positive for chest pain. Negative for palpitations, leg swelling  and syncope.  Gastrointestinal: Positive for abdominal pain. Negative for nausea, vomiting, diarrhea, constipation and abdominal distention.  Genitourinary: Negative for dysuria and frequency.  Musculoskeletal: Negative for back pain.  Skin: Negative for rash and wound.  Neurological: Negative for dizziness.  All other systems reviewed and are negative.     Allergies  Aspirin  Home Medications   Prior to Admission medications   Medication Sig Start Date End Date Taking? Authorizing Provider  cholecalciferol (VITAMIN D) 1000 UNITS tablet Take 2,000 Units by mouth daily.   Yes Historical Provider, MD  levothyroxine  (SYNTHROID, LEVOTHROID) 88 MCG tablet Take by mouth. 01/08/15  Yes Historical Provider, MD  tamsulosin (FLOMAX) 0.4 MG CAPS capsule Take 1 capsule (0.4 mg total) by mouth daily after supper. 01/08/15  Yes Denita Lung, MD  Vitamin D, Ergocalciferol, (DRISDOL) 50000 UNITS CAPS capsule Take 50,000 Units by mouth every 7 (seven) days. On Sunday   Yes Historical Provider, MD  ciprofloxacin (CIPRO) 250 MG tablet Take 1 tablet by mouth daily. For 7 days 08/14/15   Historical Provider, MD  Vitamin D, Ergocalciferol, (DRISDOL) 50000 UNITS CAPS capsule TAKE 1 CAPSULE EVERY 7 DAYS Patient not taking: Reported on 08/18/2015 06/17/15   Denita Lung, MD   BP 190/74 mmHg  Pulse 63  Temp(Src) 98.3 F (36.8 C) (Oral)  Resp 17  SpO2 99% Physical Exam  Constitutional: He is oriented to person, place, and time. He appears well-developed and well-nourished.  HENT:  Head: Normocephalic.  Eyes: Pupils are equal, round, and reactive to light.  Neck: Normal range of motion.  Cardiovascular: Normal rate and regular rhythm.   Pulmonary/Chest: Effort normal and breath sounds normal.  Abdominal: Soft. Bowel sounds are normal. He exhibits no distension. There is no tenderness.  Musculoskeletal: Normal range of motion. He exhibits no edema.  Neurological: He is alert and oriented to person, place, and time.  Skin: Skin is warm. No rash noted.  Nursing note and vitals reviewed.   ED Course  Procedures (including critical care time) Labs Review Labs Reviewed  CBC WITH DIFFERENTIAL/PLATELET - Abnormal; Notable for the following:    Platelets 113 (*)    All other components within normal limits  URINALYSIS, ROUTINE W REFLEX MICROSCOPIC (NOT AT [facility]) - Abnormal; Notable for the following:    Hgb urine dipstick SMALL (*)    Protein, ur 100 (*)    All other components within normal limits  I-STAT CHEM 8, ED - Abnormal; Notable for the following:    Sodium 146 (*)    BUN 33 (*)    Creatinine, Ser 2.10 (*)     Calcium, Ion 1.03 (*)    All other components within normal limits  URINE MICROSCOPIC-ADD ON  I-STAT TROPOININ, ED    Imaging Review Ct Abdomen Pelvis Wo Contrast  08/18/2015   CLINICAL DATA:  Left-sided chest pain radiating to the back. History of abdominal aortic aneurysm repair.  EXAM: CT ABDOMEN AND PELVIS WITHOUT CONTRAST  TECHNIQUE: Multidetector CT imaging of the abdomen and pelvis was performed following the standard protocol without IV contrast.  COMPARISON:  09/26/2014  FINDINGS: Lower chest and abdominal wall: Coronary atherosclerosis, mitral annular ossification, and mild aortic valve calcifications/sclerosis. Mild subpleural reticulation at the bases.  Hepatobiliary: Central hepatic cyst measuring 21 mm.No evidence of biliary obstruction or stone.  Pancreas: Marked atrophy of the body and tail without associated ductal enlargement. There is a chronic cystic mass within the pancreatic head with visible communication to the gastric antrum.  Given stability since 2011 this is considered benign and in this patient with remote history of pancreatitis this is favored to reflect sequela of cysto gastrostomy. No active inflammatory changes.  Spleen: Unremarkable.  Adrenals/Urinary Tract: Negative adrenals. Stable pattern of bilateral renal cortical cysts with benign thin calcification of a 52 mm interpolar left renal cyst. Sub cm right renal high-density lesions most consistent with hemorrhagic cysts. No hydronephrosis or stone. Chronic diffuse bladder wall thickening, chronic outlet obstruction given below.  Reproductive:TURP defect noted.  No acute finding.  Stomach/Bowel: Moderate stool volume without obstruction. No appendicitis.  Vascular/Lymphatic: Status post open abdominal aortic aneurysm repair with stable appearance of the graft. Surrounding indistinct fat is also stable with no gas or fluid collection. Atherosclerosis.  Peritoneal: No ascites or pneumoperitoneum.  Musculoskeletal: No acute  abnormalities.  Remote L2 superior endplate deformity. Lower lumbar facet arthropathy.  IMPRESSION: No explanation for acute abdominal pain. Chronic findings are stable from 2015 study and described above.   Electronically Signed   By: Monte Fantasia M.D.   On: 08/18/2015 04:05   I have personally reviewed and evaluated these images and lab results as part of my medical decision-making.   EKG Interpretation   Date/Time:  Tuesday August 18 2015 02:00:00 EDT Ventricular Rate:  81 PR Interval:  225 QRS Duration: 86 QT Interval:  413 QTC Calculation: 479 R Axis:   -61 Text Interpretation:  Sinus rhythm with 1st degree A-V block Left anterior  fascicular block Probable anteroseptal infarct, old Confirmed by Glynn Octave [phone]) on 08/18/2015 2:45:24 AM     Patient is pain free at this time  Now reporting that is was like his previous muscle spasm type pain that he has been getting for the past 6-8 months MDM   Final diagnoses:  Left lower quadrant pain  Renal insufficiency         Junius Creamer, NP 08/18/15 [id number]  Everlene Balls, MD 08/18/15 [phone]

## 2015-08-18 NOTE — Discharge Instructions (Signed)
Abdominal Pain Many things can cause belly (abdominal) pain. Most times, the belly pain is not dangerous. Many cases of belly pain can be watched and treated at home. HOME CARE   Do not take medicines that help you go poop (laxatives) unless told to by your doctor.  Only take medicine as told by your doctor.  Eat or drink as told by your doctor. Your doctor will tell you if you should be on a special diet. GET HELP IF:  You do not know what is causing your belly pain.  You have belly pain while you are sick to your stomach (nauseous) or have runny poop (diarrhea).  You have pain while you pee or poop.  Your belly pain wakes you up at night.  You have belly pain that gets worse or better when you eat.  You have belly pain that gets worse when you eat fatty foods.  You have a fever. GET HELP RIGHT AWAY IF:   The pain does not go away within 2 hours.  You keep throwing up (vomiting).  The pain changes and is only in the right or left part of the belly.  You have bloody or tarry looking poop. MAKE SURE YOU:   Understand these instructions.  Will watch your condition.  Will get help right away if you are not doing well or get worse. Document Released: 04/25/2008 Document Revised: 11/12/2013 Document Reviewed: 07/17/2013 [facility] Patient Information 2015 [place], [place]. This information is not intended to replace advice given to you by your health care provider. Make sure you discuss any questions you have with your health care provider. Your CT Scan does not show any problems with your abdominal aneurysm repair it does show that you have a moderate amount of stool in the colon and do not need any medication to stop stooling but you do need to increase the bulk of your stool to allow it ot pass I suggest Benafiber or similar product plus increased water and exercise

## 2015-08-18 NOTE — ED Notes (Signed)
Pt complains of left sided chest pain that radiates to his back, he had a triple AAA repair in 2011 and the sx are similar, pt's blood pressure is 219-95 tonight

## 2015-08-18 NOTE — Plan of Care (Signed)
Pt requesting anti-diarrheal after discharged. Prescribed OTC imodium.

## 2015-08-18 NOTE — ED Notes (Signed)
Prescription for Imodium 2mg  called to [facility] per pt's son Craig's request.  Imodium 2mg  Q3hours PRN until sxs stops.  QTY 12, no refill.  Cecilie Lowers [phone] contacted and notified.  Prescribed by Laneta Simmers EDP

## 2015-08-21 ENCOUNTER — Encounter: Payer: Self-pay | Admitting: Podiatry

## 2015-08-21 ENCOUNTER — Ambulatory Visit (INDEPENDENT_AMBULATORY_CARE_PROVIDER_SITE_OTHER): Payer: Medicare HMO | Admitting: Podiatry

## 2015-08-21 DIAGNOSIS — B351 Tinea unguium: Secondary | ICD-10-CM | POA: Diagnosis not present

## 2015-08-21 DIAGNOSIS — M79606 Pain in leg, unspecified: Secondary | ICD-10-CM

## 2015-08-21 NOTE — Patient Instructions (Signed)
Seen for hypertrophic nails and calluses. All nails and calluses debrided. Return in 3 months or as needed.  

## 2015-08-21 NOTE — Progress Notes (Signed)
SUBJECTIVE: 81 y.o. year old male presents requesting toe nails and calluses trimmed. Right foot hurts on the ball of the foot. Stated that his blood sugar is under control. He is accompanied by his wife.   OBJECTIVE: DERMATOLOGIC EXAMINATION: Nails: Thick dystrophic nails x 10. Calluses: Thick painful callus under 3rd MPJ area right foot.  VASCULAR EXAMINATION OF LOWER LIMBS: Pedal pulses: are faintly palpable on both feet.  Capillary Filling times within 3 seconds in all digits.  No edema or ischemic changes noted. Temperature gradient from tibial crest to dorsum of foot is within normal bilateral. NEUROLOGIC EXAMINATION OF THE LOWER LIMBS: All epicritic and tactile sensations grossly intact.[name] EXAMINATION: Absence of 2nd toe right, severe hammer toe deformity 3rd digit right, mild lesser digital contracture bilateral.   ASSESSMENT: Painful callus sub 3 right. Mycotic nails x 10. Peripheral neuropathy bilateral. Painful feet.  PLAN: Reviewed findings and available treatment options. All nails and calluses debrided

## 2015-08-26 NOTE — Progress Notes (Addendum)
1315 ED CM received a call from [facility] ED unit secretary when pt's son called wanting rx Imodium 2mg  Q3 hours PRN until sxs stops. QTY 12, no refill from an [place]  Unit secretary states Cecilie Lowers was rude to her and requested to speak who someone else other than her.  CM spoke with Cecilie Lowers, reviewed EPIC as speaking with Cecilie Lowers.  CM attempted to ask craig some questions for clarity but he told CM she was rude for interrupting him.  Cm listened to [facility] ventilate his feelings about needing a Rx to send to [facility] mail order process.  When CM began to inform Cecilie Lowers that the recommendation would be to get the pcp to provide the Rx, Cecilie Lowers interrupted CM and informed her the EDP needs to do it in a loud aggressive tone.  Cm requested Cecilie Lowers lower his voice and he informed CM "you keep interrupting me"  CM waited for him to finish speaking.  CM began to explain why the EDP would not provider a Rx for mail order for prn Imodium and would prefer the Rx come from the pcp if needed but Cecilie Lowers interrupted her and stated pt did not have a family doctor. Dr Jill Alexanders was noted to be listed as the pcp.  CM informed Cecilie Lowers that Dr Redmond School is listed as the pcp.  He informed CM that was not pt's dr.  Jearld Lesch asked the name of the family doctor but Cecilie Lowers told CM she interrupted him again.  Cecilie Lowers asked for CM last name and states he was going to complain "about this".  CM allowed Cecilie Lowers to finish ventilating his feelings CM informed Cecilie Lowers that the recommendation would still be to see if the pcp can offer the Rx needed.  CM asked for the pcp name.  Cecilie Lowers stated pcp is [facility].  CM began to explain that [place] [place] [facility] can be called to obtain the Rx for the [employer] order program but during this time Cecilie Lowers continued to ask CM her last name.  CM asked if she could call [facility] medical.  " I don't care" The call was discontinued  1322 ED CM called and spoke with [facility] who state pt seen by Hubbard Robinson PA and she would send a message for Ryan to provide a Rx for pt and " I don't think it would be a problem"  Brayton Layman states "he just needs to call back to the office to get it when it is ready" CM thanked [facility]   1326 ED Cm called back to [place] at [phone] to inform Cecilie Lowers that Brayton Layman is sending a message requesting Hubbard Robinson PA provide the Rx needed to send to [facility] mail order Cecilie Lowers asked again for [facility] name Cm told him again [place] The call was concluded with Cecilie Lowers stating "Um hum"  [place] tech sitting bedside CM during the complete call with Cecilie Lowers stated that pt can also get Imodium OTC  1405 Message sent to [employer] via [facility]

## 2015-08-28 ENCOUNTER — Ambulatory Visit: Payer: Self-pay | Admitting: Podiatry

## 2015-11-19 ENCOUNTER — Ambulatory Visit: Payer: Self-pay | Admitting: Podiatry

## 2015-11-20 ENCOUNTER — Ambulatory Visit: Payer: Self-pay | Admitting: Podiatry

## 2015-11-25 ENCOUNTER — Encounter: Payer: Self-pay | Admitting: Podiatry

## 2015-11-25 ENCOUNTER — Ambulatory Visit (INDEPENDENT_AMBULATORY_CARE_PROVIDER_SITE_OTHER): Payer: Medicare HMO | Admitting: Podiatry

## 2015-11-25 ENCOUNTER — Other Ambulatory Visit: Payer: Self-pay | Admitting: Family Medicine

## 2015-11-25 VITALS — BP 168/69 | HR 79

## 2015-11-25 DIAGNOSIS — M79604 Pain in right leg: Secondary | ICD-10-CM

## 2015-11-25 DIAGNOSIS — B351 Tinea unguium: Secondary | ICD-10-CM | POA: Diagnosis not present

## 2015-11-25 NOTE — Patient Instructions (Signed)
Seen for hypertrophic nails. All nails debrided. Return in 3 months or as needed.  

## 2015-11-25 NOTE — Progress Notes (Signed)
SUBJECTIVE: 82 y.o. year old male presents requesting toe nails trimmed. Right foot hurts on the contracted 3rd toe with missing 2nd toe.   OBJECTIVE: DERMATOLOGIC EXAMINATION: Nails: Thick dystrophic nails x 9. VASCULAR EXAMINATION OF LOWER LIMBS: Pedal pulses: are faintly palpable on both feet.  Capillary Filling times within 3 seconds in all digits.  No edema or ischemic changes noted. Temperature gradient from tibial crest to dorsum of foot is within normal bilateral. NEUROLOGIC EXAMINATION OF THE LOWER LIMBS: All epicritic and tactile sensations grossly intact.Fatima Blank EXAMINATION: Absence of 2nd toe right, severe hammer toe deformity 3rd digit right, mild lesser digital contracture bilateral.   ASSESSMENT: Mycotic nails x 9. Peripheral neuropathy bilateral. Painful feet.  PLAN: Reviewed findings and available treatment options. All nails and calluses debrided

## 2015-11-26 ENCOUNTER — Ambulatory Visit: Payer: Self-pay | Admitting: Podiatry

## 2016-01-13 ENCOUNTER — Other Ambulatory Visit: Payer: Self-pay | Admitting: Family Medicine

## 2016-02-23 ENCOUNTER — Ambulatory Visit (INDEPENDENT_AMBULATORY_CARE_PROVIDER_SITE_OTHER): Payer: Medicare HMO | Admitting: Podiatry

## 2016-02-23 ENCOUNTER — Encounter: Payer: Self-pay | Admitting: Podiatry

## 2016-02-23 VITALS — BP 160/73 | HR 64

## 2016-02-23 DIAGNOSIS — B351 Tinea unguium: Secondary | ICD-10-CM | POA: Diagnosis not present

## 2016-02-23 DIAGNOSIS — M79606 Pain in leg, unspecified: Secondary | ICD-10-CM | POA: Diagnosis not present

## 2016-02-23 NOTE — Patient Instructions (Signed)
Seen for hypertrophic nails. All nails debrided. Return in 3 months or as needed.  

## 2016-02-23 NOTE — Progress Notes (Signed)
SUBJECTIVE: 82 y.o. year old male presents requesting toe nails trimmed.   OBJECTIVE: DERMATOLOGIC EXAMINATION: Nails: Thick dystrophic nails x 9. VASCULAR EXAMINATION OF LOWER LIMBS: Pedal pulses: are faintly palpable on both feet.  Capillary Filling times within 3 seconds in all digits.  No edema or ischemic changes noted. Temperature gradient from tibial crest to dorsum of foot is within normal bilateral. NEUROLOGIC EXAMINATION OF THE LOWER LIMBS: All epicritic and tactile sensations grossly intact.Fatima Blank EXAMINATION: Absence of 2nd toe right, severe hammer toe deformity 3rd digit right, mild lesser digital contracture bilateral.   ASSESSMENT: Mycotic nails x 9. Peripheral neuropathy bilateral. Painful feet.  PLAN: Reviewed findings and available treatment options. All nails and calluses debrided

## 2016-04-13 ENCOUNTER — Other Ambulatory Visit: Payer: Self-pay | Admitting: Family Medicine

## 2016-04-25 ENCOUNTER — Other Ambulatory Visit: Payer: Self-pay | Admitting: Family Medicine

## 2016-05-25 ENCOUNTER — Ambulatory Visit (INDEPENDENT_AMBULATORY_CARE_PROVIDER_SITE_OTHER): Payer: Medicare HMO | Admitting: Podiatry

## 2016-05-25 ENCOUNTER — Encounter: Payer: Self-pay | Admitting: Podiatry

## 2016-05-25 DIAGNOSIS — M79604 Pain in right leg: Secondary | ICD-10-CM

## 2016-05-25 DIAGNOSIS — B351 Tinea unguium: Secondary | ICD-10-CM | POA: Diagnosis not present

## 2016-05-25 DIAGNOSIS — M79606 Pain in leg, unspecified: Secondary | ICD-10-CM | POA: Diagnosis not present

## 2016-05-25 NOTE — Patient Instructions (Signed)
Seen for hypertrophic nails. Noted of bruised skin 4th toe from old 5th toe nail.  5th toe nail came off with clean dry nail bed. All nails debrided. Cover the bruised skin with antibiotic and band aid during the day. May leave it open at night. Return in 2-3 months or as needed.

## 2016-05-25 NOTE — Progress Notes (Signed)
SUBJECTIVE: 82 y.o. year old male presents requesting toe nails trimmed.  Had a ripped toe nail on 5th left and had to go to Urgent care and was taken care of.   OBJECTIVE: DERMATOLOGIC EXAMINATION: Nails: Thick dystrophic nails x 9. Loss of 5th toe nail from recent injury. Irritated skin with mild inflammation at lateral aspect 4th digit left. No drainage and no ascending cellulitis noted. VASCULAR EXAMINATION OF LOWER LIMBS: Pedal pulses: are faintly palpable on both feet.  Capillary Filling times within 3 seconds in all digits.  No edema or ischemic changes noted. Temperature gradient from tibial crest to dorsum of foot is within normal bilateral. NEUROLOGIC EXAMINATION OF THE LOWER LIMBS: All epicritic and tactile sensations grossly intact.Fatima Blank EXAMINATION: Absence of 2nd toe right, severe hammer toe deformity 3rd digit right, mild lesser digital contracture bilateral.   ASSESSMENT: Mycotic nails x 9. Loss of 5th toe nail.  Skin abrasion 4th toe left from 5th toe nail.  Peripheral neuropathy bilateral. Painful feet.  PLAN: Reviewed findings and available treatment options. All nails and calluses debrided. Advised to keep the 4th toe covered with antibiotic ointment till is healed.

## 2016-07-26 ENCOUNTER — Ambulatory Visit: Payer: Medicare HMO | Admitting: Podiatry

## 2016-09-30 ENCOUNTER — Emergency Department (HOSPITAL_COMMUNITY): Payer: Medicare HMO

## 2016-09-30 ENCOUNTER — Emergency Department (HOSPITAL_COMMUNITY)
Admission: EM | Admit: 2016-09-30 | Discharge: 2016-09-30 | Disposition: A | Discharge status: Home / Self Care | Payer: Medicare HMO | Attending: Emergency Medicine | Admitting: Emergency Medicine

## 2016-09-30 ENCOUNTER — Encounter (HOSPITAL_COMMUNITY): Payer: Self-pay | Admitting: Emergency Medicine

## 2016-09-30 DIAGNOSIS — E039 Hypothyroidism, unspecified: Secondary | ICD-10-CM | POA: Insufficient documentation

## 2016-09-30 DIAGNOSIS — S34102D Unspecified injury to L2 level of lumbar spinal cord, subsequent encounter: Secondary | ICD-10-CM | POA: Diagnosis present

## 2016-09-30 DIAGNOSIS — M545 Low back pain, unspecified: Secondary | ICD-10-CM

## 2016-09-30 DIAGNOSIS — Z87891 Personal history of nicotine dependence: Secondary | ICD-10-CM | POA: Diagnosis not present

## 2016-09-30 DIAGNOSIS — S32020D Wedge compression fracture of second lumbar vertebra, subsequent encounter for fracture with routine healing: Secondary | ICD-10-CM

## 2016-09-30 DIAGNOSIS — N183 Chronic kidney disease, stage 3 (moderate): Secondary | ICD-10-CM | POA: Diagnosis not present

## 2016-09-30 DIAGNOSIS — S32029D Unspecified fracture of second lumbar vertebra, subsequent encounter for fracture with routine healing: Secondary | ICD-10-CM | POA: Insufficient documentation

## 2016-09-30 DIAGNOSIS — W109XXD Fall (on) (from) unspecified stairs and steps, subsequent encounter: Secondary | ICD-10-CM | POA: Diagnosis not present

## 2016-09-30 DIAGNOSIS — I129 Hypertensive chronic kidney disease with stage 1 through stage 4 chronic kidney disease, or unspecified chronic kidney disease: Secondary | ICD-10-CM | POA: Diagnosis not present

## 2016-09-30 DIAGNOSIS — M549 Dorsalgia, unspecified: Secondary | ICD-10-CM

## 2016-09-30 DIAGNOSIS — Z79899 Other long term (current) drug therapy: Secondary | ICD-10-CM | POA: Diagnosis not present

## 2016-09-30 DIAGNOSIS — M62838 Other muscle spasm: Secondary | ICD-10-CM

## 2016-09-30 LAB — COMPREHENSIVE METABOLIC PANEL
ALT: 14 U/L — AB (ref 17–63)
AST: 14 U/L — AB (ref 15–41)
Albumin: 3.5 g/dL (ref 3.5–5.0)
Alkaline Phosphatase: 90 U/L (ref 38–126)
Anion gap: 9 (ref 5–15)
BUN: 51 mg/dL — ABNORMAL HIGH (ref 6–20)
CHLORIDE: 115 mmol/L — AB (ref 101–111)
CO2: 15 mmol/L — AB (ref 22–32)
CREATININE: 2.44 mg/dL — AB (ref 0.61–1.24)
Calcium: 8 mg/dL — ABNORMAL LOW (ref 8.9–10.3)
GFR calc non Af Amer: 23 mL/min — ABNORMAL LOW (ref >60)
GFR, EST AFRICAN AMERICAN: 27 mL/min — AB (ref >60)
Glucose, Bld: 121 mg/dL — ABNORMAL HIGH (ref 65–99)
POTASSIUM: 4 mmol/L (ref 3.5–5.1)
SODIUM: 139 mmol/L (ref 135–145)
Total Bilirubin: 0.6 mg/dL (ref 0.3–1.2)
Total Protein: 6.4 g/dL — ABNORMAL LOW (ref 6.5–8.1)

## 2016-09-30 LAB — CBC WITH DIFFERENTIAL/PLATELET
BASOS ABS: 0 10*3/uL (ref 0.0–0.1)
Basophils Relative: 0 %
EOS ABS: 0.1 10*3/uL (ref 0.0–0.7)
EOS PCT: 1 %
HCT: 34.2 % — ABNORMAL LOW (ref 39.0–52.0)
Hemoglobin: 11.9 g/dL — ABNORMAL LOW (ref 13.0–17.0)
Lymphocytes Relative: 15 %
Lymphs Abs: 1 10*3/uL (ref 0.7–4.0)
MCH: 30 pg (ref 26.0–34.0)
MCHC: 34.8 g/dL (ref 30.0–36.0)
MCV: 86.1 fL (ref 78.0–100.0)
MONO ABS: 0.7 10*3/uL (ref 0.1–1.0)
Monocytes Relative: 10 %
Neutro Abs: 4.8 10*3/uL (ref 1.7–7.7)
Neutrophils Relative %: 73 %
PLATELETS: 108 10*3/uL — AB (ref 150–400)
RBC: 3.97 MIL/uL — AB (ref 4.22–5.81)
RDW: 16.8 % — AB (ref 11.5–15.5)
WBC: 6.5 10*3/uL (ref 4.0–10.5)

## 2016-09-30 LAB — LIPASE, BLOOD: LIPASE: 11 U/L (ref 11–51)

## 2016-09-30 MED ORDER — METHOCARBAMOL 500 MG PO TABS
1000.0000 mg | ORAL_TABLET | Freq: Once | ORAL | Status: AC
  Administered 2016-09-30: 1000 mg via ORAL
  Filled 2016-09-30: qty 2

## 2016-09-30 MED ORDER — METHOCARBAMOL 500 MG PO TABS: 500.0000 mg | ORAL_TABLET | Freq: Three times a day (TID) | ORAL | 0 refills | Status: DC | PRN

## 2016-09-30 NOTE — ED Notes (Signed)
Pt was reminded of need for urine sample. Pt was angry and stated that no one told him a urine sample was needed. Pt refused to give a urine sample and stated he would provide a urine sample "if he get around to it". Pt refusing an in and out catheter.

## 2016-09-30 NOTE — ED Notes (Signed)
Pt asked again for urine sample. Pt's son at bedside asked if pt could refuse. This RN told family that they could refuse if they would like, but explained the rational behind the EDP wanting a urinalysis. Pt refusing urine sample at this time

## 2016-09-30 NOTE — ED Provider Notes (Signed)
[facility] DEPT Provider Note   CSN: [id number] Arrival date & time: 09/30/16  7026     History   Chief Complaint Chief Complaint  Patient presents with  . Fall  . Back Pain    HPI Christian Crisanto. is a 82 y.o. male. He presents evaluation of back pain after a fall. He fell after coming over the bottom stairs on Monday night, 4 days ago. He landed flat on his back. Not on his buttock (sitting position. This is a limited abdominal discomfort to the week as well wondered if the tube may be related. Did not strike his head. No neck pain. No extremity pain.  Pain radiating to the arms or legs. No bowel or bladder changes. No fevers chills nausea vomiting difficult breathing or cough.    HPI  Past Medical History:  Diagnosis Date  . AAA (abdominal aortic aneurysm) ([place])   . Arthritis   . BPH (benign prostatic hyperplasia)   . Complication of anesthesia    "a tough time"  -pt unable to explain  . Glucose intolerance (impaired glucose tolerance)   . Hypertension   . Hypothyroid   . Pancreatitis 15-20 yrs ago  . Peripheral neuropathy (HCC)    fingers and toes  . Renal insufficiency     Patient Active Problem List   Diagnosis Date Noted  . Onychomycosis 02/27/2015  . Pain in lower limb 02/27/2015  . Vitamin D deficiency 11/06/2014  . Hypocalcemia 09/26/2014  . Peripheral neuropathy ([place]) 09/26/2014  . Thrombocytopenia ([place]) 09/26/2014  . Macular degeneration 09/25/2014  . 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 S/P left hip revision 02/10/2014  . Abdominal aortic aneurysm ([place]) 09/30/2013  . Anemia of chronic disease 08/07/2013  . Borderline diabetes mellitus 01/02/2013  . CKD (chronic kidney disease), stage III 01/02/2013  . Hypothyroid 10/17/2011  . Smokers' cough ([place]) 10/17/2011  . 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 BPH (benign prostatic hyperplasia) 10/17/2011    Past Surgical History:  Procedure  Laterality Date  . ABDOMINAL AORTAGRAM N/A 05/06/2014   Procedure: ABDOMINAL Maxcine Ham;  Surgeon: Serafina Mitchell, MD;  Location: [facility] CATH LAB;  Service: Cardiovascular;  Laterality: N/A;  . ABDOMINAL AORTIC ANEURYSM REPAIR  july 2011  . AMPUTATION Right 01/03/2013   Procedure: AMPUTATION DIGIT;  Surgeon: Wylene Simmer, MD;  Location: [facility];  Service: Orthopedics;  Laterality: Right;  RIGHT 2ND TOE AMPUTATION  . HIP PINNING,CANNULATED Left 08/08/2013   Procedure: LEFT CANNULATED HIP PINNING;  Surgeon: Rozanna Box, MD;  Location: [facility];  Service: Orthopedics;  Laterality: Left;  . PROSTATE SURGERY  5 yrs ago   urethral dilation  . STERIOD INJECTION Bilateral 08/08/2013   Procedure: STEROID INJECTION;  Surgeon: Rozanna Box, MD;  Location: [facility];  Service: Orthopedics;  Laterality: Bilateral;  . TOTAL HIP REVISION Left 02/10/2014   Procedure: REMOVAL OF SYNTHESE SCREWS LEFT HIP AND CONVERSION LEFT TOTAL HIP ARTHROPLASTY;  Surgeon: Mauri Pole, MD;  Location: [facility];  Service: Orthopedics;  Laterality: Left;       Home Medications    Prior to Admission medications   Medication Sig Start Date End Date Taking? Authorizing Provider  amLODipine (NORVASC) 5 MG tablet Take 5 mg by mouth daily.  03/04/16  Yes Historical Provider, MD  calcium-vitamin D 250-100 MG-UNIT tablet Take 1 tablet by mouth 2 (two) times daily.   Yes Historical Provider, MD  levothyroxine (SYNTHROID, LEVOTHROID) 88 MCG tablet TAKE 1 TABLET EVERY  MORNING 01/13/16  Yes Denita Lung, MD  tamsulosin (FLOMAX) 0.4 MG CAPS capsule Take 1 capsule (0.4 mg total) by mouth daily after supper. 01/08/15  Yes Denita Lung, MD  Vitamin D, Ergocalciferol, (DRISDOL) 50000 UNITS CAPS capsule TAKE 1 CAPSULE EVERY 7 DAYS 06/17/15  Yes Denita Lung, MD  cholecalciferol (VITAMIN D) 1000 UNITS tablet Take 2,000 Units by mouth daily.    Historical Provider, MD  methocarbamol (ROBAXIN) 500 MG tablet Take 1 tablet (500 mg total) by mouth 3  (three) times daily between meals as needed. 09/30/16   Tanna Furry, MD  Vitamin D, Ergocalciferol, (DRISDOL) 50000 UNITS CAPS capsule Take 50,000 Units by mouth every 7 (seven) days. On Sunday    Historical Provider, MD    Family History Family History  Problem Relation Age of Onset  . Heart attack Mother   . Heart disease Mother   . Heart attack Father   . Diabetes Son     amputation-BKA     Social History Social History  Substance Use Topics  . Smoking status: Former Smoker    Packs/day: 0.30    Years: 60.00    Quit date: 10/02/2012  . Smokeless tobacco: Never Used  . Alcohol use 0.0 oz/week     Comment: occasioally     Allergies   Aspirin and Shellfish allergy   Review of Systems Review of Systems  Constitutional: Negative for appetite change, chills, diaphoresis, fatigue and fever.  HENT: Negative for mouth sores, sore throat and trouble swallowing.   Eyes: Negative for visual disturbance.  Respiratory: Negative for cough, chest tightness, shortness of breath and wheezing.   Cardiovascular: Negative for chest pain.  Gastrointestinal: Positive for abdominal pain. Negative for abdominal distention, diarrhea, nausea and vomiting.  Endocrine: Negative for polydipsia, polyphagia and polyuria.  Genitourinary: Negative for dysuria, frequency and hematuria.  Musculoskeletal: Positive for back pain. Negative for gait problem.  Skin: Negative for color change, pallor and rash.  Neurological: Negative for dizziness, syncope, light-headedness and headaches.  Hematological: Does not bruise/bleed easily.  Psychiatric/Behavioral: Negative for behavioral problems and confusion.     Physical Exam Updated Vital Signs BP 146/58   Pulse 62   Temp 97.6 F (36.4 C) (Oral)   Resp 16   SpO2 100%   Physical Exam  Constitutional: He is oriented to person, place, and time. He appears well-developed and well-nourished. No distress.  HENT:  Head: Normocephalic.  Eyes:  Conjunctivae are normal. Pupils are equal, round, and reactive to light. No scleral icterus.  Neck: Normal range of motion. Neck supple. No thyromegaly present.  Cardiovascular: Normal rate and regular rhythm.  Exam reveals no gallop and no friction rub.   No murmur heard. Pulmonary/Chest: Effort normal and breath sounds normal. No respiratory distress. He has no wheezes. He has no rales.  Abdominal: Soft. Bowel sounds are normal. He exhibits no distension. There is no tenderness. There is no rebound.    He describes pain radiating into his flanks. It seems to radiate from his mid back. No abdominal tenderness.  Musculoskeletal: Normal range of motion.       Back:  Normal strength and sensation extremities. Does have some tenderness in the bilateral flank.  Neurological: He is alert and oriented to person, place, and time.  Skin: Skin is warm and dry. No rash noted.  Psychiatric: He has a normal mood and affect. His behavior is normal.     ED Treatments / Results  Labs (all labs ordered are  listed, but only abnormal results are displayed) Labs Reviewed  CBC WITH DIFFERENTIAL/PLATELET - Abnormal; Notable for the following:       Result Value   RBC 3.97 (*)    Hemoglobin 11.9 (*)    HCT 34.2 (*)    RDW 16.8 (*)    Platelets 108 (*)    All other components within normal limits  COMPREHENSIVE METABOLIC PANEL - Abnormal; Notable for the following:    Chloride 115 (*)    CO2 15 (*)    Glucose, Bld 121 (*)    BUN 51 (*)    Creatinine, Ser 2.44 (*)    Calcium 8.0 (*)    Total Protein 6.4 (*)    AST 14 (*)    ALT 14 (*)    GFR calc non Af Amer 23 (*)    GFR calc Af Amer 27 (*)    All other components within normal limits  LIPASE, BLOOD    EKG  EKG Interpretation None       Radiology Dg Thoracic Spine W/swimmers  Result Date: 09/30/2016 CLINICAL DATA:  Patient had a fall 4 days ago with some discomfort in the mid abdomen radiating to the back. Very painful lying on the  table for radiographs. EXAM: THORACIC SPINE 2 VIEWS COMPARISON:  Chest x-ray dated September 26, 2014 FINDINGS: The bones are diffusely osteopenic. No thoracic compression fracture is demonstrated. The disc space heights are reasonably well-maintained. The pedicles are intact. There are no abnormal paravertebral soft tissue densities. There is calcification in the wall of the aortic arch. IMPRESSION: No acute thoracic spine compression fracture or other acute thoracic spine abnormality observed. There is partial compression of the body of L2 which is reported more completely on the accompanying lumbar spine series. Thoracic aortic atherosclerosis. Electronically Signed   By: David  Martinique M.D.   On: 09/30/2016 12:36   Dg Lumbar Spine Complete  Result Date: 09/30/2016 CLINICAL DATA:  Status post fall 4 days ago with persistent abdominal and back pain. Obtained the x-rays was painful today. EXAM: LUMBAR SPINE - COMPLETE 4+ VIEW COMPARISON:  Lumbar spine series dated December 03, 2013 FINDINGS: The bones are diffusely osteopenic. There is further loss of height of the body of L2 centered on the superior endplate. The loss of height anteriorly is 20%. No significant posterior height loss is observed. The other lumbar vertebral bodies are preserved in height. The disc space heights are reasonably well-maintained. There is no spondylolisthesis. There is facet joint hypertrophy at L4-5 and at L5-S1. The pedicles and transverse processes are intact where visualized. IMPRESSION: Interval worsening of the L2 compression fracture with loss of height of 20% anteriorly now. No significant abnormality observed elsewhere within the lumbar spine. Electronically Signed   By: David  Martinique M.D.   On: 09/30/2016 12:38   Dg Pelvis 1-2 Views  Result Date: 09/30/2016 CLINICAL DATA:  Low back pain after a fall 4 days ago. EXAM: PELVIS - 1-2 VIEW COMPARISON:  Portable pelvis radiograph of February 10, 2014 FINDINGS: The bones are  diffusely osteopenic. No lytic or blastic lesion of the pelvis is observed. There is mild narrowing of the right hip joint. There is a prosthetic left hip joint. No acute pelvic fracture is observed. IMPRESSION: There is no acute bony abnormality of the pelvis. Electronically Signed   By: David  Martinique M.D.   On: 09/30/2016 12:39    Procedures Procedures (including critical care time)  Medications Ordered in ED Medications  methocarbamol (ROBAXIN) tablet  1,000 mg (not administered)     Initial Impression / Assessment and Plan / ED Course  I have reviewed the triage vital signs and the nursing notes.  Pertinent labs & imaging results that were available during my care of the patient were reviewed by me and considered in my medical decision making (see chart for details).  Clinical Course     X-ray show increase in the anterior height of L2 versus 2015. No for subacute versus chronic worsening compression fracture without neurological symptoms.  He adamantly declines pain medication. He states that when he moves he gets sudden episodes of pain up and down his back. Given a muscle relaxant here with Robaxin prescription for the same. Cannot have anti-inflammatories because of renal insufficiency. I discussed with him to minimize his upright activity and weight-bearing until symptoms improve  Final Clinical Impressions(s) / ED Diagnoses   Final diagnoses:  Acute midline low back pain without sciatica  Muscle spasm  Closed compression fracture of L2 lumbar vertebra with routine healing, subsequent encounter    New Prescriptions New Prescriptions   METHOCARBAMOL (ROBAXIN) 500 MG TABLET    Take 1 tablet (500 mg total) by mouth 3 (three) times daily between meals as needed.     Tanna Furry, MD 09/30/16 1430

## 2016-09-30 NOTE — ED Triage Notes (Signed)
Pt reports low back pain with movement after tripping on slippers and falling on Monday. No pain at rest today, some discomfort through mid abdomen radiating to back. No head injury or LOC.

## 2016-09-30 NOTE — ED Notes (Signed)
Patient's son, Cecilie Lowers, states that he is going to car for little bit and would like staff to call him when doctor comes and rounds.

## 2016-09-30 NOTE — Discharge Instructions (Signed)
Robaxin as needed for muscle spasm.  Primary care follow-up.  Avoid upright activities and heavy lifting until your symptoms have improved

## 2016-09-30 NOTE — ED Notes (Signed)
Pt cannot use restroom at this time, aware urine specimen is needed.  

## 2017-08-08 ENCOUNTER — Ambulatory Visit (INDEPENDENT_AMBULATORY_CARE_PROVIDER_SITE_OTHER): Payer: Medicare HMO | Admitting: Podiatry

## 2017-08-08 ENCOUNTER — Encounter: Payer: Self-pay | Admitting: Podiatry

## 2017-08-08 DIAGNOSIS — B351 Tinea unguium: Secondary | ICD-10-CM | POA: Diagnosis not present

## 2017-08-08 DIAGNOSIS — M79606 Pain in leg, unspecified: Secondary | ICD-10-CM

## 2017-08-08 NOTE — Progress Notes (Signed)
SUBJECTIVE: 83 y.o. year old male presents using walker complaining of painful toes. 3rd toe right foot is curling hard with nail digging into skin. Left great toe nail is thick and hard, hurts to wear shoes. His last visit to this office was 14 months ago.  OBJECTIVE: DERMATOLOGIC EXAMINATION: Nails: Thick dystrophic nails x 9. Ingrown nail on left great toe and 3rd toe right. VASCULAR EXAMINATION OF LOWER LIMBS: Pedal pulses: are not palpable on both feet, DP and PT. Capillary Filling times within 3 seconds in all digits.  No edema or ischemic changes noted. Temperature gradient from tibial crest to dorsum of foot is within normal bilateral. NEUROLOGIC EXAMINATION OF THE LOWER LIMBS: All epicritic and tactile sensations grossly intact.[name] EXAMINATION: Absence of 2nd toe right, severe hammer toe deformity 3rd digit right, mild lesser digital contracture bilateral.   ASSESSMENT: Mycotic nails x 9. Ingrown nails left great toe and 3rd toe right without optn skin. Skin abrasion 4th toe left from 5th toe nail.  Severe hammer toe with pain 3rd right. Painful feet.  PLAN: Reviewed findings and available treatment options, palliation and possible tenotomy on 3rd right if pain persist. All nails debrided. Return in 4 months.

## 2017-08-08 NOTE — Patient Instructions (Signed)
Seen for hypertrophic nails. All nails debrided. Return in 4 months or as needed.

## 2017-08-18 ENCOUNTER — Emergency Department (HOSPITAL_COMMUNITY): Payer: Medicare HMO

## 2017-08-18 ENCOUNTER — Emergency Department (HOSPITAL_COMMUNITY)
Admission: EM | Admit: 2017-08-18 | Discharge: 2017-08-18 | Disposition: A | Discharge status: Home / Self Care | Payer: Medicare HMO | Attending: Emergency Medicine | Admitting: Emergency Medicine

## 2017-08-18 ENCOUNTER — Encounter (HOSPITAL_COMMUNITY): Payer: Self-pay | Admitting: Emergency Medicine

## 2017-08-18 DIAGNOSIS — I129 Hypertensive chronic kidney disease with stage 1 through stage 4 chronic kidney disease, or unspecified chronic kidney disease: Secondary | ICD-10-CM | POA: Diagnosis not present

## 2017-08-18 DIAGNOSIS — R339 Retention of urine, unspecified: Secondary | ICD-10-CM | POA: Insufficient documentation

## 2017-08-18 DIAGNOSIS — N183 Chronic kidney disease, stage 3 (moderate): Secondary | ICD-10-CM | POA: Insufficient documentation

## 2017-08-18 DIAGNOSIS — Z96642 Presence of left artificial hip joint: Secondary | ICD-10-CM | POA: Insufficient documentation

## 2017-08-18 DIAGNOSIS — R102 Pelvic and perineal pain: Secondary | ICD-10-CM | POA: Diagnosis not present

## 2017-08-18 DIAGNOSIS — Z87891 Personal history of nicotine dependence: Secondary | ICD-10-CM | POA: Insufficient documentation

## 2017-08-18 DIAGNOSIS — E039 Hypothyroidism, unspecified: Secondary | ICD-10-CM | POA: Insufficient documentation

## 2017-08-18 DIAGNOSIS — M25512 Pain in left shoulder: Secondary | ICD-10-CM | POA: Insufficient documentation

## 2017-08-18 DIAGNOSIS — M549 Dorsalgia, unspecified: Secondary | ICD-10-CM | POA: Diagnosis present

## 2017-08-18 DIAGNOSIS — Z79899 Other long term (current) drug therapy: Secondary | ICD-10-CM | POA: Insufficient documentation

## 2017-08-18 LAB — URINALYSIS, ROUTINE W REFLEX MICROSCOPIC
BILIRUBIN URINE: NEGATIVE
GLUCOSE, UA: NEGATIVE mg/dL
Ketones, ur: NEGATIVE mg/dL
LEUKOCYTES UA: NEGATIVE
NITRITE: NEGATIVE
Protein, ur: 100 mg/dL — AB
SPECIFIC GRAVITY, URINE: 1.009 (ref 1.005–1.030)
Squamous Epithelial / LPF: NONE SEEN
pH: 7 (ref 5.0–8.0)

## 2017-08-18 LAB — CBC
HCT: 35.3 % — ABNORMAL LOW (ref 39.0–52.0)
HEMOGLOBIN: 11.8 g/dL — AB (ref 13.0–17.0)
MCH: 29.8 pg (ref 26.0–34.0)
MCHC: 33.4 g/dL (ref 30.0–36.0)
MCV: 89.1 fL (ref 78.0–100.0)
Platelets: 116 10*3/uL — ABNORMAL LOW (ref 150–400)
RBC: 3.96 MIL/uL — ABNORMAL LOW (ref 4.22–5.81)
RDW: 15.6 % — AB (ref 11.5–15.5)
WBC: 6.8 10*3/uL (ref 4.0–10.5)

## 2017-08-18 LAB — POCT I-STAT TROPONIN I: Troponin i, poc: 0.01 ng/mL (ref 0.00–0.08)

## 2017-08-18 LAB — COMPREHENSIVE METABOLIC PANEL
ALBUMIN: 3.7 g/dL (ref 3.5–5.0)
ALT: 21 U/L (ref 17–63)
ANION GAP: 10 (ref 5–15)
AST: 21 U/L (ref 15–41)
Alkaline Phosphatase: 105 U/L (ref 38–126)
BILIRUBIN TOTAL: 0.6 mg/dL (ref 0.3–1.2)
BUN: 34 mg/dL — ABNORMAL HIGH (ref 6–20)
CALCIUM: 7.5 mg/dL — AB (ref 8.9–10.3)
CO2: 27 mmol/L (ref 22–32)
Chloride: 104 mmol/L (ref 101–111)
Creatinine, Ser: 1.82 mg/dL — ABNORMAL HIGH (ref 0.61–1.24)
GFR calc Af Amer: 38 mL/min — ABNORMAL LOW (ref >60)
GFR, EST NON AFRICAN AMERICAN: 32 mL/min — AB (ref >60)
GLUCOSE: 147 mg/dL — AB (ref 65–99)
POTASSIUM: 4.2 mmol/L (ref 3.5–5.1)
SODIUM: 141 mmol/L (ref 135–145)
TOTAL PROTEIN: 7.2 g/dL (ref 6.5–8.1)

## 2017-08-18 LAB — LIPASE, BLOOD: LIPASE: 13 U/L (ref 11–51)

## 2017-08-18 MED ORDER — KETOROLAC TROMETHAMINE 30 MG/ML IJ SOLN
10.0000 mg | Freq: Once | INTRAMUSCULAR | Status: AC
  Administered 2017-08-18: 9.9 mg via INTRAVENOUS
  Filled 2017-08-18: qty 1

## 2017-08-18 NOTE — ED Notes (Signed)
Family asking for an update.  Dr. Eulis Foster made aware.

## 2017-08-18 NOTE — Discharge Instructions (Signed)
Use your urinary catheters, as needed, for inability to urinate.  See your urologist next week as scheduled.  For the pain in your back and shoulder, use heat on the sore area, 3 or 4 times a day, as needed.  Take Tylenol, for pain.

## 2017-08-18 NOTE — ED Provider Notes (Signed)
[facility] DEPT Provider Note   CSN: [id number] Arrival date & time: 08/18/17  0156     History   Chief Complaint Chief Complaint  Patient presents with  . Abdominal Pain  . Chest Pain    HPI [name]. is a 83 y.o. male.  Patient presents for evaluation of pain from left shoulder to left pelvic region, which started around 1 AM this morning, and has been persistent.  The pain is worse with movement.  The pain makes him feel like he has to stand up and walk.  There has been no trauma.  He has been stooling well but having difficulty urinating.  He denies fever, chills, cough, shortness of breath, weakness or dizziness.  He has had similar pain in the past.  He uses a walker for ambulation assistance.  He denies falling.  He is eating well.  There are no other known modifying factors.   HPI  Past Medical History:  Diagnosis Date  . AAA (abdominal aortic aneurysm) ([place])   . Arthritis   . BPH (benign prostatic hyperplasia)   . Complication of anesthesia    "a tough time"  -pt unable to explain  . Glucose intolerance (impaired glucose tolerance)   . Hypertension   . Hypothyroid   . Pancreatitis 15-20 yrs ago  . Peripheral neuropathy    fingers and toes  . Renal insufficiency     Patient Active Problem List   Diagnosis Date Noted  . Onychomycosis 02/27/2015  . Pain in lower limb 02/27/2015  . Vitamin D deficiency 11/06/2014  . Hypocalcemia 09/26/2014  . Peripheral neuropathy 09/26/2014  . Thrombocytopenia ([place]) 09/26/2014  . Macular degeneration 09/25/2014  . 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 S/P left hip revision 02/10/2014  . Abdominal aortic aneurysm ([place]) 09/30/2013  . Anemia of chronic disease 08/07/2013  . Borderline diabetes mellitus 01/02/2013  . CKD (chronic kidney disease), stage III ([place]) 01/02/2013  . Hypothyroid 10/17/2011  . Smokers' cough ([place]) 10/17/2011  . Gustatory rhinitis  10/17/2011  . BPH (benign prostatic hyperplasia) 10/17/2011    Past Surgical History:  Procedure Laterality Date  . ABDOMINAL AORTAGRAM N/A 05/06/2014   Procedure: ABDOMINAL Maxcine Ham;  Surgeon: Serafina Mitchell, MD;  Location: [facility] CATH LAB;  Service: Cardiovascular;  Laterality: N/A;  . ABDOMINAL AORTIC ANEURYSM REPAIR  july 2011  . AMPUTATION Right 01/03/2013   Procedure: AMPUTATION DIGIT;  Surgeon: Wylene Simmer, MD;  Location: [facility];  Service: Orthopedics;  Laterality: Right;  RIGHT 2ND TOE AMPUTATION  . HIP PINNING,CANNULATED Left 08/08/2013   Procedure: LEFT CANNULATED HIP PINNING;  Surgeon: Rozanna Box, MD;  Location: [place];  Service: Orthopedics;  Laterality: Left;  . PROSTATE SURGERY  5 yrs ago   urethral dilation  . STERIOD INJECTION Bilateral 08/08/2013   Procedure: STEROID INJECTION;  Surgeon: Rozanna Box, MD;  Location: [facility];  Service: Orthopedics;  Laterality: Bilateral;  . TOTAL HIP REVISION Left 02/10/2014   Procedure: REMOVAL OF SYNTHESE SCREWS LEFT HIP AND CONVERSION LEFT TOTAL HIP ARTHROPLASTY;  Surgeon: Mauri Pole, MD;  Location: [facility];  Service: Orthopedics;  Laterality: Left;       Home Medications    Prior to Admission medications   Medication Sig Start Date End Date Taking? Authorizing Provider  amLODipine (NORVASC) 5 MG tablet Take 5 mg by mouth daily.  03/04/16  Yes [provider]  calcium-vitamin D 250-100 MG-UNIT tablet Take 1 tablet by mouth  2 (two) times daily.   Yes [provider]  cholecalciferol (VITAMIN D) 1000 UNITS tablet Take 2,000 Units by mouth daily.   Yes [provider]  levothyroxine (SYNTHROID, LEVOTHROID) 88 MCG tablet TAKE 1 TABLET EVERY MORNING 01/13/16  Yes Denita Lung, MD  naproxen sodium (ANAPROX) 220 MG tablet Take 220 mg by mouth daily as needed (knee pain, PRN).   Yes [provider]  tamsulosin (FLOMAX) 0.4 MG CAPS capsule Take 1 capsule (0.4 mg total) by mouth daily after supper.  01/08/15  Yes Denita Lung, MD  Vitamin D, Ergocalciferol, (DRISDOL) 50000 UNITS CAPS capsule Take 50,000 Units by mouth every 7 (seven) days. On Sunday   Yes [provider]  Vitamin D, Ergocalciferol, (DRISDOL) 50000 UNITS CAPS capsule TAKE 1 CAPSULE EVERY 7 DAYS Patient not taking: Reported on 08/18/2017 06/17/15   Denita Lung, MD    Family History Family History  Problem Relation Age of Onset  . Heart attack Mother   . Heart disease Mother   . Heart attack Father   . Diabetes Son        amputation-BKA     Social History Social History  Substance Use Topics  . Smoking status: Former Smoker    Packs/day: 0.30    Years: 60.00    Quit date: 10/02/2012  . Smokeless tobacco: Never Used  . Alcohol use 0.0 oz/week     Comment: occasioally     Allergies   Aspirin and Shellfish allergy   Review of Systems Review of Systems  All other systems reviewed and are negative.    Physical Exam Updated Vital Signs BP (!) 150/108   Pulse 89   Temp 98.5 F (36.9 C) (Oral)   Resp 16   Ht 5\' 10"  (1.778 m)   Wt 86.2 kg (190 lb)   SpO2 97%   BMI 27.26 kg/m   Physical Exam  Constitutional: He is oriented to person, place, and time. He appears well-developed.  Elderly, frail  HENT:  Head: Normocephalic and atraumatic.  Right Ear: External ear normal.  Left Ear: External ear normal.  Eyes: Pupils are equal, round, and reactive to light. Conjunctivae and EOM are normal.  Neck: Normal range of motion and phonation normal. Neck supple.  Cardiovascular: Normal rate, regular rhythm and normal heart sounds.   Pulmonary/Chest: Effort normal and breath sounds normal. He exhibits no bony tenderness.  Abdominal: Soft. He exhibits no distension. There is no tenderness. There is no guarding.  Musculoskeletal:  Mild tenderness left lumbar region, without palpable deformity or mass.  No tenderness with palpation over the cervical thoracic or lumbar spines.  Neurological: He is  alert and oriented to person, place, and time. No cranial nerve deficit or sensory deficit. He exhibits normal muscle tone. Coordination normal.  Skin: Skin is warm, dry and intact.  Psychiatric: He has a normal mood and affect. His behavior is normal.  Nursing note and vitals reviewed.    ED Treatments / Results  Labs (all labs ordered are listed, but only abnormal results are displayed) Labs Reviewed  COMPREHENSIVE METABOLIC PANEL - Abnormal; Notable for the following:       Result Value   Glucose, Bld 147 (*)    BUN 34 (*)    Creatinine, Ser 1.82 (*)    Calcium 7.5 (*)    GFR calc non Af Amer 32 (*)    GFR calc Af Amer 38 (*)    All other components within normal limits  CBC - Abnormal; Notable for the following:    RBC 3.96 (*)    Hemoglobin 11.8 (*)    HCT 35.3 (*)    RDW 15.6 (*)    Platelets 116 (*)    All other components within normal limits  URINALYSIS, ROUTINE W REFLEX MICROSCOPIC - Abnormal; Notable for the following:    Hgb urine dipstick SMALL (*)    Protein, ur 100 (*)    Bacteria, UA RARE (*)    All other components within normal limits  LIPASE, BLOOD  I-STAT TROPONIN, ED  POCT I-STAT TROPONIN I    EKG  EKG Interpretation  Date/Time:  Friday August 18 2017 03:05:30 EDT Ventricular Rate:  99 PR Interval:    QRS Duration: 93 QT Interval:  406 QTC Calculation: 516 R Axis:   -47 Text Interpretation:  Sinus rhythm with first degree AV block Left anterior fascicular block Borderline low voltage, extremity leads Borderline repolarization abnormality Prolonged QT interval Baseline wander in lead(s) III aVL Confirmed by Molpus, John [id number]) on 08/18/2017 7:25:52 AM       Radiology No results found.  Procedures Procedures (including critical care time)  Medications Ordered in ED Medications  ketorolac (TORADOL) 30 MG/ML injection 9.9 mg (9.9 mg Intravenous Given 08/18/17 0749)     Initial Impression / Assessment and Plan / ED Course  I have  reviewed the triage vital signs and the nursing notes.  Pertinent labs & imaging results that were available during my care of the patient were reviewed by me and considered in my medical decision making (see chart for details).      No data found.   At discharge- reevaluation with update and discussion. After initial assessment and treatment, an updated evaluation reveals he is comfortable and has no further complaints.  He wishes to continue at home as needed urinary catheterization, which he has been doing for quite some time.  Findings were discussed with the patient, and his son, all questions answered.  The patient has any short-term follow-up with urology scheduled.Daleen Bo L      Final Clinical Impressions(s) / ED Diagnoses   Final diagnoses:  Back pain, unspecified back location, unspecified back pain laterality, unspecified chronicity  Urinary retention   Musculoskeletal back pain, ongoing and chronic.  Doubt acute infectious process, no fracture or metabolic instability.  Chronic urinary retention intermittently treated with home self-catheterization.  There is no indication for discharge with Foley catheterization at this time  Nursing Notes Reviewed/ Care Coordinated Applicable Imaging Reviewed Interpretation of Laboratory Data incorporated into ED treatment  The patient appears reasonably screened and/or stabilized for discharge and I doubt any other medical condition or other [facility] requiring further screening, evaluation, or treatment in the ED at this time prior to discharge.  Plan: Home Medications-continue usual medications; Home Treatments-rest, self cath as needed; return here if the recommended treatment, does not improve the symptoms; Recommended follow up-neurology follow-up as scheduled   New Prescriptions Discharge Medication List as of 08/18/2017 10:57 AM       Daleen Bo, MD 08/21/17 1103

## 2017-08-18 NOTE — ED Triage Notes (Addendum)
Patient states that his abdomen up to his neck is hurting. Patient has a hx of an AAA repair in 05/2010. Patient states it is pains all over.

## 2017-12-07 ENCOUNTER — Ambulatory Visit: Payer: Medicare HMO | Admitting: Podiatry

## 2018-08-09 DIAGNOSIS — L928 Other granulomatous disorders of the skin and subcutaneous tissue: Secondary | ICD-10-CM | POA: Diagnosis not present

## 2018-08-09 DIAGNOSIS — L97421 Non-pressure chronic ulcer of left heel and midfoot limited to breakdown of skin: Secondary | ICD-10-CM | POA: Diagnosis not present

## 2018-08-09 DIAGNOSIS — L97519 Non-pressure chronic ulcer of other part of right foot with unspecified severity: Secondary | ICD-10-CM

## 2018-08-22 ENCOUNTER — Ambulatory Visit (HOSPITAL_BASED_OUTPATIENT_CLINIC_OR_DEPARTMENT_OTHER)
Admission: RE | Admit: 2018-08-22 | Discharge: 2018-08-22 | Disposition: A | Discharge status: Home / Self Care | Payer: Medicare HMO | Source: Ambulatory Visit | Attending: Medical | Admitting: Medical

## 2018-08-22 ENCOUNTER — Telehealth: Payer: Self-pay | Admitting: Medical

## 2018-08-22 ENCOUNTER — Encounter: Payer: Self-pay | Admitting: Medical

## 2018-08-22 ENCOUNTER — Ambulatory Visit (INDEPENDENT_AMBULATORY_CARE_PROVIDER_SITE_OTHER): Payer: Medicare HMO | Admitting: Medical

## 2018-08-22 VITALS — BP 117/73 | HR 84 | Temp 97.6°F | Resp 16 | Ht 70.0 in | Wt 165.8 lb

## 2018-08-22 DIAGNOSIS — L089 Local infection of the skin and subcutaneous tissue, unspecified: Secondary | ICD-10-CM | POA: Diagnosis not present

## 2018-08-22 DIAGNOSIS — R05 Cough: Secondary | ICD-10-CM

## 2018-08-22 DIAGNOSIS — R739 Hyperglycemia, unspecified: Secondary | ICD-10-CM

## 2018-08-22 DIAGNOSIS — M7989 Other specified soft tissue disorders: Secondary | ICD-10-CM | POA: Insufficient documentation

## 2018-08-22 DIAGNOSIS — L97529 Non-pressure chronic ulcer of other part of left foot with unspecified severity: Secondary | ICD-10-CM

## 2018-08-22 DIAGNOSIS — R339 Retention of urine, unspecified: Secondary | ICD-10-CM

## 2018-08-22 DIAGNOSIS — M79609 Pain in unspecified limb: Secondary | ICD-10-CM

## 2018-08-22 DIAGNOSIS — D638 Anemia in other chronic diseases classified elsewhere: Secondary | ICD-10-CM

## 2018-08-22 DIAGNOSIS — M79605 Pain in left leg: Secondary | ICD-10-CM | POA: Insufficient documentation

## 2018-08-22 DIAGNOSIS — N183 Chronic kidney disease, stage 3 unspecified: Secondary | ICD-10-CM

## 2018-08-22 DIAGNOSIS — M79604 Pain in right leg: Secondary | ICD-10-CM | POA: Diagnosis not present

## 2018-08-22 DIAGNOSIS — M7122 Synovial cyst of popliteal space [Baker], left knee: Secondary | ICD-10-CM | POA: Insufficient documentation

## 2018-08-22 DIAGNOSIS — I517 Cardiomegaly: Secondary | ICD-10-CM

## 2018-08-22 DIAGNOSIS — E875 Hyperkalemia: Secondary | ICD-10-CM

## 2018-08-22 DIAGNOSIS — M7121 Synovial cyst of popliteal space [Baker], right knee: Secondary | ICD-10-CM | POA: Insufficient documentation

## 2018-08-22 DIAGNOSIS — R059 Cough, unspecified: Secondary | ICD-10-CM

## 2018-08-22 DIAGNOSIS — D649 Anemia, unspecified: Secondary | ICD-10-CM

## 2018-08-22 DIAGNOSIS — I4891 Unspecified atrial fibrillation: Secondary | ICD-10-CM

## 2018-08-22 LAB — CBC WITH DIFFERENTIAL/PLATELET
Basophils Absolute: 0.1 10*3/uL (ref 0.0–0.1)
Basophils Relative: 1.5 % (ref 0.0–3.0)
EOS PCT: 0.8 % (ref 0.0–5.0)
Eosinophils Absolute: 0.1 10*3/uL (ref 0.0–0.7)
HEMATOCRIT: 29.6 % — AB (ref 39.0–52.0)
HEMOGLOBIN: 9.7 g/dL — AB (ref 13.0–17.0)
LYMPHS PCT: 12.2 % (ref 12.0–46.0)
Lymphs Abs: 0.8 10*3/uL (ref 0.7–4.0)
MCHC: 32.7 g/dL (ref 30.0–36.0)
MCV: 99.4 fl (ref 78.0–100.0)
MONOS PCT: 8.9 % (ref 3.0–12.0)
Monocytes Absolute: 0.6 10*3/uL (ref 0.1–1.0)
Neutro Abs: 4.9 10*3/uL (ref 1.4–7.7)
Neutrophils Relative %: 76.6 % (ref 43.0–77.0)
Platelets: 161 10*3/uL (ref 150.0–400.0)
RBC: 2.98 Mil/uL — AB (ref 4.22–5.81)
RDW: 20.1 % — ABNORMAL HIGH (ref 11.5–15.5)
WBC: 6.4 10*3/uL (ref 4.0–10.5)

## 2018-08-22 LAB — COMPREHENSIVE METABOLIC PANEL
ALBUMIN: 3.2 g/dL — AB (ref 3.5–5.2)
ALK PHOS: 80 U/L (ref 39–117)
ALT: 14 U/L (ref 0–53)
AST: 15 U/L (ref 0–37)
BILIRUBIN TOTAL: 0.6 mg/dL (ref 0.2–1.2)
BUN: 30 mg/dL — ABNORMAL HIGH (ref 6–23)
CO2: 17 mEq/L — ABNORMAL LOW (ref 19–32)
Calcium: 6.8 mg/dL — ABNORMAL LOW (ref 8.4–10.5)
Chloride: 112 mEq/L (ref 96–112)
Creatinine, Ser: 1.73 mg/dL — ABNORMAL HIGH (ref 0.40–1.50)
GFR: 40.03 mL/min — AB (ref >60.00)
Glucose, Bld: 102 mg/dL — ABNORMAL HIGH (ref 70–99)
POTASSIUM: 5.5 meq/L — AB (ref 3.5–5.1)
Sodium: 138 mEq/L (ref 135–145)
TOTAL PROTEIN: 6.3 g/dL (ref 6.0–8.3)

## 2018-08-22 LAB — HEMOGLOBIN A1C: Hgb A1c MFr Bld: 5.9 % (ref 4.6–6.5)

## 2018-08-22 LAB — BRAIN NATRIURETIC PEPTIDE: PRO B NATRI PEPTIDE: 371 pg/mL — AB (ref 0.0–100.0)

## 2018-08-22 MED ORDER — DOXYCYCLINE HYCLATE 100 MG PO TABS: 100.0000 mg | ORAL_TABLET | Freq: Two times a day (BID) | ORAL | 0 refills | Status: DC

## 2018-08-22 NOTE — Progress Notes (Signed)
Subjective:    Patient ID: Christian Daft., male    DOB: [birth year], 84 y.o.   MRN: [id number]  HPI  Pt in today for first time. He states feels ok.  Pt was seeing [facility] just recently.   Pt has low thyroid hx.  Pt history of atrial fibrillation just recenlty. Now on betablocker.   Pt has anemia on chart review.  Pt had some recent urinary retention(known bph) and known low gfr. Pt seeing urologist on August 31, 2018. Also will see nephrologist on Aug 27, 2018.   Hospital Course:  [name] is a 84 years old male with past medical history of macular degeneration, hypothyroidism, hypertension, diabetes mellitus, chronic kidney disease, BPH presented to the emergency room with complaints of with aches and pains all over his body. This has been present for about 3 weeks. He has a past medical history of benign prostatic hypertrophy and is followed by Dr. Harlow Asa, urology as outpatient. He had cystoscopy done a few days prior to becoming ill and was told everything looked okay. Patient stated that he had been having fever, chills, sweats, loss of appetite, and body aches for 2 to 3 weeks.Marland Kitchen He was also unable to urinate for 24 hours prior to admission. His neighbor who is a nurse inserted a urinary catheter that drained purulent urine. Patient was then brought into the hospital.   Patient was admitted to the hospital and following conditions were addressed during hospitalization,  Sepsis secondary to UTI. Initial urine culture showed multiple organisms in urine culture. Repeat urine culture was performed which showed Pseudomonas. Infectious disease was consulted and suggested intravenous meropenem. Patient received a PICC line for IV meropenem and will receive a total of 10-day of antibiotic.  Bilateral pneumonia. Possible intermittent aspiration. Initially had received Zosyn, subsequently was continued on meropenem. Patient did not have further pulmonary symptoms while in the  hospital. Patient was seen by speech therapy and recommended dysphagia diet with nectar thick liquids.  Acute kidney injury on chronic kidney disease with metabolic acidosis on presentation. Baseline creatinine of around 1.8-1.9. Patient's renal function improved with IV fluids and conservative treatment.  Urinary retention on presentation. Foley catheter was placed and initially and a voiding trial was done but patient failed voiding trial. Urology follow the patient and recommended continuation of Foley catheter until outpatient follow-up. Patient does have a history of intermittent catheterization in the past but due to macular degeneration and difficulties with vision patient has not been able to perform it. Patient will need to follow-up with urology as outpatient.  Hypocalcemia. Patient was on calcium supplements at home. Doses of calcium have been increased on discharge. Patient did not have symptoms for hypocalcemia on discharge.   Hypothyroidism. Continue on Synthroid.  Hypokalemia. Was replenished.  New onset atrial fibrillation. Patient was continued On metoprolol with controlled rate. CHA2DS2-VASC score was 3. Patient was put on aspirin which he tolerated. I spoke with the patient's son on the phone in detail about anticoagulation and the risk. Patient's son stated that his father had high risk of complications from fall and bleeds easily. He was reluctant on putting any strong anticoagulation for the patient. I spoke with him about aspirin and he was okay with that. I stated the risk versus benefits of anticoagulation including the possibility of stroke. Additionally, patient recently had a fall and does not have a good follow-up  Diabetes mellitus type 2. Not on antidiabetic medication. Glucose remained stable  Moderate protein calorie  malnutrition. Patient will need to be encouraged on oral nutrition.  Debility, weakness. Patient was seen by physical therapy, occupational  therapy. Skilled nursing facility versus home health was recommended but the decision was disposition home with home health.  Disposition. At this time, patient has been considered stable for disposition home. I also spoke with the patient's son on the phone and updated him about the clinical condition of the patient and the need for outpatient follow-up   Some slight prominent swelling of both legs. Mild intermittent cough. Hx of some cardiomegaly. Son states more prominent swelling past week.  Left heel wound over past week. Left side heel some mild serosanginuous drainage.  Rt great toe- small wound.   Pt has home health nurse doing some treatments.     Review of Systems  Constitutional: Negative for chills, diaphoresis and fatigue.  Respiratory: Positive for cough. Negative for chest tightness and wheezing.   Cardiovascular: Negative for chest pain and palpitations.  Gastrointestinal: Negative for abdominal pain.  Genitourinary: Positive for decreased urine volume and difficulty urinating. Negative for discharge, frequency, penile swelling, scrotal swelling and testicular pain.       See hpi and ED hx.  Musculoskeletal:       See hpi.  Skin:       See hpi.  Neurological: Negative for dizziness, tremors, seizures, weakness and headaches.  Hematological: Negative for adenopathy.  Psychiatric/Behavioral: Negative for behavioral problems, decreased concentration, dysphoric mood, hallucinations, self-injury and suicidal ideas. The patient is not hyperactive.        Objective:   Physical Exam  General Mental Status- Alert. General Appearance- Not in acute distress.   Skin General: Color- Normal Color. Moisture- Normal Moisture.  Neck Carotid Arteries- Normal color. Moisture- Normal Moisture. No JVD.  Chest and Lung Exam Auscultation: Breath Sounds:-Normal.  Cardiovascular Auscultation:Rythm- Regular/irregular.  Sounds atrial fibrillation light rate controlled  presently. Murmurs & Other Heart Sounds:Auscultation of the heart reveals- No Murmurs.  Abdomen Inspection:-Inspeection Normal. Palpation/Percussion:Note:No mass. Palpation and Percussion of the abdomen reveal- Non Tender, Non Distended + BS, no rebound or guarding.    Neurologic Cranial Nerve exam:- CN III-XII intact(No nystagmus), symmetric smile. Strength:- 5/5 equal and symmetric strength both upper and lower extremities.  Lower ext-bilateral 2+ pedal edema.  Calves appear symmetric.  Right popliteal fossa has a minimal bulge and faint positive Homans sign.  Skin/foot exam- on patient's left heel he has 7 to 8 mm sized ulcer in width(this wound looks and feels moist.).  The breakdown is past epidermis.  On patient's right foot he has small superficial broken down area on his great toe.  Second toe appears to have been amputated past.      Assessment & Plan:  Cell [phone].  For your history of recent urinary retention and BPH, keep follow-up appointment with urologist next week.  For history of chronic kidney disease, keep appointment with nephrologist.  For recent new onset atrial fibrillation, I did place referral for you to see cardiologist in our building.  Hopefully they will call you soon.  If you do not get a call from them by early next week then please call us for update on that referral.  It is very important that you continue with beta-blocker to control the rate.  Also continue with aspirin.  Cardiologist will likely touch base with you again on a benefits versus risk of medication such as Xarelto.  For new onset ulceration of left heel and right great toe, I did go ahead  and place podiatry referral.  My goal is to get you in by early next week.  During the interim we did get a culture from the left heel wound and I am placing you on doxycycline antibiotic.  Rx advisement given.  Also recommend that you get some probiotics over-the-counter taking the antibiotic.  For  significant left lower extremity swelling with a recent cardiomegaly on chest x-ray, I did place a chest x-ray order to repeat post hospitalization and placed BNP lab.  In addition to the light of the bilateral lower extremity swelling placed ultrasound orders to be done of both legs.  Ultrasounds can be done at 230 this afternoon.  But go ahead and get the chest x-ray now.  Also has follow-up for your hospitalization and chronic medical problems we will get CBC, CMP and A1c.  Will evaluate if you are diabetic as that could impact healing of those foot wounds.  Follow-up in 7 to 10 days or as needed.  45 minutes spent with patient.  50% of time spent counseling on recent new symptom onset pedal edema post hospitalization as well as new ulcerations breakdown on his feet.  Counseled on differential diagnosis of the pedal edema and a work-up required.  Regarding ulcer areas, counseled regarding this as well.  In addition on this visit needed to coordinate care regarding referral to cardiologist for new onset atrial fibrillation as well as referral to podiatrist.  Mackie Pai, PA-C

## 2018-08-22 NOTE — Telephone Encounter (Signed)
Future labs placed to get done on 08-24-2018.

## 2018-08-22 NOTE — Patient Instructions (Addendum)
For your history of recent urinary retention and BPH, keep follow-up appointment with urologist next week.  For history of chronic kidney disease, keep appointment with nephrologist.  For recent new onset atrial fibrillation, I did place referral for you to see cardiologist in our building.  Hopefully they will call you soon.  If you do not get a call from them by early next week then please call us for update on that referral.  It is very important that you continue with beta-blocker to control the rate.  Also continue with aspirin.  Cardiologist will likely touch base with you again on a benefits versus risk of medication such as Xarelto.  For new onset ulceration of left heel and right great toe, I did go ahead and place podiatry referral.  My goal is to get you in by early next week.  During the interim we did get a culture from the left heel wound and I am placing you on doxycycline antibiotic.  Rx advisement given.  Also recommend that you get some probiotics over-the-counter taking the antibiotic.  For significant left lower extremity swelling with a recent cardiomegaly on chest x-ray, I did place a chest x-ray order to repeat post hospitalization and placed BNP lab.  In addition to the light of the bilateral lower extremity swelling placed ultrasound orders to be done of both legs.  Ultrasounds can be done at 230 this afternoon.  But go ahead and get the chest x-ray now.  Also has follow-up for your hospitalization and chronic medical problems we will get CBC, CMP and A1c.  Will evaluate if you are diabetic as that could impact healing of those foot wounds.  Follow-up in 7 to 10 days or as needed.

## 2018-08-23 ENCOUNTER — Telehealth: Payer: Self-pay | Admitting: Medical

## 2018-08-23 DIAGNOSIS — R339 Retention of urine, unspecified: Secondary | ICD-10-CM

## 2018-08-23 NOTE — Telephone Encounter (Addendum)
Copied from [place] [id number]. Topic: General - Other >> Aug 23, 2018  2:52 PM Lennox Solders wrote: Reason for CRM: pt son craig is calling the pt urologist is out of the office until 10-7 and pt only has 2 catheter left. Pt needs new rx cure ultra coude pre-lubricant catheter size m14c Pt send new rx  fax to [facility] [phone]

## 2018-08-24 ENCOUNTER — Ambulatory Visit: Payer: Self-pay

## 2018-08-24 ENCOUNTER — Ambulatory Visit: Payer: Medicare HMO | Admitting: Podiatry

## 2018-08-24 VITALS — BP 83/56 | HR 88

## 2018-08-24 DIAGNOSIS — L97529 Non-pressure chronic ulcer of other part of left foot with unspecified severity: Secondary | ICD-10-CM | POA: Diagnosis not present

## 2018-08-24 DIAGNOSIS — I872 Venous insufficiency (chronic) (peripheral): Secondary | ICD-10-CM

## 2018-08-24 DIAGNOSIS — B351 Tinea unguium: Secondary | ICD-10-CM

## 2018-08-24 DIAGNOSIS — L97519 Non-pressure chronic ulcer of other part of right foot with unspecified severity: Secondary | ICD-10-CM | POA: Diagnosis not present

## 2018-08-24 DIAGNOSIS — L97511 Non-pressure chronic ulcer of other part of right foot limited to breakdown of skin: Secondary | ICD-10-CM

## 2018-08-24 DIAGNOSIS — E1142 Type 2 diabetes mellitus with diabetic polyneuropathy: Secondary | ICD-10-CM | POA: Diagnosis not present

## 2018-08-24 DIAGNOSIS — I878 Other specified disorders of veins: Secondary | ICD-10-CM

## 2018-08-24 DIAGNOSIS — R600 Localized edema: Secondary | ICD-10-CM

## 2018-08-24 DIAGNOSIS — E08621 Diabetes mellitus due to underlying condition with foot ulcer: Secondary | ICD-10-CM | POA: Diagnosis not present

## 2018-08-24 DIAGNOSIS — L97421 Non-pressure chronic ulcer of left heel and midfoot limited to breakdown of skin: Secondary | ICD-10-CM

## 2018-08-24 DIAGNOSIS — E119 Type 2 diabetes mellitus without complications: Secondary | ICD-10-CM

## 2018-08-24 MED ORDER — SILVER SULFADIAZINE 1 % EX CREA: TOPICAL_CREAM | CUTANEOUS | 0 refills | Status: DC

## 2018-08-24 NOTE — Telephone Encounter (Signed)
I am giving you hand written rx for catheter. Would you call and investigate with [facility]. Explain he is about to run out of catheter. His urologist is out of town and we are getting last minute refill request before the weekend. Fax over order. But also ask how many cup in pack. Want to put number to dispense on script.   Also let pt and son know trying to get this done. If there are issues even if urologist out of town urologist usually have partners covering while on vacation. Unless urologist in solo practice.

## 2018-08-24 NOTE — Progress Notes (Signed)
Subjective:  Patient ID: Christian Daft., male    DOB: December 22, 1930,  MRN: [id number]  Chief Complaint  Patient presents with  . Nail Problem    bilateral thick painful toenails  . Diabetic Ulcer    right great toe and left heel    83 y.o. male presents for wound care.  Reports new ulcer to the right great toe states that he scraped his toe.  Has a history of a left heel ulcer.  Has had a prior ulcer this area that he had to undergo surgical debridement.  Denies nausea vomiting fever chills.  Last saw his PCP Dr. Ara Kussmaul at [facility] 1 week ago.  Last A1c 08/22/2018 was 5.9.  Does not check his sugar every day.  Review of Systems: Negative except as noted in the HPI. Denies N/V/F/Ch.  Past Medical History:  Diagnosis Date  . AAA (abdominal aortic aneurysm) ([place])   . Arthritis   . BPH (benign prostatic hyperplasia)   . Complication of anesthesia    "a tough time"  -pt unable to explain  . Glucose intolerance (impaired glucose tolerance)   . Hypertension   . Hypothyroid   . Pancreatitis 15-20 yrs ago  . Peripheral neuropathy    fingers and toes  . Renal insufficiency     Current Outpatient Medications:  .  calcium-vitamin D 250-100 MG-UNIT tablet, Take 1 tablet by mouth 2 (two) times daily., Disp: , Rfl:  .  cholecalciferol (VITAMIN D) 1000 UNITS tablet, Take 2,000 Units by mouth daily., Disp: , Rfl:  .  doxycycline (VIBRA-TABS) 100 MG tablet, Take 1 tablet (100 mg total) by mouth 2 (two) times daily., Disp: 20 tablet, Rfl: 0 .  levothyroxine (SYNTHROID, LEVOTHROID) 88 MCG tablet, TAKE 1 TABLET EVERY MORNING, Disp: 90 tablet, Rfl: 0 .  meropenem (MERREM) 1 g injection, , Disp: , Rfl:  .  metoprolol tartrate (LOPRESSOR) 25 MG tablet, , Disp: , Rfl: 5 .  silver sulfADIAZINE (SILVADENE) 1 % cream, Apply pea-sized amount to wound daily., Disp: 50 g, Rfl: 0 .  Vitamin D, Ergocalciferol, (DRISDOL) 50000 UNITS CAPS capsule, TAKE 1 CAPSULE EVERY 7 DAYS, Disp: 12 capsule, Rfl: 3 .   Vitamin D, Ergocalciferol, (DRISDOL) 50000 UNITS CAPS capsule, Take 50,000 Units by mouth every 7 (seven) days. On Sunday, Disp: , Rfl:   Social History   Tobacco Use  Smoking Status Former Smoker  . Packs/day: 0.30  . Years: 60.00  . Pack years: 18.00  . Last attempt to quit: 10/02/2012  . Years since quitting: 5.8  Smokeless Tobacco Never Used    Allergies  Allergen Reactions  . Aspirin Other (See Comments)    Low bp, stomach pains  . Shellfish Allergy    Objective:   Vitals:   08/24/18 1154  BP: (!) 83/56  Pulse: 88   There is no height or weight on file to calculate BMI. Constitutional Well developed. Well nourished.  Vascular Dorsalis pedis pulses palpable bilaterally. Posterior tibial pulses palpable bilaterally. Capillary refill normal to all digits.  No cyanosis or clubbing noted. Pedal hair growth normal. Pitting edema bilateral lower extremities  Neurologic Normal speech. Oriented to person, place, and time. Protective sensation absent  Dermatologic  left posterior heel ulceration measuring approximately 0.5 x 1 with granular base, minimal drainage no warmth erythema no signs of acute infection.  Right great toe partial-thickness abrasion with granular base.  No warmth no erythema no signs of acute infection.  Thin shiny skin bilaterally with varicosities  present.  Phalange ectasias.  Orthopedic: No pain to palpation either foot.   Radiographs: None taken today. Assessment:   1. Bilateral diabetic foot ulcer associated with secondary diabetes mellitus ([place])   2. Ulcer of great toe, right, limited to breakdown of skin ([place])   3. Skin ulcer of left heel, limited to breakdown of skin ([place])   4. Venous (peripheral) insufficiency   5. Venous stasis   6. Localized edema   7. DM type 2 with diabetic peripheral neuropathy ([place])   8. Onychomycosis   9. Encounter for diabetic foot exam [facility])    Plan:  Patient was evaluated and treated and all questions  answered.  Diabetes with DPN, Onychomycosis -Educated on diabetic footcare. Diabetic risk level 3 -Nails x10 debrided sharply and manually with large nail nipper and rotary burr.   Procedure: Nail Debridement Rationale: Patient meets criteria for routine foot care due to DPN Type of Debridement: manual, sharp debridement. Instrumentation: Nail nipper, rotary burr. Number of Nails: 9   Ulcer left heel, right great toe. -Secondary to abrasion.  Should heal quickly.  Dressed with Silvadene.  No debridement performed today. -We will follow-up in 2 weeks for recheck.  Venous insufficiency with venous stasis and history of venous ulceration -Multilayer compression wraps applied bilaterally -Orders for home health care to change Unna boots Thurs/Friday.  Verbal orders relayed to his nurse Nira Conn with [facility].  Return in about 2 weeks (around 09/07/2018) for Wound Care, Bilateral.

## 2018-08-24 NOTE — Telephone Encounter (Signed)
Author phoned pt to assess quantity need, but no answer. Chief Strategy Officer then phoned son, craig, who confirmed kind of catheter needed, and stated two boxes (60 total) would be good for the month. Author phoned [facility] to relay and confirm fax number. Rx faxed to [phone].

## 2018-08-26 LAB — WOUND CULTURE
MICRO NUMBER: 91183591
SPECIMEN QUALITY: ADEQUATE

## 2018-08-27 ENCOUNTER — Ambulatory Visit: Payer: Medicare HMO | Admitting: Podiatry

## 2018-08-28 ENCOUNTER — Other Ambulatory Visit: Payer: Self-pay

## 2018-08-28 DIAGNOSIS — R339 Retention of urine, unspecified: Secondary | ICD-10-CM

## 2018-08-28 NOTE — Progress Notes (Signed)
Order faxed to [facility]

## 2018-09-10 ENCOUNTER — Ambulatory Visit: Payer: Medicare HMO | Admitting: Podiatry

## 2018-09-10 DIAGNOSIS — L97511 Non-pressure chronic ulcer of other part of right foot limited to breakdown of skin: Secondary | ICD-10-CM

## 2018-09-10 DIAGNOSIS — L97421 Non-pressure chronic ulcer of left heel and midfoot limited to breakdown of skin: Secondary | ICD-10-CM

## 2018-09-10 DIAGNOSIS — L97529 Non-pressure chronic ulcer of other part of left foot with unspecified severity: Secondary | ICD-10-CM

## 2018-09-10 DIAGNOSIS — L97519 Non-pressure chronic ulcer of other part of right foot with unspecified severity: Secondary | ICD-10-CM

## 2018-09-10 DIAGNOSIS — E08621 Diabetes mellitus due to underlying condition with foot ulcer: Secondary | ICD-10-CM | POA: Diagnosis not present

## 2018-09-11 ENCOUNTER — Telehealth: Payer: Self-pay | Admitting: Medical

## 2018-09-11 NOTE — Telephone Encounter (Signed)
Ok to use dressing but looks like he is due for tetanus. So they can get that scheduled this week. If they want I can look at wound. Older pt. Give him convenient appointment slot.

## 2018-09-11 NOTE — Telephone Encounter (Signed)
Copied from [place] [phone]. Topic: Quick Communication - Home Health Verbal Orders >> Sep 11, 2018  8:53 AM Lennox Solders wrote: Caller/Agency: [facility] Callback [phone] Pt was at a woodpile and log fall on his leg. Pt has skin tear on left leg near ankle. Nira Conn would like verbal order to use mepilex dressing two to 3 times a week. Ok to leave vm

## 2018-09-12 NOTE — Telephone Encounter (Signed)
Verbal Orders given. Called pt's son left message to call and schedule pt sometime this week.

## 2018-09-13 ENCOUNTER — Telehealth: Payer: Self-pay | Admitting: *Deleted

## 2018-09-13 NOTE — Telephone Encounter (Signed)
Received Physician Orders from [facility]; forwarded to provider/SLS 10/24

## 2018-09-17 NOTE — Progress Notes (Signed)
Subjective:  Patient ID: [name]., male    DOB: [birth year],  MRN: [id number]  Chief Complaint  Patient presents with  . Foot Ulcer    F/U R hallux and L heel ulcers Pt. stated," it's scabbing so it's healing up good.'     84 y.o. male presents for wound care. Believes wounds to be improving.  Review of Systems: Negative except as noted in the HPI. Denies N/V/F/Ch.  Past Medical History:  Diagnosis Date  . AAA (abdominal aortic aneurysm) ([place])   . Arthritis   . BPH (benign prostatic hyperplasia)   . Complication of anesthesia    "a tough time"  -pt unable to explain  . Glucose intolerance (impaired glucose tolerance)   . Hypertension   . Hypothyroid   . Pancreatitis 15-20 yrs ago  . Peripheral neuropathy    fingers and toes  . Renal insufficiency     Current Outpatient Medications:  .  calcium-vitamin D 250-100 MG-UNIT tablet, Take 1 tablet by mouth 2 (two) times daily., Disp: , Rfl:  .  cholecalciferol (VITAMIN D) 1000 UNITS tablet, Take 2,000 Units by mouth daily., Disp: , Rfl:  .  doxycycline (VIBRA-TABS) 100 MG tablet, Take 1 tablet (100 mg total) by mouth 2 (two) times daily., Disp: 20 tablet, Rfl: 0 .  levothyroxine (SYNTHROID, LEVOTHROID) 88 MCG tablet, TAKE 1 TABLET EVERY MORNING, Disp: 90 tablet, Rfl: 0 .  meropenem (MERREM) 1 g injection, , Disp: , Rfl:  .  metoprolol tartrate (LOPRESSOR) 25 MG tablet, , Disp: , Rfl: 5 .  silver sulfADIAZINE (SILVADENE) 1 % cream, Apply pea-sized amount to wound daily., Disp: 50 g, Rfl: 0 .  Vitamin D, Ergocalciferol, (DRISDOL) 50000 UNITS CAPS capsule, TAKE 1 CAPSULE EVERY 7 DAYS, Disp: 12 capsule, Rfl: 3 .  Vitamin D, Ergocalciferol, (DRISDOL) 50000 UNITS CAPS capsule, Take 50,000 Units by mouth every 7 (seven) days. On Sunday, Disp: , Rfl:   Social History   Tobacco Use  Smoking Status Former Smoker  . Packs/day: 0.30  . Years: 60.00  . Pack years: 18.00  . Last attempt to quit: 10/02/2012  . Years since quitting:  5.9  Smokeless Tobacco Never Used    Allergies  Allergen Reactions  . Aspirin Other (See Comments)    Low bp, stomach pains  . Shellfish Allergy    Objective:   There were no vitals filed for this visit. There is no height or weight on file to calculate BMI. Constitutional Well developed. Well nourished.  Vascular Dorsalis pedis pulses palpable bilaterally. Posterior tibial pulses palpable bilaterally. Capillary refill normal to all digits.  No cyanosis or clubbing noted. Pedal hair growth normal. Pitting edema bilateral lower extremities  Neurologic Normal speech. Oriented to person, place, and time. Protective sensation absent  Dermatologic Left posterior heel ulceration measuring approximately 0.3 x 0.5 with granular base, minimal drainage no warmth erythema no signs of acute infection.  Right great toe partial-thickness abrasion with granular base.  No warmth no erythema no signs of acute infection.  Thin shiny skin bilaterally with varicosities present.  Telangectasias.  Orthopedic: No pain to palpation either foot.   Radiographs: None taken today. Assessment:   1. Bilateral diabetic foot ulcer associated with secondary diabetes mellitus ([name])   2. Ulcer of great toe, right, limited to breakdown of skin ([name])   3. Skin ulcer of left heel, limited to breakdown of skin ([name])    Plan:  Patient was evaluated and treated and all questions answered.  Ulcer left heel, right great toe. -Wounds improvig. -No debridement. -Continue silvadene with   Return in about 2 weeks (around 09/24/2018) for Wound Care, Bilateral.

## 2018-09-23 IMAGING — DX DG LUMBAR SPINE COMPLETE 4+V
5 series · 5 of 5 positions shown · non-contrast
Comparison: Lumbar spine series dated December 03, 2013

CLINICAL DATA: Status post fall 4 days ago with persistent
abdominal and back pain. Obtained the x-rays was painful today.

EXAM:
LUMBAR SPINE - COMPLETE 4+ VIEW

[l-spine ap]
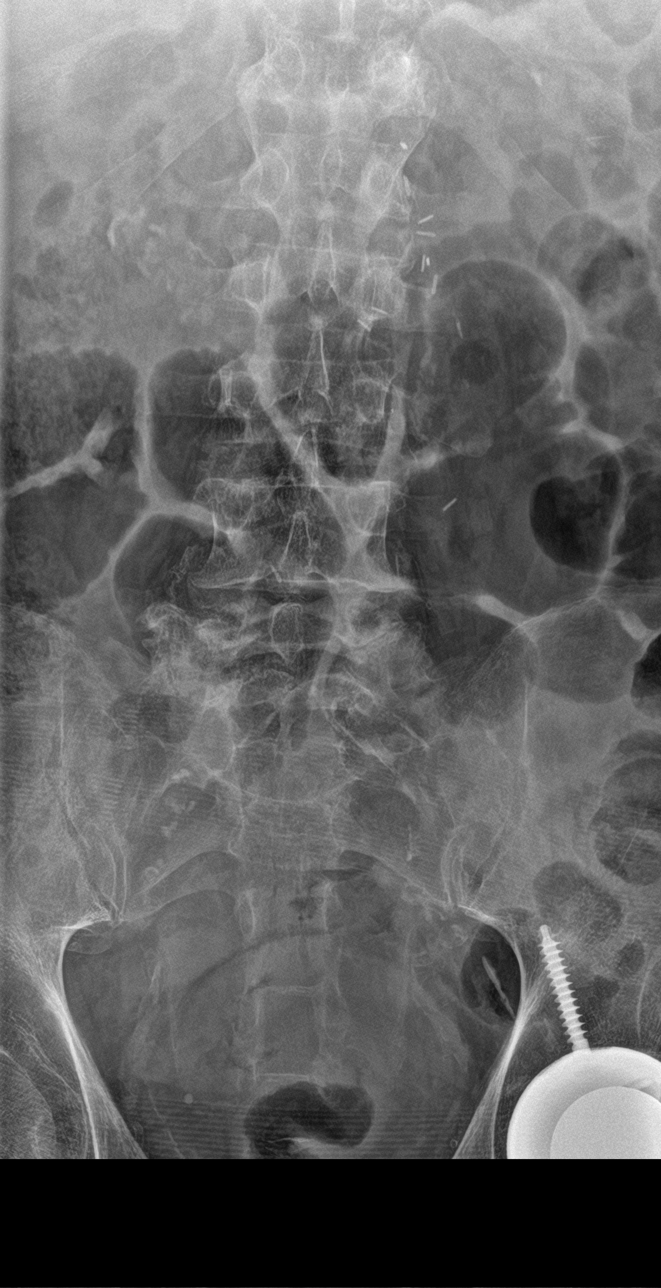

[l-spine obl (1 of 2)]
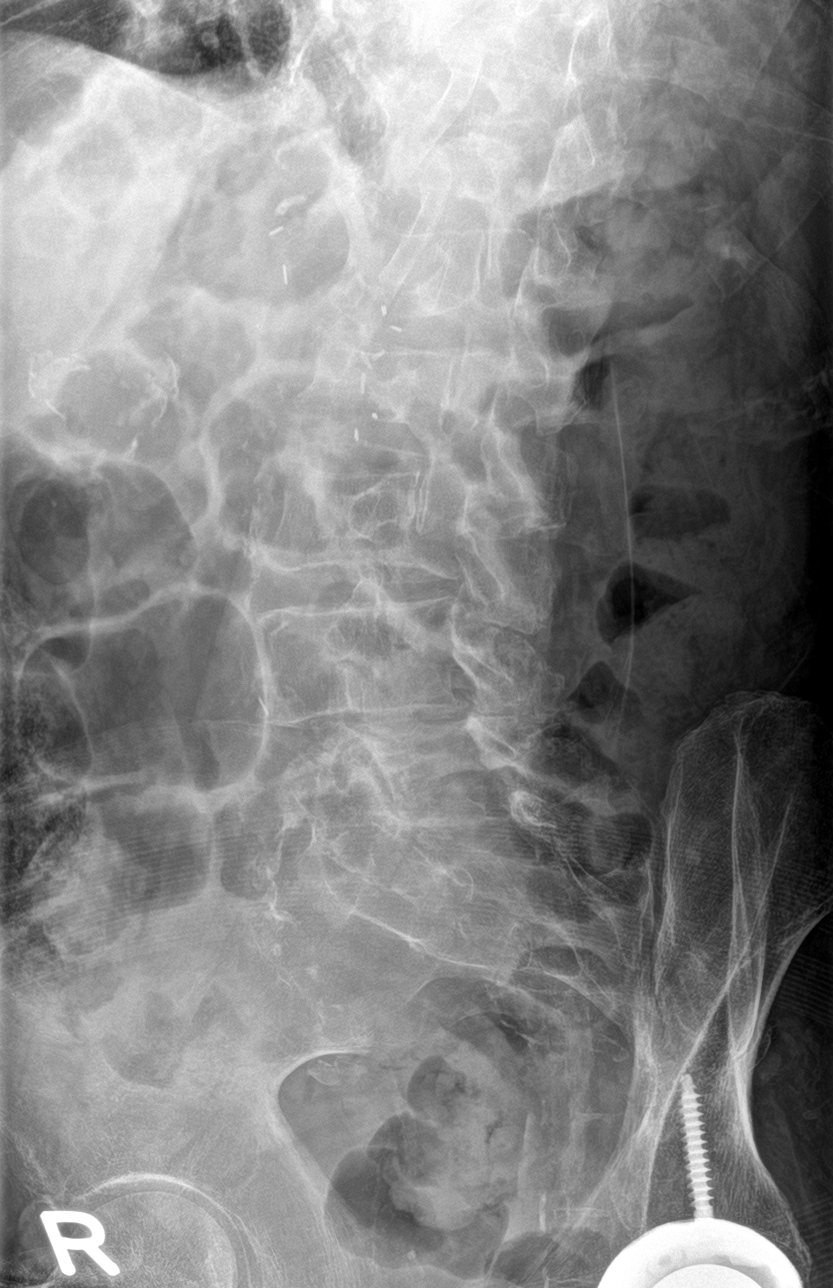

[l-spine obl (2 of 2)]
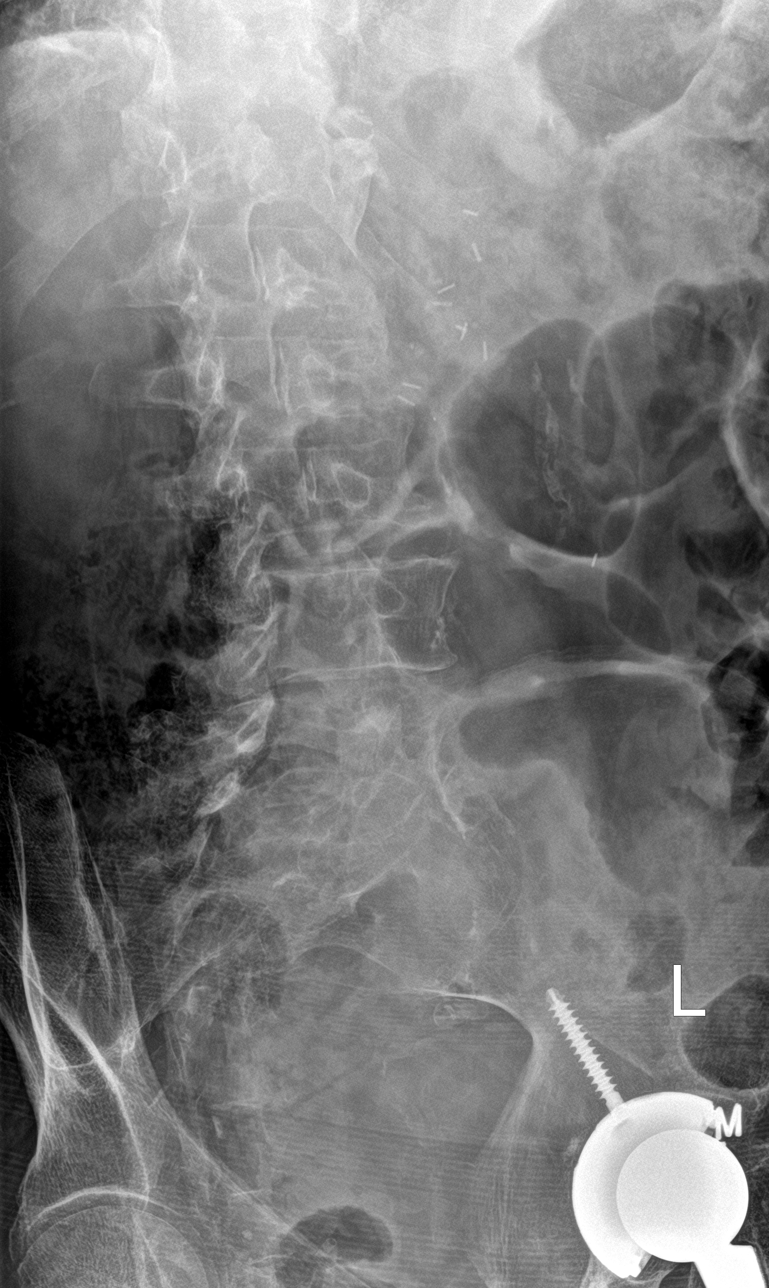

[l-spine lat]
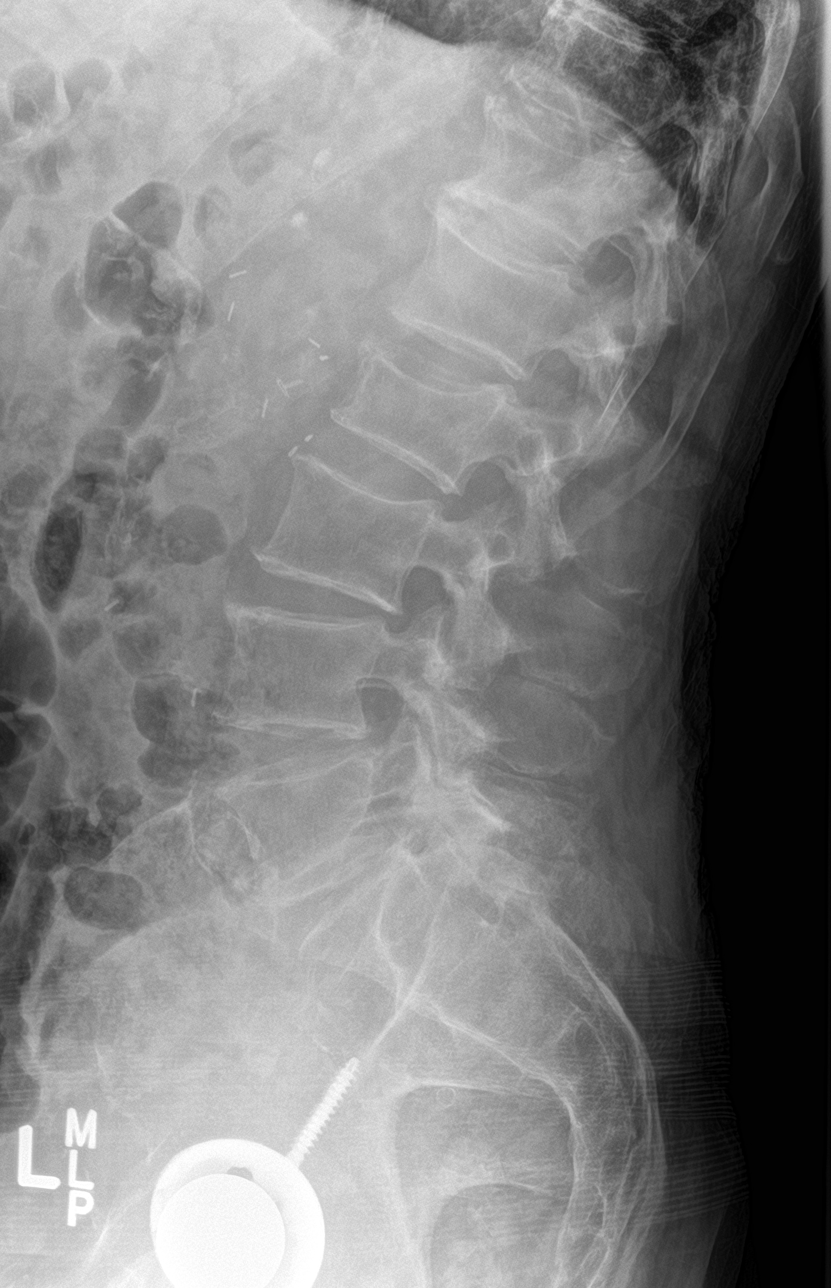

[l-spine spot]
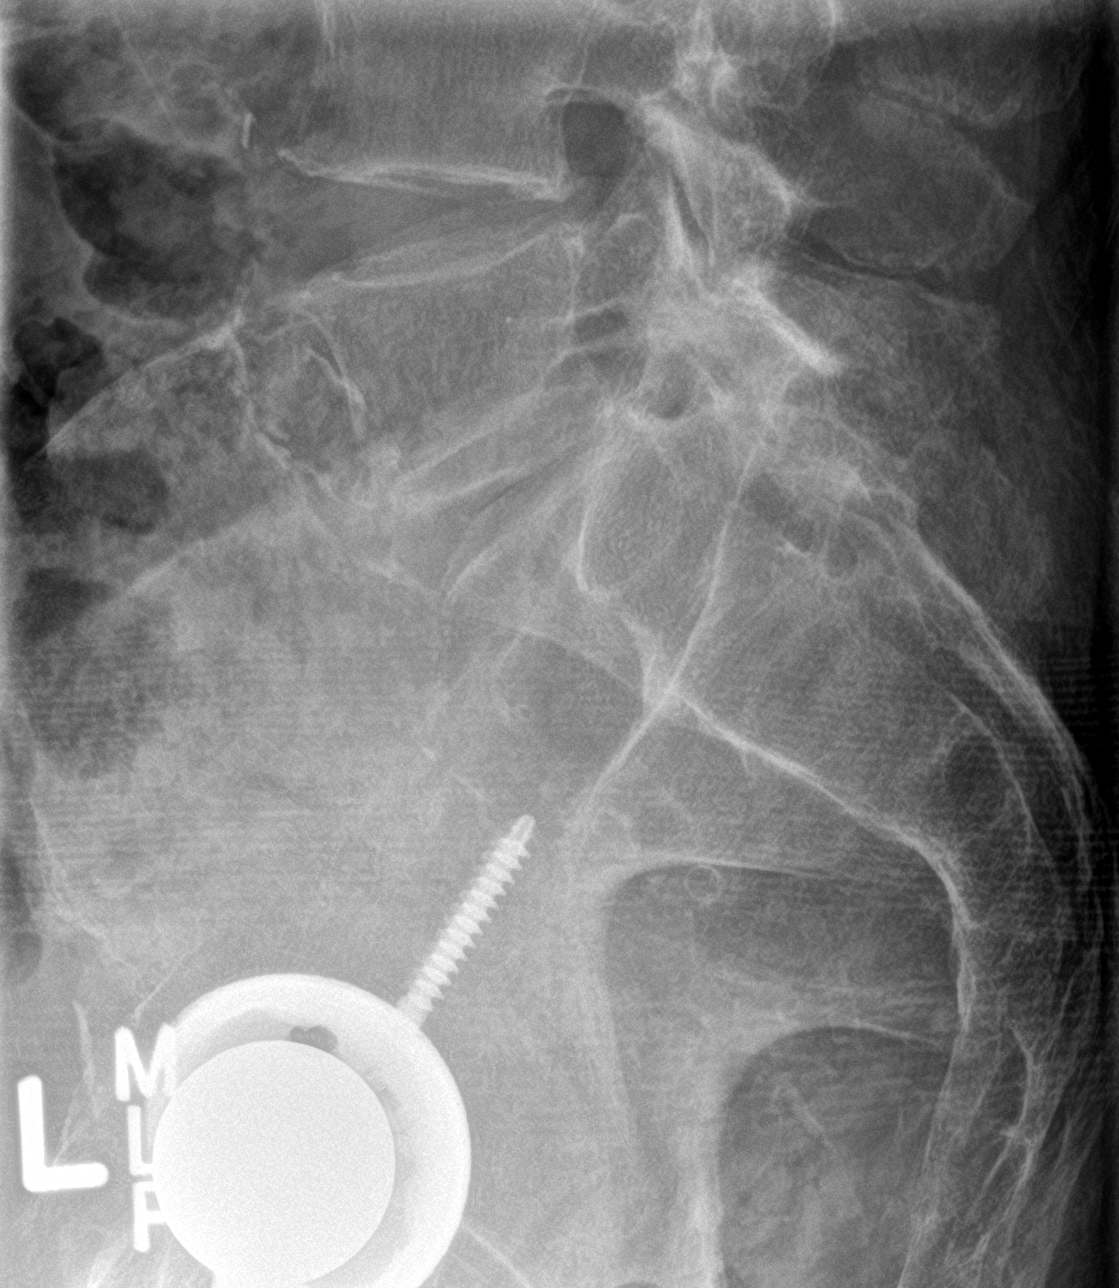

[5 of 5 positions shown; findings below may reference images not displayed]

FINDINGS: The bones are diffusely osteopenic. There is further loss of height
of the body of L2 centered on the superior endplate. The loss of
height anteriorly is 20%. No significant posterior height loss is
observed. The other lumbar vertebral bodies are preserved in height.
The disc space heights are reasonably well-maintained. There is no
spondylolisthesis. There is facet joint hypertrophy at L4-5 and at
L5-S1. The pedicles and transverse processes are intact where
visualized.
IMPRESSION: Interval worsening of the L2 compression fracture with loss of
height of 20% anteriorly now. No significant abnormality observed
elsewhere within the lumbar spine.

## 2018-09-24 ENCOUNTER — Telehealth: Payer: Self-pay | Admitting: *Deleted

## 2018-09-24 NOTE — Telephone Encounter (Signed)
Recceived Episode Detail Report for review; forwarded to provider/SLS 11/04

## 2018-09-27 ENCOUNTER — Ambulatory Visit: Payer: Medicare HMO | Admitting: Podiatry

## 2018-09-27 DIAGNOSIS — L97421 Non-pressure chronic ulcer of left heel and midfoot limited to breakdown of skin: Secondary | ICD-10-CM

## 2018-09-27 DIAGNOSIS — L97519 Non-pressure chronic ulcer of other part of right foot with unspecified severity: Secondary | ICD-10-CM

## 2018-09-27 DIAGNOSIS — L928 Other granulomatous disorders of the skin and subcutaneous tissue: Secondary | ICD-10-CM | POA: Diagnosis not present

## 2018-09-27 DIAGNOSIS — S81802A Unspecified open wound, left lower leg, initial encounter: Secondary | ICD-10-CM

## 2018-09-27 MED ORDER — SILVER SULFADIAZINE 1 % EX CREA: TOPICAL_CREAM | CUTANEOUS | 0 refills | Status: DC

## 2018-09-27 NOTE — Progress Notes (Addendum)
Subjective:  Patient ID: Christian Daft., male    DOB: 1930/12/16,  MRN: [id number]  Chief Complaint  Patient presents with  . Diabetic Ulcer    left lower leg   84 y.o. male presents for wound care.  States that the right great toe is healing very well.  New injury to left lower leg that occurred 09/11/18 after hitting it on a tree chopping wood.  Review of Systems: Negative except as noted in the HPI. Denies N/V/F/Ch.  Past Medical History:  Diagnosis Date  . AAA (abdominal aortic aneurysm) ([place])   . Arthritis   . BPH (benign prostatic hyperplasia)   . Complication of anesthesia    "a tough time"  -pt unable to explain  . Glucose intolerance (impaired glucose tolerance)   . Hypertension   . Hypothyroid   . Pancreatitis 15-20 yrs ago  . Peripheral neuropathy    fingers and toes  . Renal insufficiency     Current Outpatient Medications:  .  calcium-vitamin D 250-100 MG-UNIT tablet, Take 1 tablet by mouth 2 (two) times daily., Disp: , Rfl:  .  cholecalciferol (VITAMIN D) 1000 UNITS tablet, Take 2,000 Units by mouth daily., Disp: , Rfl:  .  doxycycline (VIBRA-TABS) 100 MG tablet, Take 1 tablet (100 mg total) by mouth 2 (two) times daily., Disp: 20 tablet, Rfl: 0 .  levothyroxine (SYNTHROID, LEVOTHROID) 88 MCG tablet, TAKE 1 TABLET EVERY MORNING, Disp: 90 tablet, Rfl: 0 .  meropenem (MERREM) 1 g injection, , Disp: , Rfl:  .  metoprolol tartrate (LOPRESSOR) 25 MG tablet, , Disp: , Rfl: 5 .  silver sulfADIAZINE (SILVADENE) 1 % cream, Apply pea-sized amount to wound daily., Disp: 50 g, Rfl: 0 .  Vitamin D, Ergocalciferol, (DRISDOL) 50000 UNITS CAPS capsule, TAKE 1 CAPSULE EVERY 7 DAYS, Disp: 12 capsule, Rfl: 3 .  Vitamin D, Ergocalciferol, (DRISDOL) 50000 UNITS CAPS capsule, Take 50,000 Units by mouth every 7 (seven) days. On Sunday, Disp: , Rfl:   Social History   Tobacco Use  Smoking Status Former Smoker  . Packs/day: 0.30  . Years: 60.00  . Pack years: 18.00  . Last  attempt to quit: 10/02/2012  . Years since quitting: 6.0  Smokeless Tobacco Never Used    Allergies  Allergen Reactions  . Aspirin Other (See Comments)    Low bp, stomach pains  . Shellfish Allergy    Objective:  There were no vitals filed for this visit. There is no height or weight on file to calculate BMI. Constitutional Well developed. Well nourished.  Vascular Dorsalis pedis pulses palpable bilaterally. Posterior tibial pulses palpable bilaterally. Capillary refill normal to all digits.  No cyanosis or clubbing noted. Pedal hair growth normal.  Neurologic Normal speech. Oriented to person, place, and time. Protective sensation absent  Dermatologic Wound Location: L heel Wound Base: Granular/Healthy Peri-wound: Calloused Exudate: None: wound tissue dry Wound Measurements: -1x1 post-debridement  2 x 2 open ulceration left leg R Great Toe ulcer healed  Orthopedic: No pain to palpation either foot.   Radiographs: None today Assessment:   1. Ulcer of great toe, right, with unspecified severity ([place])   2. Skin ulcer of left heel, limited to breakdown of skin ([place])   3. Open wound of left lower leg, initial encounter   4. Other granulomatous disorders of the skin and subcutaneous tissue    Plan:  Patient was evaluated and treated and all questions answered.  Right great toe ulceration -Ulceration healed  Ulcer L Heel -  Debridement as below. -Dressed with medihoney, DSD. -Continue off-loading with surgical shoe. -Silvadene refilled.  Procedure: Excisional Debridement of Wound Rationale: Removal of non-viable soft tissue from the wound to promote healing.  Anesthesia: none Pre-Debridement Wound Measurements: overlying hyperkeratosis  Post-Debridement Wound Measurements: 1x1 superficial Type of Debridement: Sharp Excisional Tissue Removed: Non-viable soft tissue Depth of Debridement: subcutaneous tissue. Technique: Sharp excisional debridement to bleeding,  viable wound base.  Dressing: Dry, sterile, compression dressing. Disposition: Patient tolerated procedure well. Patient to return in 1 week for follow-up.  Open Wound Left Leg -Debrided, cauterized with silver nitrate  Procedure: Chemical Cauterization of Granulation Tissue Rationale: Cauterize granular wound base to promote healing.  Wound Measurements: 2 cm x 2 cm x 0.1 cm  Instrumentation: Silver nitrate stick x3 Dressing: Dry, sterile, compression dressing. Disposition: Patient tolerated procedure well. Patient to return in 1 week for follow-up.  Return in about 2 weeks (around 10/11/2018) for Wound Care, Left.

## 2018-10-10 ENCOUNTER — Telehealth: Payer: Self-pay | Admitting: *Deleted

## 2018-10-10 NOTE — Telephone Encounter (Signed)
Received Lab Report results [facility];; forwarded to provider/SLS 11/20

## 2018-10-11 ENCOUNTER — Ambulatory Visit: Payer: Medicare HMO | Admitting: Podiatry

## 2018-11-15 ENCOUNTER — Telehealth: Payer: Self-pay | Admitting: Internal Medicine

## 2018-11-15 NOTE — Telephone Encounter (Signed)
Received a phone call from Dr. Zigmund Daniel, she works from [employer]. Had a home visit today, the patient reported dizziness, orthostatic vitals were positive. Patient is self holding metoprolol for 1 week, heart rate in the 50s. Dr. Zigmund Daniel recommend to hold furosemide They will check on him tomorrow and see how he is doing, she is considering give him an IV saline versus observation versus ER visit. She asked [place] to see him next week. Please set up an appointment for 11/19/2018 (Monday) with PCP or somebody at this office. Jasmine, please call the patient's son [name] to set that up.  [phone]

## 2018-11-16 NOTE — Telephone Encounter (Signed)
Contacted kevin. Appt schded for Monday 11/19/18

## 2018-11-19 ENCOUNTER — Emergency Department (HOSPITAL_BASED_OUTPATIENT_CLINIC_OR_DEPARTMENT_OTHER): Payer: Medicare HMO

## 2018-11-19 ENCOUNTER — Encounter: Payer: Self-pay | Admitting: Medical

## 2018-11-19 ENCOUNTER — Encounter (HOSPITAL_BASED_OUTPATIENT_CLINIC_OR_DEPARTMENT_OTHER): Payer: Self-pay | Admitting: *Deleted

## 2018-11-19 ENCOUNTER — Emergency Department (HOSPITAL_BASED_OUTPATIENT_CLINIC_OR_DEPARTMENT_OTHER)
Admission: EM | Admit: 2018-11-19 | Discharge: 2018-11-19 | Disposition: A | Discharge status: Home / Self Care | Payer: Medicare HMO | Attending: Emergency Medicine | Admitting: Emergency Medicine

## 2018-11-19 ENCOUNTER — Ambulatory Visit (INDEPENDENT_AMBULATORY_CARE_PROVIDER_SITE_OTHER): Payer: Medicare HMO | Admitting: Medical

## 2018-11-19 ENCOUNTER — Ambulatory Visit: Payer: Self-pay | Admitting: Medical

## 2018-11-19 ENCOUNTER — Other Ambulatory Visit: Payer: Self-pay

## 2018-11-19 VITALS — BP 88/49 | HR 76 | Temp 97.9°F | Resp 14

## 2018-11-19 DIAGNOSIS — R339 Retention of urine, unspecified: Secondary | ICD-10-CM | POA: Diagnosis not present

## 2018-11-19 DIAGNOSIS — R1909 Other intra-abdominal and pelvic swelling, mass and lump: Secondary | ICD-10-CM | POA: Insufficient documentation

## 2018-11-19 DIAGNOSIS — N401 Enlarged prostate with lower urinary tract symptoms: Secondary | ICD-10-CM | POA: Diagnosis not present

## 2018-11-19 DIAGNOSIS — R1900 Intra-abdominal and pelvic swelling, mass and lump, unspecified site: Secondary | ICD-10-CM

## 2018-11-19 DIAGNOSIS — R338 Other retention of urine: Secondary | ICD-10-CM | POA: Diagnosis not present

## 2018-11-19 DIAGNOSIS — I2510 Atherosclerotic heart disease of native coronary artery without angina pectoris: Secondary | ICD-10-CM | POA: Insufficient documentation

## 2018-11-19 DIAGNOSIS — N179 Acute kidney failure, unspecified: Secondary | ICD-10-CM | POA: Insufficient documentation

## 2018-11-19 DIAGNOSIS — N183 Chronic kidney disease, stage 3 unspecified: Secondary | ICD-10-CM

## 2018-11-19 DIAGNOSIS — Z96642 Presence of left artificial hip joint: Secondary | ICD-10-CM | POA: Diagnosis not present

## 2018-11-19 DIAGNOSIS — Z87891 Personal history of nicotine dependence: Secondary | ICD-10-CM | POA: Insufficient documentation

## 2018-11-19 DIAGNOSIS — I129 Hypertensive chronic kidney disease with stage 1 through stage 4 chronic kidney disease, or unspecified chronic kidney disease: Secondary | ICD-10-CM | POA: Insufficient documentation

## 2018-11-19 DIAGNOSIS — Z79899 Other long term (current) drug therapy: Secondary | ICD-10-CM | POA: Diagnosis not present

## 2018-11-19 DIAGNOSIS — E039 Hypothyroidism, unspecified: Secondary | ICD-10-CM | POA: Diagnosis not present

## 2018-11-19 DIAGNOSIS — I4891 Unspecified atrial fibrillation: Secondary | ICD-10-CM | POA: Diagnosis not present

## 2018-11-19 DIAGNOSIS — R109 Unspecified abdominal pain: Secondary | ICD-10-CM | POA: Diagnosis present

## 2018-11-19 DIAGNOSIS — D649 Anemia, unspecified: Secondary | ICD-10-CM | POA: Diagnosis not present

## 2018-11-19 LAB — URINALYSIS, ROUTINE W REFLEX MICROSCOPIC
Bilirubin Urine: NEGATIVE
Glucose, UA: NEGATIVE mg/dL
Hgb urine dipstick: NEGATIVE
Ketones, ur: NEGATIVE mg/dL
Nitrite: NEGATIVE
Protein, ur: NEGATIVE mg/dL
Specific Gravity, Urine: 1.015 (ref 1.005–1.030)
pH: 5 (ref 5.0–8.0)

## 2018-11-19 LAB — URINALYSIS, MICROSCOPIC (REFLEX)

## 2018-11-19 LAB — CBC WITH DIFFERENTIAL/PLATELET
Abs Immature Granulocytes: 0.18 10*3/uL — ABNORMAL HIGH (ref 0.00–0.07)
Basophils Absolute: 0.1 10*3/uL (ref 0.0–0.1)
Basophils Relative: 0 %
Eosinophils Absolute: 0 10*3/uL (ref 0.0–0.5)
Eosinophils Relative: 0 %
HEMATOCRIT: 27.3 % — AB (ref 39.0–52.0)
Hemoglobin: 8.6 g/dL — ABNORMAL LOW (ref 13.0–17.0)
IMMATURE GRANULOCYTES: 1 %
LYMPHS ABS: 0.9 10*3/uL (ref 0.7–4.0)
Lymphocytes Relative: 6 %
MCH: 31.6 pg (ref 26.0–34.0)
MCHC: 31.5 g/dL (ref 30.0–36.0)
MCV: 100.4 fL — AB (ref 80.0–100.0)
MONOS PCT: 10 %
Monocytes Absolute: 1.5 10*3/uL — ABNORMAL HIGH (ref 0.1–1.0)
NRBC: 0 % (ref 0.0–0.2)
Neutro Abs: 13.1 10*3/uL — ABNORMAL HIGH (ref 1.7–7.7)
Neutrophils Relative %: 83 %
Platelets: 207 10*3/uL (ref 150–400)
RBC: 2.72 MIL/uL — ABNORMAL LOW (ref 4.22–5.81)
RDW: 17.7 % — ABNORMAL HIGH (ref 11.5–15.5)
WBC: 15.7 10*3/uL — ABNORMAL HIGH (ref 4.0–10.5)

## 2018-11-19 LAB — COMPREHENSIVE METABOLIC PANEL
ALK PHOS: 70 U/L (ref 38–126)
ALT: 10 U/L (ref 0–44)
ALT: 10 U/L (ref 0–44)
AST: 18 U/L (ref 15–41)
AST: 16 U/L (ref 15–41)
Albumin: 2.6 g/dL — ABNORMAL LOW (ref 3.5–5.0)
Albumin: 2.3 g/dL — ABNORMAL LOW (ref 3.5–5.0)
Alkaline Phosphatase: 81 U/L (ref 38–126)
Anion gap: 10 (ref 5–15)
Anion gap: 8 (ref 5–15)
BILIRUBIN TOTAL: 0.8 mg/dL (ref 0.3–1.2)
BUN: 67 mg/dL — ABNORMAL HIGH (ref 8–23)
BUN: 65 mg/dL — ABNORMAL HIGH (ref 8–23)
CO2: 11 mmol/L — ABNORMAL LOW (ref 22–32)
CO2: 12 mmol/L — ABNORMAL LOW (ref 22–32)
Calcium: 5.7 mg/dL — CL (ref 8.9–10.3)
Calcium: 5.4 mg/dL — CL (ref 8.9–10.3)
Chloride: 115 mmol/L — ABNORMAL HIGH (ref 98–111)
Chloride: 116 mmol/L — ABNORMAL HIGH (ref 98–111)
Creatinine, Ser: 2.72 mg/dL — ABNORMAL HIGH (ref 0.61–1.24)
Creatinine, Ser: 2.83 mg/dL — ABNORMAL HIGH (ref 0.61–1.24)
GFR calc Af Amer: 24 mL/min — ABNORMAL LOW (ref >60)
GFR calc Af Amer: 23 mL/min — ABNORMAL LOW (ref >60)
GFR calc non Af Amer: 20 mL/min — ABNORMAL LOW (ref >60)
GFR calc non Af Amer: 19 mL/min — ABNORMAL LOW (ref >60)
Glucose, Bld: 206 mg/dL — ABNORMAL HIGH (ref 70–99)
Glucose, Bld: 238 mg/dL — ABNORMAL HIGH (ref 70–99)
Potassium: 4.4 mmol/L (ref 3.5–5.1)
Potassium: 4.1 mmol/L (ref 3.5–5.1)
Sodium: 136 mmol/L (ref 135–145)
Sodium: 136 mmol/L (ref 135–145)
Total Bilirubin: 0.6 mg/dL (ref 0.3–1.2)
Total Protein: 6.3 g/dL — ABNORMAL LOW (ref 6.5–8.1)
Total Protein: 5.7 g/dL — ABNORMAL LOW (ref 6.5–8.1)

## 2018-11-19 LAB — BASIC METABOLIC PANEL
Anion gap: 8 (ref 5–15)
BUN: 62 mg/dL — ABNORMAL HIGH (ref 8–23)
CO2: 12 mmol/L — ABNORMAL LOW (ref 22–32)
CREATININE: 2.57 mg/dL — AB (ref 0.61–1.24)
Calcium: 5.4 mg/dL — CL (ref 8.9–10.3)
Chloride: 114 mmol/L — ABNORMAL HIGH (ref 98–111)
GFR calc Af Amer: 25 mL/min — ABNORMAL LOW (ref >60)
GFR calc non Af Amer: 22 mL/min — ABNORMAL LOW (ref >60)
Glucose, Bld: 148 mg/dL — ABNORMAL HIGH (ref 70–99)
Potassium: 3.7 mmol/L (ref 3.5–5.1)
Sodium: 134 mmol/L — ABNORMAL LOW (ref 135–145)

## 2018-11-19 LAB — I-STAT CG4 LACTIC ACID, ED: Lactic Acid, Venous: 1.2 mmol/L (ref 0.5–1.9)

## 2018-11-19 LAB — LIPASE, BLOOD: Lipase: 17 U/L (ref 11–51)

## 2018-11-19 MED ORDER — CALCIUM CARBONATE ANTACID 500 MG PO CHEW
800.0000 mg | CHEWABLE_TABLET | Freq: Once | ORAL | Status: AC
  Administered 2018-11-19: 800 mg via ORAL
  Filled 2018-11-19: qty 4

## 2018-11-19 MED ORDER — SODIUM CHLORIDE 0.9 % IV BOLUS
1000.0000 mL | Freq: Once | INTRAVENOUS | Status: AC
  Administered 2018-11-19: 1000 mL via INTRAVENOUS

## 2018-11-19 NOTE — ED Notes (Signed)
Spoke with daughter in law at bedside and she stated that patient usually does in and out cath but has not been doing in and out cath himself.  His son usually does it.  One one did an in and out cath yesterday and the last time was the day before yesterday.  Bladder scan done and shows >967.  MD notified.

## 2018-11-19 NOTE — Discharge Instructions (Signed)
Drink plenty of fluids. Follow up with urologist, nephrologist (kidney doctor), and primary doctor as directed. Return to ER if any abdominal pain, fevers, confusion, vomiting, or other sudden changes.

## 2018-11-19 NOTE — Progress Notes (Signed)
Subjective:    Patient ID: Christian Daft., male    DOB: May 13, 1931, 84 y.o.   MRN: [id number]  HPI  Pt family states Home health MD advised him to stop his blood pressure. Stated to stop his metoprolol. He is feeling weak. He has history of atrial fibrillation.  He is feeling very dizzy when stands up. This has been going on for 2 weeks. But worse past week since Thursday.   Pt bp 99/49. Recent lower bp than baseline. His metoprolol was stopped but bp still low. Pt has anemia and decreased kidney function.  Pt has appointment with urologist tomorrow. Pt missed appointment.He had urosepsis in October and was hospitalized. About 2 weeks ago seen by his urologist and found to have recurrent infection.   He feels chills and has turning up heat to 80 degrees.  Severe abdomen pain today before he came in and reocurred during interview. He thought he might pass out. Though per pt report has not catheterized himself in about 24 hours.   Also describing upper abdomen pain and low back pain. Points to cva areas.     Review of Systems  Constitutional: Negative for chills, fatigue and fever.  HENT: Negative for congestion and facial swelling.   Respiratory: Negative for cough, chest tightness, shortness of breath and wheezing.   Cardiovascular: Negative for chest pain and palpitations.  Gastrointestinal: Positive for abdominal pain. Negative for abdominal distention, constipation and diarrhea.  Endocrine: Negative for polydipsia and polyuria.  Genitourinary: Positive for difficulty urinating. Negative for flank pain and frequency.  Musculoskeletal: Positive for back pain.  Neurological: Positive for weakness. Negative for facial asymmetry, speech difficulty and light-headedness.       Standing feels like will pass out.  Hematological: Negative for adenopathy. Does not bruise/bleed easily.  Psychiatric/Behavioral: Negative for behavioral problems and confusion.    Past Medical History:    Diagnosis Date  . AAA (abdominal aortic aneurysm) ([place])   . Arthritis   . BPH (benign prostatic hyperplasia)   . Complication of anesthesia    "a tough time"  -pt unable to explain  . Glucose intolerance (impaired glucose tolerance)   . Hypertension   . Hypothyroid   . Pancreatitis 15-20 yrs ago  . Peripheral neuropathy    fingers and toes  . Renal insufficiency      Social History   Socioeconomic History  . Marital status: Single    Spouse name: Not on file  . Number of children: Not on file  . Years of education: Not on file  . Highest education level: Not on file  Occupational History  . Not on file  Social Needs  . Financial resource strain: Not on file  . Food insecurity:    Worry: Not on file    Inability: Not on file  . Transportation needs:    Medical: Not on file    Non-medical: Not on file  Tobacco Use  . Smoking status: Former Smoker    Packs/day: 0.30    Years: 60.00    Pack years: 18.00    Last attempt to quit: 10/02/2012    Years since quitting: 6.1  . Smokeless tobacco: Never Used  Substance and Sexual Activity  . Alcohol use: Yes    Alcohol/week: 0.0 standard drinks    Comment: occasioally  . Drug use: No  . Sexual activity: Not on file  Lifestyle  . Physical activity:    Days per week: Not on file  Minutes per session: Not on file  . Stress: Not on file  Relationships  . Social connections:    Talks on phone: Not on file    Gets together: Not on file    Attends religious service: Not on file    Active member of club or organization: Not on file    Attends meetings of clubs or organizations: Not on file    Relationship status: Not on file  . Intimate partner violence:    Fear of current or ex partner: Not on file    Emotionally abused: Not on file    Physically abused: Not on file    Forced sexual activity: Not on file  Other Topics Concern  . Not on file  Social History Narrative  . Not on file    Past Surgical History:   Procedure Laterality Date  . ABDOMINAL AORTAGRAM N/A 05/06/2014   Procedure: ABDOMINAL Maxcine Ham;  Surgeon: Serafina Mitchell, MD;  Location: [facility] CATH LAB;  Service: Cardiovascular;  Laterality: N/A;  . ABDOMINAL AORTIC ANEURYSM REPAIR  july 2011  . AMPUTATION Right 01/03/2013   Procedure: AMPUTATION DIGIT;  Surgeon: Wylene Simmer, MD;  Location: [facility];  Service: Orthopedics;  Laterality: Right;  RIGHT 2ND TOE AMPUTATION  . HIP PINNING,CANNULATED Left 08/08/2013   Procedure: LEFT CANNULATED HIP PINNING;  Surgeon: Rozanna Box, MD;  Location: [place];  Service: Orthopedics;  Laterality: Left;  . PROSTATE SURGERY  5 yrs ago   urethral dilation  . STERIOD INJECTION Bilateral 08/08/2013   Procedure: STEROID INJECTION;  Surgeon: Rozanna Box, MD;  Location: [place];  Service: Orthopedics;  Laterality: Bilateral;  . TOTAL HIP REVISION Left 02/10/2014   Procedure: REMOVAL OF SYNTHESE SCREWS LEFT HIP AND CONVERSION LEFT TOTAL HIP ARTHROPLASTY;  Surgeon: Mauri Pole, MD;  Location: [facility];  Service: Orthopedics;  Laterality: Left;    Family History  Problem Relation Age of Onset  . Heart attack Mother   . Heart disease Mother   . Heart attack Father   . Diabetes Son        amputation-BKA     Allergies  Allergen Reactions  . Aspirin Other (See Comments)    Low bp, stomach pains  . Shellfish Allergy     Current Outpatient Medications on File Prior to Visit  Medication Sig Dispense Refill  . calcium-vitamin D 250-100 MG-UNIT tablet Take 1 tablet by mouth 2 (two) times daily.    . cholecalciferol (VITAMIN D) 1000 UNITS tablet Take 2,000 Units by mouth daily.    Marland Kitchen doxycycline (VIBRA-TABS) 100 MG tablet Take 1 tablet (100 mg total) by mouth 2 (two) times daily. 20 tablet 0  . levothyroxine (SYNTHROID, LEVOTHROID) 88 MCG tablet TAKE 1 TABLET EVERY MORNING 90 tablet 0  . meropenem (MERREM) 1 g injection     . metoprolol tartrate (LOPRESSOR) 25 MG tablet   5  . silver sulfADIAZINE (SILVADENE) 1 %  cream Apply pea-sized amount to wound daily. 50 g 0  . Vitamin D, Ergocalciferol, (DRISDOL) 50000 UNITS CAPS capsule TAKE 1 CAPSULE EVERY 7 DAYS 12 capsule 3  . Vitamin D, Ergocalciferol, (DRISDOL) 50000 UNITS CAPS capsule Take 50,000 Units by mouth every 7 (seven) days. On Sunday     No current facility-administered medications on file prior to visit.     BP (!) 88/49   Pulse 76   Temp 97.9 F (36.6 C) (Oral)   Resp 14   SpO2 (!) 89%  Objective:   Physical Exam  General Mental Status- Alert. General Appearance- Not in acute distress.   Skin General: Color- Normal Color. Moisture- Normal Moisture.  Neck Carotid Arteries- Normal color. Moisture- Normal Moisture. No carotid bruits. No JVD.  Chest and Lung Exam Auscultation: Breath Sounds:-Normal.  Cardiovascular Auscultation:Rythm- Regular. Murmurs & Other Heart Sounds:Auscultation of the heart reveals- No Murmurs.  Abdomen Inspection:-Inspeection Normal. Palpation/Percussion:Note:No mass. Palpation and Percussion of the abdomen reveal- Non Tender, Non Distended + BS, no rebound or guarding.    Neurologic Cranial Nerve exam:- CN III-XII intact(No nystagmus), symmetric smile. Strength:- 5/5 equal and symmetric strength both upper and lower extremities.     Assessment & Plan:  For years very low blood pressure, low oxygen percentage and intermittent severe abdomen and back pain, I do think it is best for you to be evaluated in the emergency department. (appropiate work up to evaluate differential dx)  You have history of a urinary retention and urosepsis.  They will probably place a catheter and recheck your urine.  I did talk with the emergency department physician and notified him of your medical history and recent presentation.  Follow-up date to be determined after emergency department evaluation. Mackie Pai, PA-C

## 2018-11-19 NOTE — ED Notes (Signed)
Patient

## 2018-11-19 NOTE — Patient Instructions (Addendum)
For years very low blood pressure, low oxygen percentage and intermittent severe abdomen and back pain, I do think it is best for you to be evaluated in the emergency department.(appropiate work up to evaluate differential dx)  You have history of a urinary retention and urosepsis.  They will probably place a catheter and recheck your urine.  I did talk with the emergency department physician and notified him of your medical history and recent presentation.  Follow-up date to be determined after emergency department evaluation.

## 2018-11-19 NOTE — ED Notes (Signed)
Date and time results received: 11/19/18 5:49 PM   Test:Calcium Critical Value:5.4  Name of Provider Notified: Little  Orders Received? Or Actions Taken?: EDP made aware

## 2018-11-19 NOTE — ED Triage Notes (Signed)
He was seen by his MD today for abdominal pain and dizziness. His BP was low and he was told to come here for treatment.

## 2018-11-19 NOTE — ED Notes (Signed)
Date and time results received: 11/19/18 5:05 PM   Test: Calcium Critical Value: 5.7  Name of Provider Notified: Little  Orders Received? Or Actions Taken?: Little notified

## 2018-11-19 NOTE — ED Notes (Signed)
Eating meal son brought in. Update provided on plan of care; patient and family verbalized understanding.

## 2018-11-19 NOTE — ED Notes (Signed)
Foley cath changed to a leg bag and overnight bag given to family; instructed on when and how to change and empty both bags; patient's son verbalized understanding.

## 2018-11-19 NOTE — ED Notes (Signed)
ED Provider at bedside. 

## 2018-11-19 NOTE — ED Provider Notes (Signed)
[facility] Provider Note   CSN: [id number] Arrival date & time: 11/19/18  1535     History   Chief Complaint Chief Complaint  Patient presents with  . Hypotension    HPI [name]. is a 84 y.o. male.  84yo M w/ PMH including AAA repair, BPH requiring I&O self cath, HTN, CKD, hearing loss who p/w abdominal pain and malaise.  Patient is hard of hearing and a difficult historian therefore history primarily obtained from his daughter in law.  She states that for the past week, he has not felt well and has been laying around at home, not wanting to get up and do things.  He has intermittently complained of feeling dizzy.  He normally self catheterizes but his vision is so poor that recently he has been having difficulty doing this by himself. His last cath was over 24 hours ago. Today he went to PCP and was complaining of lower abdominal pain, which is something that daughter in law states he has complained of intermittently. Pain is worse when he sits up straight. He denies vomiting, diarrhea, or fevers. No cough/cold symptoms. He recently completed a 10-day course of abx for UTI and has urology appt tomorrow. Currently he denies pain.  The history is provided by the patient and a relative.    Past Medical History:  Diagnosis Date  . AAA (abdominal aortic aneurysm) ([place])   . Arthritis   . BPH (benign prostatic hyperplasia)   . Complication of anesthesia    "a tough time"  -pt unable to explain  . Glucose intolerance (impaired glucose tolerance)   . Hypertension   . Hypothyroid   . Pancreatitis 15-20 yrs ago  . Peripheral neuropathy    fingers and toes  . Renal insufficiency     Patient Active Problem List   Diagnosis Date Noted  . Onychomycosis 02/27/2015  . Pain in lower limb 02/27/2015  . Vitamin D deficiency 11/06/2014  . Hypocalcemia 09/26/2014  . Peripheral neuropathy 09/26/2014  . Thrombocytopenia ([place]) 09/26/2014  . Macular  degeneration 09/25/2014  . 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 S/P left hip revision 02/10/2014  . Abdominal aortic aneurysm ([place]) 09/30/2013  . Anemia of chronic disease 08/07/2013  . Borderline diabetes mellitus 01/02/2013  . CKD (chronic kidney disease), stage III ([place]) 01/02/2013  . Hypothyroid 10/17/2011  . Smokers' cough ([place]) 10/17/2011  . 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 BPH (benign prostatic hyperplasia) 10/17/2011    Past Surgical History:  Procedure Laterality Date  . ABDOMINAL AORTAGRAM N/A 05/06/2014   Procedure: ABDOMINAL Maxcine Ham;  Surgeon: Serafina Mitchell, MD;  Location: [facility] CATH LAB;  Service: Cardiovascular;  Laterality: N/A;  . ABDOMINAL AORTIC ANEURYSM REPAIR  july 2011  . AMPUTATION Right 01/03/2013   Procedure: AMPUTATION DIGIT;  Surgeon: Wylene Simmer, MD;  Location: [facility];  Service: Orthopedics;  Laterality: Right;  RIGHT 2ND TOE AMPUTATION  . HIP PINNING,CANNULATED Left 08/08/2013   Procedure: LEFT CANNULATED HIP PINNING;  Surgeon: Rozanna Box, MD;  Location: [place];  Service: Orthopedics;  Laterality: Left;  . PROSTATE SURGERY  5 yrs ago   urethral dilation  . STERIOD INJECTION Bilateral 08/08/2013   Procedure: STEROID INJECTION;  Surgeon: Rozanna Box, MD;  Location: [place];  Service: Orthopedics;  Laterality: Bilateral;  . TOTAL HIP REVISION Left 02/10/2014   Procedure: REMOVAL OF SYNTHESE SCREWS LEFT HIP AND CONVERSION LEFT TOTAL HIP ARTHROPLASTY;  Surgeon: Rodman Key  Marian Sorrow, MD;  Location: [facility];  Service: Orthopedics;  Laterality: Left;        Home Medications    Prior to Admission medications   Medication Sig Start Date End Date Taking? Authorizing Provider  calcium-vitamin D 250-100 MG-UNIT tablet Take 1 tablet by mouth 2 (two) times daily.    [provider]  cholecalciferol (VITAMIN D) 1000 UNITS tablet Take 2,000 Units by mouth daily.    [provider]  doxycycline  (VIBRA-TABS) 100 MG tablet Take 1 tablet (100 mg total) by mouth 2 (two) times daily. 08/22/18   Saguier, Percell Miller, PA-C  levothyroxine (SYNTHROID, LEVOTHROID) 88 MCG tablet TAKE 1 TABLET EVERY MORNING 01/13/16   Denita Lung, MD  [facility]) 1 g injection  08/08/18   [provider]  metoprolol tartrate (LOPRESSOR) 25 MG tablet  08/08/18   [provider]  silver sulfADIAZINE (SILVADENE) 1 % cream Apply pea-sized amount to wound daily. 09/27/18   Evelina Bucy, DPM  Vitamin D, Ergocalciferol, (DRISDOL) 50000 UNITS CAPS capsule TAKE 1 CAPSULE EVERY 7 DAYS 06/17/15   Denita Lung, MD  Vitamin D, Ergocalciferol, (DRISDOL) 50000 UNITS CAPS capsule Take 50,000 Units by mouth every 7 (seven) days. On Sunday    [provider]    Family History Family History  Problem Relation Age of Onset  . Heart attack Mother   . Heart disease Mother   . Heart attack Father   . Diabetes Son        amputation-BKA     Social History Social History   Tobacco Use  . Smoking status: Former Smoker    Packs/day: 0.30    Years: 60.00    Pack years: 18.00    Last attempt to quit: 10/02/2012    Years since quitting: 6.1  . Smokeless tobacco: Never Used  Substance Use Topics  . Alcohol use: Yes    Alcohol/week: 0.0 standard drinks    Comment: occasioally  . Drug use: No     Allergies   Aspirin and Shellfish allergy   Review of Systems Review of Systems  Unable to perform ROS: Other  hearing loss   Physical Exam Updated Vital Signs BP 100/74   Pulse 80   Temp 97.7 F (36.5 C) (Oral)   Resp 17   Ht 5' 8.5" (1.74 m)   Wt 75.2 kg   SpO2 94%   BMI 24.84 kg/m   Physical Exam Vitals signs and nursing note reviewed.  Constitutional:      General: He is not in acute distress.    Appearance: He is well-developed.     Comments: Frail, chronically ill-appearing  HENT:     Head: Normocephalic and atraumatic.  Eyes:     Conjunctiva/sclera: Conjunctivae  normal.  Neck:     Musculoskeletal: Neck supple.  Cardiovascular:     Rate and Rhythm: Normal rate and regular rhythm.     Heart sounds: Normal heart sounds. No murmur.  Pulmonary:     Effort: Pulmonary effort is normal.     Breath sounds: Normal breath sounds.  Abdominal:     General: Bowel sounds are normal. There is distension (Mild lower abdominal distention).     Palpations: Abdomen is soft.     Tenderness: There is no abdominal tenderness.  Skin:    General: Skin is warm and dry.     Comments: Chronic Venous stasis dermatitis bilateral lower legs  Neurological:     Mental Status: He is alert  and oriented to person, place, and time.     Comments: Fluent speech Hard of hearing      ED Treatments / Results  Labs (all labs ordered are listed, but only abnormal results are displayed) Labs Reviewed  COMPREHENSIVE METABOLIC PANEL - Abnormal; Notable for the following components:      Result Value   Chloride 115 (*)    CO2 11 (*)    Glucose, Bld 206 (*)    BUN 67 (*)    Creatinine, Ser 2.72 (*)    Calcium 5.7 (*)    Total Protein 6.3 (*)    Albumin 2.6 (*)    GFR calc non Af Amer 20 (*)    GFR calc Af Amer 24 (*)    All other components within normal limits  CBC WITH DIFFERENTIAL/PLATELET - Abnormal; Notable for the following components:   WBC 15.7 (*)    RBC 2.72 (*)    Hemoglobin 8.6 (*)    HCT 27.3 (*)    MCV 100.4 (*)    RDW 17.7 (*)    Neutro Abs 13.1 (*)    Monocytes Absolute 1.5 (*)    Abs Immature Granulocytes 0.18 (*)    All other components within normal limits  URINALYSIS, ROUTINE W REFLEX MICROSCOPIC - Abnormal; Notable for the following components:   APPearance CLOUDY (*)    Leukocytes, UA TRACE (*)    All other components within normal limits  URINALYSIS, MICROSCOPIC (REFLEX) - Abnormal; Notable for the following components:   Bacteria, UA FEW (*)    All other components within normal limits  COMPREHENSIVE METABOLIC PANEL - Abnormal; Notable for  the following components:   Chloride 116 (*)    CO2 12 (*)    Glucose, Bld 238 (*)    BUN 65 (*)    Creatinine, Ser 2.83 (*)    Calcium 5.4 (*)    Total Protein 5.7 (*)    Albumin 2.3 (*)    GFR calc non Af Amer 19 (*)    GFR calc Af Amer 23 (*)    All other components within normal limits  BASIC METABOLIC PANEL - Abnormal; Notable for the following components:   Sodium 134 (*)    Chloride 114 (*)    CO2 12 (*)    Glucose, Bld 148 (*)    BUN 62 (*)    Creatinine, Ser 2.57 (*)    Calcium 5.4 (*)    GFR calc non Af Amer 22 (*)    GFR calc Af Amer 25 (*)    All other components within normal limits  URINE CULTURE  LIPASE, BLOOD  I-STAT CG4 LACTIC ACID, ED  I-STAT CG4 LACTIC ACID, ED    EKG EKG Interpretation  Date/Time:  Monday November 19 2018 16:03:02 EST Ventricular Rate:  113 PR Interval:    QRS Duration: 112 QT Interval:  380 QTC Calculation: 481 R Axis:   -55 Text Interpretation:  Atrial fibrillation Ventricular tachycardia, unsustained Left anterior fascicular block Low voltage, extremity and precordial leads Borderline prolonged QT interval frequent PVCs Confirmed by Theotis Burrow [phone]) on 11/19/2018 4:06:49 PM   Radiology Ct Abdomen Pelvis Wo Contrast  Result Date: 11/19/2018 CLINICAL DATA:  Abdominal pain.  Hypotension. EXAM: CT ABDOMEN AND PELVIS WITHOUT CONTRAST TECHNIQUE: Multidetector CT imaging of the abdomen and pelvis was performed following the standard protocol without IV contrast. COMPARISON:  07/27/2018 CT abdomen/pelvis. FINDINGS: Lower chest: Small dependent bilateral pleural effusions. Nonspecific patchy subpleural reticulation throughout both lung  bases. Coronary atherosclerosis. Hepatobiliary: Normal liver size. Simple 2.6 cm left liver lobe cyst. No additional liver lesions. Normal gallbladder with no radiopaque cholelithiasis. No biliary ductal dilatation. Pancreas: There is a cystic 7.1 x 5.9 cm mass centered at the pancreatic head (series  2/image 33), which contains a few tiny internal foci of gas, not substantially changed since 10/22/2015 CT and present since 04/17/2007 CT, suspect a chronic pancreatic pseudocyst. Otherwise diffuse pancreatic parenchymal atrophy with no new pancreatic lesions and no pancreatic duct dilation, unchanged in the interval. Spleen: Normal size. No mass. Adrenals/Urinary Tract: Normal adrenals. There is a mixed attenuation 6.7 x 4.6 cm medial upper left retroperitoneal mass (series 2/image 29), which abuts the medial upper left kidney, and which is new since 07/27/2018 CT, and which contains scattered internal regions of macroscopic fat density. This mass is contiguous with the upper left psoas muscle. No hydronephrosis. No renal stones. Numerous simple and minimally complex renal cysts in both kidneys, the largest an exophytic 6.8 cm simple renal cyst in posterior lower left kidney. Bladder collapsed by indwelling Foley catheter. Bladder poorly evaluated due to streak artifact from left hip hardware. Stomach/Bowel: Nondistended stomach without definite acute gastric abnormality. Normal caliber small bowel with no small bowel wall thickening. Normal appendix. Normal large bowel with no diverticulosis, large bowel wall thickening or pericolonic fat stranding. Vascular/Lymphatic: Atherosclerotic nonaneurysmal abdominal aorta status post surgical graft repair. No pathologically enlarged lymph nodes in the abdomen or pelvis. Reproductive: Normal size prostate. Other: No pneumoperitoneum, ascites or focal fluid collection. Musculoskeletal: No aggressive appearing focal osseous lesions. Left total hip arthroplasty. Mild-to-moderate thoracolumbar spondylosis. IMPRESSION: 1. Mixed attenuation 6.7 cm medial upper left retroperitoneal mass between the upper left kidney and upper left psoas muscle with areas of internal fat density, new since 07/27/2018 CT and suspicious for malignancy, with the differential including  retroperitoneal liposarcoma versus exophytic renal cell carcinoma. MRI abdomen without and with IV contrast may be useful for further characterization. This mass may be amenable to percutaneous biopsy. 2. No evidence of bowel obstruction or acute bowel inflammation. 3. Small dependent bilateral pleural effusions. 4. Chronic 7.1 cm cystic pancreatic head mass, unchanged since 2016 CT, present since 2008 CT, suspect chronic pancreatic pseudocyst. 5.  Aortic Atherosclerosis (ICD10-I70.0). Electronically Signed   By: Ilona Sorrel M.D.   On: 11/19/2018 20:24    Procedures Procedures (including critical care time)  Medications Ordered in ED Medications  sodium chloride 0.9 % bolus 1,000 mL (0 mLs Intravenous Stopped 11/19/18 1845)  calcium carbonate (TUMS - dosed in mg elemental calcium) chewable tablet 800 mg of elemental calcium (800 mg of elemental calcium Oral Given 11/19/18 1732)     Initial Impression / Assessment and Plan / ED Course  I have reviewed the triage vital signs and the nursing notes.  Pertinent labs & imaging results that were available during my care of the patient were reviewed by me and considered in my medical decision making (see chart for details).     Pt w/ acute urinary retention on exam, >730ml output with placement of foley.  I recommended leaving Foley in as I am concerned that the patient has been unable to perform in and out catheterization for himself at home.  He had mild distention in the lower abdomen on exam.  Lab work shows creatinine of 2.8 which has increased from baseline around 1.7.  I suspect this is due to obstruction.  Gave fluid bolus. His measured calcium was 5.4 however albumin is 2.3 and  corrected calcium is 6.76 which is consistent with previous values.  EKG without prolonged QT. WBC 15.7. Obtained CT abd/pelvis which showed no acute pathology however he does have a concerning retroperitoneal mass and I have discussed this finding with his son.  I  explained the need for follow-up MRI imaging through either his urologist or his PCP.  After receiving fluids and taking p.o. in the ED, repeat BMP shows improvement in creatinine to 2.57.  I anticipate that this will continue to improve now that we have relieved his urinary obstruction.  He has a follow-up appointment with his urologist tomorrow morning and I have instructed son to discuss long-term plan regarding indwelling Foley or other option instead of self-catheterization.  He also has an appointment this week with his nephrologist where I have instructed son to inquire about his persistent hypocalcemia and have his creatinine rechecked.  Son will also touch base with PCP if any difficulty arranging further work-up of retroperitoneal mass.  I have discussed supportive measures at home including aggressive hydration and I have extensively reviewed return precautions with son.  Patient's vital signs reassuring on repeat assessment and he has been eating and drinking here with no problems.  Discharged in satisfactory condition. Final Clinical Impressions(s) / ED Diagnoses   Final diagnoses:  AKI (acute kidney injury) [facility])  Urinary retention due to benign prostatic hyperplasia  Hypocalcemia  Retroperitoneal mass    ED Discharge Orders    None       Little, Wenda Overland, MD 11/19/18 [phone]

## 2018-11-22 ENCOUNTER — Telehealth: Payer: Self-pay | Admitting: *Deleted

## 2018-11-22 LAB — URINE CULTURE: SPECIAL REQUESTS: NORMAL

## 2018-11-22 NOTE — Telephone Encounter (Signed)
Received request for Medical Records from [facility] for last [2] OV notes and recent labs, patient signed medical release; forwarded via fax/SLS 01/02  Received request for Medical Records from [facility] for [facility], other providers, and past year of records; forwarded to Medical Records via email/scan/SLS

## 2018-11-23 ENCOUNTER — Telehealth: Payer: Self-pay | Admitting: Emergency Medicine

## 2018-11-23 NOTE — Telephone Encounter (Signed)
Post ED Visit - Positive Culture Follow-up  Culture report reviewed by antimicrobial stewardship pharmacist:  []  Elenor Quinones, Pharm.D. []  Heide Guile, Pharm.D., BCPS AQ-ID []  Parks Neptune, Pharm.D., BCPS []  Alycia Rossetti, Pharm.D., BCPS []  [place], Pharm.D., BCPS, AAHIVP []  Legrand Como, Pharm.D., BCPS, AAHIVP []  Salome Arnt, PharmD, BCPS []  Johnnette Gourd, PharmD, BCPS []  Hughes Better, PharmD, BCPS []  Leeroy Cha, PharmD Harrietta Guardian PharmD  Positive urine culture Treated with none, asymptomatic, no further patient follow-up is required at this time.  Hazle Nordmann 11/23/2018, 9:43 AM

## 2018-11-26 ENCOUNTER — Emergency Department (HOSPITAL_BASED_OUTPATIENT_CLINIC_OR_DEPARTMENT_OTHER): Payer: Medicare HMO

## 2018-11-26 ENCOUNTER — Encounter (HOSPITAL_BASED_OUTPATIENT_CLINIC_OR_DEPARTMENT_OTHER): Payer: Self-pay | Admitting: *Deleted

## 2018-11-26 ENCOUNTER — Observation Stay (HOSPITAL_COMMUNITY): Payer: Medicare HMO

## 2018-11-26 ENCOUNTER — Inpatient Hospital Stay (HOSPITAL_BASED_OUTPATIENT_CLINIC_OR_DEPARTMENT_OTHER)
Admission: EM | Admit: 2018-11-26 | Discharge: 2018-12-10 | DRG: 981 | Disposition: A | Discharge status: Hospice | Payer: Medicare HMO | Attending: Internal Medicine | Admitting: Internal Medicine

## 2018-11-26 ENCOUNTER — Other Ambulatory Visit: Payer: Self-pay

## 2018-11-26 DIAGNOSIS — Z886 Allergy status to analgesic agent status: Secondary | ICD-10-CM

## 2018-11-26 DIAGNOSIS — A4152 Sepsis due to Pseudomonas: Secondary | ICD-10-CM | POA: Diagnosis not present

## 2018-11-26 DIAGNOSIS — Z7189 Other specified counseling: Secondary | ICD-10-CM

## 2018-11-26 DIAGNOSIS — D631 Anemia in chronic kidney disease: Secondary | ICD-10-CM | POA: Diagnosis not present

## 2018-11-26 DIAGNOSIS — L8990 Pressure ulcer of unspecified site, unspecified stage: Secondary | ICD-10-CM

## 2018-11-26 DIAGNOSIS — N390 Urinary tract infection, site not specified: Secondary | ICD-10-CM | POA: Diagnosis present

## 2018-11-26 DIAGNOSIS — Z515 Encounter for palliative care: Secondary | ICD-10-CM | POA: Diagnosis not present

## 2018-11-26 DIAGNOSIS — G629 Polyneuropathy, unspecified: Secondary | ICD-10-CM | POA: Diagnosis present

## 2018-11-26 DIAGNOSIS — Z91013 Allergy to seafood: Secondary | ICD-10-CM

## 2018-11-26 DIAGNOSIS — E039 Hypothyroidism, unspecified: Secondary | ICD-10-CM | POA: Diagnosis not present

## 2018-11-26 DIAGNOSIS — Z8679 Personal history of other diseases of the circulatory system: Secondary | ICD-10-CM

## 2018-11-26 DIAGNOSIS — I313 Pericardial effusion (noninflammatory): Secondary | ICD-10-CM | POA: Diagnosis not present

## 2018-11-26 DIAGNOSIS — R1900 Intra-abdominal and pelvic swelling, mass and lump, unspecified site: Secondary | ICD-10-CM

## 2018-11-26 DIAGNOSIS — N183 Chronic kidney disease, stage 3 unspecified: Secondary | ICD-10-CM | POA: Diagnosis present

## 2018-11-26 DIAGNOSIS — R109 Unspecified abdominal pain: Secondary | ICD-10-CM | POA: Diagnosis present

## 2018-11-26 DIAGNOSIS — H9190 Unspecified hearing loss, unspecified ear: Secondary | ICD-10-CM | POA: Diagnosis present

## 2018-11-26 DIAGNOSIS — M1990 Unspecified osteoarthritis, unspecified site: Secondary | ICD-10-CM | POA: Diagnosis present

## 2018-11-26 DIAGNOSIS — M47816 Spondylosis without myelopathy or radiculopathy, lumbar region: Secondary | ICD-10-CM | POA: Diagnosis present

## 2018-11-26 DIAGNOSIS — R7989 Other specified abnormal findings of blood chemistry: Secondary | ICD-10-CM

## 2018-11-26 DIAGNOSIS — I482 Chronic atrial fibrillation, unspecified: Secondary | ICD-10-CM | POA: Diagnosis not present

## 2018-11-26 DIAGNOSIS — N401 Enlarged prostate with lower urinary tract symptoms: Secondary | ICD-10-CM | POA: Diagnosis present

## 2018-11-26 DIAGNOSIS — A498 Other bacterial infections of unspecified site: Secondary | ICD-10-CM

## 2018-11-26 DIAGNOSIS — D509 Iron deficiency anemia, unspecified: Secondary | ICD-10-CM | POA: Diagnosis present

## 2018-11-26 DIAGNOSIS — H3530 Unspecified macular degeneration: Secondary | ICD-10-CM | POA: Diagnosis present

## 2018-11-26 DIAGNOSIS — E559 Vitamin D deficiency, unspecified: Secondary | ICD-10-CM | POA: Diagnosis present

## 2018-11-26 DIAGNOSIS — T83511S Infection and inflammatory reaction due to indwelling urethral catheter, sequela: Secondary | ICD-10-CM

## 2018-11-26 DIAGNOSIS — Z79890 Hormone replacement therapy (postmenopausal): Secondary | ICD-10-CM

## 2018-11-26 DIAGNOSIS — M5134 Other intervertebral disc degeneration, thoracic region: Secondary | ICD-10-CM | POA: Diagnosis present

## 2018-11-26 DIAGNOSIS — I5023 Acute on chronic systolic (congestive) heart failure: Secondary | ICD-10-CM | POA: Diagnosis present

## 2018-11-26 DIAGNOSIS — Z66 Do not resuscitate: Secondary | ICD-10-CM | POA: Diagnosis not present

## 2018-11-26 DIAGNOSIS — M6289 Other specified disorders of muscle: Secondary | ICD-10-CM

## 2018-11-26 DIAGNOSIS — R799 Abnormal finding of blood chemistry, unspecified: Secondary | ICD-10-CM

## 2018-11-26 DIAGNOSIS — E872 Acidosis: Secondary | ICD-10-CM | POA: Diagnosis not present

## 2018-11-26 DIAGNOSIS — Z95828 Presence of other vascular implants and grafts: Secondary | ICD-10-CM

## 2018-11-26 DIAGNOSIS — I739 Peripheral vascular disease, unspecified: Secondary | ICD-10-CM | POA: Diagnosis present

## 2018-11-26 DIAGNOSIS — E860 Dehydration: Secondary | ICD-10-CM | POA: Diagnosis not present

## 2018-11-26 DIAGNOSIS — T83511A Infection and inflammatory reaction due to indwelling urethral catheter, initial encounter: Principal | ICD-10-CM | POA: Diagnosis present

## 2018-11-26 DIAGNOSIS — E43 Unspecified severe protein-calorie malnutrition: Secondary | ICD-10-CM | POA: Diagnosis present

## 2018-11-26 DIAGNOSIS — K6812 Psoas muscle abscess: Secondary | ICD-10-CM | POA: Diagnosis not present

## 2018-11-26 DIAGNOSIS — H548 Legal blindness, as defined in USA: Secondary | ICD-10-CM | POA: Diagnosis present

## 2018-11-26 DIAGNOSIS — N184 Chronic kidney disease, stage 4 (severe): Secondary | ICD-10-CM | POA: Diagnosis present

## 2018-11-26 DIAGNOSIS — I959 Hypotension, unspecified: Secondary | ICD-10-CM | POA: Diagnosis not present

## 2018-11-26 DIAGNOSIS — N179 Acute kidney failure, unspecified: Secondary | ICD-10-CM | POA: Diagnosis present

## 2018-11-26 DIAGNOSIS — I3139 Other pericardial effusion (noninflammatory): Secondary | ICD-10-CM

## 2018-11-26 DIAGNOSIS — D649 Anemia, unspecified: Secondary | ICD-10-CM | POA: Diagnosis present

## 2018-11-26 DIAGNOSIS — M5442 Lumbago with sciatica, left side: Secondary | ICD-10-CM | POA: Diagnosis present

## 2018-11-26 DIAGNOSIS — R1909 Other intra-abdominal and pelvic swelling, mass and lump: Secondary | ICD-10-CM

## 2018-11-26 DIAGNOSIS — I130 Hypertensive heart and chronic kidney disease with heart failure and stage 1 through stage 4 chronic kidney disease, or unspecified chronic kidney disease: Secondary | ICD-10-CM | POA: Diagnosis present

## 2018-11-26 DIAGNOSIS — M549 Dorsalgia, unspecified: Secondary | ICD-10-CM

## 2018-11-26 DIAGNOSIS — Y846 Urinary catheterization as the cause of abnormal reaction of the patient, or of later complication, without mention of misadventure at the time of the procedure: Secondary | ICD-10-CM | POA: Diagnosis not present

## 2018-11-26 DIAGNOSIS — R338 Other retention of urine: Secondary | ICD-10-CM | POA: Diagnosis present

## 2018-11-26 DIAGNOSIS — N400 Enlarged prostate without lower urinary tract symptoms: Secondary | ICD-10-CM | POA: Diagnosis present

## 2018-11-26 DIAGNOSIS — I4891 Unspecified atrial fibrillation: Secondary | ICD-10-CM

## 2018-11-26 DIAGNOSIS — L89610 Pressure ulcer of right heel, unstageable: Secondary | ICD-10-CM | POA: Diagnosis present

## 2018-11-26 DIAGNOSIS — Z87891 Personal history of nicotine dependence: Secondary | ICD-10-CM

## 2018-11-26 DIAGNOSIS — Z6823 Body mass index (BMI) 23.0-23.9, adult: Secondary | ICD-10-CM

## 2018-11-26 DIAGNOSIS — J90 Pleural effusion, not elsewhere classified: Secondary | ICD-10-CM

## 2018-11-26 DIAGNOSIS — B952 Enterococcus as the cause of diseases classified elsewhere: Secondary | ICD-10-CM | POA: Diagnosis present

## 2018-11-26 DIAGNOSIS — Z96642 Presence of left artificial hip joint: Secondary | ICD-10-CM | POA: Diagnosis present

## 2018-11-26 DIAGNOSIS — Z87440 Personal history of urinary (tract) infections: Secondary | ICD-10-CM

## 2018-11-26 DIAGNOSIS — I5021 Acute systolic (congestive) heart failure: Secondary | ICD-10-CM

## 2018-11-26 DIAGNOSIS — Z8249 Family history of ischemic heart disease and other diseases of the circulatory system: Secondary | ICD-10-CM

## 2018-11-26 DIAGNOSIS — T402X5A Adverse effect of other opioids, initial encounter: Secondary | ICD-10-CM | POA: Diagnosis not present

## 2018-11-26 DIAGNOSIS — Z833 Family history of diabetes mellitus: Secondary | ICD-10-CM

## 2018-11-26 LAB — COMPREHENSIVE METABOLIC PANEL
ALT: 9 U/L (ref 0–44)
AST: 16 U/L (ref 15–41)
Albumin: 2.5 g/dL — ABNORMAL LOW (ref 3.5–5.0)
Alkaline Phosphatase: 80 U/L (ref 38–126)
Anion gap: 10 (ref 5–15)
BUN: 46 mg/dL — ABNORMAL HIGH (ref 8–23)
CO2: 12 mmol/L — ABNORMAL LOW (ref 22–32)
Calcium: 5.4 mg/dL — CL (ref 8.9–10.3)
Chloride: 115 mmol/L — ABNORMAL HIGH (ref 98–111)
Creatinine, Ser: 2.54 mg/dL — ABNORMAL HIGH (ref 0.61–1.24)
GFR calc Af Amer: 26 mL/min — ABNORMAL LOW (ref >60)
GFR calc non Af Amer: 22 mL/min — ABNORMAL LOW (ref >60)
Glucose, Bld: 105 mg/dL — ABNORMAL HIGH (ref 70–99)
Potassium: 4.1 mmol/L (ref 3.5–5.1)
Sodium: 137 mmol/L (ref 135–145)
Total Bilirubin: 0.7 mg/dL (ref 0.3–1.2)
Total Protein: 6.1 g/dL — ABNORMAL LOW (ref 6.5–8.1)

## 2018-11-26 LAB — CBC WITH DIFFERENTIAL/PLATELET
Abs Immature Granulocytes: 0.11 10*3/uL — ABNORMAL HIGH (ref 0.00–0.07)
BASOS PCT: 0 %
Basophils Absolute: 0 10*3/uL (ref 0.0–0.1)
Eosinophils Absolute: 0 10*3/uL (ref 0.0–0.5)
Eosinophils Relative: 0 %
HCT: 27.5 % — ABNORMAL LOW (ref 39.0–52.0)
Hemoglobin: 8.6 g/dL — ABNORMAL LOW (ref 13.0–17.0)
Immature Granulocytes: 1 %
Lymphocytes Relative: 8 %
Lymphs Abs: 1 10*3/uL (ref 0.7–4.0)
MCH: 31.4 pg (ref 26.0–34.0)
MCHC: 31.3 g/dL (ref 30.0–36.0)
MCV: 100.4 fL — ABNORMAL HIGH (ref 80.0–100.0)
Monocytes Absolute: 1.5 10*3/uL — ABNORMAL HIGH (ref 0.1–1.0)
Monocytes Relative: 11 %
NEUTROS ABS: 10.2 10*3/uL — AB (ref 1.7–7.7)
Neutrophils Relative %: 80 %
Platelets: 176 10*3/uL (ref 150–400)
RBC: 2.74 MIL/uL — ABNORMAL LOW (ref 4.22–5.81)
RDW: 18.4 % — ABNORMAL HIGH (ref 11.5–15.5)
WBC: 12.8 10*3/uL — ABNORMAL HIGH (ref 4.0–10.5)
nRBC: 0 % (ref 0.0–0.2)

## 2018-11-26 LAB — LIPASE, BLOOD: Lipase: 16 U/L (ref 11–51)

## 2018-11-26 LAB — URINALYSIS, MICROSCOPIC (REFLEX)

## 2018-11-26 LAB — URINALYSIS, ROUTINE W REFLEX MICROSCOPIC
Bilirubin Urine: NEGATIVE
Glucose, UA: NEGATIVE mg/dL
Ketones, ur: NEGATIVE mg/dL
Nitrite: NEGATIVE
Protein, ur: 30 mg/dL — AB
Specific Gravity, Urine: 1.02 (ref 1.005–1.030)
pH: 5.5 (ref 5.0–8.0)

## 2018-11-26 LAB — I-STAT CG4 LACTIC ACID, ED: Lactic Acid, Venous: 0.72 mmol/L (ref 0.5–1.9)

## 2018-11-26 LAB — CK: Total CK: 246 U/L (ref 49–397)

## 2018-11-26 MED ORDER — FENTANYL CITRATE (PF) 100 MCG/2ML IJ SOLN
25.0000 ug | Freq: Once | INTRAMUSCULAR | Status: AC
  Administered 2018-11-26: 25 ug via INTRAVENOUS
  Filled 2018-11-26: qty 2

## 2018-11-26 MED ORDER — SODIUM CHLORIDE 0.9 % IV SOLN
1.0000 g | Freq: Once | INTRAVENOUS | Status: DC
  Filled 2018-11-26: qty 10

## 2018-11-26 MED ORDER — VITAMIN D (ERGOCALCIFEROL) 1.25 MG (50000 UNIT) PO CAPS
50000.0000 [IU] | ORAL_CAPSULE | ORAL | Status: DC
  Administered 2018-12-02 – 2018-12-09 (×2): 50000 [IU] via ORAL
  Filled 2018-11-26 (×2): qty 1

## 2018-11-26 MED ORDER — TAMSULOSIN HCL 0.4 MG PO CAPS
0.4000 mg | ORAL_CAPSULE | Freq: Every day | ORAL | Status: DC
  Administered 2018-11-26 – 2018-12-09 (×14): 0.4 mg via ORAL
  Filled 2018-11-26 (×14): qty 1

## 2018-11-26 MED ORDER — SODIUM CHLORIDE 0.9 % IV SOLN
1.0000 g | Freq: Once | INTRAVENOUS | Status: AC
  Administered 2018-11-26: 1 g via INTRAVENOUS
  Filled 2018-11-26: qty 10

## 2018-11-26 MED ORDER — OXYCODONE-ACETAMINOPHEN 5-325 MG PO TABS
1.0000 | ORAL_TABLET | ORAL | Status: DC | PRN
  Administered 2018-11-26 – 2018-11-30 (×10): 1 via ORAL
  Filled 2018-11-26 (×10): qty 1

## 2018-11-26 MED ORDER — OXYCODONE-ACETAMINOPHEN 5-325 MG PO TABS
1.0000 | ORAL_TABLET | Freq: Once | ORAL | Status: AC
  Administered 2018-11-26: 1 via ORAL
  Filled 2018-11-26: qty 1

## 2018-11-26 MED ORDER — METOPROLOL TARTRATE 5 MG/5ML IV SOLN
5.0000 mg | Freq: Once | INTRAVENOUS | Status: AC
  Administered 2018-11-26: 5 mg via INTRAVENOUS
  Filled 2018-11-26: qty 5

## 2018-11-26 MED ORDER — ACETAMINOPHEN 650 MG RE SUPP: 650.0000 mg | Freq: Four times a day (QID) | RECTAL | Status: DC | PRN

## 2018-11-26 MED ORDER — METOPROLOL TARTRATE 25 MG PO TABS
25.0000 mg | ORAL_TABLET | Freq: Two times a day (BID) | ORAL | Status: DC
  Administered 2018-11-26 – 2018-11-27 (×2): 25 mg via ORAL
  Filled 2018-11-26 (×4): qty 1

## 2018-11-26 MED ORDER — ONDANSETRON HCL 4 MG/2ML IJ SOLN
4.0000 mg | Freq: Four times a day (QID) | INTRAMUSCULAR | Status: DC | PRN
  Administered 2018-12-06: 4 mg via INTRAVENOUS
  Filled 2018-11-26: qty 2

## 2018-11-26 MED ORDER — CALCIUM GLUCONATE-NACL 1-0.675 GM/50ML-% IV SOLN
INTRAVENOUS | Status: AC
  Administered 2018-11-26: 1000 mg
  Filled 2018-11-26: qty 50

## 2018-11-26 MED ORDER — SODIUM CHLORIDE 0.9 % IV SOLN
INTRAVENOUS | Status: DC | PRN
  Administered 2018-11-26: 500 mL via INTRAVENOUS

## 2018-11-26 MED ORDER — VANCOMYCIN HCL IN DEXTROSE 1-5 GM/200ML-% IV SOLN
1000.0000 mg | Freq: Once | INTRAVENOUS | Status: AC
  Administered 2018-11-26: 1000 mg via INTRAVENOUS
  Filled 2018-11-26: qty 200

## 2018-11-26 MED ORDER — ONDANSETRON HCL 4 MG PO TABS: 4.0000 mg | ORAL_TABLET | Freq: Four times a day (QID) | ORAL | Status: DC | PRN

## 2018-11-26 MED ORDER — CALCIUM CARBONATE-VITAMIN D 500-200 MG-UNIT PO TABS
1.0000 | ORAL_TABLET | Freq: Two times a day (BID) | ORAL | Status: DC
  Administered 2018-11-26 – 2018-11-27 (×2): 1 via ORAL
  Filled 2018-11-26 (×2): qty 1

## 2018-11-26 MED ORDER — SODIUM CHLORIDE 0.9 % IV SOLN
1.0000 g | Freq: Four times a day (QID) | INTRAVENOUS | Status: DC
  Administered 2018-11-26 – 2018-11-29 (×12): 1 g via INTRAVENOUS
  Filled 2018-11-26 (×3): qty 1000
  Filled 2018-11-26: qty 1
  Filled 2018-11-26: qty 1000
  Filled 2018-11-26 (×2): qty 1
  Filled 2018-11-26: qty 1000
  Filled 2018-11-26 (×2): qty 1
  Filled 2018-11-26: qty 1000
  Filled 2018-11-26: qty 1
  Filled 2018-11-26: qty 1000

## 2018-11-26 MED ORDER — SODIUM BICARBONATE 650 MG PO TABS
650.0000 mg | ORAL_TABLET | Freq: Two times a day (BID) | ORAL | Status: DC
  Administered 2018-11-26 – 2018-12-10 (×28): 650 mg via ORAL
  Filled 2018-11-26 (×29): qty 1

## 2018-11-26 MED ORDER — LEVOTHYROXINE SODIUM 88 MCG PO TABS
88.0000 ug | ORAL_TABLET | Freq: Every day | ORAL | Status: DC
  Administered 2018-11-26 – 2018-12-10 (×14): 88 ug via ORAL
  Filled 2018-11-26 (×15): qty 1

## 2018-11-26 MED ORDER — ACETAMINOPHEN 325 MG PO TABS
650.0000 mg | ORAL_TABLET | Freq: Four times a day (QID) | ORAL | Status: DC | PRN
  Administered 2018-11-29 – 2018-12-09 (×5): 650 mg via ORAL
  Filled 2018-11-26 (×6): qty 2

## 2018-11-26 MED ORDER — SODIUM CHLORIDE 0.9 % IV BOLUS
500.0000 mL | Freq: Once | INTRAVENOUS | Status: AC
  Administered 2018-11-26: 500 mL via INTRAVENOUS

## 2018-11-26 NOTE — ED Notes (Signed)
Son Lennette Bihari) called requesting information and states that he is his POA and can get any information he wants.  Explained to son that I had no way to verify identity and that I could only tell him he was a patient here. Son hung up phone.

## 2018-11-26 NOTE — Evaluation (Addendum)
Physical Therapy Evaluation Patient Details Name: [name]. MRN: [id number] DOB: 14-Oct-1931 Today's Date: 11/26/2018   History of Present Illness  85yo M w/ PMH including AAA repair, BPH requiring I&O self cath, HTN, CKD, hearing loss who p/w lower abdominal pain, low back pain that radiates down his left leg.   Clinical Impression  Pt admitted with above diagnosis. Pt currently with functional limitations due to the deficits listed below (see PT Problem List). Mod assist for supine to sit and for sit to stand. Pt pivoted to recliner with increased time/effort 2* 8/10 pain in L flank and LE with activity. HR 105 with activity.  ST-SNF recommended.  Pt will benefit from skilled PT to increase their independence and safety with mobility to allow discharge to the venue listed below.       Follow Up Recommendations SNF;Supervision for mobility/OOB;Supervision/Assistance - 24 hour    Equipment Recommendations  Wheelchair (measurements PT);Wheelchair cushion (measurements PT)    Recommendations for Other Services       Precautions / Restrictions Precautions Precautions: Fall Precaution Comments: pt reports h/o frequent falls, unable to give details Restrictions Weight Bearing Restrictions: No      Mobility  Bed Mobility Overal bed mobility: Needs Assistance Bed Mobility: Supine to Sit     Supine to sit: Mod assist     General bed mobility comments: assist to raise trunk and pivot hips to edge of bed  Transfers Overall transfer level: Needs assistance Equipment used: Rolling walker (2 wheeled) Transfers: Sit to/from [facility] Sit to Stand: Mod assist Stand pivot transfers: Min assist       General transfer comment: assist to rise/steady, increased time, pain in L flank/LE with movement, all movement slow and labored 2* pain  Ambulation/Gait             General Gait Details: unable 2* pain  Stairs            Wheelchair Mobility     Modified Rankin (Stroke Patients Only)       Balance Overall balance assessment: Needs assistance Sitting-balance support: Feet supported;Single extremity supported Sitting balance-Leahy Scale: Fair     Standing balance support: Bilateral upper extremity supported Standing balance-Leahy Scale: Poor Standing balance comment: relies on BUE support                             Pertinent Vitals/Pain Pain Assessment: Faces Faces Pain Scale: Hurts whole lot Pain Location: back, L flank, LLE with movement Pain Descriptors / Indicators: Grimacing;Moaning Pain Intervention(s): Limited activity within patient's tolerance;Monitored during session;RN gave pain meds during session;Repositioned    Home Living Family/patient expects to be discharged to:: Private residence Living Arrangements: Alone     Home Access: Stairs to enter Entrance Stairs-Rails: None Entrance Stairs-Number of Steps: 3 Home Layout: One level Home Equipment: Environmental consultant - 2 wheels;Cane - single point      Prior Function Level of Independence: Independent with assistive device(s)         Comments: walks with RW     Hand Dominance   Dominant Hand: Right    Extremity/Trunk Assessment   Upper Extremity Assessment Upper Extremity Assessment: Generalized weakness    Lower Extremity Assessment Lower Extremity Assessment: RLE deficits/detail;LLE deficits/detail RLE Deficits / Details: knee ext -40* AROM, knee ext -2/5 RLE Sensation: history of peripheral neuropathy;decreased light touch LLE Deficits / Details: knee ext -40* AROM, knee et -2/5 LLE Sensation: history of  peripheral neuropathy;decreased light touch    Cervical / Trunk Assessment Cervical / Trunk Assessment: Kyphotic  Communication   Communication: HOH  Cognition Arousal/Alertness: Awake/alert Behavior During Therapy: WFL for tasks assessed/performed Overall Cognitive Status: No family/caregiver present to determine baseline  cognitive functioning                                        General Comments      Exercises     Assessment/Plan    PT Assessment Patient needs continued PT services  PT Problem List Decreased strength;Decreased balance;Decreased mobility;Decreased activity tolerance;Pain       PT Treatment Interventions DME instruction;Gait training;Functional mobility training;Therapeutic activities;Therapeutic exercise;Balance training;Neuromuscular re-education;Patient/family education    PT Goals (Current goals can be found in the Care Plan section)  Acute Rehab PT Goals Patient Stated Goal: be able to walk PT Goal Formulation: With patient Time For Goal Achievement: 12/10/18 Potential to Achieve Goals: Good    Frequency Min 2X/week   Barriers to discharge        Co-evaluation               AM-PAC PT "6 Clicks" Mobility  Outcome Measure Help needed turning from your back to your side while in a flat bed without using bedrails?: A Lot Help needed moving from lying on your back to sitting on the side of a flat bed without using bedrails?: A Lot Help needed moving to and from a bed to a chair (including a wheelchair)?: A Lot Help needed standing up from a chair using your arms (e.g., wheelchair or bedside chair)?: A Lot Help needed to walk in hospital room?: Total Help needed climbing 3-5 steps with a railing? : Total 6 Click Score: 10    End of Session Equipment Utilized During Treatment: Gait belt Activity Tolerance: Patient limited by pain;Patient limited by fatigue Patient left: in chair;with call bell/phone within reach Nurse Communication: Mobility status;Other (comment)(pt needs soft touch call bell 2* poor vision) PT Visit Diagnosis: Repeated falls (R29.6);Unsteadiness on feet (R26.81);Pain;Difficulty in walking, not elsewhere classified (R26.2);Muscle weakness (generalized) (M62.81) Pain - Right/Left: Left Pain - part of body: Leg    Time:  5726-2035 PT Time Calculation (min) (ACUTE ONLY): 23 min   Charges:   PT Evaluation $PT Eval Moderate Complexity: 1 Mod PT Treatments $Therapeutic Activity: 8-22 mins       Blondell Reveal Kistler PT 11/26/2018  Acute Rehabilitation Services Pager [phone] Office [phone]

## 2018-11-26 NOTE — ED Notes (Signed)
Pt asked what brought him to ED. He states " I haven't been feeling well for a while". RN asked pt what wasn't feeling well. Pt replied "everything". Pt was asked to be more specific but stated he was unable to. Pt asked if he had any new pain. Pt stated "yes". Pt also unable to pinpoint new pain. Pt very poor historian and difficult to obtain direct answers to any question. Pt oriented to self, situation, and place. States he is uncertain of the year "because I don't have any reason to know what year it is", but knows it is January, his birth month, and that Daisy Floro is president.

## 2018-11-26 NOTE — ED Notes (Signed)
Main pharmacy at [facility] phoned, [facility] pharmacist, confirms that Calcium Gluconate override order scanned for administration of ivpb is same as original order for Calcium Gluconate I gram in 64ml.

## 2018-11-26 NOTE — H&P (Signed)
History and Physical  Patient Name: [name].     NUU:[id number]    DOB: July 25, 1931    DOA: 11/26/2018 PCP: Mackie Pai, PA-C  Patient coming from: Home --[place] Primary Urologist: [place], [facility]   Chief Complaint: Flank pain, leg pain      HPI: [name]. is a 85 y.o. M with hx blindness, CKD IV baseline Cr 2.2-2.5, Afib not on AC, hx AAA repair 2011, hypothyoridism, BPH requiring I/O cath, and recent recurrent UTI once requiring PICC and meropenem who presents with left sided abdominal pain, back pain and pain radiating into the left leg with numbness for 2 days.  The patient had been well since his last UTI until about a week ago, he developed new malaise, abdominal pain, and fatigue.  Was seen in the ER, diagnosed with urinary retention, and mild AKI, given IV fluids and discharged.  Urine culture at that time grew Enterococcus, patient's primary urologist recommended no treatment as he had no dysuria, urinary urgency.  He was noted to have a new left psoas mass, sarcoma vs renal mass vs abscess and was referred by his Urologist the next day for percutaneous biopsy.  Since then, the patient has developed worsening left sided pain to the point that he can no longer stand, transfer or walk.  His abdominal pain has now progressed into the legs, primarily the left leg, and he has numbness in the left leg.  No fever, confusion, syncope, dysuria.  ED course: - Afebrile, heart rate 47, respirations and pulse ox normal, blood pressure 120/76 -Na 137, K 4.1, Cr 2.5 (baseline 2.5), WBC 12.8K, Hgb 8.6 - Transaminases normal - Calcium 5.4 Albumin 2.5 - Bicarb 12, anion gap normal CK 246 -ECG showed atrial fibrillation, rate 122 -Lipase 16 -Lactate normal -Urinalysis many bacteria -She was given vancomycin and ceftriaxone in the hospital service were asked to evaluate for dehydration, flank pain           ROS: Review of Systems  Constitutional:  Positive for malaise/fatigue and weight loss. Negative for chills and fever.  Respiratory: Negative for cough, sputum production, shortness of breath and wheezing.   Cardiovascular: Negative for chest pain and leg swelling.  Gastrointestinal: Positive for abdominal pain. Negative for nausea and vomiting.  Genitourinary: Positive for flank pain. Negative for dysuria, frequency, hematuria and urgency.  Musculoskeletal: Positive for back pain.  Neurological: Negative for sensory change.  All other systems reviewed and are negative.         Past Medical History:  Diagnosis Date  . AAA (abdominal aortic aneurysm) ([place])   . Arthritis   . BPH (benign prostatic hyperplasia)   . Complication of anesthesia    "a tough time"  -pt unable to explain  . Glucose intolerance (impaired glucose tolerance)   . Hypertension   . Hypothyroid   . Pancreatitis 15-20 yrs ago  . Peripheral neuropathy    fingers and toes  . Renal insufficiency     Past Surgical History:  Procedure Laterality Date  . ABDOMINAL AORTAGRAM N/A 05/06/2014   Procedure: ABDOMINAL Maxcine Ham;  Surgeon: Serafina Mitchell, MD;  Location: [facility] CATH LAB;  Service: Cardiovascular;  Laterality: N/A;  . ABDOMINAL AORTIC ANEURYSM REPAIR  july 2011  . AMPUTATION Right 01/03/2013   Procedure: AMPUTATION DIGIT;  Surgeon: Wylene Simmer, MD;  Location: [facility] ORS;  Service: Orthopedics;  Laterality: Right;  RIGHT 2ND TOE AMPUTATION  . HIP PINNING,CANNULATED Left 08/08/2013   Procedure: LEFT  CANNULATED HIP PINNING;  Surgeon: Rozanna Box, MD;  Location: [place];  Service: Orthopedics;  Laterality: Left;  . PROSTATE SURGERY  5 yrs ago   urethral dilation  . STERIOD INJECTION Bilateral 08/08/2013   Procedure: STEROID INJECTION;  Surgeon: Rozanna Box, MD;  Location: [place];  Service: Orthopedics;  Laterality: Bilateral;  . TOTAL HIP REVISION Left 02/10/2014   Procedure: REMOVAL OF SYNTHESE SCREWS LEFT HIP AND CONVERSION LEFT TOTAL HIP ARTHROPLASTY;   Surgeon: Mauri Pole, MD;  Location: [facility];  Service: Orthopedics;  Laterality: Left;    Social History: Patient lives alone but has frequent support from his son.  The patient walks unassisted.  Nonsmoker.  Allergies  Allergen Reactions  . Aspirin Other (See Comments)    Low bp, stomach pains  . Shellfish Allergy     Family history: family history includes Diabetes in his son; Heart attack in his father and mother; Heart disease in his mother.  Prior to Admission medications   Medication Sig Start Date End Date Taking? Authorizing Provider  calcium-vitamin D 250-100 MG-UNIT tablet Take 1 tablet by mouth 2 (two) times daily.   Yes [provider]  levothyroxine (SYNTHROID, LEVOTHROID) 88 MCG tablet TAKE 1 TABLET EVERY MORNING Patient taking differently: Take 88 mcg by mouth daily before breakfast.  01/13/16  Yes Denita Lung, MD  sodium bicarbonate 650 MG tablet Take 650 mg by mouth 2 (two) times daily.   Yes [provider]  tamsulosin (FLOMAX) 0.4 MG CAPS capsule Take 0.4 mg by mouth at bedtime.   Yes [provider]  Vitamin D, Ergocalciferol, (DRISDOL) 50000 UNITS CAPS capsule Take 50,000 Units by mouth every 7 (seven) days. On Sunday   Yes [provider]  meropenem [facility]) 1 g injection  08/08/18   [provider]  metoprolol tartrate (LOPRESSOR) 25 MG tablet  08/08/18   [provider]       Physical Exam: BP 124/70 (BP Location: Left Arm)   Pulse 74   Temp 97.6 F (36.4 C) (Oral)   Resp 16   Ht 5' 8.5" (1.74 m)   Wt 75.2 kg   SpO2 100%   BMI 24.84 kg/m  General appearance: Cachectic elderly adult male, alert and in no acute distress .   Eyes: Anicteric, conjunctiva pink, lids and lashes normal. ENT: No nasal deformity, discharge, epistaxis.  Hearing diminished. OP moist without lesions.   Neck: No neck masses.  Trachea midline.  No thyromegaly/tenderness. Lymph: No cervical or supraclavicular  lymphadenopathy. Skin: Warm and dry.  No jaundice.  No suspicious rashes or lesions. Cardiac: Tachycardic, irregular, nl S1-S2, no murmurs appreciated.  Capillary refill is brisk.  JVP not visible.  No LE edema.  Radial  pulses 2+ and symmetric. Respiratory: Normal respiratory rate and rhythm.  CTAB without rales or wheezes. Abdomen: Abdomen soft.  Mild left-sided TTP without guarding or rebound. No ascites, distension, hepatosplenomegaly.   MSK: No deformities or effusions of the large joints of the upper or lower extremities bilaterally.  No cyanosis or clubbing.  Diffuse loss of subcutaneous muscle mass and fat severe. Neuro: Cranial nerves 3 through 12 intact, completely blind.  Sensation intact to light touch in the left leg. Speech is fluent.  Muscle strength 5/5 and symmetric.    Psych: Sensorium intact and responding to questions, attention normal.  Behavior appropriate.  Affect normal.  Judgment and insight appear slightly impaired, may be mild cognitive impairment.     Labs on  Admission:  I have personally reviewed following labs and imaging studies: CBC: Recent Labs  Lab 11/26/18 0216  WBC 12.8*  NEUTROABS 10.2*  HGB 8.6*  HCT 27.5*  MCV 100.4*  PLT 627   Basic Metabolic Panel: Recent Labs  Lab 11/19/18 2128 11/26/18 0216  NA 134* 137  K 3.7 4.1  CL 114* 115*  CO2 12* 12*  GLUCOSE 148* 105*  BUN 62* 46*  CREATININE 2.57* 2.54*  CALCIUM 5.4* 5.4*   GFR: Estimated Creatinine Clearance: 20.9 mL/min (A) (by C-G formula based on SCr of 2.54 mg/dL (H)).  Liver Function Tests: Recent Labs  Lab 11/26/18 0216  AST 16  ALT 9  ALKPHOS 80  BILITOT 0.7  PROT 6.1*  ALBUMIN 2.5*   Recent Labs  Lab 11/26/18 0216  LIPASE 16   No results for input(s): AMMONIA in the last 168 hours. Coagulation Profile: No results for input(s): INR, PROTIME in the last 168 hours. Cardiac Enzymes: Recent Labs  Lab 11/26/18 0216  CKTOTAL 246   BNP (last 3 results) Recent Labs      08/22/18 1224  PROBNP 371.0*   HbA1C: No results for input(s): HGBA1C in the last 72 hours. CBG: No results for input(s): GLUCAP in the last 168 hours. Lipid Profile: No results for input(s): CHOL, HDL, LDLCALC, TRIG, CHOLHDL, LDLDIRECT in the last 72 hours. Thyroid Function Tests: No results for input(s): TSH, T4TOTAL, FREET4, T3FREE, THYROIDAB in the last 72 hours. Anemia Panel: No results for input(s): VITAMINB12, FOLATE, FERRITIN, TIBC, IRON, RETICCTPCT in the last 72 hours. Sepsis Labs:   Recent Results (from the past 240 hour(s))  Urine culture     Status: Abnormal   Collection Time: 11/19/18  4:40 PM  Result Value Ref Range Status   Specimen Description   Final    URINE, CATHETERIZED Performed at [facility], [address] [place], [place] [address]    Special Requests   Final    Normal Performed at [facility], [address] [place], [place] [address]    Culture >=100,000 COLONIES/mL ENTEROCOCCUS FAECALIS (A)  Final   Report Status 11/22/2018 FINAL  Final   Organism ID, Bacteria ENTEROCOCCUS FAECALIS (A)  Final      Susceptibility   Enterococcus faecalis - MIC*    AMPICILLIN <=2 SENSITIVE Sensitive     LEVOFLOXACIN 1 SENSITIVE Sensitive     NITROFURANTOIN <=16 SENSITIVE Sensitive     VANCOMYCIN 1 SENSITIVE Sensitive     * >=100,000 COLONIES/mL ENTEROCOCCUS FAECALIS  Blood culture (routine x 2)     Status: None (Preliminary result)   Collection Time: 11/26/18  5:30 AM  Result Value Ref Range Status   Specimen Description BLOOD RIGHT ANTECUBITAL  Final   Special Requests   Final    BOTTLES DRAWN AEROBIC AND ANAEROBIC Blood Culture adequate volume Performed at [facility], [address] [place], [place] [address]    Culture NO GROWTH < 12 HOURS  Final   Report Status PENDING  Incomplete  Blood culture (routine x 2)     Status: None (Preliminary result)   Collection Time: 11/26/18  5:45 AM  Result Value Ref Range  Status   Specimen Description BLOOD LEFT WRIST  Final   Special Requests   Final    BOTTLES DRAWN AEROBIC AND ANAEROBIC Blood Culture adequate volume Performed at [facility], [address] [place], [place] [address]    Culture NO GROWTH <  12 HOURS  Final   Report Status PENDING  Incomplete           Radiological Exams on Admission: Personally reviewed CT abdomen report; chest x-ray personally reviewed, shows no focal airspace disease or opacity or edema.: Ct Abdomen Pelvis Wo Contrast  Result Date: 11/26/2018 CLINICAL DATA:  Left lower quadrant pain radiating down leg since Foley catheter placement on 11/19/2018 EXAM: CT ABDOMEN AND PELVIS WITHOUT CONTRAST TECHNIQUE: Multidetector CT imaging of the abdomen and pelvis was performed following the standard protocol without IV contrast. COMPARISON:  11/19/2018 CT FINDINGS: Lower chest: Slightly larger dependent posterior pleural effusions bilaterally. The included heart size is top-normal with coronary arteriosclerosis and mitral annular calcifications present. Hepatobiliary: Stable low-density cyst in the medial left hepatic lobe measuring 2.7 cm, unchanged in appearance. No biliary dilatation. Gallbladder is physiologically distended without radiopaque calculi. Pancreas: Pancreatic head cystic mass is redemonstrated likely representing a large pseudocyst from prior pancreatitis given pancreatic gland calcifications in the pancreatic head. This measures approximately 6.9 x 4 9 cm and is stable as well. Unopacified duodenum is seen adjacent to it. The remainder of the pancreatic gland is atrophic. No dilatation or acute inflammation. Spleen: Normal Adrenals/Urinary Tract: Normal bilateral adrenal glands. Simple and complex cysts of both kidneys are redemonstrated without nephrolithiasis nor obstructive uropathy. Retroperitoneal masslike abnormality on the left is redemonstrated with scattered areas of internal fat. Differential  considerations remain and iliopsoas liposarcoma, exophytic renal mass arising off the lower pole, less likely adenopathy. The urinary bladder is difficult to visualized due to metallic streak artifacts from the patient's hip arthroplasty. Does appear that the Foley balloon catheter may be deployed partially within the prostatic gland along the prostatic urethra repositioning is suggested. Stomach/Bowel: Small hiatal hernia. Physiologic distention of the stomach and small bowel. Moderate stool retention within the colon. No definite bowel obstruction or inflammation. Vascular/Lymphatic: Status post surgical graft repair of the abdominal aorta without complicating features. No pathologically enlarged lymphadenopathy. Reproductive: Top-normal size prostate Other: No pneumoperitoneum, ascites or focal fluid collection. Musculoskeletal: Moderate thoracolumbar spondylosis. Left hip arthroplasty without complicating features. IMPRESSION: 1. The urinary bladder is difficult to visualized due to metallic streak artifacts from the patient's hip arthroplasty. It does appear that the Foley balloon catheter may be deployed partially within the prostatic gland within the expected location of the prostatic urethra and therefore repositioning is suggested. 2. Stable cystic mass in the pancreatic head measuring 6.9 x 4 9 cm likely representing a large pseudocyst from prior pancreatitis. 3. Stable low-density cyst in the medial left hepatic lobe measuring 2.7 cm. 4. Retroperitoneal masslike abnormality on the left with scattered areas of internal macroscopic fat. Differential considerations remain iliopsoas liposarcoma, exophytic renal mass arising off the lower pole, less likely adenopathy among some considerations. Electronically Signed   By: Ashley Royalty M.D.   On: 11/26/2018 04:01   Dg Chest 2 View  Result Date: 11/26/2018 CLINICAL DATA:  Left lower quadrant pain EXAM: CHEST - 2 VIEW COMPARISON:  08/22/2018 FINDINGS: Borderline  cardiomegaly with tortuous atherosclerotic aorta. Emphysematous hyperinflation of the lungs with faint subpleural areas of fine reticular lung markings that may represent areas of fibrosis. Small posterior pleural effusions have slightly increased since prior. Degenerative change is noted along the dorsal spine. IMPRESSION: Slight interval increase in posterior small bilateral pleural effusions. Mild fine interstitial lung markings bilaterally that may reflect fibrosis. Electronically Signed   By: Ashley Royalty M.D.   On: 11/26/2018 04:02   Ct Head Wo Contrast  Result Date: 11/26/2018 CLINICAL DATA:  Altered level of consciousness. Anisocoria. EXAM: CT HEAD WITHOUT CONTRAST TECHNIQUE: Contiguous axial images were obtained from the base of the skull through the vertex without intravenous contrast. COMPARISON:  08/07/2013 FINDINGS: Brain: Chronic generalized atrophy. Chronic small-vessel ischemic changes extensively present within the pons. No focal cerebellar insult. Cerebral hemispheres show chronic small-vessel ischemic change of the white matter. No large vessel territory infarction. No mass lesion, hemorrhage, hydrocephalus or extra-axial collection. Vascular: There is atherosclerotic calcification of the major vessels at the base of the brain. Skull: Negative Sinuses/Orbits: Paranasal sinusitis. Orbits negative. Other: None IMPRESSION: No acute finding by CT. Generalized atrophy. Chronic small-vessel ischemic changes, extensive affecting the pons. Inflammatory changes of the paranasal sinuses. Electronically Signed   By: Nelson Chimes M.D.   On: 11/26/2018 06:33   Mr Abdomen Wo Contrast  Result Date: 11/26/2018 CLINICAL DATA:  Renal mass on recent CT EXAM: MRI ABDOMEN WITHOUT CONTRAST TECHNIQUE: Multiplanar multisequence MR imaging was performed without the administration of intravenous contrast. COMPARISON:  CT abdomen/pelvis dated 11/26/2018, 11/19/2018, and 07/27/2018 FINDINGS: Motion degraded images.  Lower chest: Small bilateral pleural effusions. Associated lower lobe atelectasis. Hepatobiliary: 2.6 cm cyst in segment 4A (series 6/image 12). Gallbladder is unremarkable. No intrahepatic or extrahepatic ductal dilatation. Pancreas: Chronic pseudocyst in the pancreatic head/uncinate process, measuring 4.5 x 5.6 cm, which directly communicates with the posterior gastric antrum and proximal duodenum (series 6/image 22). Atrophy of the pancreatic body/tail, chronic. Known parenchymal calcifications in the uncinate process are not evident on MRI. Spleen:  Subcentimeter splenic cyst. Adrenals/Urinary Tract:  Adrenal glands are within normal limits. Bilateral renal cysts, measuring up to 7.2 cm in the lateral left lower kidney (series 3/image 34). Three right renal cysts demonstrate hemorrhage. Medial left upper kidney is displaced by a retroperitoneal mass (described below) involving the left psoas muscle. No hydronephrosis. Stomach/Bowel: Stomach is otherwise within normal limits. Visualized bowel is grossly unremarkable. Vascular/Lymphatic: Postsurgical changes involving the abdominal aorta. No evidence of abdominal aortic aneurysm. No suspicious abdominal lymphadenopathy. Other: 8.1 x 6.0 x 7.0 cm retroperitoneal mass abutting/involving the left psoas muscle (series 10/image 43), new from 07/27/2018. No intrinsic T1 hyperintensity to suggest hemorrhage. Given rapid growth, an infectious/inflammatory etiology is favored, possibly reflecting a psoas abscess. While neoplasm such as sarcoma is technically possible, and can be very aggressive, this degree of rapid progression (without an underlying lesion even in retrospect) would be unexpected. Musculoskeletal: No focal osseous lesions. IMPRESSION: Motion degraded images. Evaluation is also limited due to lack of intravenous contrast administration. 8.1 x 6.0 x 7.0 cm retroperitoneal mass abutting/involving the left psoas muscle, new from September 2019, favoring  infectious/inflammatory etiology, possibly reflecting a psoas abscess. Aggressive neoplasm such as retroperitoneal sarcoma is considered less likely given the degree of rapid progression. Additional stable ancillary findings as above. Electronically Signed   By: Julian Hy M.D.   On: 11/26/2018 18:13    EKG: Independently reviewed.  ECG personally reviewed, shows atrial fibrillation, rate 122.       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 - Obtain MRI without contrast - Consult IR for percutaneous drainage of the abscess versus biopsy of cancer   Possible urinary tract infection Urine culture from 3 days ago growing enterococcus, still bacteria in urine.  Leukocytosis, possible perinephric abscess. -Ampicillin IV for now  Anemia of chronic renal disease -Check EPO -Continue home iron  Hypocalcemia From chronic renal failure -Check PTH, phosphorus, 25 OH vitamin D -Continue vitamin  D supplements  Metabolic acidosis From chronic renal failure -Continue home bicarb  BPH -Continue Flomax -In and out cath as needed  Atrial fibrillation, chronic On anticoagulation, unclear.  Given upcoming biopsy versus percutaneous drainage, will not start now. -Continue metoprolol  Hypertension Blood pressure normal -Continue metoprolol  Hypothyroidism -Continue levothyroxine      DVT prophylaxis: CDs Code Status: Full code Family Communication: Son by phone Disposition Plan: Anticipate MRI, IR eval.  Further disposition pending IR plans. Consults called: IR, urology, PT Admission status: Observation   At the point of initial evaluation, it is my clinical opinion that admission for OBSERVATION is reasonable and necessary because the patient's presenting complaints in the context of their chronic conditions represent sufficient risk of deterioration or significant morbidity to constitute reasonable grounds for close  observation in the hospital setting, but that the patient may be medically stable for discharge from the hospital within 24 to 48 hours.    Medical decision making: Patient seen at 12:00 PM on 11/26/2018.  The patient was discussed with Dr. Wyvonnia Dusky.  What exists of the patient's chart was reviewed in depth and summarized above.  Clinical condition: stable.        [place] [facility] Pager: please page via [email or web address], enter password "[email or web address]", and identify attending under [facility]

## 2018-11-26 NOTE — ED Provider Notes (Signed)
[facility] EMERGENCY DEPARTMENT Provider Note   CSN: [id number] Arrival date & time: 11/26/18  0205     History   Chief Complaint Chief Complaint  Patient presents with  . Abdominal Pain    HPI [name]. is a 85 y.o. male.  85yo M w/ PMH including AAA repair, BPH requiring I&O self cath, HTN, CKD, hearing loss who p/w lower abdominal pain, low back pain that radiates down his left leg.  States this pain is been ongoing for the past 2 days.  Denies any falls or trauma.  States he does have a history of back pain as well as peripheral neuropathy but this feels different.  Notably patient was seen in the ED on December 30 for lower abdominal pain and found to have urinary retention as well as a retroperitoneal mass on the left flank and was going to follow-up with urology.  Patient states his Foley catheter has been draining well since then.  He is not had a fever, chills, nausea or vomiting.  States he is not had a regular bowel movement for a long time but is passing gas and intermittent stool.  He is here by himself and not a great historian.  Denies any chest pain or shortness of breath.  No cough or fever.  He thinks the pain is due to his Foley catheter and does not recall being told about the mass on his CT scan.  The history is provided by the patient and the EMS personnel.  Abdominal Pain   Associated symptoms include constipation, arthralgias and myalgias. Pertinent negatives include fever, nausea, vomiting, dysuria, hematuria and headaches.    Past Medical History:  Diagnosis Date  . AAA (abdominal aortic aneurysm) ([place])   . Arthritis   . BPH (benign prostatic hyperplasia)   . Complication of anesthesia    "a tough time"  -pt unable to explain  . Glucose intolerance (impaired glucose tolerance)   . Hypertension   . Hypothyroid   . Pancreatitis 15-20 yrs ago  . Peripheral neuropathy    fingers and toes  . Renal insufficiency     Patient Active  Problem List   Diagnosis Date Noted  . Onychomycosis 02/27/2015  . Pain in lower limb 02/27/2015  . Vitamin D deficiency 11/06/2014  . Hypocalcemia 09/26/2014  . Peripheral neuropathy 09/26/2014  . Thrombocytopenia ([place]) 09/26/2014  . Macular degeneration 09/25/2014  . 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 S/P left hip revision 02/10/2014  . Abdominal aortic aneurysm ([place]) 09/30/2013  . Anemia of chronic disease 08/07/2013  . Borderline diabetes mellitus 01/02/2013  . CKD (chronic kidney disease), stage III ([place]) 01/02/2013  . Hypothyroid 10/17/2011  . Smokers' cough ([place]) 10/17/2011  . 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 BPH (benign prostatic hyperplasia) 10/17/2011    Past Surgical History:  Procedure Laterality Date  . ABDOMINAL AORTAGRAM N/A 05/06/2014   Procedure: ABDOMINAL Maxcine Ham;  Surgeon: Serafina Mitchell, MD;  Location: [facility] CATH LAB;  Service: Cardiovascular;  Laterality: N/A;  . ABDOMINAL AORTIC ANEURYSM REPAIR  july 2011  . AMPUTATION Right 01/03/2013   Procedure: AMPUTATION DIGIT;  Surgeon: Wylene Simmer, MD;  Location: [facility] ORS;  Service: Orthopedics;  Laterality: Right;  RIGHT 2ND TOE AMPUTATION  . HIP PINNING,CANNULATED Left 08/08/2013   Procedure: LEFT CANNULATED HIP PINNING;  Surgeon: Rozanna Box, MD;  Location: [facility];  Service: Orthopedics;  Laterality: Left;  . PROSTATE SURGERY  5 yrs ago  urethral dilation  . STERIOD INJECTION Bilateral 08/08/2013   Procedure: STEROID INJECTION;  Surgeon: Rozanna Box, MD;  Location: [facility];  Service: Orthopedics;  Laterality: Bilateral;  . TOTAL HIP REVISION Left 02/10/2014   Procedure: REMOVAL OF SYNTHESE SCREWS LEFT HIP AND CONVERSION LEFT TOTAL HIP ARTHROPLASTY;  Surgeon: Mauri Pole, MD;  Location: [facility];  Service: Orthopedics;  Laterality: Left;        Home Medications    Prior to Admission medications   Medication Sig Start Date End Date Taking? Authorizing  Provider  calcium-vitamin D 250-100 MG-UNIT tablet Take 1 tablet by mouth 2 (two) times daily.    [provider]  cholecalciferol (VITAMIN D) 1000 UNITS tablet Take 2,000 Units by mouth daily.    [provider]  doxycycline (VIBRA-TABS) 100 MG tablet Take 1 tablet (100 mg total) by mouth 2 (two) times daily. 08/22/18   Saguier, Percell Miller, PA-C  levothyroxine (SYNTHROID, LEVOTHROID) 88 MCG tablet TAKE 1 TABLET EVERY MORNING 01/13/16   Denita Lung, MD  meropenem [facility]) 1 g injection  08/08/18   [provider]  metoprolol tartrate (LOPRESSOR) 25 MG tablet  08/08/18   [provider]  silver sulfADIAZINE (SILVADENE) 1 % cream Apply pea-sized amount to wound daily. 09/27/18   Evelina Bucy, DPM  Vitamin D, Ergocalciferol, (DRISDOL) 50000 UNITS CAPS capsule TAKE 1 CAPSULE EVERY 7 DAYS 06/17/15   Denita Lung, MD  Vitamin D, Ergocalciferol, (DRISDOL) 50000 UNITS CAPS capsule Take 50,000 Units by mouth every 7 (seven) days. On Sunday    [provider]    Family History Family History  Problem Relation Age of Onset  . Heart attack Mother   . Heart disease Mother   . Heart attack Father   . Diabetes Son        amputation-BKA     Social History Social History   Tobacco Use  . Smoking status: Former Smoker    Packs/day: 0.30    Years: 60.00    Pack years: 18.00    Last attempt to quit: 10/02/2012    Years since quitting: 6.1  . Smokeless tobacco: Never Used  Substance Use Topics  . Alcohol use: Yes    Alcohol/week: 0.0 standard drinks    Comment: occasioally  . Drug use: No     Allergies   Aspirin and Shellfish allergy   Review of Systems Review of Systems  Constitutional: Negative for activity change, appetite change and fever.  HENT: Negative for congestion.   Respiratory: Negative for cough, chest tightness and shortness of breath.   Gastrointestinal: Positive for abdominal pain and constipation. Negative for nausea and  vomiting.  Genitourinary: Negative for difficulty urinating, dysuria and hematuria.  Musculoskeletal: Positive for arthralgias, back pain and myalgias.  Neurological: Negative for headaches.    all other systems are negative except as noted in the HPI and PMH.    Physical Exam Updated Vital Signs BP 118/63   Pulse 95   Resp 16   Ht 5' 8.5" (1.74 m)   Wt 75.2 kg   SpO2 100%   BMI 24.84 kg/m   Physical Exam Vitals signs and nursing note reviewed.  Constitutional:      General: He is not in acute distress.    Appearance: He is well-developed.     Comments: Hard of hearing  HENT:     Head: Normocephalic and atraumatic.     Mouth/Throat:     Pharynx: No oropharyngeal exudate.  Eyes:  Extraocular Movements: Extraocular movements intact.     Conjunctiva/sclera: Conjunctivae normal.     Pupils: Pupils are equal, round, and reactive to light.     Comments: Left pupil approximately 3 mm and sluggish, right pupil 1 mm and sluggish Purulent discharge from left eye  Neck:     Musculoskeletal: Normal range of motion and neck supple.     Comments: No meningismus. Cardiovascular:     Rate and Rhythm: Tachycardia present. Rhythm irregular.     Heart sounds: Normal heart sounds. No murmur.  Pulmonary:     Effort: Pulmonary effort is normal. No respiratory distress.     Breath sounds: Normal breath sounds.  Abdominal:     Palpations: Abdomen is soft.     Tenderness: There is abdominal tenderness. There is no guarding or rebound.     Comments: Mild diffuse lower abdominal tenderness without guarding or rebound.  Equal femoral pulses bilaterally  Genitourinary:    Comments: Foley catheter in place draining cloudy yellow urine Musculoskeletal: Normal range of motion.        General: No tenderness.     Comments: 5/5 strength in lower extremities.  Intact DP and PT pulses with Doppler bilaterally  Paraspinal lumbar tenderness bilaterally  Skin:    General: Skin is warm.      Capillary Refill: Capillary refill takes less than 2 seconds.     Comments: Venous stasis changes of lower extremities  Neurological:     Mental Status: He is alert and oriented to person, place, and time.     Cranial Nerves: No cranial nerve deficit.     Motor: No abnormal muscle tone.     Coordination: Coordination normal.     Comments: No ataxia on finger to nose bilaterally. No pronator drift. 5/5 strength throughout. CN 2-12 intact.Equal grip strength. Sensation intact.   Psychiatric:        Behavior: Behavior normal.      ED Treatments / Results  Labs (all labs ordered are listed, but only abnormal results are displayed) Labs Reviewed  CBC WITH DIFFERENTIAL/PLATELET - Abnormal; Notable for the following components:      Result Value   WBC 12.8 (*)    RBC 2.74 (*)    Hemoglobin 8.6 (*)    HCT 27.5 (*)    MCV 100.4 (*)    RDW 18.4 (*)    Neutro Abs 10.2 (*)    Monocytes Absolute 1.5 (*)    Abs Immature Granulocytes 0.11 (*)    All other components within normal limits  COMPREHENSIVE METABOLIC PANEL - Abnormal; Notable for the following components:   Chloride 115 (*)    CO2 12 (*)    Glucose, Bld 105 (*)    BUN 46 (*)    Creatinine, Ser 2.54 (*)    Calcium 5.4 (*)    Total Protein 6.1 (*)    Albumin 2.5 (*)    GFR calc non Af Amer 22 (*)    GFR calc Af Amer 26 (*)    All other components within normal limits  URINALYSIS, ROUTINE W REFLEX MICROSCOPIC - Abnormal; Notable for the following components:   APPearance CLOUDY (*)    Hgb urine dipstick SMALL (*)    Protein, ur 30 (*)    Leukocytes, UA TRACE (*)    All other components within normal limits  URINALYSIS, MICROSCOPIC (REFLEX) - Abnormal; Notable for the following components:   Bacteria, UA MANY (*)    All other components within normal limits  URINE CULTURE  CULTURE, BLOOD (ROUTINE X 2)  CULTURE, BLOOD (ROUTINE X 2)  LIPASE, BLOOD  CK  I-STAT CG4 LACTIC ACID, ED    EKG EKG  Interpretation  Date/Time:  Monday November 26 2018 02:10:28 EST Ventricular Rate:  122 PR Interval:    QRS Duration: 86 QT Interval:  342 QTC Calculation: 526 R Axis:   -63 Text Interpretation:  Atrial fibrillation Left anterior fascicular block Anterior infarct, age indeterminate Prolonged QT interval Baseline wander in lead(s) V3 V5 V6 Rate faster Confirmed by Ezequiel Essex [phone]) on 11/26/2018 2:46:09 AM   Radiology Ct Abdomen Pelvis Wo Contrast  Result Date: 11/26/2018 CLINICAL DATA:  Left lower quadrant pain radiating down leg since Foley catheter placement on 11/19/2018 EXAM: CT ABDOMEN AND PELVIS WITHOUT CONTRAST TECHNIQUE: Multidetector CT imaging of the abdomen and pelvis was performed following the standard protocol without IV contrast. COMPARISON:  11/19/2018 CT FINDINGS: Lower chest: Slightly larger dependent posterior pleural effusions bilaterally. The included heart size is top-normal with coronary arteriosclerosis and mitral annular calcifications present. Hepatobiliary: Stable low-density cyst in the medial left hepatic lobe measuring 2.7 cm, unchanged in appearance. No biliary dilatation. Gallbladder is physiologically distended without radiopaque calculi. Pancreas: Pancreatic head cystic mass is redemonstrated likely representing a large pseudocyst from prior pancreatitis given pancreatic gland calcifications in the pancreatic head. This measures approximately 6.9 x 4 9 cm and is stable as well. Unopacified duodenum is seen adjacent to it. The remainder of the pancreatic gland is atrophic. No dilatation or acute inflammation. Spleen: Normal Adrenals/Urinary Tract: Normal bilateral adrenal glands. Simple and complex cysts of both kidneys are redemonstrated without nephrolithiasis nor obstructive uropathy. Retroperitoneal masslike abnormality on the left is redemonstrated with scattered areas of internal fat. Differential considerations remain and iliopsoas liposarcoma, exophytic  renal mass arising off the lower pole, less likely adenopathy. The urinary bladder is difficult to visualized due to metallic streak artifacts from the patient's hip arthroplasty. Does appear that the Foley balloon catheter may be deployed partially within the prostatic gland along the prostatic urethra repositioning is suggested. Stomach/Bowel: Small hiatal hernia. Physiologic distention of the stomach and small bowel. Moderate stool retention within the colon. No definite bowel obstruction or inflammation. Vascular/Lymphatic: Status post surgical graft repair of the abdominal aorta without complicating features. No pathologically enlarged lymphadenopathy. Reproductive: Top-normal size prostate Other: No pneumoperitoneum, ascites or focal fluid collection. Musculoskeletal: Moderate thoracolumbar spondylosis. Left hip arthroplasty without complicating features. IMPRESSION: 1. The urinary bladder is difficult to visualized due to metallic streak artifacts from the patient's hip arthroplasty. It does appear that the Foley balloon catheter may be deployed partially within the prostatic gland within the expected location of the prostatic urethra and therefore repositioning is suggested. 2. Stable cystic mass in the pancreatic head measuring 6.9 x 4 9 cm likely representing a large pseudocyst from prior pancreatitis. 3. Stable low-density cyst in the medial left hepatic lobe measuring 2.7 cm. 4. Retroperitoneal masslike abnormality on the left with scattered areas of internal macroscopic fat. Differential considerations remain iliopsoas liposarcoma, exophytic renal mass arising off the lower pole, less likely adenopathy among some considerations. Electronically Signed   By: Ashley Royalty M.D.   On: 11/26/2018 04:01   Dg Chest 2 View  Result Date: 11/26/2018 CLINICAL DATA:  Left lower quadrant pain EXAM: CHEST - 2 VIEW COMPARISON:  08/22/2018 FINDINGS: Borderline cardiomegaly with tortuous atherosclerotic aorta.  Emphysematous hyperinflation of the lungs with faint subpleural areas of fine reticular lung markings that may represent areas of  fibrosis. Small posterior pleural effusions have slightly increased since prior. Degenerative change is noted along the dorsal spine. IMPRESSION: Slight interval increase in posterior small bilateral pleural effusions. Mild fine interstitial lung markings bilaterally that may reflect fibrosis. Electronically Signed   By: Ashley Royalty M.D.   On: 11/26/2018 04:02    Procedures Procedures (including critical care time)  Medications Ordered in ED Medications - No data to display   Initial Impression / Assessment and Plan / ED Course  I have reviewed the triage vital signs and the nursing notes.  Pertinent labs & imaging results that were available during my care of the patient were reviewed by me and considered in my medical decision making (see chart for details).    Lower abdominal pain, flank pain and back pain rating down his left leg.  History of AAA repair as well as recent urinary retention with Foley catheter in place as well as retroperitoneal mass seen on CT scan.  Postvoid residual is 0.  Foley catheter appears to be draining appropriately.  Labs show stable creatinine from December 30 at 2.5.  Calcium consistent with previous values. Corrected calcium 6.7. UA infected. Culture from 12/30 growing enterococcus. Rocephin given as well as vancomycin.  CT today looks stable compared to December 30.  Foley catheter advanced per recommendations.  Discussed with patient son Cecilie Lowers on the phone.  Cecilie Lowers understands that patient has had lower abdominal pain and back pain for quite some time though he does not normally complain of pain going down his leg.  This is been new in the past several days.  He has a history of a "bad back" but never had surgery. Discussed with patient son that ideally would like to obtain CT angiogram to fully evaluate aorta but patient's  creatinine precludes this.  Suspicion for dissection is lower as patient has been having ongoing pain for the past 3 days, has equal lower extremity pulses and strength and has equal upper extremity blood pressures. Cecilie Lowers agrees that he would like to avoid IV contrast at this time. Aortic dissection seems less likely at this time.  Cecilie Lowers says he is leaving town today.  His number is [phone].  He states his brother Lennette Bihari will be available today.  His number is [phone].  Potential admission discussed with Dr. Myna Hidalgo.  He does not feel patient needs to be admitted as his imaging appears to be stable and has urinary tract infection that can be treated with p.o. antibiotics. He wishes urology to be consulted before he agrees to admission.  Discussed with Dr. Jeffie Pollock of urology.  He is unable to review patient's CT scan at this time but states he will be able to consult on patient if he is admitted to the hospitalist service.  Patient remains unable to ambulate.  Complains of pain in his back and his left leg.  He be given additional doses of pain medication. Will also given IV calcium as this may be contributing to his weakness. CK normal. Suspect his back and flank pain is multifactorial likely due to UTI, possibly his retroperitoneal mass and possibly radiculopathy.  Observation admission discussed with Dr. Loleta Books.  Final Clinical Impressions(s) / ED Diagnoses   Final diagnoses:  Acute left-sided low back pain with left-sided sciatica  Urinary tract infection associated with indwelling urethral catheter, initial encounter [facility])  Retroperitoneal mass    ED Discharge Orders    None       Ezequiel Essex, MD 11/26/18 [phone]

## 2018-11-26 NOTE — ED Notes (Signed)
Pt could not pull himself up in bed. Warm blanket given

## 2018-11-26 NOTE — ED Notes (Signed)
Patient transported to X-ray 

## 2018-11-26 NOTE — ED Triage Notes (Signed)
Per EMS: pt from home. Reports LLQ pain radiating down his leg since his foley cath was placed on 12.30.19.  Scheduled to have the cath removed 1.7.20 but cannot wait that long.  Pt A/O.  VSS

## 2018-11-26 NOTE — Progress Notes (Signed)
Patient suffers from L flank pain which impairs their ability to perform daily activities like walking in the home.  A walker alone will not resolve the issues with performing activities of daily living. A wheelchair will allow patient to safely perform daily activities.  The patient can self propel in the home or has a caregiver who can provide assistance.     Blondell Reveal Kistler PT 11/26/2018  Acute Rehabilitation Services Pager [phone] Office [phone]

## 2018-11-27 DIAGNOSIS — I313 Pericardial effusion (noninflammatory): Secondary | ICD-10-CM | POA: Diagnosis not present

## 2018-11-27 DIAGNOSIS — D72829 Elevated white blood cell count, unspecified: Secondary | ICD-10-CM | POA: Diagnosis not present

## 2018-11-27 DIAGNOSIS — H3530 Unspecified macular degeneration: Secondary | ICD-10-CM | POA: Diagnosis present

## 2018-11-27 DIAGNOSIS — N390 Urinary tract infection, site not specified: Secondary | ICD-10-CM | POA: Diagnosis present

## 2018-11-27 DIAGNOSIS — Z8679 Personal history of other diseases of the circulatory system: Secondary | ICD-10-CM | POA: Diagnosis not present

## 2018-11-27 DIAGNOSIS — D649 Anemia, unspecified: Secondary | ICD-10-CM | POA: Diagnosis present

## 2018-11-27 DIAGNOSIS — I482 Chronic atrial fibrillation, unspecified: Secondary | ICD-10-CM | POA: Diagnosis present

## 2018-11-27 DIAGNOSIS — J90 Pleural effusion, not elsewhere classified: Secondary | ICD-10-CM | POA: Diagnosis not present

## 2018-11-27 DIAGNOSIS — E860 Dehydration: Secondary | ICD-10-CM | POA: Diagnosis not present

## 2018-11-27 DIAGNOSIS — M5442 Lumbago with sciatica, left side: Secondary | ICD-10-CM | POA: Diagnosis present

## 2018-11-27 DIAGNOSIS — N401 Enlarged prostate with lower urinary tract symptoms: Secondary | ICD-10-CM | POA: Diagnosis present

## 2018-11-27 DIAGNOSIS — H9190 Unspecified hearing loss, unspecified ear: Secondary | ICD-10-CM | POA: Diagnosis present

## 2018-11-27 DIAGNOSIS — N179 Acute kidney failure, unspecified: Secondary | ICD-10-CM | POA: Diagnosis present

## 2018-11-27 DIAGNOSIS — E872 Acidosis: Secondary | ICD-10-CM | POA: Diagnosis present

## 2018-11-27 DIAGNOSIS — I130 Hypertensive heart and chronic kidney disease with heart failure and stage 1 through stage 4 chronic kidney disease, or unspecified chronic kidney disease: Secondary | ICD-10-CM | POA: Diagnosis present

## 2018-11-27 DIAGNOSIS — I5023 Acute on chronic systolic (congestive) heart failure: Secondary | ICD-10-CM | POA: Diagnosis present

## 2018-11-27 DIAGNOSIS — E039 Hypothyroidism, unspecified: Secondary | ICD-10-CM | POA: Diagnosis present

## 2018-11-27 DIAGNOSIS — M5440 Lumbago with sciatica, unspecified side: Secondary | ICD-10-CM | POA: Diagnosis not present

## 2018-11-27 DIAGNOSIS — R109 Unspecified abdominal pain: Secondary | ICD-10-CM | POA: Diagnosis present

## 2018-11-27 DIAGNOSIS — M549 Dorsalgia, unspecified: Secondary | ICD-10-CM | POA: Diagnosis not present

## 2018-11-27 DIAGNOSIS — D631 Anemia in chronic kidney disease: Secondary | ICD-10-CM | POA: Diagnosis present

## 2018-11-27 DIAGNOSIS — K6812 Psoas muscle abscess: Secondary | ICD-10-CM | POA: Diagnosis present

## 2018-11-27 DIAGNOSIS — E43 Unspecified severe protein-calorie malnutrition: Secondary | ICD-10-CM | POA: Diagnosis present

## 2018-11-27 DIAGNOSIS — N184 Chronic kidney disease, stage 4 (severe): Secondary | ICD-10-CM | POA: Diagnosis present

## 2018-11-27 DIAGNOSIS — Z515 Encounter for palliative care: Secondary | ICD-10-CM | POA: Diagnosis not present

## 2018-11-27 DIAGNOSIS — A4152 Sepsis due to Pseudomonas: Secondary | ICD-10-CM | POA: Diagnosis present

## 2018-11-27 DIAGNOSIS — M1990 Unspecified osteoarthritis, unspecified site: Secondary | ICD-10-CM | POA: Diagnosis present

## 2018-11-27 DIAGNOSIS — G629 Polyneuropathy, unspecified: Secondary | ICD-10-CM | POA: Diagnosis present

## 2018-11-27 DIAGNOSIS — Z66 Do not resuscitate: Secondary | ICD-10-CM | POA: Diagnosis not present

## 2018-11-27 DIAGNOSIS — Y846 Urinary catheterization as the cause of abnormal reaction of the patient, or of later complication, without mention of misadventure at the time of the procedure: Secondary | ICD-10-CM | POA: Diagnosis present

## 2018-11-27 DIAGNOSIS — E559 Vitamin D deficiency, unspecified: Secondary | ICD-10-CM | POA: Diagnosis present

## 2018-11-27 DIAGNOSIS — N183 Chronic kidney disease, stage 3 (moderate): Secondary | ICD-10-CM | POA: Diagnosis not present

## 2018-11-27 DIAGNOSIS — R1900 Intra-abdominal and pelvic swelling, mass and lump, unspecified site: Secondary | ICD-10-CM | POA: Diagnosis not present

## 2018-11-27 DIAGNOSIS — T83511A Infection and inflammatory reaction due to indwelling urethral catheter, initial encounter: Secondary | ICD-10-CM | POA: Diagnosis present

## 2018-11-27 LAB — CBC
HCT: 22 % — ABNORMAL LOW (ref 39.0–52.0)
Hemoglobin: 6.9 g/dL — CL (ref 13.0–17.0)
MCH: 31.7 pg (ref 26.0–34.0)
MCHC: 31.4 g/dL (ref 30.0–36.0)
MCV: 100.9 fL — ABNORMAL HIGH (ref 80.0–100.0)
Platelets: 154 10*3/uL (ref 150–400)
RBC: 2.18 MIL/uL — ABNORMAL LOW (ref 4.22–5.81)
RDW: 18.3 % — AB (ref 11.5–15.5)
WBC: 10.9 10*3/uL — ABNORMAL HIGH (ref 4.0–10.5)
nRBC: 0.2 % (ref 0.0–0.2)

## 2018-11-27 LAB — RENAL FUNCTION PANEL
Albumin: 2 g/dL — ABNORMAL LOW (ref 3.5–5.0)
Anion gap: 10 (ref 5–15)
BUN: 44 mg/dL — AB (ref 8–23)
CO2: 13 mmol/L — ABNORMAL LOW (ref 22–32)
Calcium: 5.2 mg/dL — CL (ref 8.9–10.3)
Chloride: 115 mmol/L — ABNORMAL HIGH (ref 98–111)
Creatinine, Ser: 2.68 mg/dL — ABNORMAL HIGH (ref 0.61–1.24)
GFR calc Af Amer: 24 mL/min — ABNORMAL LOW (ref >60)
GFR calc non Af Amer: 21 mL/min — ABNORMAL LOW (ref >60)
Glucose, Bld: 190 mg/dL — ABNORMAL HIGH (ref 70–99)
Phosphorus: 4.3 mg/dL (ref 2.5–4.6)
Potassium: 3.8 mmol/L (ref 3.5–5.1)
Sodium: 138 mmol/L (ref 135–145)

## 2018-11-27 LAB — PREPARE RBC (CROSSMATCH)

## 2018-11-27 MED ORDER — SODIUM CHLORIDE 0.9 % IV SOLN
510.0000 mg | Freq: Once | INTRAVENOUS | Status: AC
  Administered 2018-11-27: 510 mg via INTRAVENOUS
  Filled 2018-11-27: qty 17

## 2018-11-27 MED ORDER — CALCIUM CARBONATE-VITAMIN D 500-200 MG-UNIT PO TABS
2.0000 | ORAL_TABLET | Freq: Two times a day (BID) | ORAL | Status: DC
  Administered 2018-11-27 – 2018-12-10 (×26): 2 via ORAL
  Filled 2018-11-27 (×26): qty 2

## 2018-11-27 MED ORDER — SODIUM CHLORIDE 0.9% IV SOLUTION
Freq: Once | INTRAVENOUS | Status: AC
  Administered 2018-11-30: 14:00:00 via INTRAVENOUS

## 2018-11-27 NOTE — Progress Notes (Signed)
PROGRESS NOTE    [name].  OXB:[id number] DOB: December 19, 1930 DOA: 11/26/2018 PCP: Mackie Pai, PA-C    Brief Narrative:85 y.o. M with hx blindness, CKD IV baseline Cr 2.2-2.5, Afib not on AC, hx AAA repair 2011, hypothyoridism, BPH requiring I/O cath, and recent recurrent UTI once requiring PICC and meropenem who presents with left sided abdominal pain, back pain and pain radiating into the left leg with numbness for 2 days.  The patient had been well since his last UTI until about a week ago, he developed new malaise, abdominal pain, and fatigue.  Was seen in the ER, diagnosed with urinary retention, and mild AKI, given IV fluids and discharged.  Urine culture at that time grew Enterococcus, patient's primary urologist recommended no treatment as he had no dysuria, urinary urgency.  He was noted to have a new left psoas mass, sarcoma vs renal mass vs abscess and was referred by his Urologist the next day for percutaneous biopsy.  Since then, the patient has developed worsening left sided pain to the point that he can no longer stand, transfer or walk.  His abdominal pain has now progressed into the legs, primarily the left leg, and he has numbness in the left leg.  No fever, confusion, syncope, dysuria.  ED course: - Afebrile, heart rate 47, respirations and pulse ox normal, blood pressure 120/76 -Na 137, K 4.1, Cr 2.5 (baseline 2.5), WBC 12.8K, Hgb 8.6 - Transaminases normal - Calcium 5.4 Albumin 2.5 - Bicarb 12, anion gap normal CK 246 -ECG showed atrial fibrillation, rate 122 -Lipase 16 -Lactate normal -Urinalysis many bacteria -She was given vancomycin and ceftriaxone in the hospital service were asked to evaluate for dehydration, flank pain  11/27/2018 patient seen and examined with RN in the room.  Patient reports left lower extremity weakness more than the right for the last few days.  Able to raise her right leg up but not able to raise the left leg up completely or  actively. Assessment & Plan:   Principal Problem:   Dehydration Active Problems:   Hypothyroid   CKD (chronic kidney disease), stage III (HCC)   Hypocalcemia   Urinary tract infection associated with indwelling urethral catheter (HCC)   Back pain   Retroperitoneal mass  #1 retroperitoneal mass MRI shows 8 x 6 x 7 cm retroperitoneal mass involving the left psoas muscle new since September 2019 favoring infectious versus inflammatory etiology possibly an abscess.  Aggressive neoplasm such as retroperitoneal sarcoma is considered less likely given the degree of rapid progression.  Scheduled for percutaneous drainage of the abscess by IR.  #2 anemia of chronic renal disease patient reports that she was told he has anemia in the past but has not done anything about it.  Blood transfusion ordered.  Will give a dose of Feraheme.  Continue sodium bicarbonate 650 mg twice a day.  #3 BPH continue Flomax  #4 chronic atrial fibrillation on Lopressor 25 mg twice a day rate controlled.  #5 hypertension stable continue metoprolol   #6 hypothyroidism continue Synthroid  #7 UTI on ampicillin follow-up cultures  #8 left lower extremity weakness no evidence of stroke question if it is related to the left ileo-psoas abscess pressing on the nerve structures    Estimated body mass index is 24.84 kg/m as calculated from the following:   Height as of this encounter: 5' 8.5" (1.74 m).   Weight as of this encounter: 75.2 kg.  DVT prophylaxis: SCD Code Status full code Family Communication:  None Disposition Plan: Pending clinical improvement patient lives alone and is nearly blind legally blind will get PT evaluation Consultants:  None  Procedures: None Antimicrobials: Ampicillin Subjective: Worried a lot of complaints he reports no one told him anything in the past about his medical issues suddenly he come to find out he has all these medical problems and is upset.   Objective: Vitals:    11/27/18 0722 11/27/18 0904 11/27/18 0919 11/27/18 1123  BP: 102/64 (!) 73/50 (!) 81/49 (!) 83/60  Pulse: (!) 108 81 88 79  Resp: 18 19 18 18   Temp: 99.1 F (37.3 C) 99.1 F (37.3 C) 98.5 F (36.9 C) 99 F (37.2 C)  TempSrc: Oral Oral Oral Oral  SpO2: 99% 99% 100% 99%  Weight:      Height:        Intake/Output Summary (Last 24 hours) at 11/27/2018 1251 Last data filed at 11/27/2018 1123 Gross per 24 hour  Intake 1574.79 ml  Output 900 ml  Net 674.79 ml   Filed Weights   11/26/18 0208  Weight: 75.2 kg    Examination:  General exam: Appears calm and comfortable  Respiratory system: Clear to auscultation. Respiratory effort normal. Cardiovascular system: S1 & S2 heard, RRR. No JVD, murmurs, rubs, gallops or clicks. No pedal edema. Gastrointestinal system: Abdomen is nondistended, soft and tender left CVA left side of the abdomen No organomegaly or masses felt. Normal bowel sounds heard. Central nervous system: Alert and oriented. No focal neurological deficits. Extremities: Symmetric 5 x 5 power. Skin: No rashes, lesions or ulcers Psychiatry: Judgement and insight appear normal. Mood & affect appropriate.     Data Reviewed: I have personally reviewed following labs and imaging studies  CBC: Recent Labs  Lab 11/26/18 0216 11/27/18 0558  WBC 12.8* 10.9*  NEUTROABS 10.2*  --   HGB 8.6* 6.9*  HCT 27.5* 22.0*  MCV 100.4* 100.9*  PLT 176 962   Basic Metabolic Panel: Recent Labs  Lab 11/26/18 0216 11/27/18 0558  NA 137 138  K 4.1 3.8  CL 115* 115*  CO2 12* 13*  GLUCOSE 105* 190*  BUN 46* 44*  CREATININE 2.54* 2.68*  CALCIUM 5.4* 5.2*  PHOS  --  4.3   GFR: Estimated Creatinine Clearance: 19.8 mL/min (A) (by C-G formula based on SCr of 2.68 mg/dL (H)). Liver Function Tests: Recent Labs  Lab 11/26/18 0216 11/27/18 0558  AST 16  --   ALT 9  --   ALKPHOS 80  --   BILITOT 0.7  --   PROT 6.1*  --   ALBUMIN 2.5* 2.0*   Recent Labs  Lab 11/26/18 0216    LIPASE 16   No results for input(s): AMMONIA in the last 168 hours. Coagulation Profile: No results for input(s): INR, PROTIME in the last 168 hours. Cardiac Enzymes: Recent Labs  Lab 11/26/18 0216  CKTOTAL 246   BNP (last 3 results) Recent Labs    08/22/18 1224  PROBNP 371.0*   HbA1C: No results for input(s): HGBA1C in the last 72 hours. CBG: No results for input(s): GLUCAP in the last 168 hours. Lipid Profile: No results for input(s): CHOL, HDL, LDLCALC, TRIG, CHOLHDL, LDLDIRECT in the last 72 hours. Thyroid Function Tests: No results for input(s): TSH, T4TOTAL, FREET4, T3FREE, THYROIDAB in the last 72 hours. Anemia Panel: No results for input(s): VITAMINB12, FOLATE, FERRITIN, TIBC, IRON, RETICCTPCT in the last 72 hours. Sepsis Labs: Recent Labs  Lab 11/26/18 0555  LATICACIDVEN 0.72    Recent  Results (from the past 240 hour(s))  Urine culture     Status: Abnormal   Collection Time: 11/19/18  4:40 PM  Result Value Ref Range Status   Specimen Description   Final    URINE, CATHETERIZED Performed at [facility], [address] [place], [place] [address]    Special Requests   Final    Normal Performed at [facility], [address] [place], [place] [address]    Culture >=100,000 COLONIES/mL ENTEROCOCCUS FAECALIS (A)  Final   Report Status 11/22/2018 FINAL  Final   Organism ID, Bacteria ENTEROCOCCUS FAECALIS (A)  Final      Susceptibility   Enterococcus faecalis - MIC*    AMPICILLIN <=2 SENSITIVE Sensitive     LEVOFLOXACIN 1 SENSITIVE Sensitive     NITROFURANTOIN <=16 SENSITIVE Sensitive     VANCOMYCIN 1 SENSITIVE Sensitive     * >=100,000 COLONIES/mL ENTEROCOCCUS FAECALIS  Urine Culture     Status: Abnormal (Preliminary result)   Collection Time: 11/26/18  3:42 AM  Result Value Ref Range Status   Specimen Description   Final    URINE, RANDOM Performed at [facility], [address] [place], [place] [address]     Special Requests   Final    NONE Performed at [facility], [address] [place], [place] [address]    Culture (A)  Final    >=100,000 COLONIES/mL UNIDENTIFIED ORGANISM Performed at [facility], [place] [address] [place], [place] [address]    Report Status PENDING  Incomplete  Blood culture (routine x 2)     Status: None (Preliminary result)   Collection Time: 11/26/18  5:30 AM  Result Value Ref Range Status   Specimen Description   Final    BLOOD RIGHT ANTECUBITAL Performed at [facility], [address] [place], [place] [address]    Special Requests   Final    BOTTLES DRAWN AEROBIC AND ANAEROBIC Blood Culture adequate volume Performed at [facility], [address] [place], [place] [address]    Culture   Final    NO GROWTH < 24 HOURS Performed at [facility], [place] [address], [place], [place] [address]    Report Status PENDING  Incomplete  Blood culture (routine x 2)     Status: None (Preliminary result)   Collection Time: 11/26/18  5:45 AM  Result Value Ref Range Status   Specimen Description   Final    BLOOD LEFT WRIST Performed at [facility], [address] [place], [place] [address]    Special Requests   Final    BOTTLES DRAWN AEROBIC AND ANAEROBIC Blood Culture adequate volume Performed at [facility], [address] [place], [place] [address]    Culture   Final    NO GROWTH < 24 HOURS Performed at [facility], [place] [address] [place], [place] [address]    Report Status PENDING  Incomplete         Radiology Studies: Ct Abdomen Pelvis Wo Contrast  Result Date: 11/26/2018 CLINICAL DATA:  Left lower quadrant pain radiating down leg since Foley catheter placement on 11/19/2018 EXAM: CT ABDOMEN AND PELVIS WITHOUT CONTRAST TECHNIQUE: Multidetector CT imaging of the abdomen and pelvis was performed following the standard protocol without IV contrast. COMPARISON:  11/19/2018 CT  FINDINGS: Lower chest: Slightly larger dependent posterior pleural effusions bilaterally.  The included heart size is top-normal with coronary arteriosclerosis and mitral annular calcifications present. Hepatobiliary: Stable low-density cyst in the medial left hepatic lobe measuring 2.7 cm, unchanged in appearance. No biliary dilatation. Gallbladder is physiologically distended without radiopaque calculi. Pancreas: Pancreatic head cystic mass is redemonstrated likely representing a large pseudocyst from prior pancreatitis given pancreatic gland calcifications in the pancreatic head. This measures approximately 6.9 x 4 9 cm and is stable as well. Unopacified duodenum is seen adjacent to it. The remainder of the pancreatic gland is atrophic. No dilatation or acute inflammation. Spleen: Normal Adrenals/Urinary Tract: Normal bilateral adrenal glands. Simple and complex cysts of both kidneys are redemonstrated without nephrolithiasis nor obstructive uropathy. Retroperitoneal masslike abnormality on the left is redemonstrated with scattered areas of internal fat. Differential considerations remain and iliopsoas liposarcoma, exophytic renal mass arising off the lower pole, less likely adenopathy. The urinary bladder is difficult to visualized due to metallic streak artifacts from the patient's hip arthroplasty. Does appear that the Foley balloon catheter may be deployed partially within the prostatic gland along the prostatic urethra repositioning is suggested. Stomach/Bowel: Small hiatal hernia. Physiologic distention of the stomach and small bowel. Moderate stool retention within the colon. No definite bowel obstruction or inflammation. Vascular/Lymphatic: Status post surgical graft repair of the abdominal aorta without complicating features. No pathologically enlarged lymphadenopathy. Reproductive: Top-normal size prostate Other: No pneumoperitoneum, ascites or focal fluid collection. Musculoskeletal: Moderate  thoracolumbar spondylosis. Left hip arthroplasty without complicating features. IMPRESSION: 1. The urinary bladder is difficult to visualized due to metallic streak artifacts from the patient's hip arthroplasty. It does appear that the Foley balloon catheter may be deployed partially within the prostatic gland within the expected location of the prostatic urethra and therefore repositioning is suggested. 2. Stable cystic mass in the pancreatic head measuring 6.9 x 4 9 cm likely representing a large pseudocyst from prior pancreatitis. 3. Stable low-density cyst in the medial left hepatic lobe measuring 2.7 cm. 4. Retroperitoneal masslike abnormality on the left with scattered areas of internal macroscopic fat. Differential considerations remain iliopsoas liposarcoma, exophytic renal mass arising off the lower pole, less likely adenopathy among some considerations. Electronically Signed   By: Ashley Royalty M.D.   On: 11/26/2018 04:01   Dg Chest 2 View  Result Date: 11/26/2018 CLINICAL DATA:  Left lower quadrant pain EXAM: CHEST - 2 VIEW COMPARISON:  08/22/2018 FINDINGS: Borderline cardiomegaly with tortuous atherosclerotic aorta. Emphysematous hyperinflation of the lungs with faint subpleural areas of fine reticular lung markings that may represent areas of fibrosis. Small posterior pleural effusions have slightly increased since prior. Degenerative change is noted along the dorsal spine. IMPRESSION: Slight interval increase in posterior small bilateral pleural effusions. Mild fine interstitial lung markings bilaterally that may reflect fibrosis. Electronically Signed   By: Ashley Royalty M.D.   On: 11/26/2018 04:02   Ct Head Wo Contrast  Result Date: 11/26/2018 CLINICAL DATA:  Altered level of consciousness. Anisocoria. EXAM: CT HEAD WITHOUT CONTRAST TECHNIQUE: Contiguous axial images were obtained from the base of the skull through the vertex without intravenous contrast. COMPARISON:  08/07/2013 FINDINGS: Brain:  Chronic generalized atrophy. Chronic small-vessel ischemic changes extensively present within the pons. No focal cerebellar insult. Cerebral hemispheres show chronic small-vessel ischemic change of the white matter. No large vessel territory infarction. No mass lesion, hemorrhage, hydrocephalus or extra-axial collection. Vascular: There is atherosclerotic calcification of the major vessels at the base of the brain. Skull: Negative Sinuses/Orbits: Paranasal sinusitis. Orbits negative. Other: None IMPRESSION: No acute finding  by CT. Generalized atrophy. Chronic small-vessel ischemic changes, extensive affecting the pons. Inflammatory changes of the paranasal sinuses. Electronically Signed   By: Nelson Chimes M.D.   On: 11/26/2018 06:33   Mr Abdomen Wo Contrast  Result Date: 11/26/2018 CLINICAL DATA:  Renal mass on recent CT EXAM: MRI ABDOMEN WITHOUT CONTRAST TECHNIQUE: Multiplanar multisequence MR imaging was performed without the administration of intravenous contrast. COMPARISON:  CT abdomen/pelvis dated 11/26/2018, 11/19/2018, and 07/27/2018 FINDINGS: Motion degraded images. Lower chest: Small bilateral pleural effusions. Associated lower lobe atelectasis. Hepatobiliary: 2.6 cm cyst in segment 4A (series 6/image 12). Gallbladder is unremarkable. No intrahepatic or extrahepatic ductal dilatation. Pancreas: Chronic pseudocyst in the pancreatic head/uncinate process, measuring 4.5 x 5.6 cm, which directly communicates with the posterior gastric antrum and proximal duodenum (series 6/image 22). Atrophy of the pancreatic body/tail, chronic. Known parenchymal calcifications in the uncinate process are not evident on MRI. Spleen:  Subcentimeter splenic cyst. Adrenals/Urinary Tract:  Adrenal glands are within normal limits. Bilateral renal cysts, measuring up to 7.2 cm in the lateral left lower kidney (series 3/image 34). Three right renal cysts demonstrate hemorrhage. Medial left upper kidney is displaced by a  retroperitoneal mass (described below) involving the left psoas muscle. No hydronephrosis. Stomach/Bowel: Stomach is otherwise within normal limits. Visualized bowel is grossly unremarkable. Vascular/Lymphatic: Postsurgical changes involving the abdominal aorta. No evidence of abdominal aortic aneurysm. No suspicious abdominal lymphadenopathy. Other: 8.1 x 6.0 x 7.0 cm retroperitoneal mass abutting/involving the left psoas muscle (series 10/image 43), new from 07/27/2018. No intrinsic T1 hyperintensity to suggest hemorrhage. Given rapid growth, an infectious/inflammatory etiology is favored, possibly reflecting a psoas abscess. While neoplasm such as sarcoma is technically possible, and can be very aggressive, this degree of rapid progression (without an underlying lesion even in retrospect) would be unexpected. Musculoskeletal: No focal osseous lesions. IMPRESSION: Motion degraded images. Evaluation is also limited due to lack of intravenous contrast administration. 8.1 x 6.0 x 7.0 cm retroperitoneal mass abutting/involving the left psoas muscle, new from September 2019, favoring infectious/inflammatory etiology, possibly reflecting a psoas abscess. Aggressive neoplasm such as retroperitoneal sarcoma is considered less likely given the degree of rapid progression. Additional stable ancillary findings as above. Electronically Signed   By: Julian Hy M.D.   On: 11/26/2018 18:13        Scheduled Meds: . sodium chloride   Intravenous Once  . calcium-vitamin D  1 tablet Oral BID  . levothyroxine  88 mcg Oral QAC breakfast  . metoprolol tartrate  25 mg Oral BID  . sodium bicarbonate  650 mg Oral BID  . tamsulosin  0.4 mg Oral QHS  . [START ON 12/02/2018] Vitamin D (Ergocalciferol)  50,000 Units Oral Q7 days   Continuous Infusions: . ampicillin (OMNIPEN) IV 1 g (11/27/18 1140)     LOS: 0 days      Georgette Shell, MD [facility]  If 7PM-7AM, please contact  night-coverage [email or web address] Password [facility] 11/27/2018, 12:51 PM

## 2018-11-27 NOTE — Progress Notes (Signed)
CRITICAL VALUE ALERT  Critical Value:  Calcium 5.2  Date & Time Notied:  11/27/2018 4098  Provider Notified: Raliegh Ip Schorr  Orders Received/Actions taken: Received new orders from prior text including to transfuse 1 Unit RBC.

## 2018-11-27 NOTE — Consult Note (Signed)
Chief Complaint: Patient was seen in consultation today for image guided aspiration and possible drainage of left psoas fluid collection Chief Complaint  Patient presents with  . Abdominal Pain    Referring Physician(s): Danford,C  Supervising Physician: Daryll Brod  Patient Status: [facility] - In-pt  History of Present Illness: [name]. is an 85 y.o. male with hx  blindness, CKD IV baseline Cr 2.2-2.5, Afib not on AC, hx AAA repair 2011, hypothyoridism, BPH requiring I/O cath, and recent recurrent UTI once requiring PICC and meropenem who was admitted to [facility] 11/26/18  with left sided abdominal pain, back pain and pain radiating into the left leg with numbness for 2 days. He had been well since his last UTI until about a week ago, when he developed new malaise, abdominal pain, and fatigue. He was seen in the ER, diagnosed with urinary retention, and mild AKI, given IV fluids and discharged.  Urine culture at that time grew Enterococcus, patient's primary urologist recommended no treatment as he had no dysuria, urinary urgency.  He was noted to have a new left psoas mass, sarcoma vs renal mass vs abscess and was referred by his Urologist the next day for percutaneous biopsy but has not been done. Since then, the patient has developed worsening left sided pain to the point that he can no longer stand, transfer or walk.  His abdominal pain has now progressed into the legs, primarily the left leg, and he has numbness in the left leg.  No fever, confusion, syncope, dysuria.  MRI abdomen performed yesterday revealed left psoas fluid collection,?  abscess versus tumor.  Request now received from hospitalist for image guided aspiration/ possible drainage of left psoas fluid collection.  Current labs include potassium 3.8, creatinine 2.68, calcium 5.2, WBC 10.9, hemoglobin 6.9-has been transfused today, plts 154k, PT/INR pending.  Past Medical History:  Diagnosis Date  . AAA (abdominal aortic  aneurysm) ([place])   . Arthritis   . BPH (benign prostatic hyperplasia)   . Complication of anesthesia    "a tough time"  -pt unable to explain  . Glucose intolerance (impaired glucose tolerance)   . Hypertension   . Hypothyroid   . Pancreatitis 15-20 yrs ago  . Peripheral neuropathy    fingers and toes  . Renal insufficiency     Past Surgical History:  Procedure Laterality Date  . ABDOMINAL AORTAGRAM N/A 05/06/2014   Procedure: ABDOMINAL Maxcine Ham;  Surgeon: Serafina Mitchell, MD;  Location: [facility] CATH LAB;  Service: Cardiovascular;  Laterality: N/A;  . ABDOMINAL AORTIC ANEURYSM REPAIR  july 2011  . AMPUTATION Right 01/03/2013   Procedure: AMPUTATION DIGIT;  Surgeon: Wylene Simmer, MD;  Location: [facility] ORS;  Service: Orthopedics;  Laterality: Right;  RIGHT 2ND TOE AMPUTATION  . HIP PINNING,CANNULATED Left 08/08/2013   Procedure: LEFT CANNULATED HIP PINNING;  Surgeon: Rozanna Box, MD;  Location: [place];  Service: Orthopedics;  Laterality: Left;  . PROSTATE SURGERY  5 yrs ago   urethral dilation  . STERIOD INJECTION Bilateral 08/08/2013   Procedure: STEROID INJECTION;  Surgeon: Rozanna Box, MD;  Location: [place];  Service: Orthopedics;  Laterality: Bilateral;  . TOTAL HIP REVISION Left 02/10/2014   Procedure: REMOVAL OF SYNTHESE SCREWS LEFT HIP AND CONVERSION LEFT TOTAL HIP ARTHROPLASTY;  Surgeon: Mauri Pole, MD;  Location: [facility];  Service: Orthopedics;  Laterality: Left;    Allergies: Aspirin and Shellfish allergy  Medications: Prior to Admission medications   Medication Sig Start Date End  Date Taking? Authorizing Provider  calcium-vitamin D 250-100 MG-UNIT tablet Take 1 tablet by mouth 2 (two) times daily.   Yes [provider]  levothyroxine (SYNTHROID, LEVOTHROID) 88 MCG tablet TAKE 1 TABLET EVERY MORNING Patient taking differently: Take 88 mcg by mouth daily before breakfast.  01/13/16  Yes Denita Lung, MD  sodium bicarbonate 650 MG tablet Take 650 mg by mouth 2 (two)  times daily.   Yes [provider]  tamsulosin (FLOMAX) 0.4 MG CAPS capsule Take 0.4 mg by mouth at bedtime.   Yes [provider]  Vitamin D, Ergocalciferol, (DRISDOL) 50000 UNITS CAPS capsule Take 50,000 Units by mouth every 7 (seven) days. On Sunday   Yes [provider]  meropenem (MERREM) 1 g injection  08/08/18   [provider]  metoprolol tartrate (LOPRESSOR) 25 MG tablet  08/08/18   [provider]     Family History  Problem Relation Age of Onset  . Heart attack Mother   . Heart disease Mother   . Heart attack Father   . Diabetes Son        amputation-BKA     Social History   Socioeconomic History  . Marital status: Single    Spouse name: Not on file  . Number of children: Not on file  . Years of education: Not on file  . Highest education level: Not on file  Occupational History  . Not on file  Social Needs  . Financial resource strain: Not on file  . Food insecurity:    Worry: Not on file    Inability: Not on file  . Transportation needs:    Medical: Not on file    Non-medical: Not on file  Tobacco Use  . Smoking status: Former Smoker    Packs/day: 0.30    Years: 60.00    Pack years: 18.00    Last attempt to quit: 10/02/2012    Years since quitting: 6.1  . Smokeless tobacco: Never Used  Substance and Sexual Activity  . Alcohol use: Yes    Alcohol/week: 0.0 standard drinks    Comment: occasioally  . Drug use: No  . Sexual activity: Not on file  Lifestyle  . Physical activity:    Days per week: Not on file    Minutes per session: Not on file  . Stress: Not on file  Relationships  . Social connections:    Talks on phone: Not on file    Gets together: Not on file    Attends religious service: Not on file    Active member of club or organization: Not on file    Attends meetings of clubs or organizations: Not on file    Relationship status: Not on file  Other Topics Concern  . Not on file  Social History  Narrative  . Not on file      Review of Systems see above; currently without fever, headache, worsening dyspnea, cough, nausea, vomiting or bleeding.  Vital Signs: BP (!) 94/53 (BP Location: Right Arm)   Pulse 82   Temp 98.2 F (36.8 C) (Oral)   Resp 18   Ht 5' 8.5" (1.74 m)   Wt 165 lb 12.6 oz (75.2 kg)   SpO2 98%   BMI 24.84 kg/m   Physical Exam awake, alert.  Chest with slightly diminished breath sounds bases, heart with regular rate and rhythm.  Abdomen soft, positive bowel sounds, mild left lateral abdominal/CVA tenderness to palpation; no lower extremity edema  Imaging: Ct  Abdomen Pelvis Wo Contrast  Result Date: 11/26/2018 CLINICAL DATA:  Left lower quadrant pain radiating down leg since Foley catheter placement on 11/19/2018 EXAM: CT ABDOMEN AND PELVIS WITHOUT CONTRAST TECHNIQUE: Multidetector CT imaging of the abdomen and pelvis was performed following the standard protocol without IV contrast. COMPARISON:  11/19/2018 CT FINDINGS: Lower chest: Slightly larger dependent posterior pleural effusions bilaterally. The included heart size is top-normal with coronary arteriosclerosis and mitral annular calcifications present. Hepatobiliary: Stable low-density cyst in the medial left hepatic lobe measuring 2.7 cm, unchanged in appearance. No biliary dilatation. Gallbladder is physiologically distended without radiopaque calculi. Pancreas: Pancreatic head cystic mass is redemonstrated likely representing a large pseudocyst from prior pancreatitis given pancreatic gland calcifications in the pancreatic head. This measures approximately 6.9 x 4 9 cm and is stable as well. Unopacified duodenum is seen adjacent to it. The remainder of the pancreatic gland is atrophic. No dilatation or acute inflammation. Spleen: Normal Adrenals/Urinary Tract: Normal bilateral adrenal glands. Simple and complex cysts of both kidneys are redemonstrated without nephrolithiasis nor obstructive uropathy.  Retroperitoneal masslike abnormality on the left is redemonstrated with scattered areas of internal fat. Differential considerations remain and iliopsoas liposarcoma, exophytic renal mass arising off the lower pole, less likely adenopathy. The urinary bladder is difficult to visualized due to metallic streak artifacts from the patient's hip arthroplasty. Does appear that the Foley balloon catheter may be deployed partially within the prostatic gland along the prostatic urethra repositioning is suggested. Stomach/Bowel: Small hiatal hernia. Physiologic distention of the stomach and small bowel. Moderate stool retention within the colon. No definite bowel obstruction or inflammation. Vascular/Lymphatic: Status post surgical graft repair of the abdominal aorta without complicating features. No pathologically enlarged lymphadenopathy. Reproductive: Top-normal size prostate Other: No pneumoperitoneum, ascites or focal fluid collection. Musculoskeletal: Moderate thoracolumbar spondylosis. Left hip arthroplasty without complicating features. IMPRESSION: 1. The urinary bladder is difficult to visualized due to metallic streak artifacts from the patient's hip arthroplasty. It does appear that the Foley balloon catheter may be deployed partially within the prostatic gland within the expected location of the prostatic urethra and therefore repositioning is suggested. 2. Stable cystic mass in the pancreatic head measuring 6.9 x 4 9 cm likely representing a large pseudocyst from prior pancreatitis. 3. Stable low-density cyst in the medial left hepatic lobe measuring 2.7 cm. 4. Retroperitoneal masslike abnormality on the left with scattered areas of internal macroscopic fat. Differential considerations remain iliopsoas liposarcoma, exophytic renal mass arising off the lower pole, less likely adenopathy among some considerations. Electronically Signed   By: Ashley Royalty M.D.   On: 11/26/2018 04:01   Ct Abdomen Pelvis Wo  Contrast  Result Date: 11/19/2018 CLINICAL DATA:  Abdominal pain.  Hypotension. EXAM: CT ABDOMEN AND PELVIS WITHOUT CONTRAST TECHNIQUE: Multidetector CT imaging of the abdomen and pelvis was performed following the standard protocol without IV contrast. COMPARISON:  07/27/2018 CT abdomen/pelvis. FINDINGS: Lower chest: Small dependent bilateral pleural effusions. Nonspecific patchy subpleural reticulation throughout both lung bases. Coronary atherosclerosis. Hepatobiliary: Normal liver size. Simple 2.6 cm left liver lobe cyst. No additional liver lesions. Normal gallbladder with no radiopaque cholelithiasis. No biliary ductal dilatation. Pancreas: There is a cystic 7.1 x 5.9 cm mass centered at the pancreatic head (series 2/image 33), which contains a few tiny internal foci of gas, not substantially changed since 10/22/2015 CT and present since 04/17/2007 CT, suspect a chronic pancreatic pseudocyst. Otherwise diffuse pancreatic parenchymal atrophy with no new pancreatic lesions and no pancreatic duct dilation, unchanged in the interval. Spleen:  Normal size. No mass. Adrenals/Urinary Tract: Normal adrenals. There is a mixed attenuation 6.7 x 4.6 cm medial upper left retroperitoneal mass (series 2/image 29), which abuts the medial upper left kidney, and which is new since 07/27/2018 CT, and which contains scattered internal regions of macroscopic fat density. This mass is contiguous with the upper left psoas muscle. No hydronephrosis. No renal stones. Numerous simple and minimally complex renal cysts in both kidneys, the largest an exophytic 6.8 cm simple renal cyst in posterior lower left kidney. Bladder collapsed by indwelling Foley catheter. Bladder poorly evaluated due to streak artifact from left hip hardware. Stomach/Bowel: Nondistended stomach without definite acute gastric abnormality. Normal caliber small bowel with no small bowel wall thickening. Normal appendix. Normal large bowel with no diverticulosis,  large bowel wall thickening or pericolonic fat stranding. Vascular/Lymphatic: Atherosclerotic nonaneurysmal abdominal aorta status post surgical graft repair. No pathologically enlarged lymph nodes in the abdomen or pelvis. Reproductive: Normal size prostate. Other: No pneumoperitoneum, ascites or focal fluid collection. Musculoskeletal: No aggressive appearing focal osseous lesions. Left total hip arthroplasty. Mild-to-moderate thoracolumbar spondylosis. IMPRESSION: 1. Mixed attenuation 6.7 cm medial upper left retroperitoneal mass between the upper left kidney and upper left psoas muscle with areas of internal fat density, new since 07/27/2018 CT and suspicious for malignancy, with the differential including retroperitoneal liposarcoma versus exophytic renal cell carcinoma. MRI abdomen without and with IV contrast may be useful for further characterization. This mass may be amenable to percutaneous biopsy. 2. No evidence of bowel obstruction or acute bowel inflammation. 3. Small dependent bilateral pleural effusions. 4. Chronic 7.1 cm cystic pancreatic head mass, unchanged since 2016 CT, present since 2008 CT, suspect chronic pancreatic pseudocyst. 5.  Aortic Atherosclerosis (ICD10-I70.0). Electronically Signed   By: Ilona Sorrel M.D.   On: 11/19/2018 20:24   Dg Chest 2 View  Result Date: 11/26/2018 CLINICAL DATA:  Left lower quadrant pain EXAM: CHEST - 2 VIEW COMPARISON:  08/22/2018 FINDINGS: Borderline cardiomegaly with tortuous atherosclerotic aorta. Emphysematous hyperinflation of the lungs with faint subpleural areas of fine reticular lung markings that may represent areas of fibrosis. Small posterior pleural effusions have slightly increased since prior. Degenerative change is noted along the dorsal spine. IMPRESSION: Slight interval increase in posterior small bilateral pleural effusions. Mild fine interstitial lung markings bilaterally that may reflect fibrosis. Electronically Signed   By: Ashley Royalty  M.D.   On: 11/26/2018 04:02   Ct Head Wo Contrast  Result Date: 11/26/2018 CLINICAL DATA:  Altered level of consciousness. Anisocoria. EXAM: CT HEAD WITHOUT CONTRAST TECHNIQUE: Contiguous axial images were obtained from the base of the skull through the vertex without intravenous contrast. COMPARISON:  08/07/2013 FINDINGS: Brain: Chronic generalized atrophy. Chronic small-vessel ischemic changes extensively present within the pons. No focal cerebellar insult. Cerebral hemispheres show chronic small-vessel ischemic change of the white matter. No large vessel territory infarction. No mass lesion, hemorrhage, hydrocephalus or extra-axial collection. Vascular: There is atherosclerotic calcification of the major vessels at the base of the brain. Skull: Negative Sinuses/Orbits: Paranasal sinusitis. Orbits negative. Other: None IMPRESSION: No acute finding by CT. Generalized atrophy. Chronic small-vessel ischemic changes, extensive affecting the pons. Inflammatory changes of the paranasal sinuses. Electronically Signed   By: Nelson Chimes M.D.   On: 11/26/2018 06:33   Mr Abdomen Wo Contrast  Result Date: 11/26/2018 CLINICAL DATA:  Renal mass on recent CT EXAM: MRI ABDOMEN WITHOUT CONTRAST TECHNIQUE: Multiplanar multisequence MR imaging was performed without the administration of intravenous contrast. COMPARISON:  CT abdomen/pelvis dated 11/26/2018,  11/19/2018, and 07/27/2018 FINDINGS: Motion degraded images. Lower chest: Small bilateral pleural effusions. Associated lower lobe atelectasis. Hepatobiliary: 2.6 cm cyst in segment 4A (series 6/image 12). Gallbladder is unremarkable. No intrahepatic or extrahepatic ductal dilatation. Pancreas: Chronic pseudocyst in the pancreatic head/uncinate process, measuring 4.5 x 5.6 cm, which directly communicates with the posterior gastric antrum and proximal duodenum (series 6/image 22). Atrophy of the pancreatic body/tail, chronic. Known parenchymal calcifications in the uncinate  process are not evident on MRI. Spleen:  Subcentimeter splenic cyst. Adrenals/Urinary Tract:  Adrenal glands are within normal limits. Bilateral renal cysts, measuring up to 7.2 cm in the lateral left lower kidney (series 3/image 34). Three right renal cysts demonstrate hemorrhage. Medial left upper kidney is displaced by a retroperitoneal mass (described below) involving the left psoas muscle. No hydronephrosis. Stomach/Bowel: Stomach is otherwise within normal limits. Visualized bowel is grossly unremarkable. Vascular/Lymphatic: Postsurgical changes involving the abdominal aorta. No evidence of abdominal aortic aneurysm. No suspicious abdominal lymphadenopathy. Other: 8.1 x 6.0 x 7.0 cm retroperitoneal mass abutting/involving the left psoas muscle (series 10/image 43), new from 07/27/2018. No intrinsic T1 hyperintensity to suggest hemorrhage. Given rapid growth, an infectious/inflammatory etiology is favored, possibly reflecting a psoas abscess. While neoplasm such as sarcoma is technically possible, and can be very aggressive, this degree of rapid progression (without an underlying lesion even in retrospect) would be unexpected. Musculoskeletal: No focal osseous lesions. IMPRESSION: Motion degraded images. Evaluation is also limited due to lack of intravenous contrast administration. 8.1 x 6.0 x 7.0 cm retroperitoneal mass abutting/involving the left psoas muscle, new from September 2019, favoring infectious/inflammatory etiology, possibly reflecting a psoas abscess. Aggressive neoplasm such as retroperitoneal sarcoma is considered less likely given the degree of rapid progression. Additional stable ancillary findings as above. Electronically Signed   By: Julian Hy M.D.   On: 11/26/2018 18:13    Labs:  CBC: Recent Labs    08/22/18 1224 11/19/18 1556 11/26/18 0216 11/27/18 0558  WBC 6.4 15.7* 12.8* 10.9*  HGB 9.7* 8.6* 8.6* 6.9*  HCT 29.6* 27.3* 27.5* 22.0*  PLT 161.0 207 176 154     COAGS: No results for input(s): INR, APTT in the last 8760 hours.  BMP: Recent Labs    11/19/18 1718 11/19/18 2128 11/26/18 0216 11/27/18 0558  NA 136 134* 137 138  K 4.1 3.7 4.1 3.8  CL 116* 114* 115* 115*  CO2 12* 12* 12* 13*  GLUCOSE 238* 148* 105* 190*  BUN 65* 62* 46* 44*  CALCIUM 5.4* 5.4* 5.4* 5.2*  CREATININE 2.83* 2.57* 2.54* 2.68*  GFRNONAA 19* 22* 22* 21*  GFRAA 23* 25* 26* 24*    LIVER FUNCTION TESTS: Recent Labs    08/22/18 1224 11/19/18 1556 11/19/18 1718 11/26/18 0216 11/27/18 0558  BILITOT 0.6 0.8 0.6 0.7  --   AST 15 18 16 16   --   ALT 14 10 10 9   --   ALKPHOS 80 81 70 80  --   PROT 6.3 6.3* 5.7* 6.1*  --   ALBUMIN 3.2* 2.6* 2.3* 2.5* 2.0*    TUMOR MARKERS: No results for input(s): AFPTM, CEA, CA199, CHROMGRNA in the last 8760 hours.  Assessment and Plan: 85 y.o. male with hx  blindness, CKD IV baseline Cr 2.2-2.5, Afib not on AC, hx AAA repair 2011, hypothyoridism, BPH requiring I/O cath, and recent recurrent UTI once requiring PICC and meropenem who was admitted to [facility] 11/26/18  with left sided abdominal pain, back pain and pain radiating into the left leg  with numbness for 2 days. He had been well since his last UTI until about a week ago, when he developed new malaise, abdominal pain, and fatigue. He was seen in the ER, diagnosed with urinary retention, and mild AKI, given IV fluids and discharged.  Urine culture at that time grew Enterococcus, patient's primary urologist recommended no treatment as he had no dysuria, urinary urgency.  He was noted to have a new left psoas mass, sarcoma vs renal mass vs abscess and was referred by his Urologist the next day for percutaneous biopsy but has not been done. Since then, the patient has developed worsening left sided pain to the point that he can no longer stand, transfer or walk.  His abdominal pain has now progressed into the legs, primarily the left leg, and he has numbness in the left leg.  No fever,  confusion, syncope, dysuria.  MRI abdomen performed yesterday revealed left psoas fluid collection,?  abscess versus tumor.  Request now received from hospitalist for image guided aspiration/ possible drainage of left psoas fluid collection.  Current labs include potassium 3.8, creatinine 2.68, calcium 5.2, WBC 10.9, hemoglobin 6.9-has been transfused today, plts 154k, PT/INR pending.  Imaging studies have been reviewed by Dr. Kathlene Cote.  Details/risks of procedure, including but not limited to, internal bleeding, infection, injury to adjacent structures discussed with patient and son with their understanding and consent.  Procedure tentatively scheduled for 1/8 pending stability of hemoglobin.   Thank you for this interesting consult.  I greatly enjoyed meeting [name]. and look forward to participating in their care.  A copy of this report was sent to the requesting provider on this date.  Electronically Signed: D. Rowe Robert, PA-C 11/27/2018, 2:40 PM   I spent a total of  30 minutes   in face to face in clinical consultation, greater than 50% of which was counseling/coordinating care for image guided left psoas fluid collection aspiration/possible drainage

## 2018-11-27 NOTE — Progress Notes (Signed)
CRITICAL VALUE ALERT  Critical Value:  Hgb 6.9  Date & Time Notied:  11/27/2018  0618  Provider Notified: Lamar Blinks via text Amion  Orders Received/Actions taken:  awaiting acknowledgement.

## 2018-11-27 NOTE — Plan of Care (Signed)
Plan of care discussed. Some frustration expressed at forgetfulness and "blindness".

## 2018-11-28 ENCOUNTER — Encounter (HOSPITAL_COMMUNITY): Payer: Self-pay | Admitting: Radiology

## 2018-11-28 ENCOUNTER — Inpatient Hospital Stay (HOSPITAL_COMMUNITY): Payer: Medicare HMO

## 2018-11-28 LAB — CBC WITH DIFFERENTIAL/PLATELET
Abs Immature Granulocytes: 0.11 10*3/uL — ABNORMAL HIGH (ref 0.00–0.07)
BASOS PCT: 0 %
Basophils Absolute: 0 10*3/uL (ref 0.0–0.1)
Eosinophils Absolute: 0.1 10*3/uL (ref 0.0–0.5)
Eosinophils Relative: 1 %
HCT: 28.8 % — ABNORMAL LOW (ref 39.0–52.0)
Hemoglobin: 9 g/dL — ABNORMAL LOW (ref 13.0–17.0)
Immature Granulocytes: 1 %
LYMPHS ABS: 1.1 10*3/uL (ref 0.7–4.0)
Lymphocytes Relative: 10 %
MCH: 31.5 pg (ref 26.0–34.0)
MCHC: 31.3 g/dL (ref 30.0–36.0)
MCV: 100.7 fL — AB (ref 80.0–100.0)
MONOS PCT: 14 %
Monocytes Absolute: 1.6 10*3/uL — ABNORMAL HIGH (ref 0.1–1.0)
Neutro Abs: 8.3 10*3/uL — ABNORMAL HIGH (ref 1.7–7.7)
Neutrophils Relative %: 74 %
Platelets: 142 10*3/uL — ABNORMAL LOW (ref 150–400)
RBC: 2.86 MIL/uL — ABNORMAL LOW (ref 4.22–5.81)
RDW: 17.9 % — ABNORMAL HIGH (ref 11.5–15.5)
WBC: 11.2 10*3/uL — ABNORMAL HIGH (ref 4.0–10.5)
nRBC: 0 % (ref 0.0–0.2)

## 2018-11-28 LAB — BASIC METABOLIC PANEL
ANION GAP: 11 (ref 5–15)
BUN: 43 mg/dL — ABNORMAL HIGH (ref 8–23)
CALCIUM: 5.6 mg/dL — AB (ref 8.9–10.3)
CO2: 13 mmol/L — ABNORMAL LOW (ref 22–32)
Chloride: 114 mmol/L — ABNORMAL HIGH (ref 98–111)
Creatinine, Ser: 2.55 mg/dL — ABNORMAL HIGH (ref 0.61–1.24)
GFR calc Af Amer: 26 mL/min — ABNORMAL LOW (ref >60)
GFR, EST NON AFRICAN AMERICAN: 22 mL/min — AB (ref >60)
Glucose, Bld: 90 mg/dL (ref 70–99)
Potassium: 3.9 mmol/L (ref 3.5–5.1)
Sodium: 138 mmol/L (ref 135–145)

## 2018-11-28 LAB — BPAM RBC
Blood Product Expiration Date: 202002042359
ISSUE DATE / TIME: 202001070854
Unit Type and Rh: 5100

## 2018-11-28 LAB — TYPE AND SCREEN
ABO/RH(D): O POS
Antibody Screen: NEGATIVE
Unit division: 0

## 2018-11-28 LAB — URINE CULTURE: Culture: >=100000 — AB

## 2018-11-28 LAB — PTH, INTACT AND CALCIUM
Calcium, Total (PTH): 5.1 mg/dL (ref 8.6–10.2)
PTH: 47 pg/mL (ref 15–65)

## 2018-11-28 LAB — VITAMIN D 25 HYDROXY (VIT D DEFICIENCY, FRACTURES): Vit D, 25-Hydroxy: 22.3 ng/mL — ABNORMAL LOW (ref 30.0–100.0)

## 2018-11-28 LAB — PROTIME-INR
INR: 1.34
Prothrombin Time: 16.4 seconds — ABNORMAL HIGH (ref 11.4–15.2)

## 2018-11-28 LAB — ERYTHROPOIETIN: ERYTHROPOIETIN: 11.1 m[IU]/mL (ref 2.6–18.5)

## 2018-11-28 MED ORDER — CALCIUM GLUCONATE-NACL 1-0.675 GM/50ML-% IV SOLN
1.0000 g | Freq: Once | INTRAVENOUS | Status: AC
  Administered 2018-11-28: 1000 mg via INTRAVENOUS
  Filled 2018-11-28: qty 50

## 2018-11-28 MED ORDER — SODIUM CHLORIDE 0.9 % IV BOLUS
250.0000 mL | Freq: Once | INTRAVENOUS | Status: AC
  Administered 2018-11-28: 250 mL via INTRAVENOUS

## 2018-11-28 MED ORDER — FENTANYL CITRATE (PF) 100 MCG/2ML IJ SOLN
INTRAMUSCULAR | Status: AC | PRN
  Administered 2018-11-28: 25 ug via INTRAVENOUS

## 2018-11-28 MED ORDER — MIDAZOLAM HCL 2 MG/2ML IJ SOLN
INTRAMUSCULAR | Status: AC | PRN
  Administered 2018-11-28: 1 mg via INTRAVENOUS

## 2018-11-28 MED ORDER — SODIUM CHLORIDE 0.9% FLUSH
5.0000 mL | Freq: Three times a day (TID) | INTRAVENOUS | Status: DC
  Administered 2018-11-28 – 2018-12-10 (×23): 5 mL

## 2018-11-28 MED ORDER — LIDOCAINE HCL (PF) 1 % IJ SOLN
INTRAMUSCULAR | Status: AC | PRN
  Administered 2018-11-28: 10 mL

## 2018-11-28 MED ORDER — FENTANYL CITRATE (PF) 100 MCG/2ML IJ SOLN
INTRAMUSCULAR | Status: AC
  Filled 2018-11-28: qty 2

## 2018-11-28 MED ORDER — SODIUM CHLORIDE 0.9 % IV SOLN
INTRAVENOUS | Status: DC
  Administered 2018-11-28 – 2018-11-30 (×5): via INTRAVENOUS

## 2018-11-28 MED ORDER — MIDAZOLAM HCL 2 MG/2ML IJ SOLN
INTRAMUSCULAR | Status: AC
  Filled 2018-11-28: qty 4

## 2018-11-28 NOTE — Progress Notes (Signed)
PROGRESS NOTE    [name].  JKD:[id number] DOB: October 12, 1931 DOA: 11/26/2018 PCP: Mackie Pai, PA-C   Brief Narrative::85 y.o.Mwith hx blindness, CKDIV baseline Cr 2.2-2.5, Afib not on AC, hx AAA repair 2011, hypothyoridism, BPH requiring I/O cath, and recent recurrent UTI once requiring PICC and meropenemwho presents with left sided abdominal pain, back pain and pain radiating into the left leg with numbness for 2 days.  The patient had been well since his last UTI until about a week ago, he developednew malaise, abdominal pain, and fatigue. Was seen in the ER, diagnosed with urinary retention, and mild AKI, given IV fluids and discharged.Urine culture at that time grew Enterococcus, patient's primary urologist recommended no treatment as he had no dysuria, urinary urgency. He was noted to have a new left psoas mass, sarcoma vs renal mass vs abscess and was referred by his Urologist the next day for percutaneous biopsy.  Since then, the patient has developed worsening left sided pain to the point that he can no longer stand, transfer or walk. His abdominal pain has now progressed into the legs, primarily the left leg, and he has numbness in the left leg. No fever, confusion, syncope, dysuria. ED course: -Afebrile, heart rate 47, respirations and pulse ox normal, blood pressure 120/76 -Na137, K4.1, Cr2.5(baseline 2.5), WBC12.8K, Hgb8.6 -Transaminases normal -Calcium 5.4 Albumin 2.5 -Bicarb 12, anion gap normal CK 246 -ECG showed atrial fibrillation, rate 122 -Lipase 16 -Lactate normal -Urinalysis many bacteria -She was given vancomycin and ceftriaxone in the hospital service were asked to evaluate for dehydration, flank pain  Assessment & Plan:   Principal Problem:   Dehydration Active Problems:   Hypothyroid   CKD (chronic kidney disease), stage III (HCC)   Hypocalcemia   Urinary tract infection associated with indwelling urethral catheter (HCC)  Back pain   Retroperitoneal mass   Anemia  #1 retroperitoneal mass MRI shows 8 x 6 x 7 cm retroperitoneal mass involving the left psoas muscle new since September 2019 favoring infectious versus inflammatory etiology possibly an abscess.  Patient had 30 cc of pus drained from the left psoas abscess.   #2 anemia of chronic renal disease patient reports that she was told he has anemia in the past but has not done anything about it.  Blood transfusion ordered.  Will give a dose of Feraheme.  Continue sodium bicarbonate 650 mg twice a day.  Hemoglobin stable today at 9.0.  #3 BPH continue Flomax  #4 chronic atrial fibrillation on Lopressor 25 mg twice a day rate controlled.  Not on anticoagulation due to fall risk and blindness and patient lives alone.  #5 hypertension blood pressure running low at 85/58 today we will hold antihypertensives and slow hydration.  #6 hypothyroidism continue Synthroid  #7 UTI urine culture positive for enterococcus sensitive to ampicillin continue.    #8 left lower extremity weakness -no evidence of stroke question if it is related to the left ileo-psoas abscess pressing on the nerve structures hopefully his weakness will improve after drainage of the abscess.  Will consult PT.  #9 hypocalcemia secondary to CKD normal PTH low vitamin D will continue calcium and vitamin D supplements.   Estimated body mass index is 24.84 kg/m as calculated from the following:   Height as of this encounter: 5' 8.5" (1.74 m).   Weight as of this encounter: 75.2 kg.  DVT prophylaxis:  Code Status: Full code Family Communication: None available Disposition Plan: Pending clinical improvement at the time of discharge patient  most likely will need SNF placement Consultants: None  Procedures: Left psoas abscess drainage 11/28/2018 by IR Antimicrobials:  Ampicillin Subjective: Resting in bed in no acute distress   Objective: Vitals:   11/28/18 0532 11/28/18 0930 11/28/18  0940 11/28/18 0945  BP: 113/69 101/67 98/71 (!) 85/58  Pulse:  (!) 101 (!) 102 97  Resp: 16 17 15 14   Temp: 98.6 F (37 C)     TempSrc:      SpO2:  98% 99% 100%  Weight:      Height:        Intake/Output Summary (Last 24 hours) at 11/28/2018 1149 Last data filed at 11/28/2018 0500 Gross per 24 hour  Intake 737.21 ml  Output 1400 ml  Net -662.79 ml   Filed Weights   11/26/18 0208  Weight: 75.2 kg    Examination:  General exam: Appears calm and comfortable  Respiratory system: Clear to auscultation. Respiratory effort normal. Cardiovascular system: S1 & S2 heard, RRR. No JVD, murmurs, rubs, gallops or clicks. No pedal edema. Gastrointestinal system: Abdomen is nondistended, soft and nontender. No organomegaly or masses felt. Normal bowel sounds heard. Central nervous system: Alert and oriented. No focal neurological deficits. Extremities: Symmetric 5 x 5 power. Skin: No rashes, lesions or ulcers Psychiatry: Judgement and insight appear normal. Mood & affect appropriate.     Data Reviewed: I have personally reviewed following labs and imaging studies  CBC: Recent Labs  Lab 11/26/18 0216 11/27/18 0558 11/28/18 0554  WBC 12.8* 10.9* 11.2*  NEUTROABS 10.2*  --  8.3*  HGB 8.6* 6.9* 9.0*  HCT 27.5* 22.0* 28.8*  MCV 100.4* 100.9* 100.7*  PLT 176 154 497*   Basic Metabolic Panel: Recent Labs  Lab 11/26/18 0216 11/27/18 0558 11/28/18 0554  NA 137 138 138  K 4.1 3.8 3.9  CL 115* 115* 114*  CO2 12* 13* 13*  GLUCOSE 105* 190* 90  BUN 46* 44* 43*  CREATININE 2.54* 2.68* 2.55*  CALCIUM 5.4* 5.2*  5.1 5.6*  PHOS  --  4.3  --    GFR: Estimated Creatinine Clearance: 20.8 mL/min (A) (by C-G formula based on SCr of 2.55 mg/dL (H)). Liver Function Tests: Recent Labs  Lab 11/26/18 0216 11/27/18 0558  AST 16  --   ALT 9  --   ALKPHOS 80  --   BILITOT 0.7  --   PROT 6.1*  --   ALBUMIN 2.5* 2.0*   Recent Labs  Lab 11/26/18 0216  LIPASE 16   No results for  input(s): AMMONIA in the last 168 hours. Coagulation Profile: Recent Labs  Lab 11/28/18 0554  INR 1.34   Cardiac Enzymes: Recent Labs  Lab 11/26/18 0216  CKTOTAL 246   BNP (last 3 results) Recent Labs    08/22/18 1224  PROBNP 371.0*   HbA1C: No results for input(s): HGBA1C in the last 72 hours. CBG: No results for input(s): GLUCAP in the last 168 hours. Lipid Profile: No results for input(s): CHOL, HDL, LDLCALC, TRIG, CHOLHDL, LDLDIRECT in the last 72 hours. Thyroid Function Tests: No results for input(s): TSH, T4TOTAL, FREET4, T3FREE, THYROIDAB in the last 72 hours. Anemia Panel: No results for input(s): VITAMINB12, FOLATE, FERRITIN, TIBC, IRON, RETICCTPCT in the last 72 hours. Sepsis Labs: Recent Labs  Lab 11/26/18 0555  LATICACIDVEN 0.72    Recent Results (from the past 240 hour(s))  Urine culture     Status: Abnormal   Collection Time: 11/19/18  4:40 PM  Result Value Ref Range Status  Specimen Description   Final    URINE, CATHETERIZED Performed at [facility], [address] [place], [place] [address]    Special Requests   Final    Normal Performed at [facility], [address] [place], [place] [address]    Culture >=100,000 COLONIES/mL ENTEROCOCCUS FAECALIS (A)  Final   Report Status 11/22/2018 FINAL  Final   Organism ID, Bacteria ENTEROCOCCUS FAECALIS (A)  Final      Susceptibility   Enterococcus faecalis - MIC*    AMPICILLIN <=2 SENSITIVE Sensitive     LEVOFLOXACIN 1 SENSITIVE Sensitive     NITROFURANTOIN <=16 SENSITIVE Sensitive     VANCOMYCIN 1 SENSITIVE Sensitive     * >=100,000 COLONIES/mL ENTEROCOCCUS FAECALIS  Urine Culture     Status: Abnormal   Collection Time: 11/26/18  3:42 AM  Result Value Ref Range Status   Specimen Description   Final    URINE, RANDOM Performed at [facility], [address] [place], [place] [address]    Special Requests   Final    NONE Performed at [facility],  [address] [place], [place] [address]    Culture >=100,000 COLONIES/mL ENTEROCOCCUS FAECALIS (A)  Final   Report Status 11/28/2018 FINAL  Final   Organism ID, Bacteria ENTEROCOCCUS FAECALIS (A)  Final      Susceptibility   Enterococcus faecalis - MIC*    AMPICILLIN <=2 SENSITIVE Sensitive     LEVOFLOXACIN 2 SENSITIVE Sensitive     NITROFURANTOIN <=16 SENSITIVE Sensitive     VANCOMYCIN 1 SENSITIVE Sensitive     * >=100,000 COLONIES/mL ENTEROCOCCUS FAECALIS  Blood culture (routine x 2)     Status: None (Preliminary result)   Collection Time: 11/26/18  5:30 AM  Result Value Ref Range Status   Specimen Description   Final    BLOOD RIGHT ANTECUBITAL Performed at [facility], [address] [place], [place] [address]    Special Requests   Final    BOTTLES DRAWN AEROBIC AND ANAEROBIC Blood Culture adequate volume Performed at [facility], [address] [place], [place] [address]    Culture   Final    NO GROWTH 2 DAYS Performed at [facility], [place] [address] [place], [place] [address]    Report Status PENDING  Incomplete  Blood culture (routine x 2)     Status: None (Preliminary result)   Collection Time: 11/26/18  5:45 AM  Result Value Ref Range Status   Specimen Description   Final    BLOOD LEFT WRIST Performed at [facility], [address] [place], [place] [address]    Special Requests   Final    BOTTLES DRAWN AEROBIC AND ANAEROBIC Blood Culture adequate volume Performed at [facility], [address] [place], [place] [address]    Culture   Final    NO GROWTH 2 DAYS Performed at [facility], [address] [address] [place], [place] [address]    Report Status PENDING  Incomplete         Radiology Studies: Mr Abdomen Wo Contrast  Result Date: 11/26/2018 CLINICAL DATA:  Renal mass on recent CT EXAM: MRI ABDOMEN WITHOUT CONTRAST TECHNIQUE: Multiplanar multisequence MR imaging was performed without the  administration of intravenous contrast. COMPARISON:  CT abdomen/pelvis dated 11/26/2018, 11/19/2018, and 07/27/2018 FINDINGS: Motion degraded images. Lower chest: Small bilateral pleural effusions.  Associated lower lobe atelectasis. Hepatobiliary: 2.6 cm cyst in segment 4A (series 6/image 12). Gallbladder is unremarkable. No intrahepatic or extrahepatic ductal dilatation. Pancreas: Chronic pseudocyst in the pancreatic head/uncinate process, measuring 4.5 x 5.6 cm, which directly communicates with the posterior gastric antrum and proximal duodenum (series 6/image 22). Atrophy of the pancreatic body/tail, chronic. Known parenchymal calcifications in the uncinate process are not evident on MRI. Spleen:  Subcentimeter splenic cyst. Adrenals/Urinary Tract:  Adrenal glands are within normal limits. Bilateral renal cysts, measuring up to 7.2 cm in the lateral left lower kidney (series 3/image 34). Three right renal cysts demonstrate hemorrhage. Medial left upper kidney is displaced by a retroperitoneal mass (described below) involving the left psoas muscle. No hydronephrosis. Stomach/Bowel: Stomach is otherwise within normal limits. Visualized bowel is grossly unremarkable. Vascular/Lymphatic: Postsurgical changes involving the abdominal aorta. No evidence of abdominal aortic aneurysm. No suspicious abdominal lymphadenopathy. Other: 8.1 x 6.0 x 7.0 cm retroperitoneal mass abutting/involving the left psoas muscle (series 10/image 43), new from 07/27/2018. No intrinsic T1 hyperintensity to suggest hemorrhage. Given rapid growth, an infectious/inflammatory etiology is favored, possibly reflecting a psoas abscess. While neoplasm such as sarcoma is technically possible, and can be very aggressive, this degree of rapid progression (without an underlying lesion even in retrospect) would be unexpected. Musculoskeletal: No focal osseous lesions. IMPRESSION: Motion degraded images. Evaluation is also limited due to lack of  intravenous contrast administration. 8.1 x 6.0 x 7.0 cm retroperitoneal mass abutting/involving the left psoas muscle, new from September 2019, favoring infectious/inflammatory etiology, possibly reflecting a psoas abscess. Aggressive neoplasm such as retroperitoneal sarcoma is considered less likely given the degree of rapid progression. Additional stable ancillary findings as above. Electronically Signed   By: Julian Hy M.D.   On: 11/26/2018 18:13   Ct Image Guided Drainage Percut Cath  Peritoneal Retroperit  Result Date: 11/28/2018 INDICATION: Left retroperitoneal psoas abscess EXAM: CT DRAINAGE LEFT PSOAS ABSCESS MEDICATIONS: The patient is currently admitted to the hospital and receiving intravenous antibiotics. The antibiotics were administered within an appropriate time frame prior to the initiation of the procedure. ANESTHESIA/SEDATION: Fentanyl 25 mcg IV; Versed 1.0 mg IV Moderate Sedation Time:  14 MINUTES The patient was continuously monitored during the procedure by the interventional radiology nurse under my direct supervision. COMPLICATIONS: None immediate. PROCEDURE: Informed written consent was obtained from the patient after a thorough discussion of the procedural risks, benefits and alternatives. All questions were addressed. Maximal Sterile Barrier Technique was utilized including caps, mask, sterile gowns, sterile gloves, sterile drape, hand hygiene and skin antiseptic. A timeout was performed prior to the initiation of the procedure. Previous imaging reviewed. Patient position prone. Noncontrast localization CT. The left retroperitoneal abscess was localized and marked for a posterior paraspinous approach. Under sterile conditions and local anesthesia, a 17 gauge 11.8 cm access needle was advanced percutaneously into the left psoas abscess. Syringe aspiration yielded purulent fluid. Sample sent for cytology and culture. Guidewire inserted followed by tract dilatation to insert a  pigtail drain. Drain catheter position confirmed within the abscess with CT. Syringe aspiration yielded 30 cc purulent fluid. Catheter connected to external suction bulb. Catheter secured with Prolene suture and a sterile dressing. No immediate complication. Patient tolerated the procedure well. IMPRESSION: Successful CT-guided left psoas abscess 10 French drain placement. Sample sent for cytology and culture. Electronically Signed   By: Jerilynn Mages.  Shick M.D.   On: 11/28/2018 11:08        Scheduled Meds: . sodium chloride   Intravenous Once  .  calcium-vitamin D  2 tablet Oral BID  . fentaNYL      . levothyroxine  88 mcg Oral QAC breakfast  . metoprolol tartrate  25 mg Oral BID  . midazolam      . sodium bicarbonate  650 mg Oral BID  . sodium chloride flush  5 mL Intracatheter Q8H  . tamsulosin  0.4 mg Oral QHS  . [START ON 12/02/2018] Vitamin D (Ergocalciferol)  50,000 Units Oral Q7 days   Continuous Infusions: . ampicillin (OMNIPEN) IV 1 g (11/28/18 0526)  . calcium gluconate       LOS: 1 day     Georgette Shell, MD [facility]  If 7PM-7AM, please contact night-coverage [email or web address] Password [facility] 11/28/2018, 11:49 AM

## 2018-11-28 NOTE — Progress Notes (Signed)
CRITICAL VALUE ALERT  Critical Value:  Ca 5.6  Date & Time Notied:  11/28/2018 at 0730  Provider Notified: Rodena Piety, MD  Orders Received/Actions taken: Awaiting new orders

## 2018-11-28 NOTE — Progress Notes (Signed)
PT Cancellation Note  Patient Details Name: [name]. MRN: [id number] DOB: [birth year]   Cancelled Treatment:    Reason Eval/Treat Not Completed: Pain limiting ability to participate(pt reported he's not feeling up to getting out of bed, he c/o pain, pain meds requested.) Will follow.    Blondell Reveal Kistler PT 11/28/2018  Acute Rehabilitation Services Pager [phone] Office [phone]

## 2018-11-28 NOTE — Procedures (Signed)
Left psoas abscess  S/p CT asp and drain 30cc pus aspirated No comp Stable To suction bulb Full report in pacs

## 2018-11-28 NOTE — Progress Notes (Signed)
Received Call from Lab reporting Critical Calcium PTH intact 5.1 drawn 11/27/2018 for which the Doctor had been informed of, and doubled Patients daily Calcium supplement.  Information will be added to shift report.

## 2018-11-29 ENCOUNTER — Inpatient Hospital Stay (HOSPITAL_COMMUNITY): Payer: Medicare HMO

## 2018-11-29 LAB — CBC
HCT: 25.5 % — ABNORMAL LOW (ref 39.0–52.0)
Hemoglobin: 8.1 g/dL — ABNORMAL LOW (ref 13.0–17.0)
MCH: 31.9 pg (ref 26.0–34.0)
MCHC: 31.8 g/dL (ref 30.0–36.0)
MCV: 100.4 fL — ABNORMAL HIGH (ref 80.0–100.0)
Platelets: 152 10*3/uL (ref 150–400)
RBC: 2.54 MIL/uL — ABNORMAL LOW (ref 4.22–5.81)
RDW: 18.1 % — ABNORMAL HIGH (ref 11.5–15.5)
WBC: 8.8 10*3/uL (ref 4.0–10.5)
nRBC: 0 % (ref 0.0–0.2)

## 2018-11-29 LAB — COMPREHENSIVE METABOLIC PANEL
ALT: 8 U/L (ref 0–44)
AST: 10 U/L — ABNORMAL LOW (ref 15–41)
Albumin: 2 g/dL — ABNORMAL LOW (ref 3.5–5.0)
Alkaline Phosphatase: 59 U/L (ref 38–126)
Anion gap: 10 (ref 5–15)
BUN: 47 mg/dL — ABNORMAL HIGH (ref 8–23)
CO2: 14 mmol/L — ABNORMAL LOW (ref 22–32)
Calcium: 6.1 mg/dL — CL (ref 8.9–10.3)
Chloride: 115 mmol/L — ABNORMAL HIGH (ref 98–111)
Creatinine, Ser: 2.48 mg/dL — ABNORMAL HIGH (ref 0.61–1.24)
GFR, EST AFRICAN AMERICAN: 26 mL/min — AB (ref >60)
GFR, EST NON AFRICAN AMERICAN: 23 mL/min — AB (ref >60)
Glucose, Bld: 94 mg/dL (ref 70–99)
Potassium: 3.7 mmol/L (ref 3.5–5.1)
Sodium: 139 mmol/L (ref 135–145)
Total Bilirubin: 0.9 mg/dL (ref 0.3–1.2)
Total Protein: 4.9 g/dL — ABNORMAL LOW (ref 6.5–8.1)

## 2018-11-29 LAB — MAGNESIUM: MAGNESIUM: 1.1 mg/dL — AB (ref 1.7–2.4)

## 2018-11-29 MED ORDER — MAGNESIUM SULFATE 4 GM/100ML IV SOLN
4.0000 g | Freq: Once | INTRAVENOUS | Status: AC
  Administered 2018-11-29: 4 g via INTRAVENOUS
  Filled 2018-11-29: qty 100

## 2018-11-29 MED ORDER — SODIUM CHLORIDE 0.9 % IV SOLN
INTRAVENOUS | Status: DC
  Administered 2018-11-29: 15:00:00 via INTRAVENOUS

## 2018-11-29 MED ORDER — PIPERACILLIN-TAZOBACTAM 3.375 G IVPB
3.3750 g | Freq: Three times a day (TID) | INTRAVENOUS | Status: DC
  Administered 2018-11-29 – 2018-11-30 (×4): 3.375 g via INTRAVENOUS
  Filled 2018-11-29 (×5): qty 50

## 2018-11-29 MED ORDER — HEPARIN SODIUM (PORCINE) 5000 UNIT/ML IJ SOLN
5000.0000 [IU] | Freq: Three times a day (TID) | INTRAMUSCULAR | Status: DC
  Administered 2018-11-29 – 2018-12-04 (×9): 5000 [IU] via SUBCUTANEOUS
  Filled 2018-11-29 (×19): qty 1

## 2018-11-29 MED ORDER — CALCIUM GLUCONATE-NACL 1-0.675 GM/50ML-% IV SOLN
1.0000 g | Freq: Once | INTRAVENOUS | Status: AC
  Administered 2018-11-29: 1000 mg via INTRAVENOUS
  Filled 2018-11-29: qty 50

## 2018-11-29 NOTE — Progress Notes (Addendum)
Called by RN concerning low BP 74/46. There has been no change in pt condition but after review of chart it is noted that patient has received percocet twice last night while hypotensive and flomax. Will hold pain medications and tamsulosin while pt hypotensive.   Hypotension -Hold flomax and narcotics  - Fluid bolus   Lovey Newcomer, NP [facility], 7p-7a [phone]

## 2018-11-29 NOTE — Progress Notes (Addendum)
PROGRESS NOTE    [name].  QBH:[id number] DOB: Mar 26, 1931 DOA: 11/26/2018 PCP: Mackie Pai, PA-C   Brief Narrative: 85 y.o.Mwith hx blindness, CKDIV baseline Cr 2.2-2.5, Afib not on AC, hx AAA repair 2011, hypothyoridism, BPH requiring I/O cath, and recent recurrent UTI once requiring PICC and meropenemwho presents with left sided abdominal pain, back pain and pain radiating into the left leg with numbness for 2 days.  The patient had been well since his last UTI until about a week ago, he developednew malaise, abdominal pain, and fatigue. Was seen in the ER, diagnosed with urinary retention, and mild AKI, given IV fluids and discharged.Urine culture at that time grew Enterococcus, patient's primary urologist recommended no treatment as he had no dysuria, urinary urgency. He was noted to have a new left psoas mass, sarcoma vs renal mass vs abscess and was referred by his Urologist the next day for percutaneous biopsy.  Since then, the patient has developed worsening left sided pain to the point that he can no longer stand, transfer or walk. His abdominal pain has now progressed into the legs, primarily the left leg, and he has numbness in the left leg. No fever, confusion, syncope, dysuria. ED course: -Afebrile, heart rate 47, respirations and pulse ox normal, blood pressure 120/76 -Na137, K4.1, Cr2.5(baseline 2.5), WBC12.8K, Hgb8.6 -Transaminases normal -Calcium 5.4 Albumin 2.5 -Bicarb 12, anion gap normal CK 246 ECG showed atrial fibrillation, rate 122 -Lipase 16 -Lactate normal -Urinalysis many bacteria -She was given vancomycin and ceftriaxone in the hospital service were asked to evaluate for dehydration, flank pain  Assessment & Plan:   Principal Problem:   Dehydration Active Problems:   Hypothyroid   CKD (chronic kidney disease), stage III (HCC)   Hypocalcemia   Urinary tract infection associated with indwelling urethral catheter (HCC)  Back pain   Retroperitoneal mass   Anemia   #1 retroperitoneal mass MRI shows 8 x 6 x 7 cm retroperitoneal mass involving the left psoas muscle new since September 2019 favoring infectious versus inflammatory etiology possibly an abscess.  Patient had 30 cc of pus drained from the left psoas abscess.drain in place still draining pus.  Culture is growing Pseudomonas will start cefepime.needs atleast 2 weeks treatment per dr snyder.will order picc.he has been refusing PT.son wants him to go to rehab.  #2 anemia of chronic renal disease stage -patient received blood transfusion monitor hemoglobin.  Check FOBT.  #3 BPH continue Flomax patient has urinary retention and has chronic Foley.  #4 chronic atrial fibrillation was on Lopressor 25 mg twice a day.  This had to be held due to hypotension.  Monitor and follow-up.    #5 hypertension blood pressure running low at 85/58 today we will hold antihypertensives and slow hydration.  #6 hypothyroidism continue Synthroid  #7 UTI urine culture positive for enterococcus continue ampicillin  #8 left lower extremity weakness -no evidence of stroke question if it is related to the left ileo-psoas abscess pressing on the nerve structures hopefully his weakness will improve after drainage of the abscess.  MRI of lumbar and thoracic spine no acute findings.  #9 hypocalcemia secondary to CKD normal PTH low vitamin D will continue calcium and vitamin D supplements.    Estimated body mass index is 24.84 kg/m as calculated from the following:   Height as of this encounter: 5' 8.5" (1.74 m).   Weight as of this encounter: 75.2 kg.  DVT prophylaxis: Heparin Code Status: Full code Family Communication: No family available Disposition  Plan: .   Consultants: None  Procedures: Left psoas abscess drainage Antimicrobials: Zosyn Subjective: Resting in bed does not appear to be in any distress but is upset and angry did not know he has so much health  problems he says he lives at home with family   Objective: Vitals:   11/29/18 0602 11/29/18 0625 11/29/18 1318 11/29/18 1328  BP: (!) 80/46 (!) 90/55 (!) 86/49 102/68  Pulse: 80 83 (!) 119 88  Resp: 16 16 12    Temp:   97.9 F (36.6 C)   TempSrc:   Oral   SpO2:   100%   Weight:      Height:        Intake/Output Summary (Last 24 hours) at 11/29/2018 1410 Last data filed at 11/29/2018 1305 Gross per 24 hour  Intake 2307.97 ml  Output 890 ml  Net 1417.97 ml   Filed Weights   11/26/18 0208  Weight: 75.2 kg    Examination:  General exam: Appears calm and comfortable  Respiratory system: Clear to auscultation. Respiratory effort normal. Cardiovascular system: S1 & S2 heard, RRR. No JVD, murmurs, rubs, gallops or clicks. No pedal edema. Gastrointestinal system: Abdomen is nondistended, soft and nontender. No organomegaly or masses felt. Normal bowel sounds heard.  Drain in place with pus color drainage tender over the left upper left CVA and left anterior abdomen. Central nervous system: Alert and oriented. No focal neurological deficits. Extremities: Symmetric 5 x 5 power. Skin: No rashes, lesions or ulcers Psychiatry: Judgement and insight appear normal. Mood & affect appropriate.     Data Reviewed: I have personally reviewed following labs and imaging studies  CBC: Recent Labs  Lab 11/26/18 0216 11/27/18 0558 11/28/18 0554 11/29/18 0612  WBC 12.8* 10.9* 11.2* 8.8  NEUTROABS 10.2*  --  8.3*  --   HGB 8.6* 6.9* 9.0* 8.1*  HCT 27.5* 22.0* 28.8* 25.5*  MCV 100.4* 100.9* 100.7* 100.4*  PLT 176 154 142* 106   Basic Metabolic Panel: Recent Labs  Lab 11/26/18 0216 11/27/18 0558 11/28/18 0554 11/29/18 0612  NA 137 138 138 139  K 4.1 3.8 3.9 3.7  CL 115* 115* 114* 115*  CO2 12* 13* 13* 14*  GLUCOSE 105* 190* 90 94  BUN 46* 44* 43* 47*  CREATININE 2.54* 2.68* 2.55* 2.48*  CALCIUM 5.4* 5.2*  5.1 5.6* 6.1*  MG  --   --   --  1.1*  PHOS  --  4.3  --   --     GFR: Estimated Creatinine Clearance: 21.4 mL/min (A) (by C-G formula based on SCr of 2.48 mg/dL (H)). Liver Function Tests: Recent Labs  Lab 11/26/18 0216 11/27/18 0558 11/29/18 0612  AST 16  --  10*  ALT 9  --  8  ALKPHOS 80  --  59  BILITOT 0.7  --  0.9  PROT 6.1*  --  4.9*  ALBUMIN 2.5* 2.0* 2.0*   Recent Labs  Lab 11/26/18 0216  LIPASE 16   No results for input(s): AMMONIA in the last 168 hours. Coagulation Profile: Recent Labs  Lab 11/28/18 0554  INR 1.34   Cardiac Enzymes: Recent Labs  Lab 11/26/18 0216  CKTOTAL 246   BNP (last 3 results) Recent Labs    08/22/18 1224  PROBNP 371.0*   HbA1C: No results for input(s): HGBA1C in the last 72 hours. CBG: No results for input(s): GLUCAP in the last 168 hours. Lipid Profile: No results for input(s): CHOL, HDL, LDLCALC, TRIG, CHOLHDL, LDLDIRECT  in the last 72 hours. Thyroid Function Tests: No results for input(s): TSH, T4TOTAL, FREET4, T3FREE, THYROIDAB in the last 72 hours. Anemia Panel: No results for input(s): VITAMINB12, FOLATE, FERRITIN, TIBC, IRON, RETICCTPCT in the last 72 hours. Sepsis Labs: Recent Labs  Lab 11/26/18 0555  LATICACIDVEN 0.72    Recent Results (from the past 240 hour(s))  Urine culture     Status: Abnormal   Collection Time: 11/19/18  4:40 PM  Result Value Ref Range Status   Specimen Description   Final    URINE, CATHETERIZED Performed at [facility], [address] [place], [place] [address]    Special Requests   Final    Normal Performed at [facility], [address] [place], [place] [address]    Culture >=100,000 COLONIES/mL ENTEROCOCCUS FAECALIS (A)  Final   Report Status 11/22/2018 FINAL  Final   Organism ID, Bacteria ENTEROCOCCUS FAECALIS (A)  Final      Susceptibility   Enterococcus faecalis - MIC*    AMPICILLIN <=2 SENSITIVE Sensitive     LEVOFLOXACIN 1 SENSITIVE Sensitive     NITROFURANTOIN <=16 SENSITIVE Sensitive     VANCOMYCIN 1  SENSITIVE Sensitive     * >=100,000 COLONIES/mL ENTEROCOCCUS FAECALIS  Urine Culture     Status: Abnormal   Collection Time: 11/26/18  3:42 AM  Result Value Ref Range Status   Specimen Description   Final    URINE, RANDOM Performed at [facility], [address] [place], [place] [address]    Special Requests   Final    NONE Performed at [facility], [address] [place], [place] [address]    Culture >=100,000 COLONIES/mL ENTEROCOCCUS FAECALIS (A)  Final   Report Status 11/28/2018 FINAL  Final   Organism ID, Bacteria ENTEROCOCCUS FAECALIS (A)  Final      Susceptibility   Enterococcus faecalis - MIC*    AMPICILLIN <=2 SENSITIVE Sensitive     LEVOFLOXACIN 2 SENSITIVE Sensitive     NITROFURANTOIN <=16 SENSITIVE Sensitive     VANCOMYCIN 1 SENSITIVE Sensitive     * >=100,000 COLONIES/mL ENTEROCOCCUS FAECALIS  Blood culture (routine x 2)     Status: None (Preliminary result)   Collection Time: 11/26/18  5:30 AM  Result Value Ref Range Status   Specimen Description   Final    BLOOD RIGHT ANTECUBITAL Performed at [facility], [address] [place], [place] [address]    Special Requests   Final    BOTTLES DRAWN AEROBIC AND ANAEROBIC Blood Culture adequate volume Performed at [facility], [address] [place], [place] [address]    Culture   Final    NO GROWTH 3 DAYS Performed at [facility], [place] [address] [place], [place] [address]    Report Status PENDING  Incomplete  Blood culture (routine x 2)     Status: None (Preliminary result)   Collection Time: 11/26/18  5:45 AM  Result Value Ref Range Status   Specimen Description   Final    BLOOD LEFT WRIST Performed at [facility], [address] [place], [place] [address]    Special Requests   Final    BOTTLES DRAWN AEROBIC AND ANAEROBIC Blood Culture adequate volume Performed at [facility], [address] [place], [place] [address]     Culture   Final    NO GROWTH 3  DAYS Performed at [facility], [place] [address], [place], [place] [address]    Report Status PENDING  Incomplete  Aerobic/Anaerobic Culture (surgical/deep wound)     Status: None (Preliminary result)   Collection Time: 11/28/18  9:54 AM  Result Value Ref Range Status   Specimen Description   Final    ABSCESS LEFT PSOAS Performed at [place] [address], [place], [place] [address]    Special Requests   Final    Normal Performed at [facility], [place] [address], [place], [place] [address]    Gram Stain   Final    ABUNDANT WBC PRESENT, PREDOMINANTLY PMN RARE GRAM NEGATIVE RODS Performed at [facility], [place] [address] [place],  [address]    Culture MODERATE PSEUDOMONAS AERUGINOSA  Final   Report Status PENDING  Incomplete         Radiology Studies: Ct Image Guided Drainage Percut Cath  Peritoneal Retroperit  Result Date: 11/28/2018 INDICATION: Left retroperitoneal psoas abscess EXAM: CT DRAINAGE LEFT PSOAS ABSCESS MEDICATIONS: The patient is currently admitted to the hospital and receiving intravenous antibiotics. The antibiotics were administered within an appropriate time frame prior to the initiation of the procedure. ANESTHESIA/SEDATION: Fentanyl 25 mcg IV; Versed 1.0 mg IV Moderate Sedation Time:  14 MINUTES The patient was continuously monitored during the procedure by the interventional radiology nurse under my direct supervision. COMPLICATIONS: None immediate. PROCEDURE: Informed written consent was obtained from the patient after a thorough discussion of the procedural risks, benefits and alternatives. All questions were addressed. Maximal Sterile Barrier Technique was utilized including caps, mask, sterile gowns, sterile gloves, sterile drape, hand hygiene and skin antiseptic. A timeout was performed prior to the initiation of the procedure. Previous imaging reviewed. Patient position  prone. Noncontrast localization CT. The left retroperitoneal abscess was localized and marked for a posterior paraspinous approach. Under sterile conditions and local anesthesia, a 17 gauge 11.8 cm access needle was advanced percutaneously into the left psoas abscess. Syringe aspiration yielded purulent fluid. Sample sent for cytology and culture. Guidewire inserted followed by tract dilatation to insert a pigtail drain. Drain catheter position confirmed within the abscess with CT. Syringe aspiration yielded 30 cc purulent fluid. Catheter connected to external suction bulb. Catheter secured with Prolene suture and a sterile dressing. No immediate complication. Patient tolerated the procedure well. IMPRESSION: Successful CT-guided left psoas abscess 10 French drain placement. Sample sent for cytology and culture. Electronically Signed   By: Jerilynn Mages.  Shick M.D.   On: 11/28/2018 11:08        Scheduled Meds: . sodium chloride   Intravenous Once  . calcium-vitamin D  2 tablet Oral BID  . levothyroxine  88 mcg Oral QAC breakfast  . sodium bicarbonate  650 mg Oral BID  . sodium chloride flush  5 mL Intracatheter Q8H  . tamsulosin  0.4 mg Oral QHS  . [START ON 12/02/2018] Vitamin D (Ergocalciferol)  50,000 Units Oral Q7 days   Continuous Infusions: . sodium chloride 100 mL/hr at 11/29/18 0530  . ampicillin (OMNIPEN) IV 1 g (11/29/18 1307)     LOS: 2 days     Georgette Shell, MD [facility] If 7PM-7AM, please contact night-coverage [email or web address] Password [email or web address] 11/29/2018, 2:10 PM

## 2018-11-29 NOTE — Progress Notes (Signed)
CRITICAL VALUE ALERT  Critical Value:  Potassium 6.1  Date & Time Notied:  11/29/18 @0700   Provider Notified: Fransisco Beau.  Orders Received/Actions taken:  Waiting for orders.

## 2018-11-29 NOTE — Progress Notes (Signed)
Pharmacy Antibiotic Note  [name]. is a 85 y.o. male admitted on 11/26/2018 with UTI.  Pharmacy has been consulted for piperacillin/tazobactam dosing.  Pt currently on day #3 IV ampicillin for enterococcus UTI. Wound/abscess culture now growing pseudomonas - antibiotics being changed to piperacillin/tazobactam.   Today, 11/29/18  WBC WNL - trending down  SCr 2.48, CrCl ~22 mL/min  Afebrile  Plan:  Discontinue ampicillin  Initiate piperacillin/tazobactam 3.375 g V q8h EI   Follow renal function for any necessary dose adjustments  Follow culture data   Height: 5' 8.5" (174 cm) Weight: 165 lb 12.6 oz (75.2 kg) IBW/kg (Calculated) : 69.55  Temp (24hrs), Avg:97.5 F (36.4 C), Min:97.2 F (36.2 C), Max:97.9 F (36.6 C)  Recent Labs  Lab 11/26/18 0216 11/26/18 0555 11/27/18 0558 11/28/18 0554 11/29/18 0612  WBC 12.8*  --  10.9* 11.2* 8.8  CREATININE 2.54*  --  2.68* 2.55* 2.48*  LATICACIDVEN  --  0.72  --   --   --     Estimated Creatinine Clearance: 21.4 mL/min (A) (by C-G formula based on SCr of 2.48 mg/dL (H)).    Allergies  Allergen Reactions  . Aspirin Other (See Comments)    Low bp, stomach pains  . Shellfish Allergy     Antimicrobials this admission: ampicillin 1/6 >> 1/6 Piperacillin/tazobactam 1/9 >>   Dose adjustments this admission:  Microbiology results: 1/6 BCx: ngtd 1/6 UCx: >100K enterococcus faecalis, pan-sensitive  1/8 Abscess left psoas: Moderate pseudomonas aeruginosa. Sensitivities pending   Thank you for allowing pharmacy to be a part of this patient's care.  Lenis Noon, PharmD 11/29/2018 2:16 PM

## 2018-11-29 NOTE — Progress Notes (Signed)
PT Cancellation Note  Patient Details Name: [name]. MRN: [id number] DOB: [birth year]   Cancelled Treatment:    Reason Eval/Treat Not Completed: Pain limiting ability to participate(Pt refuses all mobility, including up to chair, sitting EOB, and LE exercises in bed due, due to severe back pain. Pt also reports his L leg won't work, and he wants to get out of the hospital. PT to check back as schedule allows.)  Julien Girt, PT Acute Rehabilitation Services Pager [phone]  Office [phone]   [name] 11/29/2018, 3:28 PM

## 2018-11-30 ENCOUNTER — Inpatient Hospital Stay: Payer: Self-pay

## 2018-11-30 LAB — BASIC METABOLIC PANEL
Anion gap: 9 (ref 5–15)
BUN: 40 mg/dL — ABNORMAL HIGH (ref 8–23)
CHLORIDE: 114 mmol/L — AB (ref 98–111)
CO2: 15 mmol/L — AB (ref 22–32)
Calcium: 6.6 mg/dL — ABNORMAL LOW (ref 8.9–10.3)
Creatinine, Ser: 2.42 mg/dL — ABNORMAL HIGH (ref 0.61–1.24)
GFR calc Af Amer: 27 mL/min — ABNORMAL LOW (ref >60)
GFR calc non Af Amer: 23 mL/min — ABNORMAL LOW (ref >60)
Glucose, Bld: 89 mg/dL (ref 70–99)
Potassium: 3.8 mmol/L (ref 3.5–5.1)
Sodium: 138 mmol/L (ref 135–145)

## 2018-11-30 LAB — SEDIMENTATION RATE: Sed Rate: 34 mm/hr — ABNORMAL HIGH (ref 0–16)

## 2018-11-30 LAB — CALCIUM, IONIZED: Calcium, Ionized, Serum: 3.7 mg/dL — ABNORMAL LOW (ref 4.5–5.6)

## 2018-11-30 LAB — C-REACTIVE PROTEIN: CRP: 5.7 mg/dL — ABNORMAL HIGH (ref <1.0)

## 2018-11-30 MED ORDER — DIPHENHYDRAMINE-ZINC ACETATE 2-0.1 % EX CREA
TOPICAL_CREAM | Freq: Two times a day (BID) | CUTANEOUS | Status: DC | PRN
  Administered 2018-12-03: 1 via TOPICAL
  Filled 2018-11-30: qty 28

## 2018-11-30 MED ORDER — MAGNESIUM SULFATE 2 GM/50ML IV SOLN
2.0000 g | Freq: Once | INTRAVENOUS | Status: AC
  Administered 2018-11-30: 2 g via INTRAVENOUS
  Filled 2018-11-30: qty 50

## 2018-11-30 MED ORDER — TRAMADOL HCL 50 MG PO TABS
50.0000 mg | ORAL_TABLET | Freq: Two times a day (BID) | ORAL | Status: DC | PRN
  Administered 2018-12-01: 50 mg via ORAL
  Filled 2018-11-30: qty 1

## 2018-11-30 MED ORDER — SODIUM CHLORIDE 0.9 % IV SOLN
1.0000 g | Freq: Two times a day (BID) | INTRAVENOUS | Status: AC
  Administered 2018-11-30 – 2018-12-03 (×7): 1 g via INTRAVENOUS
  Filled 2018-11-30 (×7): qty 1

## 2018-11-30 MED ORDER — SODIUM CHLORIDE 0.9 % IV SOLN
2.0000 g | Freq: Every day | INTRAVENOUS | Status: DC
  Administered 2018-11-30 – 2018-12-06 (×7): 2 g via INTRAVENOUS
  Filled 2018-11-30 (×8): qty 2

## 2018-11-30 NOTE — Progress Notes (Signed)
Pharmacy Antibiotic Note  Denis Carreon. is a 85 y.o. male admitted on 11/26/2018 with psoas abscess growing Pseudomonas resistant to Zosyn and E faecalis UTI. Zosyn being changed to Amp + Cefepime  Today, 11/30/18  D5 abx  WBC normalized  SCr stable but above baseline (CKD4)  Afebrile  Plan:  Resume Ampicillin 1g IV q12 - should be sufficient concentrations for UTI  Cefepime 2g IV q24 hr for confirmed pseudomonal abscess  Pharmacy will sign off with sensitivities known; will follow peripherally for new Cx results or renal adjustments  Height: 5' 8.5" (174 cm) Weight: 165 lb 12.6 oz (75.2 kg) IBW/kg (Calculated) : 69.55  Temp (24hrs), Avg:98.2 F (36.8 C), Min:97.6 F (36.4 C), Max:99.1 F (37.3 C)  Recent Labs  Lab 11/26/18 0216 11/26/18 0555 11/27/18 0558 11/28/18 0554 11/29/18 0612 11/30/18 0648  WBC 12.8*  --  10.9* 11.2* 8.8  --   CREATININE 2.54*  --  2.68* 2.55* 2.48* 2.42*  LATICACIDVEN  --  0.72  --   --   --   --     Estimated Creatinine Clearance: 22 mL/min (A) (by C-G formula based on SCr of 2.42 mg/dL (H)).    Allergies  Allergen Reactions  . Aspirin Other (See Comments)    Low bp, stomach pains  . Shellfish Allergy      Thank you for allowing pharmacy to be a part of this patient's care.  Reuel Boom, PharmD, BCPS [phone] 11/30/2018, 4:15 PM

## 2018-11-30 NOTE — Progress Notes (Signed)
Received order for PICC.  Plan to insert tonight or in am.

## 2018-11-30 NOTE — Progress Notes (Signed)
PT Cancellation Note  Patient Details Name: [name]. MRN: [id number] DOB: 1931-03-11   Cancelled Treatment:    Reason Eval/Treat Not Completed: Pain limiting ability to participate;Fatigue/lethargy limiting ability to participate;Patient declined, no reason specified - Pt refusing PT for third day in a row, stating "I have enough problems without having to worry about exercise". PT offered sitting EOB, doing LE exercises at EOB, and doing LE exercises in bed. Pt refused all attempts. PT signing off at this time given number of refusals from pt. Please re-consult PT if needed.   Julien Girt, PT Acute Rehabilitation Services Pager [phone]  Office [phone]    [name] 11/30/2018, 12:17 PM

## 2018-11-30 NOTE — Care Management Important Message (Signed)
Important Message  Patient Details  Name: Kaysen Deal. MRN: [id number] Date of Birth: 15-Nov-1931   Medicare Important Message Given:  Yes    Kerin Salen 11/30/2018, 12:16 [place] Message  Patient Details  Name: Moxon Messler. MRN: [id number] Date of Birth: 02-Feb-1931   Medicare Important Message Given:  Yes    Kerin Salen 11/30/2018, 12:16 PM

## 2018-12-01 ENCOUNTER — Inpatient Hospital Stay (HOSPITAL_COMMUNITY): Payer: Medicare HMO

## 2018-12-01 LAB — BASIC METABOLIC PANEL
ANION GAP: 8 (ref 5–15)
BUN: 41 mg/dL — ABNORMAL HIGH (ref 8–23)
CO2: 17 mmol/L — ABNORMAL LOW (ref 22–32)
Calcium: 6.7 mg/dL — ABNORMAL LOW (ref 8.9–10.3)
Chloride: 112 mmol/L — ABNORMAL HIGH (ref 98–111)
Creatinine, Ser: 2.37 mg/dL — ABNORMAL HIGH (ref 0.61–1.24)
GFR calc Af Amer: 28 mL/min — ABNORMAL LOW (ref >60)
GFR calc non Af Amer: 24 mL/min — ABNORMAL LOW (ref >60)
Glucose, Bld: 147 mg/dL — ABNORMAL HIGH (ref 70–99)
Potassium: 3.9 mmol/L (ref 3.5–5.1)
Sodium: 137 mmol/L (ref 135–145)

## 2018-12-01 LAB — CULTURE, BLOOD (ROUTINE X 2)
Culture: NO GROWTH
Culture: NO GROWTH
Special Requests: ADEQUATE
Special Requests: ADEQUATE

## 2018-12-01 LAB — CBC
HCT: 26.2 % — ABNORMAL LOW (ref 39.0–52.0)
Hemoglobin: 8.2 g/dL — ABNORMAL LOW (ref 13.0–17.0)
MCH: 31.8 pg (ref 26.0–34.0)
MCHC: 31.3 g/dL (ref 30.0–36.0)
MCV: 101.6 fL — ABNORMAL HIGH (ref 80.0–100.0)
NRBC: 0 % (ref 0.0–0.2)
Platelets: 142 10*3/uL — ABNORMAL LOW (ref 150–400)
RBC: 2.58 MIL/uL — AB (ref 4.22–5.81)
RDW: 18.2 % — ABNORMAL HIGH (ref 11.5–15.5)
WBC: 8.8 10*3/uL (ref 4.0–10.5)

## 2018-12-01 MED ORDER — MORPHINE SULFATE (PF) 2 MG/ML IV SOLN
2.0000 mg | INTRAVENOUS | Status: DC | PRN
  Administered 2018-12-01 – 2018-12-08 (×14): 2 mg via INTRAVENOUS
  Filled 2018-12-01 (×14): qty 1

## 2018-12-01 MED ORDER — SODIUM CHLORIDE 0.9% FLUSH
10.0000 mL | INTRAVENOUS | Status: DC | PRN
  Administered 2018-12-05: 20 mL
  Filled 2018-12-01: qty 40

## 2018-12-01 MED ORDER — OXYCODONE-ACETAMINOPHEN 5-325 MG PO TABS
1.0000 | ORAL_TABLET | Freq: Four times a day (QID) | ORAL | Status: DC | PRN
  Administered 2018-12-02 – 2018-12-03 (×2): 1 via ORAL
  Filled 2018-12-01 (×2): qty 1

## 2018-12-01 NOTE — Progress Notes (Addendum)
PROGRESS NOTE    [name].  GYJ:[id number] DOB: 13-Jun-1931 DOA: 11/26/2018 PCP: Mackie Pai, PA-C    Brief Narrative:  85 year old male who presented with left flank pain and leg pain.  He does have a significant past medical history for blindness, chronic kidney disease stage IV, atrial fibrillation, hypothyroidism, BPH and history of abdominal aortic aneurysm repair.  Recurrent urinary tract infections.  Reported 2 days of abdominal pain, back pain radiating to the left leg.  Had recurrent urinary tract infections.  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 Physical examination blood pressure 124/70, heart rate 74, temperature 97.6, respirate 16, oxygen saturation 90%.  He was ill looking appearing, his lungs were clear to auscultation bilaterally, heart S1-S2 present, tachycardic, irregular, abdomen was soft, tender to palpation in the left side without guarding or rebound, no lower extremity edema.  Patient was admitted with the working diagnosis of retroperitoneal mass/ to rule out abscess.   Assessment & Plan:   Principal Problem:   Dehydration Active Problems:   Hypothyroid   CKD (chronic kidney disease), stage III (HCC)   Hypocalcemia   Urinary tract infection associated with indwelling urethral catheter (HCC)   Back pain   Retroperitoneal mass   Anemia   1. Left psoas muscle abscess due to pseudomonas, present on admission. Will continue IV antibiotic therapy with ampicillin and cefepime per ID recommendations, will need 2 weeks complete course. Pending PICC placement. Patient has remained afebrile and no leukocytosis. Noted to have persistent and severe pain, will add IV morphine and po oxycodone as needed.   2. Hypothyroid. Continue levothyroxine per home regimen.   3. BPH. Continue tamsulosin.   4. CKD stage IV with non gap metabolic acidosis. Stable renal function with serum cr at 2.37, with K at 3,9 and serum  bicarbonate at 17. Will continue po sodium bicarbonate, follow on renal panel in am.  5. HTN. Will continue to hold on antihypertensive medications for now.   6. UTI due to enterococcus, related to chronic indwelling foley catheter. Continue ampicillin.   7. Chronic atrial fibrillation. Continue rate control with metoprolol, not on anticoagulation for now.   DVT prophylaxis: heparin   Code Status: full Family Communication: I spoke with patient's son at the bedside and all questions were addressed.  Disposition Plan/ discharge barriers: Pending clinical improvement, picc line and placement.   Body mass index is 24.84 kg/m. Malnutrition Type:      Malnutrition Characteristics:      Nutrition Interventions:     RN Pressure Injury Documentation:     Consultants:     Procedures:     Antimicrobials:       Subjective: Patient with severe back pain, 10/10 in intensity, with radiation to the left groin, worse with movement, no associated nausea or vomiting, has been refractive to oral analgesics.   Objective: Vitals:   11/30/18 0610 11/30/18 1431 11/30/18 1942 12/01/18 0525  BP: (!) 109/57 (!) 94/51 111/63 (!) 98/55  Pulse: 61 72 (!) 109 74  Resp: 16 14 20 17   Temp: 97.6 F (36.4 C) 97.8 F (36.6 C) 98.2 F (36.8 C) 97.7 F (36.5 C)  TempSrc: Oral Oral  Oral  SpO2: 98% 100% 100% 98%  Weight:      Height:        Intake/Output Summary (Last 24 hours) at 12/01/2018 1137 Last data filed at 12/01/2018 0900 Gross per 24 hour  Intake 840.45 ml  Output  1910 ml  Net -1069.55 ml   Filed Weights   11/26/18 0208  Weight: 75.2 kg    Examination:   General: in pain, ill looking appearing and deconditioned  Neurology: Awake and alert, non focal  E ENT: mild pallor, no icterus, oral mucosa moist Cardiovascular: No JVD. S1-S2 present, rhythmic, no gallops, rubs, or murmurs. No lower extremity edema. Pulmonary: decreased breath sounds bilaterally, adequate air  movement, no wheezing, rhonchi or rales. Gastrointestinal. Abdomen with, no organomegaly, non tender, no rebound or guarding Skin. No rashes Musculoskeletal: no joint deformities     Data Reviewed: I have personally reviewed following labs and imaging studies  CBC: Recent Labs  Lab 11/26/18 0216 11/27/18 0558 11/28/18 0554 11/29/18 0612 12/01/18 0700  WBC 12.8* 10.9* 11.2* 8.8 8.8  NEUTROABS 10.2*  --  8.3*  --   --   HGB 8.6* 6.9* 9.0* 8.1* 8.2*  HCT 27.5* 22.0* 28.8* 25.5* 26.2*  MCV 100.4* 100.9* 100.7* 100.4* 101.6*  PLT 176 154 142* 152 259*   Basic Metabolic Panel: Recent Labs  Lab 11/27/18 0558 11/28/18 0554 11/29/18 0612 11/30/18 0648 12/01/18 0700  NA 138 138 139 138 137  K 3.8 3.9 3.7 3.8 3.9  CL 115* 114* 115* 114* 112*  CO2 13* 13* 14* 15* 17*  GLUCOSE 190* 90 94 89 147*  BUN 44* 43* 47* 40* 41*  CREATININE 2.68* 2.55* 2.48* 2.42* 2.37*  CALCIUM 5.2*  5.1 5.6* 6.1* 6.6* 6.7*  MG  --   --  1.1*  --   --   PHOS 4.3  --   --   --   --    GFR: Estimated Creatinine Clearance: 22.4 mL/min (A) (by C-G formula based on SCr of 2.37 mg/dL (H)). Liver Function Tests: Recent Labs  Lab 11/26/18 0216 11/27/18 0558 11/29/18 0612  AST 16  --  10*  ALT 9  --  8  ALKPHOS 80  --  59  BILITOT 0.7  --  0.9  PROT 6.1*  --  4.9*  ALBUMIN 2.5* 2.0* 2.0*   Recent Labs  Lab 11/26/18 0216  LIPASE 16   No results for input(s): AMMONIA in the last 168 hours. Coagulation Profile: Recent Labs  Lab 11/28/18 0554  INR 1.34   Cardiac Enzymes: Recent Labs  Lab 11/26/18 0216  CKTOTAL 246   BNP (last 3 results) Recent Labs    08/22/18 1224  PROBNP 371.0*   HbA1C: No results for input(s): HGBA1C in the last 72 hours. CBG: No results for input(s): GLUCAP in the last 168 hours. Lipid Profile: No results for input(s): CHOL, HDL, LDLCALC, TRIG, CHOLHDL, LDLDIRECT in the last 72 hours. Thyroid Function Tests: No results for input(s): TSH, T4TOTAL, FREET4,  T3FREE, THYROIDAB in the last 72 hours. Anemia Panel: No results for input(s): VITAMINB12, FOLATE, FERRITIN, TIBC, IRON, RETICCTPCT in the last 72 hours.    Radiology Studies: I have reviewed all of the imaging during this hospital visit personally     Scheduled Meds: . calcium-vitamin D  2 tablet Oral BID  . heparin injection (subcutaneous)  5,000 Units Subcutaneous Q8H  . levothyroxine  88 mcg Oral QAC breakfast  . sodium bicarbonate  650 mg Oral BID  . sodium chloride flush  5 mL Intracatheter Q8H  . tamsulosin  0.4 mg Oral QHS  . [START ON 12/02/2018] Vitamin D (Ergocalciferol)  50,000 Units Oral Q7 days   Continuous Infusions: . ampicillin (OMNIPEN) IV 1 g (12/01/18 0539)  . ceFEPime (MAXIPIME)  IV 2 g (11/30/18 1846)     LOS: 4 days        Medha Pippen Gerome Apley, MD [facility] Pager [phone]

## 2018-12-01 NOTE — Progress Notes (Signed)
Peripherally Inserted Central Catheter/Midline Placement  The IV Nurse has discussed with the patient and/or persons authorized to consent for the patient, the purpose of this procedure and the potential benefits and risks involved with this procedure.  The benefits include less needle sticks, lab draws from the catheter, and the patient may be discharged home with the catheter. Risks include, but not limited to, infection, bleeding, blood clot (thrombus formation), and puncture of an artery; nerve damage and irregular heartbeat and possibility to perform a PICC exchange if needed/ordered by physician.  Alternatives to this procedure were also discussed.  Bard Power PICC patient education guide, fact sheet on infection prevention and patient information card has been provided to patient /or left at bedside.  Telephone consent obtained from son.  PICC/Midline Placement Documentation  PICC Single Lumen 12/01/18 PICC Right Brachial 6 cm 1 cm (Active)  Indication for Insertion or Continuance of Line Prolonged intravenous therapies;Home intravenous therapies (PICC only) 12/01/2018  6:53 PM  Exposed Catheter (cm) 1 cm 12/01/2018  6:53 PM  Site Assessment Clean;Dry;Intact 12/01/2018  6:53 PM  Line Status Flushed;Saline locked;Blood return noted 12/01/2018  6:53 PM  Dressing Type Transparent 12/01/2018  6:53 PM  Dressing Status Clean;Dry;Intact;Antimicrobial disc in place 12/01/2018  6:53 PM  Line Care Connections checked and tightened 12/01/2018  6:53 PM  Line Adjustment (NICU/IV Team Only) No 12/01/2018  6:53 PM  Dressing Intervention New dressing 12/01/2018  6:53 PM  Dressing Change Due 12/08/18 12/01/2018  6:53 PM       Rolena Infante 12/01/2018, 6:53 PM

## 2018-12-01 NOTE — Progress Notes (Signed)
Attempted to call family for PICC consent.  No answer from telephone numbers available.

## 2018-12-02 LAB — BASIC METABOLIC PANEL
ANION GAP: 6 (ref 5–15)
BUN: 39 mg/dL — ABNORMAL HIGH (ref 8–23)
CO2: 18 mmol/L — ABNORMAL LOW (ref 22–32)
Calcium: 7 mg/dL — ABNORMAL LOW (ref 8.9–10.3)
Chloride: 114 mmol/L — ABNORMAL HIGH (ref 98–111)
Creatinine, Ser: 2.09 mg/dL — ABNORMAL HIGH (ref 0.61–1.24)
GFR calc Af Amer: 32 mL/min — ABNORMAL LOW (ref >60)
GFR calc non Af Amer: 28 mL/min — ABNORMAL LOW (ref >60)
Glucose, Bld: 87 mg/dL (ref 70–99)
POTASSIUM: 3.8 mmol/L (ref 3.5–5.1)
Sodium: 138 mmol/L (ref 135–145)

## 2018-12-02 LAB — CBC WITH DIFFERENTIAL/PLATELET
Abs Immature Granulocytes: 0.09 10*3/uL — ABNORMAL HIGH (ref 0.00–0.07)
BASOS PCT: 0 %
Basophils Absolute: 0 10*3/uL (ref 0.0–0.1)
EOS ABS: 0.1 10*3/uL (ref 0.0–0.5)
Eosinophils Relative: 1 %
HCT: 26.4 % — ABNORMAL LOW (ref 39.0–52.0)
Hemoglobin: 8.4 g/dL — ABNORMAL LOW (ref 13.0–17.0)
Immature Granulocytes: 1 %
Lymphocytes Relative: 10 %
Lymphs Abs: 0.9 10*3/uL (ref 0.7–4.0)
MCH: 31.8 pg (ref 26.0–34.0)
MCHC: 31.8 g/dL (ref 30.0–36.0)
MCV: 100 fL (ref 80.0–100.0)
Monocytes Absolute: 0.9 10*3/uL (ref 0.1–1.0)
Monocytes Relative: 10 %
Neutro Abs: 6.6 10*3/uL (ref 1.7–7.7)
Neutrophils Relative %: 78 %
Platelets: 170 10*3/uL (ref 150–400)
RBC: 2.64 MIL/uL — ABNORMAL LOW (ref 4.22–5.81)
RDW: 18.4 % — ABNORMAL HIGH (ref 11.5–15.5)
WBC: 8.6 10*3/uL (ref 4.0–10.5)
nRBC: 0 % (ref 0.0–0.2)

## 2018-12-02 NOTE — Plan of Care (Signed)
  Problem: Urinary Elimination: Goal: Signs and symptoms of infection will decrease Outcome: Progressing   Problem: Nutrition: Goal: Adequate nutrition will be maintained Outcome: Progressing   Problem: Pain Managment: Goal: General experience of comfort will improve Outcome: Progressing

## 2018-12-02 NOTE — Plan of Care (Signed)
  Problem: Nutrition: Goal: Adequate nutrition will be maintained 12/02/2018 1938 by Dorene Sorrow, RN Outcome: Progressing 12/02/2018 1550 by Dorene Sorrow, RN Outcome: Progressing   Problem: Urinary Elimination: Goal: Signs and symptoms of infection will decrease Outcome: Progressing   Problem: Pain Managment: Goal: General experience of comfort will improve 12/02/2018 1938 by Dorene Sorrow, RN Outcome: Progressing 12/02/2018 1550 by Dorene Sorrow, RN Outcome: Progressing

## 2018-12-02 NOTE — Progress Notes (Signed)
12/02/2018  0759  Notified MD that patient is requesting to see urology.

## 2018-12-02 NOTE — Progress Notes (Signed)
PROGRESS NOTE    [name].  DTO:[id number] DOB: 08/20/1931 DOA: 11/26/2018 PCP: Mackie Pai, PA-C    Brief Narrative:  85 year old male who presented with left flank pain and leg pain.  He does have a significant past medical history for blindness, chronic kidney disease stage IV, atrial fibrillation, hypothyroidism, BPH and history of abdominal aortic aneurysm repair.  Recurrent urinary tract infections.  Reported 2 days of abdominal pain, back pain radiating to the left leg.  Had recurrent urinary tract infections.  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 Physical examination blood pressure 124/70, heart rate 74, temperature 97.6, respirate 16, oxygen saturation 90%.  He was ill looking appearing, his lungs were clear to auscultation bilaterally, heart S1-S2 present, tachycardic, irregular, abdomen was soft, tender to palpation in the left side without guarding or rebound, no lower extremity edema.  Patient was admitted with the working diagnosis of retroperitoneal mass/ to rule out abscess.    Assessment & Plan:   Principal Problem:   Dehydration Active Problems:   Hypothyroid   CKD (chronic kidney disease), stage III (HCC)   Hypocalcemia   Urinary tract infection associated with indwelling urethral catheter (HCC)   Back pain   Retroperitoneal mass   Anemia    1. Left psoas muscle abscess due to pseudomonas, present on admission. Patient had PICC line placed to continue cefepime, plan to continue for 2 weeks. Pain has improved with analgesics, will continue to monitor cell count and temperature curve. Drain in place and continue to drain cloudy material.   2. Hypothyroid. On levothyroxine per home regimen.   3. BPH with chronic indwelling foley catheter. Continue with tamsulosin. This am foley malfunctioning that has resolved, possible clogged. Will continue to monitor urine output.    4. CKD stage IV with non gap  metabolic acidosis. Renal function with serum cr continue to trend down, this am is down to 2,0 with K at 3,8 and serum bicarbonate at 18. Follow on renal panel in am.   5. HTN. Blood pressure has remained stable will continue to hold on antihypertensive medications. Systolic blood pressure 983 to 118 mmHg.   6. UTI due to enterococcus, related to chronic indwelling foley catheter. Continue ampicillin #6 days. Will stop therapy in am to complete 7 days course.    7. Chronic atrial fibrillation. Good reate control with metoprolol, not on anticoagulation for now.    DVT prophylaxis: scd   Code Status: full Family Communication: no family at the bedside  Disposition Plan/ discharge barriers: Possible dc in the next 24 to 48 hours, patient has declined snf, will follow on social services recommendations.   Body mass index is 24.84 kg/m. Malnutrition Type:      Malnutrition Characteristics:      Nutrition Interventions:     RN Pressure Injury Documentation:     Consultants:     Procedures:     Antimicrobials:       Subjective: Patient's abdominal pain has improved with analgesics, he is able to move more, no nausea or vomiting. This am had episode of leaking around the catheter, apparently clogged catheter, now has improved. Patient continue to decline SNF placement.   Objective: Vitals:   12/01/18 0525 12/01/18 1406 12/01/18 2018 12/02/18 0515  BP: (!) 98/55 (!) 103/56 (!) 108/57 118/72  Pulse: 74 71 72 90  Resp: 17 19 19 19   Temp: 97.7 F (36.5 C) 97.9 F (36.6 C) (!) 97.5  F (36.4 C) 97.8 F (36.6 C)  TempSrc: Oral Oral Oral Oral  SpO2: 98% 99% 100% 98%  Weight:      Height:        Intake/Output Summary (Last 24 hours) at 12/02/2018 1145 Last data filed at 12/02/2018 0800 Gross per 24 hour  Intake 612 ml  Output 1320 ml  Net -708 ml   Filed Weights   11/26/18 0208  Weight: 75.2 kg    Examination:   General: deconditioned, not in pain any  more.  Neurology: Awake and alert, non focal  E ENT: mild pallor, no icterus, oral mucosa moist Cardiovascular: No JVD. S1-S2 present, rhythmic, no gallops, rubs, or murmurs. No lower extremity edema. Pulmonary: positive breath sounds bilaterally, adequate air movement, no wheezing, rhonchi or rales. Gastrointestinal. Abdomen soft with no organomegaly, non tender, no rebound or guarding. Left flank drain in place with white/ cloudy material.  Skin. No rashes Musculoskeletal: no joint deformities Foley catheter in place with no genital edema.      Data Reviewed: I have personally reviewed following labs and imaging studies  CBC: Recent Labs  Lab 11/26/18 0216 11/27/18 0558 11/28/18 0554 11/29/18 0612 12/01/18 0700 12/02/18 0317  WBC 12.8* 10.9* 11.2* 8.8 8.8 8.6  NEUTROABS 10.2*  --  8.3*  --   --  6.6  HGB 8.6* 6.9* 9.0* 8.1* 8.2* 8.4*  HCT 27.5* 22.0* 28.8* 25.5* 26.2* 26.4*  MCV 100.4* 100.9* 100.7* 100.4* 101.6* 100.0  PLT 176 154 142* 152 142* 841   Basic Metabolic Panel: Recent Labs  Lab 11/27/18 0558 11/28/18 0554 11/29/18 0612 11/30/18 0648 12/01/18 0700 12/02/18 0317  NA 138 138 139 138 137 138  K 3.8 3.9 3.7 3.8 3.9 3.8  CL 115* 114* 115* 114* 112* 114*  CO2 13* 13* 14* 15* 17* 18*  GLUCOSE 190* 90 94 89 147* 87  BUN 44* 43* 47* 40* 41* 39*  CREATININE 2.68* 2.55* 2.48* 2.42* 2.37* 2.09*  CALCIUM 5.2*  5.1 5.6* 6.1* 6.6* 6.7* 7.0*  MG  --   --  1.1*  --   --   --   PHOS 4.3  --   --   --   --   --    GFR: Estimated Creatinine Clearance: 25.4 mL/min (A) (by C-G formula based on SCr of 2.09 mg/dL (H)). Liver Function Tests: Recent Labs  Lab 11/26/18 0216 11/27/18 0558 11/29/18 0612  AST 16  --  10*  ALT 9  --  8  ALKPHOS 80  --  59  BILITOT 0.7  --  0.9  PROT 6.1*  --  4.9*  ALBUMIN 2.5* 2.0* 2.0*   Recent Labs  Lab 11/26/18 0216  LIPASE 16   No results for input(s): AMMONIA in the last 168 hours. Coagulation Profile: Recent Labs  Lab  11/28/18 0554  INR 1.34   Cardiac Enzymes: Recent Labs  Lab 11/26/18 0216  CKTOTAL 246   BNP (last 3 results) Recent Labs    08/22/18 1224  PROBNP 371.0*   HbA1C: No results for input(s): HGBA1C in the last 72 hours. CBG: No results for input(s): GLUCAP in the last 168 hours. Lipid Profile: No results for input(s): CHOL, HDL, LDLCALC, TRIG, CHOLHDL, LDLDIRECT in the last 72 hours. Thyroid Function Tests: No results for input(s): TSH, T4TOTAL, FREET4, T3FREE, THYROIDAB in the last 72 hours. Anemia Panel: No results for input(s): VITAMINB12, FOLATE, FERRITIN, TIBC, IRON, RETICCTPCT in the last 72 hours.    Radiology Studies: I  have reviewed all of the imaging during this hospital visit personally     Scheduled Meds: . calcium-vitamin D  2 tablet Oral BID  . heparin injection (subcutaneous)  5,000 Units Subcutaneous Q8H  . levothyroxine  88 mcg Oral QAC breakfast  . sodium bicarbonate  650 mg Oral BID  . sodium chloride flush  5 mL Intracatheter Q8H  . tamsulosin  0.4 mg Oral QHS  . Vitamin D (Ergocalciferol)  50,000 Units Oral Q7 days   Continuous Infusions: . ampicillin (OMNIPEN) IV 1 g (12/02/18 0507)  . ceFEPime (MAXIPIME) IV 2 g (12/01/18 1743)     LOS: 5 days        Estefany Goebel Gerome Apley, MD [facility] Pager [phone]

## 2018-12-03 LAB — BASIC METABOLIC PANEL
Anion gap: 9 (ref 5–15)
BUN: 36 mg/dL — ABNORMAL HIGH (ref 8–23)
CALCIUM: 7.3 mg/dL — AB (ref 8.9–10.3)
CO2: 18 mmol/L — ABNORMAL LOW (ref 22–32)
CREATININE: 2.25 mg/dL — AB (ref 0.61–1.24)
Chloride: 113 mmol/L — ABNORMAL HIGH (ref 98–111)
GFR calc Af Amer: 30 mL/min — ABNORMAL LOW (ref >60)
GFR calc non Af Amer: 26 mL/min — ABNORMAL LOW (ref >60)
Glucose, Bld: 168 mg/dL — ABNORMAL HIGH (ref 70–99)
Potassium: 4.2 mmol/L (ref 3.5–5.1)
Sodium: 140 mmol/L (ref 135–145)

## 2018-12-03 LAB — AEROBIC/ANAEROBIC CULTURE W GRAM STAIN (SURGICAL/DEEP WOUND)

## 2018-12-03 LAB — AEROBIC/ANAEROBIC CULTURE (SURGICAL/DEEP WOUND): SPECIAL REQUESTS: NORMAL

## 2018-12-03 MED ORDER — OXYCODONE-ACETAMINOPHEN 5-325 MG PO TABS
1.0000 | ORAL_TABLET | Freq: Three times a day (TID) | ORAL | Status: DC
  Administered 2018-12-03 – 2018-12-09 (×16): 1 via ORAL
  Filled 2018-12-03 (×18): qty 1

## 2018-12-03 NOTE — Progress Notes (Signed)
Pt stated he felt as though his bladder was full and wasn't draining into the foley bag. Clear pale yellow urine noted in the foley tube. RN flushed foley to ensure the foley wasn't clogged. Did not show signs of clogging and flushed easily and clear yellow urine returned into the tubing. Will continue to monitor.

## 2018-12-03 NOTE — Care Management Important Message (Signed)
Important Message  Patient Details  Name: [name]. MRN: [id number] Date of Birth: 06/14/1931   Medicare Important Message Given:  Yes    Kerin Salen 12/03/2018, 11:35 AMImportant Message  Patient Details  Name: [name]. MRN: [id number] Date of Birth: [birth year]   Medicare Important Message Given:  Yes    Kerin Salen 12/03/2018, 11:35 AM

## 2018-12-03 NOTE — Progress Notes (Signed)
PROGRESS NOTE    [name].  KZS:[id number] DOB: 1931-04-08 DOA: 11/26/2018 PCP: Mackie Pai, PA-C    Brief Narrative:  85 year old male who presented with left flank pain and leg pain. He does have a significant past medical history for blindness, chronic kidney disease stage IV, atrial fibrillation, hypothyroidism, BPH and history of abdominal aortic aneurysm repair. Recurrent urinary tract infections. Reported 2 days of abdominal pain, back pain radiating to the left leg. Had recurrent urinary tract infections. 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 Physical examination blood pressure 124/70, heart rate 74, temperature 97.6, respirate 16, oxygen saturation 90%. He was ill looking appearing, his lungs wereclear to auscultation bilaterally, heart S1-S2 present, tachycardic, irregular, abdomen was soft, tender to palpation in the left side without guarding or rebound, no lower extremity edema.  Patient was admitted with the working diagnosis of retroperitoneal mass/ to rule out abscess.   Assessment & Plan:   Principal Problem:   Dehydration Active Problems:   Hypothyroid   CKD (chronic kidney disease), stage III (HCC)   Hypocalcemia   Urinary tract infection associated with indwelling urethral catheter (HCC)   Back pain   Retroperitoneal mass   Anemia  1. Left psoas muscle abscessdue to pseudomonas, present on admission. Plan to continue a total of 2 weeks of IV cefepime, drain working well. Patient continue to have pain, but not requesting analgesics, will change to schedule analgesia. He has remained afebrile with no leukocytosis.   2. Hypothyroid. Continue with levothyroxine per home regimen.   3. BPH/ sp TURP 2012, hx of bulbar urethral stricture with chronic indwelling foley catheter. No further clogging of foley catheter, will continue with tamsulosin. Continue foley care per protocol.   4. CKD stage IV with non  gap metabolic acidosis. Renal function continue to be stable with serum cr at 2,25 with K at 4,2 and serum bicarbonate 18. Follow on renal panel in am.   5. HTN. Blood pressure with systolic 732 mmHg, continue to hold on antihypertensive medications.   6. UTI due to enterococcus, related to chronic indwelling foley catheter.Today will complete 7 days therapy, then will discontinue.   7. Chronic atrial fibrillation. Continue rate control with metoprolol, holding anticoagulation. Old records personally reviewed not recently on anticoagulation.   DVT prophylaxis: scd   Code Status: full Family Communication: no family at the bedside  Disposition Plan/ discharge barriers: Possible dc in the next 24 to 48 hours, patient has declined snf, will follow on social services recommendations.    Body mass index is 24.84 kg/m. Malnutrition Type:      Malnutrition Characteristics:      Nutrition Interventions:     RN Pressure Injury Documentation:     Consultants:     Procedures:     Antimicrobials:       Subjective: Patient continue to have abdominal pain, episodes of confusion and agitation, no nausea or vomiting. Apparently he has not been requesting prn analgesics, due to confusion.   Objective: Vitals:   12/02/18 0515 12/02/18 1632 12/02/18 2015 12/03/18 0518  BP: 118/72 104/61 133/83 96/71  Pulse: 90 68 82 91  Resp: 19 20 19 16   Temp: 97.8 F (36.6 C) (!) 97.5 F (36.4 C) (!) 97.5 F (36.4 C) 98.1 F (36.7 C)  TempSrc: Oral Oral Oral Oral  SpO2: 98% 99% 100% 98%  Weight:      Height:        Intake/Output Summary (  Last 24 hours) at 12/03/2018 1230 Last data filed at 12/03/2018 0600 Gross per 24 hour  Intake 292.1 ml  Output 2175 ml  Net -1882.9 ml   Filed Weights   11/26/18 0208  Weight: 75.2 kg    Examination:   General: deconditioned  Neurology: Awake and alert, non focal  E ENT: mild pallor, no icterus, oral mucosa moist Cardiovascular:  No JVD. S1-S2 present, rhythmic, no gallops, rubs, or murmurs. No lower extremity edema. Pulmonary: positive breath sounds bilaterally, adequate air movement, no wheezing, rhonchi or rales. Gastrointestinal. Abdomen witj no organomegaly, non tender, no rebound or guarding Skin. No rashes Musculoskeletal: no joint deformities Drain placed at the right flank.     Data Reviewed: I have personally reviewed following labs and imaging studies  CBC: Recent Labs  Lab 11/27/18 0558 11/28/18 0554 11/29/18 0612 12/01/18 0700 12/02/18 0317  WBC 10.9* 11.2* 8.8 8.8 8.6  NEUTROABS  --  8.3*  --   --  6.6  HGB 6.9* 9.0* 8.1* 8.2* 8.4*  HCT 22.0* 28.8* 25.5* 26.2* 26.4*  MCV 100.9* 100.7* 100.4* 101.6* 100.0  PLT 154 142* 152 142* 735   Basic Metabolic Panel: Recent Labs  Lab 11/27/18 0558  11/29/18 0612 11/30/18 0648 12/01/18 0700 12/02/18 0317 12/03/18 0318  NA 138   < > 139 138 137 138 140  K 3.8   < > 3.7 3.8 3.9 3.8 4.2  CL 115*   < > 115* 114* 112* 114* 113*  CO2 13*   < > 14* 15* 17* 18* 18*  GLUCOSE 190*   < > 94 89 147* 87 168*  BUN 44*   < > 47* 40* 41* 39* 36*  CREATININE 2.68*   < > 2.48* 2.42* 2.37* 2.09* 2.25*  CALCIUM 5.2*  5.1   < > 6.1* 6.6* 6.7* 7.0* 7.3*  MG  --   --  1.1*  --   --   --   --   PHOS 4.3  --   --   --   --   --   --    < > = values in this interval not displayed.   GFR: Estimated Creatinine Clearance: 23.6 mL/min (A) (by C-G formula based on SCr of 2.25 mg/dL (H)). Liver Function Tests: Recent Labs  Lab 11/27/18 0558 11/29/18 0612  AST  --  10*  ALT  --  8  ALKPHOS  --  59  BILITOT  --  0.9  PROT  --  4.9*  ALBUMIN 2.0* 2.0*   No results for input(s): LIPASE, AMYLASE in the last 168 hours. No results for input(s): AMMONIA in the last 168 hours. Coagulation Profile: Recent Labs  Lab 11/28/18 0554  INR 1.34   Cardiac Enzymes: No results for input(s): CKTOTAL, CKMB, CKMBINDEX, TROPONINI in the last 168 hours. BNP (last 3  results) Recent Labs    08/22/18 1224  PROBNP 371.0*   HbA1C: No results for input(s): HGBA1C in the last 72 hours. CBG: No results for input(s): GLUCAP in the last 168 hours. Lipid Profile: No results for input(s): CHOL, HDL, LDLCALC, TRIG, CHOLHDL, LDLDIRECT in the last 72 hours. Thyroid Function Tests: No results for input(s): TSH, T4TOTAL, FREET4, T3FREE, THYROIDAB in the last 72 hours. Anemia Panel: No results for input(s): VITAMINB12, FOLATE, FERRITIN, TIBC, IRON, RETICCTPCT in the last 72 hours.    Radiology Studies: I have reviewed all of the imaging during this hospital visit personally     Scheduled Meds: . calcium-vitamin  D  2 tablet Oral BID  . heparin injection (subcutaneous)  5,000 Units Subcutaneous Q8H  . levothyroxine  88 mcg Oral QAC breakfast  . sodium bicarbonate  650 mg Oral BID  . sodium chloride flush  5 mL Intracatheter Q8H  . tamsulosin  0.4 mg Oral QHS  . Vitamin D (Ergocalciferol)  50,000 Units Oral Q7 days   Continuous Infusions: . ampicillin (OMNIPEN) IV 1 g (12/03/18 0549)  . ceFEPime (MAXIPIME) IV Stopped (12/02/18 1851)     LOS: 6 days        [name] Gerome Apley, MD [facility] Pager [phone]

## 2018-12-03 NOTE — NC FL2 (Signed)
[place] LEVEL OF CARE SCREENING TOOL     IDENTIFICATION  Patient Name: [name]. Birthdate: Sep 11, 1931 Sex: male Admission Date (Current Location): 11/26/2018  [facility] and [place] Number:      Facility and Address:  [facility],  [place], [place]      Provider Number: [id number]  Attending Physician Name and Address:  Tawni Millers,*  Relative Name and Phone Number:  Dewaine Morocho, [phone]    Current Level of Care: Hospital Recommended Level of Care: [place] Prior Approval Number:    Date Approved/Denied:   PASRR Number: [id number]  Discharge Plan: SNF    Current Diagnoses: Patient Active Problem List   Diagnosis Date Noted  . Anemia 11/27/2018  . Dehydration 11/26/2018  . Back pain 11/26/2018  . Retroperitoneal mass 11/26/2018  . Onychomycosis 02/27/2015  . Pain in lower limb 02/27/2015  . Vitamin D deficiency 11/06/2014  . Hypocalcemia 09/26/2014  . Peripheral neuropathy 09/26/2014  . Thrombocytopenia ([place]) 09/26/2014  . Urinary tract infection associated with indwelling urethral catheter ([place]) 09/26/2014  . Macular degeneration 09/25/2014  . 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 S/P left hip revision 02/10/2014  . Abdominal aortic aneurysm ([place]) 09/30/2013  . Anemia of chronic disease 08/07/2013  . Borderline diabetes mellitus 01/02/2013  . CKD (chronic kidney disease), stage III ([place]) 01/02/2013  . Hypothyroid 10/17/2011  . Smokers' cough ([place]) 10/17/2011  . 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 BPH (benign prostatic hyperplasia) 10/17/2011    Orientation RESPIRATION BLADDER Height & Weight     Time, Self, Situation, Place  Normal Incontinent, External catheter Weight: 165 lb 12.6 oz (75.2 kg) Height:  5' 8.5" (174 cm)  BEHAVIORAL SYMPTOMS/MOOD NEUROLOGICAL BOWEL NUTRITION STATUS      Incontinent Diet(regular)   AMBULATORY STATUS COMMUNICATION OF NEEDS Skin   Extensive Assist Verbally Normal                       Personal Care Assistance Level of Assistance  Bathing, Feeding, Dressing Bathing Assistance: Limited assistance Feeding assistance: Independent Dressing Assistance: Limited assistance     Functional Limitations Info  Sight, Hearing, Speech Sight Info: Adequate Hearing Info: Adequate Speech Info: Adequate    SPECIAL CARE FACTORS FREQUENCY  PT (By licensed PT), OT (By licensed OT)     PT Frequency: 5x wk OT Frequency: 5x wk            Contractures Contractures Info: Not present    Additional Factors Info  Code Status, Allergies Code Status Info: full code Allergies Info: ASPIRIN, SHELLFISH ALLERGY           Current Medications (12/03/2018):  This is the current hospital active medication list Current Facility-Administered Medications  Medication Dose Route Frequency Provider Last Rate Last Dose  . acetaminophen (TYLENOL) tablet 650 mg  650 mg Oral Q6H PRN Edwin Dada, MD   650 mg at 12/01/18 1055   Or  . acetaminophen (TYLENOL) suppository 650 mg  650 mg Rectal Q6H PRN Danford, Suann Larry, MD      . ampicillin (OMNIPEN) 1 g in sodium chloride 0.9 % 100 mL IVPB  1 g Intravenous BID Tawni Millers, MD 300 mL/hr at 12/03/18 0549 1 g at 12/03/18 0549  . calcium-vitamin D (OSCAL WITH D) 500-200 MG-UNIT per tablet 2 tablet  2 tablet Oral BID Georgette Shell, MD   2 tablet at 12/03/18 1035  .  ceFEPIme (MAXIPIME) 2 g in sodium chloride 0.9 % 100 mL IVPB  2 g Intravenous q1800 Polly Cobia, RPH   Stopped at 12/02/18 1851  . diphenhydrAMINE-zinc acetate (BENADRYL) 2-0.1 % cream   Topical BID PRN Georgette Shell, MD      . heparin injection 5,000 Units  5,000 Units Subcutaneous Q8H Georgette Shell, MD   5,000 Units at 12/02/18 2124  . levothyroxine (SYNTHROID, LEVOTHROID) tablet 88 mcg  88 mcg Oral QAC breakfast Edwin Dada, MD   88 mcg at 12/03/18 0553  . morphine 2 MG/ML injection 2 mg  2 mg Intravenous Q2H PRN Arrien, Jimmy Picket, MD   2 mg at 12/03/18 1035  . ondansetron (ZOFRAN) tablet 4 mg  4 mg Oral Q6H PRN Danford, Suann Larry, MD       Or  . ondansetron (ZOFRAN) injection 4 mg  4 mg Intravenous Q6H PRN Danford, Suann Larry, MD      . oxyCODONE-acetaminophen (PERCOCET/ROXICET) 5-325 MG per tablet 1 tablet  1 tablet Oral TID Arrien, Jimmy Picket, MD      . sodium bicarbonate tablet 650 mg  650 mg Oral BID Edwin Dada, MD   650 mg at 12/03/18 1035  . sodium chloride flush (NS) 0.9 % injection 10-40 mL  10-40 mL Intracatheter PRN Arrien, Jimmy Picket, MD      . sodium chloride flush (NS) 0.9 % injection 5 mL  5 mL Intracatheter Q8H Greggory Keen, MD   5 mL at 12/03/18 0553  . tamsulosin (FLOMAX) capsule 0.4 mg  0.4 mg Oral QHS Blount, Xenia T, NP   0.4 mg at 12/02/18 2124  . Vitamin D (Ergocalciferol) (DRISDOL) capsule 50,000 Units  50,000 Units Oral Q7 days Edwin Dada, MD   50,000 Units at 12/02/18 4970     Discharge Medications: Please see discharge summary for a list of discharge medications.  Relevant Imaging Results:  Relevant Lab Results:   Additional Information SS# [id number]  Wende Neighbors, LCSW

## 2018-12-04 LAB — BASIC METABOLIC PANEL
Anion gap: 8 (ref 5–15)
BUN: 34 mg/dL — ABNORMAL HIGH (ref 8–23)
CO2: 20 mmol/L — ABNORMAL LOW (ref 22–32)
Calcium: 7.3 mg/dL — ABNORMAL LOW (ref 8.9–10.3)
Chloride: 113 mmol/L — ABNORMAL HIGH (ref 98–111)
Creatinine, Ser: 2.17 mg/dL — ABNORMAL HIGH (ref 0.61–1.24)
GFR calc Af Amer: 31 mL/min — ABNORMAL LOW (ref >60)
GFR calc non Af Amer: 27 mL/min — ABNORMAL LOW (ref >60)
Glucose, Bld: 118 mg/dL — ABNORMAL HIGH (ref 70–99)
Potassium: 4.1 mmol/L (ref 3.5–5.1)
Sodium: 141 mmol/L (ref 135–145)

## 2018-12-04 NOTE — Progress Notes (Signed)
LCSW following for SNF placement.   Patient's son agreeable to SNF and  Would like [name]. LCSW faxed patient out to facilities. Awaiting bed offers.   Patient will require pre-authorization before he can transfer.  Patient  has not worked with PT/OT. Insurance co will want to see PT/OT notes.    Carolin Coy [place]

## 2018-12-04 NOTE — Progress Notes (Signed)
PROGRESS NOTE    [name].  XKG:[id number] DOB: 03-07-1931 DOA: 11/26/2018 PCP: Mackie Pai, PA-C    Brief Narrative:  85 year old male who presented with left flank pain and leg pain. He does have a significant past medical history for blindness, chronic kidney disease stage IV, atrial fibrillation, hypothyroidism, BPH and history of abdominal aortic aneurysm repair. Recurrent urinary tract infections. Reported 2 days of abdominal pain, back pain radiating to the left leg. Had recurrent urinary tract infections. 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 Physical examination blood pressure 124/70, heart rate 74, temperature 97.6, respirate 16, oxygen saturation 90%. He was ill looking appearing, his lungs wereclear to auscultation bilaterally, heart S1-S2 present, tachycardic, irregular, abdomen was soft, tender to palpation in the left side without guarding or rebound, no lower extremity edema.  Patient was admitted with the working diagnosis of retroperitoneal mass/ to rule out abscess   Assessment & Plan:   Principal Problem:   Dehydration Active Problems:   Hypothyroid   CKD (chronic kidney disease), stage III (HCC)   Hypocalcemia   Urinary tract infection associated with indwelling urethral catheter (HCC)   Back pain   Retroperitoneal mass   Anemia   1. 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 His abdominal pain has improved with scheduled analgesics. Tolerating po well, today he is open to be discharged to snf for IV antibiotic therapy.  2. Hypothyroid.On evothyroxine per home regimen.   3. BPH/ sp TURP 2012, hx of bulbar urethral stricturewith chronic indwelling foley catheter.Had intermittent discomfort with foley catheter that improves with flushing, currently no pain or discomfort.  4. CKD stage IV with non gap metabolic  acidosis.Renal function has been stable with serum cr at 2,17 with K at 4,1 and serum bicarbonate 20. Patient is tolerating well po, will follow on renal panel in am.   5. HTN.Blood pressure continue to be controlled with no medications.  6. UTI due to enterococcus, related to chronic indwelling foley catheter.Treatment was completed with ampicillin.   7. Chronic atrial fibrillation.On metoprolol for rate control, for now continue holding anticoagulation. Apparently has been off anticoagulation for long time per old records review.  DVT prophylaxis:scd Code Status:full Family Communication:no family at the bedside Disposition Plan/ discharge barriers:Pending placement SNF.  Body mass index is 24.84 kg/m. Malnutrition Type:      Malnutrition Characteristics:      Nutrition Interventions:     RN Pressure Injury Documentation:     Consultants:     Procedures:     Antimicrobials:   Cefepime    Subjective: Today patient reports improvement in his abdominal pain but not back to baseline yet, no nausea or vomiting, had intermittent bladder fullness, no nausea or vomiting. Continue to have episodic confusion.   Objective: Vitals:   12/03/18 0518 12/03/18 1423 12/03/18 2002 12/04/18 0532  BP: 96/71 (!) 113/96 118/63 126/74  Pulse: 91 88 86 84  Resp: 16 15 17 18   Temp: 98.1 F (36.7 C) 98.6 F (37 C) 98 F (36.7 C) 97.9 F (36.6 C)  TempSrc: Oral  Oral Oral  SpO2: 98% 99% 99% 98%  Weight:      Height:        Intake/Output Summary (Last 24 hours) at 12/04/2018 1336 Last data filed at 12/04/2018 0930 Gross per 24 hour  Intake 265 ml  Output 1620 ml  Net -1355 ml  Filed Weights   11/26/18 0208  Weight: 75.2 kg    Examination:   General: deconditioned.  Neurology: Awake and alert, non focal  E ENT: mild pallor, no icterus, oral mucosa moist Cardiovascular: No JVD. S1-S2 present, rhythmic, no gallops, rubs, or murmurs. No lower  extremity edema. Pulmonary: positive breath sounds bilaterally, adequate air movement, no wheezing, rhonchi or rales. Gastrointestinal. Abdomen with no organomegaly, non tender, no rebound or guarding Skin. No rashes Musculoskeletal: no joint deformities Left flank drain in place.     Data Reviewed: I have personally reviewed following labs and imaging studies  CBC: Recent Labs  Lab 11/28/18 0554 11/29/18 0612 12/01/18 0700 12/02/18 0317  WBC 11.2* 8.8 8.8 8.6  NEUTROABS 8.3*  --   --  6.6  HGB 9.0* 8.1* 8.2* 8.4*  HCT 28.8* 25.5* 26.2* 26.4*  MCV 100.7* 100.4* 101.6* 100.0  PLT 142* 152 142* 226   Basic Metabolic Panel: Recent Labs  Lab 11/29/18 0612 11/30/18 0648 12/01/18 0700 12/02/18 0317 12/03/18 0318 12/04/18 0413  NA 139 138 137 138 140 141  K 3.7 3.8 3.9 3.8 4.2 4.1  CL 115* 114* 112* 114* 113* 113*  CO2 14* 15* 17* 18* 18* 20*  GLUCOSE 94 89 147* 87 168* 118*  BUN 47* 40* 41* 39* 36* 34*  CREATININE 2.48* 2.42* 2.37* 2.09* 2.25* 2.17*  CALCIUM 6.1* 6.6* 6.7* 7.0* 7.3* 7.3*  MG 1.1*  --   --   --   --   --    GFR: Estimated Creatinine Clearance: 24.5 mL/min (A) (by C-G formula based on SCr of 2.17 mg/dL (H)). Liver Function Tests: Recent Labs  Lab 11/29/18 0612  AST 10*  ALT 8  ALKPHOS 59  BILITOT 0.9  PROT 4.9*  ALBUMIN 2.0*   No results for input(s): LIPASE, AMYLASE in the last 168 hours. No results for input(s): AMMONIA in the last 168 hours. Coagulation Profile: Recent Labs  Lab 11/28/18 0554  INR 1.34   Cardiac Enzymes: No results for input(s): CKTOTAL, CKMB, CKMBINDEX, TROPONINI in the last 168 hours. BNP (last 3 results) Recent Labs    08/22/18 1224  PROBNP 371.0*   HbA1C: No results for input(s): HGBA1C in the last 72 hours. CBG: No results for input(s): GLUCAP in the last 168 hours. Lipid Profile: No results for input(s): CHOL, HDL, LDLCALC, TRIG, CHOLHDL, LDLDIRECT in the last 72 hours. Thyroid Function Tests: No  results for input(s): TSH, T4TOTAL, FREET4, T3FREE, THYROIDAB in the last 72 hours. Anemia Panel: No results for input(s): VITAMINB12, FOLATE, FERRITIN, TIBC, IRON, RETICCTPCT in the last 72 hours.    Radiology Studies: I have reviewed all of the imaging during this hospital visit personally     Scheduled Meds: . calcium-vitamin D  2 tablet Oral BID  . heparin injection (subcutaneous)  5,000 Units Subcutaneous Q8H  . levothyroxine  88 mcg Oral QAC breakfast  . oxyCODONE-acetaminophen  1 tablet Oral TID  . sodium bicarbonate  650 mg Oral BID  . sodium chloride flush  5 mL Intracatheter Q8H  . tamsulosin  0.4 mg Oral QHS  . Vitamin D (Ergocalciferol)  50,000 Units Oral Q7 days   Continuous Infusions: . ceFEPime (MAXIPIME) IV 2 g (12/03/18 2000)     LOS: 7 days        Loreley Schwall Gerome Apley, MD [facility] Pager [phone]

## 2018-12-05 ENCOUNTER — Inpatient Hospital Stay (HOSPITAL_COMMUNITY): Payer: Medicare HMO

## 2018-12-05 LAB — CBC
HCT: 26.7 % — ABNORMAL LOW (ref 39.0–52.0)
Hemoglobin: 8.3 g/dL — ABNORMAL LOW (ref 13.0–17.0)
MCH: 31.9 pg (ref 26.0–34.0)
MCHC: 31.1 g/dL (ref 30.0–36.0)
MCV: 102.7 fL — ABNORMAL HIGH (ref 80.0–100.0)
Platelets: 149 10*3/uL — ABNORMAL LOW (ref 150–400)
RBC: 2.6 MIL/uL — ABNORMAL LOW (ref 4.22–5.81)
RDW: 18.9 % — AB (ref 11.5–15.5)
WBC: 12.5 10*3/uL — ABNORMAL HIGH (ref 4.0–10.5)
nRBC: 0 % (ref 0.0–0.2)

## 2018-12-05 LAB — BASIC METABOLIC PANEL
Anion gap: 10 (ref 5–15)
BUN: 38 mg/dL — ABNORMAL HIGH (ref 8–23)
CO2: 20 mmol/L — ABNORMAL LOW (ref 22–32)
Calcium: 7.9 mg/dL — ABNORMAL LOW (ref 8.9–10.3)
Chloride: 109 mmol/L (ref 98–111)
Creatinine, Ser: 2.16 mg/dL — ABNORMAL HIGH (ref 0.61–1.24)
GFR, EST AFRICAN AMERICAN: 31 mL/min — AB (ref >60)
GFR, EST NON AFRICAN AMERICAN: 27 mL/min — AB (ref >60)
Glucose, Bld: 98 mg/dL (ref 70–99)
Potassium: 4.2 mmol/L (ref 3.5–5.1)
Sodium: 139 mmol/L (ref 135–145)

## 2018-12-05 LAB — LACTIC ACID, PLASMA: Lactic Acid, Venous: 1.3 mmol/L (ref 0.5–1.9)

## 2018-12-05 MED ORDER — SODIUM CHLORIDE 0.9 % IV BOLUS
500.0000 mL | Freq: Once | INTRAVENOUS | Status: AC
  Administered 2018-12-05: 500 mL via INTRAVENOUS

## 2018-12-05 NOTE — Evaluation (Signed)
Physical Therapy Evaluation Patient Details Name: [name]. MRN: [id number] DOB: 04/12/1931 Today's Date: 12/05/2018   History of Present Illness  85yo M w/ PMH including AAA repair, BPH requiring I&O self cath, HTN, CKD, hearing loss who p/w lower abdominal pain, low back pain that radiates down his left leg.   Clinical Impression  On eval, pt was Min assist for bed mobility. He would not agree to sit up on today due to pain. No family present during session. Pt will need continued therapy at SNF. Will follow and progress as able.     Follow Up Recommendations SNF    Equipment Recommendations  (TBD at next venue)    Recommendations for Other Services       Precautions / Restrictions Precautions Precautions: Fall Precaution Comments: pt reports h/o frequent falls, unable to give details Restrictions Weight Bearing Restrictions: No      Mobility  Bed Mobility   Bed Mobility: Rolling Rolling: Min assist   Supine to sit: Min assist     General bed mobility comments: used chux pad to help pt roll completely. Increased time. Pt reliant on bedrails.   Transfers                 General transfer comment: NT-pt declined to attempt due to having pain  Ambulation/Gait                Stairs            Wheelchair Mobility    Modified Rankin (Stroke Patients Only)       Balance Overall balance assessment: Needs assistance                                           Pertinent Vitals/Pain Pain Assessment: Faces Faces Pain Scale: Hurts whole lot Pain Location: back Pain Descriptors / Indicators: Moaning;Grimacing Pain Intervention(s): Limited activity within patient's tolerance    Home Living Family/patient expects to be discharged to:: Unsure Living Arrangements: Alone   Type of Home: House Home Access: Stairs to enter Entrance Stairs-Rails: None Entrance Stairs-Number of Steps: 3 Home Layout: One level Home  Equipment: Walker - 2 wheels;Cane - single point Additional Comments: was home alone, mod I level    Prior Function Level of Independence: Independent with assistive device(s)         Comments: walks with RW     Hand Dominance        Extremity/Trunk Assessment   Upper Extremity Assessment Upper Extremity Assessment: Defer to OT evaluation    Lower Extremity Assessment Lower Extremity Assessment: Generalized weakness       Communication   Communication: HOH  Cognition Arousal/Alertness: Awake/alert Behavior During Therapy: Agitated Overall Cognitive Status: No family/caregiver present to determine baseline cognitive functioning                                 General Comments: followed commands, but several times said he didn't understand--pt is HOH.  easily self distracted       General Comments General comments (skin integrity, edema, etc.): BP 86/52    Exercises     Assessment/Plan    PT Assessment Patient needs continued PT services  PT Problem List Decreased strength;Decreased balance;Decreased mobility;Decreased activity tolerance;Pain       PT Treatment Interventions DME instruction;Functional mobility  training;Gait training;Therapeutic activities;Therapeutic exercise;Balance training;Patient/family education    PT Goals (Current goals can be found in the Care Plan section)  Acute Rehab PT Goals Patient Stated Goal: none stated PT Goal Formulation: With patient Time For Goal Achievement: 12/19/18 Potential to Achieve Goals: Fair    Frequency Min 2X/week   Barriers to discharge        Co-evaluation PT/OT/SLP Co-Evaluation/Treatment: Yes Reason for Co-Treatment: For patient/therapist safety PT goals addressed during session: Mobility/safety with mobility OT goals addressed during session: ADL's and self-care       AM-PAC PT "6 Clicks" Mobility  Outcome Measure Help needed turning from your back to your side while in a flat  bed without using bedrails?: A Little Help needed moving from lying on your back to sitting on the side of a flat bed without using bedrails?: A Lot Help needed moving to and from a bed to a chair (including a wheelchair)?: A Lot Help needed standing up from a chair using your arms (e.g., wheelchair or bedside chair)?: A Lot Help needed to walk in hospital room?: Total Help needed climbing 3-5 steps with a railing? : Total 6 Click Score: 11    End of Session   Activity Tolerance: Patient limited by pain Patient left: in bed;with call bell/phone within reach;with bed alarm set   PT Visit Diagnosis: Repeated falls (R29.6);Unsteadiness on feet (R26.81);Pain;Difficulty in walking, not elsewhere classified (R26.2);Muscle weakness (generalized) (M62.81) Pain - part of body: (back)    Time: 2202-5427 PT Time Calculation (min) (ACUTE ONLY): 12 min   Charges:   PT Evaluation $PT Eval Moderate Complexity: [place], PT Acute Rehabilitation Services Pager: [phone] Office: [phone]

## 2018-12-05 NOTE — Progress Notes (Signed)
PROGRESS NOTE    [name].  QIH:[id number] DOB: July 13, 1931 DOA: 11/26/2018 PCP: Mackie Pai, PA-C    Brief Narrative; 85 year old with past medical history significant for blindness, chronic kidney disease a stage IV, A. fib, hypothyroidism, BPH, abdominal aortic aneurysm status post repair who presented with left flank pain and leg pain.  Patient was admitted with a working diagnosis of retroperitoneal mass rule out abscess..    Assessment & Plan:   Principal Problem:   Dehydration Active Problems:   Hypothyroid   CKD (chronic kidney disease), stage III (HCC)   Hypocalcemia   Urinary tract infection associated with indwelling urethral catheter (HCC)   Back pain   Retroperitoneal mass   Anemia   Left psoas muscle abscess due to severe pneumonia; Present on admission.. Patient has been on IV cefepime. ID is recommending minimum of 2  weeks of IV antibiotics. Patient underwent left psoas abscess drain placed by IR on January 8, 202020. Drain site, leaking.  IR consulted for reevaluation.. Recommend a recent repeat a CT scan  Hypothyroidism; Continue with Synthroid  Hypotension;  This has could be related to morphine. We will cycle lactic acid, gave IV bolus.  Chronic kidney disease stage IV; Renal function has been stable. When blood pressure allows will try to do Lasix  Bilateral pleural effusion; with Adjacent lobe collapse consolidation Hold IV fluids. Incentive spirometry We will try IV Lasix when blood pressure stable   BPH/ sp TURP 2012, hx of bulbar urethral stricturewith chronic indwelling foley catheter Intermittent discomfort with Foley catheter that improved with flushing.Marland Kitchen  Hypertension; blood pressure has been controlled off of medication.  UTI due to enterococcus, related to chronic indwelling Foley catheter He received treatment with ampicillin.  Neck A. Fib Continue with metoprolol.. Patient has been off of anticoagulation for long  period of time                 Estimated body mass index is 24.84 kg/m as calculated from the following:   Height as of this encounter: 5' 8.5" (1.74 m).   Weight as of this encounter: 75.2 kg.   DVT prophylaxis: Heparin Code Status: Full code Family Communication: No family at bedside Disposition Plan: Continued for IV antibiotics, stabilization of blood pressure, need PT, needed SNF placement.  Consultants:   IR   Procedures:    Antimicrobials:   Cefepime   Subjective: Frustrated, he does not want to go to skilled nursing facility he still having back and flank pain  Objective: Vitals:   12/04/18 0532 12/04/18 1400 12/04/18 2003 12/05/18 0352  BP: 126/74 102/69 110/67 127/80  Pulse: 84 86 (!) 102 100  Resp: 18 18 18 19   Temp: 97.9 F (36.6 C) 98 F (36.7 C) 98.6 F (37 C) 99.1 F (37.3 C)  TempSrc: Oral     SpO2: 98% 100% 96% 97%  Weight:      Height:        Intake/Output Summary (Last 24 hours) at 12/05/2018 0945 Last data filed at 12/05/2018 0920 Gross per 24 hour  Intake 300 ml  Output 2500 ml  Net -2200 ml   Filed Weights   11/26/18 0208  Weight: 75.2 kg    Examination:  General exam: Appears calm and comfortable  Respiratory system: Clear to auscultation. Respiratory effort normal. Cardiovascular system: S1 & S2 heard, RRR. No JVD, murmurs, rubs, gallops or clicks. No pedal edema. Gastrointestinal system: Abdomen is nondistended, soft and nontender. No organomegaly or masses felt. Normal  bowel sounds heard. Central nervous system: Alert and oriented. Back with drain in place with bloody dressing and fluid.  Extremities: Symmetric 5 x 5 power. Skin: No rashes, lesions or ulcers Psychiatry: Judgement and insight appear normal. Mood & affect appropriate.     Data Reviewed: I have personally reviewed following labs and imaging studies  CBC: Recent Labs  Lab 11/29/18 0612 12/01/18 0700 12/02/18 0317  WBC 8.8 8.8 8.6    NEUTROABS  --   --  6.6  HGB 8.1* 8.2* 8.4*  HCT 25.5* 26.2* 26.4*  MCV 100.4* 101.6* 100.0  PLT 152 142* 469   Basic Metabolic Panel: Recent Labs  Lab 11/29/18 0612  12/01/18 0700 12/02/18 0317 12/03/18 0318 12/04/18 0413 12/05/18 0345  NA 139   < > 137 138 140 141 139  K 3.7   < > 3.9 3.8 4.2 4.1 4.2  CL 115*   < > 112* 114* 113* 113* 109  CO2 14*   < > 17* 18* 18* 20* 20*  GLUCOSE 94   < > 147* 87 168* 118* 98  BUN 47*   < > 41* 39* 36* 34* 38*  CREATININE 2.48*   < > 2.37* 2.09* 2.25* 2.17* 2.16*  CALCIUM 6.1*   < > 6.7* 7.0* 7.3* 7.3* 7.9*  MG 1.1*  --   --   --   --   --   --    < > = values in this interval not displayed.   GFR: Estimated Creatinine Clearance: 24.6 mL/min (A) (by C-G formula based on SCr of 2.16 mg/dL (H)). Liver Function Tests: Recent Labs  Lab 11/29/18 0612  AST 10*  ALT 8  ALKPHOS 59  BILITOT 0.9  PROT 4.9*  ALBUMIN 2.0*   No results for input(s): LIPASE, AMYLASE in the last 168 hours. No results for input(s): AMMONIA in the last 168 hours. Coagulation Profile: No results for input(s): INR, PROTIME in the last 168 hours. Cardiac Enzymes: No results for input(s): CKTOTAL, CKMB, CKMBINDEX, TROPONINI in the last 168 hours. BNP (last 3 results) Recent Labs    08/22/18 1224  PROBNP 371.0*   HbA1C: No results for input(s): HGBA1C in the last 72 hours. CBG: No results for input(s): GLUCAP in the last 168 hours. Lipid Profile: No results for input(s): CHOL, HDL, LDLCALC, TRIG, CHOLHDL, LDLDIRECT in the last 72 hours. Thyroid Function Tests: No results for input(s): TSH, T4TOTAL, FREET4, T3FREE, THYROIDAB in the last 72 hours. Anemia Panel: No results for input(s): VITAMINB12, FOLATE, FERRITIN, TIBC, IRON, RETICCTPCT in the last 72 hours. Sepsis Labs: No results for input(s): PROCALCITON, LATICACIDVEN in the last 168 hours.  Recent Results (from the past 240 hour(s))  Urine Culture     Status: Abnormal   Collection Time: 11/26/18   3:42 AM  Result Value Ref Range Status   Specimen Description   Final    URINE, RANDOM Performed at [facility], [address] [place], [place] [address]    Special Requests   Final    NONE Performed at [facility], [address] [place], [place] [address]    Culture >=100,000 COLONIES/mL ENTEROCOCCUS FAECALIS (A)  Final   Report Status 11/28/2018 FINAL  Final   Organism ID, Bacteria ENTEROCOCCUS FAECALIS (A)  Final      Susceptibility   Enterococcus faecalis - MIC*    AMPICILLIN <=2 SENSITIVE Sensitive     LEVOFLOXACIN 2 SENSITIVE Sensitive     NITROFURANTOIN <=16  SENSITIVE Sensitive     VANCOMYCIN 1 SENSITIVE Sensitive     * >=100,000 COLONIES/mL ENTEROCOCCUS FAECALIS  Blood culture (routine x 2)     Status: None   Collection Time: 11/26/18  5:30 AM  Result Value Ref Range Status   Specimen Description   Final    BLOOD RIGHT ANTECUBITAL Performed at [facility], [address] [place], [place] [address]    Special Requests   Final    BOTTLES DRAWN AEROBIC AND ANAEROBIC Blood Culture adequate volume Performed at [facility], [address] [place], [place] [address]    Culture   Final    NO GROWTH 5 DAYS Performed at [facility], [place] [address] [place], [place] [address]    Report Status 12/01/2018 FINAL  Final  Blood culture (routine x 2)     Status: None   Collection Time: 11/26/18  5:45 AM  Result Value Ref Range Status   Specimen Description   Final    BLOOD LEFT WRIST Performed at [facility], [address] [place], [place] [address]    Special Requests   Final    BOTTLES DRAWN AEROBIC AND ANAEROBIC Blood Culture adequate volume Performed at [facility], [address] [place], [place] [address]    Culture   Final    NO GROWTH 5 DAYS Performed at [facility], [place] [address], [place], [place] [address]    Report Status 12/01/2018 FINAL  Final  Aerobic/Anaerobic  Culture (surgical/deep wound)     Status: None   Collection Time: 11/28/18  9:54 AM  Result Value Ref Range Status   Specimen Description   Final    ABSCESS LEFT PSOAS Performed at [facility] [address], [place], [place] [address]    Special Requests   Final    Normal Performed at [facility], [place] [address], [place], [place] [address]    Gram Stain   Final    ABUNDANT WBC PRESENT, PREDOMINANTLY PMN RARE GRAM NEGATIVE RODS    Culture   Final    MODERATE PSEUDOMONAS AERUGINOSA NO ANAEROBES ISOLATED Performed at [facility], [place] [address] [place],  [address]    Report Status 12/03/2018 FINAL  Final   Organism ID, Bacteria PSEUDOMONAS AERUGINOSA  Final      Susceptibility   Pseudomonas aeruginosa - MIC*    CEFTAZIDIME 4 SENSITIVE Sensitive     CIPROFLOXACIN >=4 RESISTANT Resistant     GENTAMICIN >=16 RESISTANT Resistant     IMIPENEM >=16 RESISTANT Resistant     CEFEPIME 8 SENSITIVE Sensitive     * MODERATE PSEUDOMONAS AERUGINOSA         Radiology Studies: No results found.      Scheduled Meds: . calcium-vitamin D  2 tablet Oral BID  . heparin injection (subcutaneous)  5,000 Units Subcutaneous Q8H  . levothyroxine  88 mcg Oral QAC breakfast  . oxyCODONE-acetaminophen  1 tablet Oral TID  . sodium bicarbonate  650 mg Oral BID  . sodium chloride flush  5 mL Intracatheter Q8H  . tamsulosin  0.4 mg Oral QHS  . Vitamin D (Ergocalciferol)  50,000 Units Oral Q7 days   Continuous Infusions: . ceFEPime (MAXIPIME) IV 2 g (12/04/18 1645)     LOS: 8 days    Time spent: 35 minutes    Loxley Cibrian A Rylei Codispoti, MD [facility]   If 7PM-7AM,  please contact night-coverage [email or web address] Password [facility] 12/05/2018, 9:45 AM

## 2018-12-05 NOTE — Progress Notes (Signed)
Facility started preauthorization.  Patient will need PT/OT for authorization.   Patient can transfer to [name] when medically stable and insurance auth approved.   [name]

## 2018-12-05 NOTE — Progress Notes (Signed)
LCSW following for SNF placement.   LCSW faxed requested information to [facility] for insurance authorization.   Insurance still needing stop date for IV antibiotics. LCSW notified MD via secure message.   LCSW will continue to follow.   Carolin Coy [facility] [place]

## 2018-12-05 NOTE — Progress Notes (Signed)
Chief Complaint: Patient was seen today for drain check   Supervising Physician: Markus Daft  Patient Status: [facility] - In-pt  Subjective: Pt had (L)psoas abscess drain placed on 1/8. Cx +pseudomonas Cyto negative Called to assess drain  Objective: Physical Exam: BP 127/80 (BP Location: Left Arm)   Pulse 100   Temp 99.1 F (37.3 C)   Resp 19   Ht 5' 8.5" (1.74 m)   Wt 75.2 kg   SpO2 97%   BMI 24.84 kg/m  (L)back drain appears intact though there is some focal skin induration and tenderness at insertion site. Suture appears intact, no obvious evidence of drain retraction. Output is thin dirty beige fluid.   Current Facility-Administered Medications:  .  acetaminophen (TYLENOL) tablet 650 mg, 650 mg, Oral, Q6H PRN, 650 mg at 12/01/18 1055 **OR** acetaminophen (TYLENOL) suppository 650 mg, 650 mg, Rectal, Q6H PRN, Danford, Suann Larry, MD .  calcium-vitamin D (OSCAL WITH D) 500-200 MG-UNIT per tablet 2 tablet, 2 tablet, Oral, BID, Georgette Shell, MD, 2 tablet at 12/05/18 0908 .  ceFEPIme (MAXIPIME) 2 g in sodium chloride 0.9 % 100 mL IVPB, 2 g, Intravenous, q1800, Polly Cobia, RPH, Last Rate: 200 mL/hr at 12/04/18 1645, 2 g at 12/04/18 1645 .  diphenhydrAMINE-zinc acetate (BENADRYL) 2-0.1 % cream, , Topical, BID PRN, Georgette Shell, MD, 1 application at 61/95/09 2245 .  heparin injection 5,000 Units, 5,000 Units, Subcutaneous, Q8H, Georgette Shell, MD, 5,000 Units at 12/04/18 0554 .  levothyroxine (SYNTHROID, LEVOTHROID) tablet 88 mcg, 88 mcg, Oral, QAC breakfast, Danford, Suann Larry, MD, 88 mcg at 12/05/18 0603 .  morphine 2 MG/ML injection 2 mg, 2 mg, Intravenous, Q2H PRN, Arrien, Jimmy Picket, MD, 2 mg at 12/05/18 1115 .  ondansetron (ZOFRAN) tablet 4 mg, 4 mg, Oral, Q6H PRN **OR** ondansetron (ZOFRAN) injection 4 mg, 4 mg, Intravenous, Q6H PRN, Danford, Suann Larry, MD .  oxyCODONE-acetaminophen (PERCOCET/ROXICET) 5-325 MG per tablet 1 tablet, 1  tablet, Oral, TID, Arrien, Jimmy Picket, MD, 1 tablet at 12/05/18 0908 .  sodium bicarbonate tablet 650 mg, 650 mg, Oral, BID, Danford, Suann Larry, MD, 650 mg at 12/05/18 0908 .  sodium chloride flush (NS) 0.9 % injection 10-40 mL, 10-40 mL, Intracatheter, PRN, Arrien, Jimmy Picket, MD, 20 mL at 12/05/18 0343 .  sodium chloride flush (NS) 0.9 % injection 5 mL, 5 mL, Intracatheter, Q8H, Greggory Keen, MD, 5 mL at 12/05/18 0603 .  tamsulosin (FLOMAX) capsule 0.4 mg, 0.4 mg, Oral, QHS, Blount, Xenia T, NP, 0.4 mg at 12/04/18 2100 .  Vitamin D (Ergocalciferol) (DRISDOL) capsule 50,000 Units, 50,000 Units, Oral, Q7 days, Danford, Suann Larry, MD, 50,000 Units at 12/02/18 3267  Labs: CBC Recent Labs    12/05/18 1000  WBC 12.5*  HGB 8.3*  HCT 26.7*  PLT 149*   BMET Recent Labs    12/04/18 0413 12/05/18 0345  NA 141 139  K 4.1 4.2  CL 113* 109  CO2 20* 20*  GLUCOSE 118* 98  BUN 34* 38*  CREATININE 2.17* 2.16*  CALCIUM 7.3* 7.9*   LFT No results for input(s): PROT, ALBUMIN, AST, ALT, ALKPHOS, BILITOT, BILIDIR, IBILI, LIPASE in the last 72 hours. PT/INR No results for input(s): LABPROT, INR in the last 72 hours.   Studies/Results: No results found.  Assessment/Plan: (L)psoas abscess s./p perc drain 1/8 Still having output from drain but given appearance at drain site as well as the fact it has been 1 week since placement, will order  new CT to reassess drain position and abscess progress.     LOS: 8 days   I spent a total of 15 minutes in face to face in clinical consultation, greater than 50% of which was counseling/coordinating care for psoas drain  Ascencion Dike PA-C 12/05/2018 11:48 AM

## 2018-12-05 NOTE — Evaluation (Signed)
Occupational Therapy Evaluation Patient Details Name: [name]. MRN: [id number] DOB: Dec 03, 1930 Today's Date: 12/05/2018    History of Present Illness 85yo M w/ PMH including AAA repair, BPH requiring I&O self cath, HTN, CKD, hearing loss who p/w lower abdominal pain, low back pain that radiates down his left leg.    Clinical Impression   Pt was seen for initial evaluation at bed level.  He c/o back pain and stated he was unable to get OOB at this time due to pain.  Assisted with hygiene as he had a BM and changed chux pads.  Pt progressively more agitated as session continued.  He was mod I and lived alone prior to admission.  Will follow in acute setting with min A level goals.  Pt will need SNF prior to home.    Follow Up Recommendations  SNF    Equipment Recommendations  (defer to next venue)    Recommendations for Other Services       Precautions / Restrictions Precautions Precautions: Fall Precaution Comments: pt reports h/o frequent falls, unable to give details Restrictions Weight Bearing Restrictions: No      Mobility Bed Mobility     Rolling: Min assist         General bed mobility comments: used chux pad to help pt roll completely  Transfers                 General transfer comment: NT    Balance                                           ADL either performed or assessed with clinical judgement   ADL Overall ADL's : Needs assistance/impaired Eating/Feeding: Set up   Grooming: Set up   Upper Body Bathing: Minimal assistance   Lower Body Bathing: Total assistance   Upper Body Dressing : Minimal assistance   Lower Body Dressing: Total assistance   Toilet Transfer: (NT: pain)   Toileting- Clothing Manipulation and Hygiene: Total assistance;Bed level         General ADL Comments: min A to roll for hygiene.  Unable to wipe himself.       Vision         Perception     Praxis      Pertinent  Vitals/Pain Pain Assessment: Faces Faces Pain Scale: Hurts whole lot Pain Location: back Pain Descriptors / Indicators: Moaning;Grimacing     Hand Dominance     Extremity/Trunk Assessment Upper Extremity Assessment Upper Extremity Assessment: Generalized weakness           Communication Communication Communication: HOH   Cognition Arousal/Alertness: Awake/alert Behavior During Therapy: Agitated Overall Cognitive Status: No family/caregiver present to determine baseline cognitive functioning                                 General Comments: followed commands, but several times said he didn't understand--pt is HOH.  easily self distracted    General Comments  BP 86/52    Exercises     Shoulder Instructions      Home Living Family/patient expects to be discharged to:: Unsure  Additional Comments: was home alone, mod I level      Prior Functioning/Environment                   OT Problem List: Decreased strength;Decreased activity tolerance;Pain;Decreased knowledge of use of DME or AE(balance NT)      OT Treatment/Interventions: Self-care/ADL training;Therapeutic exercise;DME and/or AE instruction;Patient/family education;Therapeutic activities(balance as needed)    OT Goals(Current goals can be found in the care plan section) Acute Rehab OT Goals Patient Stated Goal: be able to walk OT Goal Formulation: With patient Time For Goal Achievement: 12/19/18 Potential to Achieve Goals: Good ADL Goals Pt Will Transfer to Toilet: with min assist;stand pivot transfer;bedside commode Additional ADL Goal #1: pt will perform UB adls with set up sitting and LB with min A, using AE Additional ADL Goal #2: Pt will perform bed mobility at min A in preparation for adls  OT Frequency: Min 2X/week   Barriers to D/C: Decreased caregiver support          Co-evaluation PT/OT/SLP Co-Evaluation/Treatment:  Yes Reason for Co-Treatment: To address functional/ADL transfers PT goals addressed during session: Mobility/safety with mobility OT goals addressed during session: ADL's and self-care      AM-PAC OT "6 Clicks" Daily Activity     Outcome Measure Help from another person eating meals?: A Little Help from another person taking care of personal grooming?: A Little Help from another person toileting, which includes using toliet, bedpan, or urinal?: A Lot Help from another person bathing (including washing, rinsing, drying)?: A Lot Help from another person to put on and taking off regular upper body clothing?: A Lot Help from another person to put on and taking off regular lower body clothing?: Total 6 Click Score: 13   End of Session    Activity Tolerance: Treatment limited secondary to agitation;Patient limited by pain Patient left: in bed;with call bell/phone within reach;with bed alarm set  OT Visit Diagnosis: Muscle weakness (generalized) (M62.81)                Time: 6237-6283 OT Time Calculation (min): 15 min Charges:  OT General Charges $OT Visit: 1 Visit OT Evaluation $OT Eval Low Complexity: 1 Low  Lesle Chris, OTR/L Acute Rehabilitation Services [phone] [facility] pager [phone] office 12/05/2018  [place] 12/05/2018, 3:08 PM

## 2018-12-06 ENCOUNTER — Inpatient Hospital Stay (HOSPITAL_COMMUNITY): Payer: Medicare HMO

## 2018-12-06 LAB — IRON AND TIBC
Iron: 23 ug/dL — ABNORMAL LOW (ref 45–182)
Saturation Ratios: 22 % (ref 17.9–39.5)
TIBC: 103 ug/dL — ABNORMAL LOW (ref 250–450)
UIBC: 80 ug/dL

## 2018-12-06 LAB — FERRITIN: Ferritin: 579 ng/mL — ABNORMAL HIGH (ref 24–336)

## 2018-12-06 LAB — CBC
HCT: 28.3 % — ABNORMAL LOW (ref 39.0–52.0)
Hemoglobin: 8.7 g/dL — ABNORMAL LOW (ref 13.0–17.0)
MCH: 31.6 pg (ref 26.0–34.0)
MCHC: 30.7 g/dL (ref 30.0–36.0)
MCV: 102.9 fL — ABNORMAL HIGH (ref 80.0–100.0)
Platelets: 161 10*3/uL (ref 150–400)
RBC: 2.75 MIL/uL — ABNORMAL LOW (ref 4.22–5.81)
RDW: 18.9 % — AB (ref 11.5–15.5)
WBC: 12.2 10*3/uL — ABNORMAL HIGH (ref 4.0–10.5)
nRBC: 0 % (ref 0.0–0.2)

## 2018-12-06 LAB — ECHOCARDIOGRAM COMPLETE
Height: 68.5 in
Weight: 2652.57 oz

## 2018-12-06 LAB — BASIC METABOLIC PANEL
Anion gap: 10 (ref 5–15)
BUN: 39 mg/dL — ABNORMAL HIGH (ref 8–23)
CALCIUM: 8 mg/dL — AB (ref 8.9–10.3)
CO2: 20 mmol/L — ABNORMAL LOW (ref 22–32)
Chloride: 107 mmol/L (ref 98–111)
Creatinine, Ser: 2.17 mg/dL — ABNORMAL HIGH (ref 0.61–1.24)
GFR calc Af Amer: 31 mL/min — ABNORMAL LOW (ref >60)
GFR, EST NON AFRICAN AMERICAN: 27 mL/min — AB (ref >60)
Glucose, Bld: 122 mg/dL — ABNORMAL HIGH (ref 70–99)
Potassium: 4.1 mmol/L (ref 3.5–5.1)
SODIUM: 137 mmol/L (ref 135–145)

## 2018-12-06 LAB — RETICULOCYTES
Immature Retic Fract: 18.1 % — ABNORMAL HIGH (ref 2.3–15.9)
RBC.: 2.68 MIL/uL — AB (ref 4.22–5.81)
RETIC CT PCT: 3.8 % — AB (ref 0.4–3.1)
Retic Count, Absolute: 101 10*3/uL (ref 19.0–186.0)

## 2018-12-06 LAB — VITAMIN B12: Vitamin B-12: 663 pg/mL (ref 180–914)

## 2018-12-06 LAB — TSH: TSH: 8.78 u[IU]/mL — ABNORMAL HIGH (ref 0.350–4.500)

## 2018-12-06 LAB — FOLATE: Folate: 17.5 ng/mL (ref >5.9)

## 2018-12-06 MED ORDER — METOPROLOL TARTRATE 25 MG PO TABS
25.0000 mg | ORAL_TABLET | Freq: Two times a day (BID) | ORAL | Status: DC
  Filled 2018-12-06: qty 1

## 2018-12-06 MED ORDER — AMIODARONE HCL 200 MG PO TABS
200.0000 mg | ORAL_TABLET | Freq: Every day | ORAL | Status: DC
  Administered 2018-12-06: 200 mg via ORAL
  Filled 2018-12-06 (×2): qty 1

## 2018-12-06 MED ORDER — DIGOXIN 0.25 MG/ML IJ SOLN
0.2500 mg | Freq: Once | INTRAMUSCULAR | Status: AC
  Administered 2018-12-06: 0.25 mg via INTRAVENOUS
  Filled 2018-12-06: qty 1

## 2018-12-06 MED ORDER — ALBUMIN HUMAN 25 % IV SOLN
12.5000 g | Freq: Once | INTRAVENOUS | Status: AC
  Administered 2018-12-06: 12.5 g via INTRAVENOUS
  Filled 2018-12-06: qty 50

## 2018-12-06 MED ORDER — METOPROLOL TARTRATE 12.5 MG HALF TABLET
12.5000 mg | ORAL_TABLET | Freq: Two times a day (BID) | ORAL | Status: DC
  Administered 2018-12-06 (×2): 12.5 mg via ORAL
  Filled 2018-12-06 (×2): qty 1

## 2018-12-06 NOTE — Progress Notes (Signed)
Referring Physician(s): Regalado,B  Supervising Physician: Arne Cleveland  Patient Status:  [facility] - In-pt  Chief Complaint:  Left psoas abscess  Subjective: Patient cont to have some back discomfort but not severe; still has trouble processing why he is here and claims" I don't know what is going on" despite repeated discussions with him about care plan.   Allergies: Aspirin and Shellfish allergy  Medications: Prior to Admission medications   Medication Sig Start Date End Date Taking? Authorizing Provider  calcium-vitamin D 250-100 MG-UNIT tablet Take 1 tablet by mouth 2 (two) times daily.   Yes [provider]  levothyroxine (SYNTHROID, LEVOTHROID) 88 MCG tablet TAKE 1 TABLET EVERY MORNING Patient taking differently: Take 88 mcg by mouth daily before breakfast.  01/13/16  Yes Denita Lung, MD  sodium bicarbonate 650 MG tablet Take 650 mg by mouth 2 (two) times daily.   Yes [provider]  tamsulosin (FLOMAX) 0.4 MG CAPS capsule Take 0.4 mg by mouth at bedtime.   Yes [provider]  Vitamin D, Ergocalciferol, (DRISDOL) 50000 UNITS CAPS capsule Take 50,000 Units by mouth every 7 (seven) days. On Sunday   Yes [provider]  meropenem Navos) 1 g injection  08/08/18   [provider]  metoprolol tartrate (LOPRESSOR) 25 MG tablet  08/08/18   [provider]     Vital Signs: BP (!) 110/48   Pulse (!) 108   Temp 98.2 F (36.8 C)   Resp 18   Ht 5' 8.5" (1.74 m)   Wt 165 lb 12.6 oz (75.2 kg)   SpO2 100%   BMI 24.84 kg/m   Physical Exam left flank drain intact, small amount of beige-colored fluid in JP bulb.  Drain irrigated without significant difficulty.  Imaging: Ct Abdomen Pelvis Wo Contrast  Result Date: 12/05/2018 CLINICAL DATA:  Psoas abscess. EXAM: CT ABDOMEN AND PELVIS WITHOUT CONTRAST TECHNIQUE: Multidetector CT imaging of the abdomen and pelvis was performed following the standard protocol without IV  contrast. COMPARISON:  11/26/2018 FINDINGS: Lower chest: Moderate bilateral pleural effusions with dependent collapse/consolidation in the lower lobes bilaterally. Hepatobiliary: Left hepatic cyst. Gallbladder nondistended. No intrahepatic or extrahepatic biliary dilation. Pancreas: Diffusely atrophic. Similar appearance of the fluid density structure in the region of the pancreatic head/proximal duodenum. This has been present over multiple prior exams dating back to 09/26/2014 and described as stable back to 2008 on prior studies. This lesion is variably contained gas bubbles on today's study there is a suggest and may communicate with the gastric antrum or proximal duodenum. While chronic pseudocyst remains a consideration as described on prior studies, distal gastric or duodenal diverticulum might also be considered. Spleen: No splenomegaly. No focal mass lesion. Adrenals/Urinary Tract: No adrenal nodule or mass. Bilateral renal lesions of varying size and attenuation are identified, most of which are suggestive of cysts. No evidence for hydroureter. Bladder is distended with Foley catheter visualized. Stomach/Bowel: Stomach is distended with food. Duodenum is nondilated. Small bowel loops are minimally distended and fluid-filled. The appendix is normal. Moderate stool volume evident in the colon. Vascular/Lymphatic: Patient is status post abdominal aortic graft surgery. Haziness around the abdominal aorta is similar to prior. There is no gastrohepatic or hepatoduodenal ligament lymphadenopathy. No intraperitoneal or retroperitoneal lymphadenopathy. No pelvic sidewall lymphadenopathy. Reproductive: Not well seen due to streak artifact from left hip replacement. Other: Diffuse body wall edema has progressed in the interval. Musculoskeletal: Left psoas abscess has decreased in the interval measuring 2.1 x 4.2 cm  today compared to 6.2 x 7.7 cm previously. The formed loop of the percutaneous drain appears to be in  the posterior aspect of the abscess cavity. No worrisome lytic or sclerotic osseous abnormality. IMPRESSION: 1. Interval decrease in size of the left psoas abscess. 2. Interval progression of body wall edema. 3. Interval progression of bilateral pleural effusions now moderate bilaterally with adjacent lower lobe collapse/consolidation. 4. Otherwise stable exam. Electronically Signed   By: Misty Stanley M.D.   On: 12/05/2018 14:20    Labs:  CBC: Recent Labs    12/01/18 0700 12/02/18 0317 12/05/18 1000 12/06/18 0425  WBC 8.8 8.6 12.5* 12.2*  HGB 8.2* 8.4* 8.3* 8.7*  HCT 26.2* 26.4* 26.7* 28.3*  PLT 142* 170 149* 161    COAGS: Recent Labs    11/28/18 0554  INR 1.34    BMP: Recent Labs    12/03/18 0318 12/04/18 0413 12/05/18 0345 12/06/18 0425  NA 140 141 139 137  K 4.2 4.1 4.2 4.1  CL 113* 113* 109 107  CO2 18* 20* 20* 20*  GLUCOSE 168* 118* 98 122*  BUN 36* 34* 38* 39*  CALCIUM 7.3* 7.3* 7.9* 8.0*  CREATININE 2.25* 2.17* 2.16* 2.17*  GFRNONAA 26* 27* 27* 27*  GFRAA 30* 31* 31* 31*    LIVER FUNCTION TESTS: Recent Labs    11/19/18 1556 11/19/18 1718 11/26/18 0216 11/27/18 0558 11/29/18 0612  BILITOT 0.8 0.6 0.7  --  0.9  AST 18 16 16   --  10*  ALT 10 10 9   --  8  ALKPHOS 81 70 80  --  59  PROT 6.3* 5.7* 6.1*  --  4.9*  ALBUMIN 2.6* 2.3* 2.5* 2.0* 2.0*    Assessment and Plan: Patient status post left psoas/retroperitoneal abscess drain placement on 11/28/2018; afebrile, WBC 12.2 (12.5), hemoglobin 8.7, creatinine 2.17, drain fluid cultures growing Pseudomonas; latest CT scan of abdomen pelvis reviewed today with Dr. Vernard Gambles; reveals decrease in size of left psoas abscess but not complete resolution.  Recommend continued irrigation of drain with 5 cc sterile normal saline twice daily, output recording and dressing changes every 1 to 2 days.  Will need follow-up CT scan in 1 week.  Antibiotic coverage per IM/ID.   Electronically Signed: D. Rowe Robert,  PA-C 12/06/2018, 4:53 PM   I spent a total of 15 minutes at the the patient's bedside AND on the patient's hospital floor or unit, greater than 50% of which was counseling/coordinating care for left psoas abscess drain    Patient ID: [name]., male   DOB: [birth year], 85 y.o.   MRN: [id number]

## 2018-12-06 NOTE — Progress Notes (Signed)
PROGRESS NOTE    [name].  IOX:[id number] DOB: Mar 19, 1931 DOA: 11/26/2018 PCP: Mackie Pai, PA-C    Brief Narrative; 85 year old with past medical history significant for blindness, chronic kidney disease a stage IV, A. fib, hypothyroidism, BPH, abdominal aortic aneurysm status post repair who presented with left flank pain and leg pain.  Patient was admitted with a working diagnosis of retroperitoneal mass rule out abscess..  Patient noted to be in A. fib RVR today systolic blood pressure in the 100 range.  Cardiology consulted.  Assessment & Plan:   Principal Problem:   Dehydration Active Problems:   Hypothyroid   CKD (chronic kidney disease), stage III (HCC)   Hypocalcemia   Urinary tract infection associated with indwelling urethral catheter (HCC)   Back pain   Retroperitoneal mass   Anemia   Left psoas muscle abscess due to severe pneumonia; Present on admission.. Patient has been on IV cefepime. Patient underwent left psoas abscess drain placed by IR on January 8, 202020. Drain site, leaking.  IR consulted for reevaluation.. Recommend a recent repeat a CT scan.  This can show decrease in size of left psoas abscess. I discussed case with Dr. Graylon Good, patient will likely need 1 more week of IV antibiotics. He will need close follow-up with IR.  Hypothyroidism; Continue with Synthroid  A. fib with RVR; His metoprolol has been on hold because of low systolic blood pressure. Cardiology consulted. Discussed with Dr. Doylene Canard and he recommended IV digoxin x1.  He will see patient in consultation.  Hypotension;  This has could be related to morphine. Lactic acid  was normal. Systolic blood pressure today in the 100 range.  Chronic kidney disease stage IV; Renal function has been stable. When blood pressure allows will try to do Lasix  Bilateral pleural effusion; with Adjacent lobe collapse consolidation Hold IV fluids. Incentive spirometry We will try IV  Lasix when blood pressure stable Repeat chest x-ray tomorrow Patient is not hypoxic  BPH/ sp TURP 2012, hx of bulbar urethral stricturewith chronic indwelling foley catheter Intermittent discomfort with Foley catheter that improved with flushing.Marland Kitchen  Hypertension; blood pressure has been controlled off of medication.  UTI due to enterococcus, related to chronic indwelling Foley catheter He received treatment with ampicillin.                Estimated body mass index is 24.84 kg/m as calculated from the following:   Height as of this encounter: 5' 8.5" (1.74 m).   Weight as of this encounter: 75.2 kg.   DVT prophylaxis: Heparin Code Status: Full code Family Communication: No family at bedside Disposition Plan: Continued for IV antibiotics, stabilization of blood pressure, need PT, needed SNF placement.  Consultants:   IR   Procedures:    Antimicrobials:   Cefepime   Subjective: Still compliant complaining of back pain. He denies shortness of breath, chest pain. Objective: Vitals:   12/05/18 2034 12/06/18 0359 12/06/18 1116 12/06/18 1148  BP: 123/74 134/64  (!) 100/46  Pulse: (!) 106 90 (!) 110   Resp: 20 16    Temp: 97.9 F (36.6 C) 98.4 F (36.9 C)    TempSrc: Oral Oral    SpO2: 100% 96%    Weight:      Height:        Intake/Output Summary (Last 24 hours) at 12/06/2018 1433 Last data filed at 12/06/2018 1201 Gross per 24 hour  Intake 1210 ml  Output 352 ml  Net 858 ml   Filed  Weights   11/26/18 0208  Weight: 75.2 kg    Examination:  General exam: No acute distress Respiratory system: Bilateral decreased sounds. Cardiovascular system: 1, S2 regular rhythm and rate Gastrointestinal system: Bowel sounds present soft, nontender nondistended Central nervous system: Alert and oriented.  Back with drain in place, Extremities: Symmetric power   Data Reviewed: I have personally reviewed following labs and imaging studies  CBC: Recent Labs    Lab 12-04-18 0700 12/02/18 0317 12/05/18 1000 12/06/18 0425  WBC 8.8 8.6 12.5* 12.2*  NEUTROABS  --  6.6  --   --   HGB 8.2* 8.4* 8.3* 8.7*  HCT 26.2* 26.4* 26.7* 28.3*  MCV 101.6* 100.0 102.7* 102.9*  PLT 142* 170 149* 606   Basic Metabolic Panel: Recent Labs  Lab 12/02/18 0317 12/03/18 0318 12/04/18 0413 12/05/18 0345 12/06/18 0425  NA 138 140 141 139 137  K 3.8 4.2 4.1 4.2 4.1  CL 114* 113* 113* 109 107  CO2 18* 18* 20* 20* 20*  GLUCOSE 87 168* 118* 98 122*  BUN 39* 36* 34* 38* 39*  CREATININE 2.09* 2.25* 2.17* 2.16* 2.17*  CALCIUM 7.0* 7.3* 7.3* 7.9* 8.0*   GFR: Estimated Creatinine Clearance: 24.5 mL/min (A) (by C-G formula based on SCr of 2.17 mg/dL (H)). Liver Function Tests: No results for input(s): AST, ALT, ALKPHOS, BILITOT, PROT, ALBUMIN in the last 168 hours. No results for input(s): LIPASE, AMYLASE in the last 168 hours. No results for input(s): AMMONIA in the last 168 hours. Coagulation Profile: No results for input(s): INR, PROTIME in the last 168 hours. Cardiac Enzymes: No results for input(s): CKTOTAL, CKMB, CKMBINDEX, TROPONINI in the last 168 hours. BNP (last 3 results) Recent Labs    08/22/18 1224  PROBNP 371.0*   HbA1C: No results for input(s): HGBA1C in the last 72 hours. CBG: No results for input(s): GLUCAP in the last 168 hours. Lipid Profile: No results for input(s): CHOL, HDL, LDLCALC, TRIG, CHOLHDL, LDLDIRECT in the last 72 hours. Thyroid Function Tests: No results for input(s): TSH, T4TOTAL, FREET4, T3FREE, THYROIDAB in the last 72 hours. Anemia Panel: No results for input(s): VITAMINB12, FOLATE, FERRITIN, TIBC, IRON, RETICCTPCT in the last 72 hours. Sepsis Labs: Recent Labs  Lab 12/05/18 1609  LATICACIDVEN 1.3    Recent Results (from the past 240 hour(s))  Aerobic/Anaerobic Culture (surgical/deep wound)     Status: None   Collection Time: 11/28/18  9:54 AM  Result Value Ref Range Status   Specimen Description   Final     ABSCESS LEFT PSOAS Performed at [facility] [address], [place], [place] [address]    Special Requests   Final    Normal Performed at [facility], [place] [address], [place], [place] [address]    Gram Stain   Final    ABUNDANT WBC PRESENT, PREDOMINANTLY PMN RARE GRAM NEGATIVE RODS    Culture   Final    MODERATE PSEUDOMONAS AERUGINOSA NO ANAEROBES ISOLATED Performed at [facility], [place] [address], [place], [place] [address]    Report Status 12/03/2018 FINAL  Final   Organism ID, Bacteria PSEUDOMONAS AERUGINOSA  Final      Susceptibility   Pseudomonas aeruginosa - MIC*    CEFTAZIDIME 4 SENSITIVE Sensitive     CIPROFLOXACIN >=4 RESISTANT Resistant     GENTAMICIN >=16 RESISTANT Resistant     IMIPENEM >=16 RESISTANT Resistant     CEFEPIME 8 SENSITIVE Sensitive     * MODERATE PSEUDOMONAS AERUGINOSA  Radiology Studies: Ct Abdomen Pelvis Wo Contrast  Result Date: 12/05/2018 CLINICAL DATA:  Psoas abscess. EXAM: CT ABDOMEN AND PELVIS WITHOUT CONTRAST TECHNIQUE: Multidetector CT imaging of the abdomen and pelvis was performed following the standard protocol without IV contrast. COMPARISON:  11/26/2018 FINDINGS: Lower chest: Moderate bilateral pleural effusions with dependent collapse/consolidation in the lower lobes bilaterally. Hepatobiliary: Left hepatic cyst. Gallbladder nondistended. No intrahepatic or extrahepatic biliary dilation. Pancreas: Diffusely atrophic. Similar appearance of the fluid density structure in the region of the pancreatic head/proximal duodenum. This has been present over multiple prior exams dating back to 09/26/2014 and described as stable back to 2008 on prior studies. This lesion is variably contained gas bubbles on today's study there is a suggest and may communicate with the gastric antrum or proximal duodenum. While chronic pseudocyst remains a consideration as described on prior studies, distal gastric or  duodenal diverticulum might also be considered. Spleen: No splenomegaly. No focal mass lesion. Adrenals/Urinary Tract: No adrenal nodule or mass. Bilateral renal lesions of varying size and attenuation are identified, most of which are suggestive of cysts. No evidence for hydroureter. Bladder is distended with Foley catheter visualized. Stomach/Bowel: Stomach is distended with food. Duodenum is nondilated. Small bowel loops are minimally distended and fluid-filled. The appendix is normal. Moderate stool volume evident in the colon. Vascular/Lymphatic: Patient is status post abdominal aortic graft surgery. Haziness around the abdominal aorta is similar to prior. There is no gastrohepatic or hepatoduodenal ligament lymphadenopathy. No intraperitoneal or retroperitoneal lymphadenopathy. No pelvic sidewall lymphadenopathy. Reproductive: Not well seen due to streak artifact from left hip replacement. Other: Diffuse body wall edema has progressed in the interval. Musculoskeletal: Left psoas abscess has decreased in the interval measuring 2.1 x 4.2 cm today compared to 6.2 x 7.7 cm previously. The formed loop of the percutaneous drain appears to be in the posterior aspect of the abscess cavity. No worrisome lytic or sclerotic osseous abnormality. IMPRESSION: 1. Interval decrease in size of the left psoas abscess. 2. Interval progression of body wall edema. 3. Interval progression of bilateral pleural effusions now moderate bilaterally with adjacent lower lobe collapse/consolidation. 4. Otherwise stable exam. Electronically Signed   By: Misty Stanley M.D.   On: 12/05/2018 14:20        Scheduled Meds: . amiodarone  200 mg Oral Daily  . calcium-vitamin D  2 tablet Oral BID  . heparin injection (subcutaneous)  5,000 Units Subcutaneous Q8H  . levothyroxine  88 mcg Oral QAC breakfast  . metoprolol tartrate  12.5 mg Oral BID  . oxyCODONE-acetaminophen  1 tablet Oral TID  . sodium bicarbonate  650 mg Oral BID  .  sodium chloride flush  5 mL Intracatheter Q8H  . tamsulosin  0.4 mg Oral QHS  . Vitamin D (Ergocalciferol)  50,000 Units Oral Q7 days   Continuous Infusions: . albumin human    . ceFEPime (MAXIPIME) IV 2 g (12/05/18 1736)     LOS: 9 days    Time spent: 35 minutes    Avyukt Cimo A Satvik Parco, MD [facility]   If 7PM-7AM, please contact night-coverage [email or web address] Password [facility] 12/06/2018, 2:33 PM

## 2018-12-06 NOTE — Consult Note (Addendum)
Referring Physician:  [name]. is an 85 y.o. male.                       Chief Complaint: Atrial fibrillation  HPI: 85 year old male with left psoas muscle abscess due to pneumonia underwent Psoas abscess drainage on 11/28/2018. He has CKD, stage IV, bilateral pleural effusion, hypotension, hypothyroidism has atrial fibrillation with moderately high ventricular response rate of 120's. He is off his beta-blocker due to hypotension from possible sepsis. He has severe hypoproteinemia and hypoalbuminemia with severe malnutrition.  Past Medical History:  Diagnosis Date  . AAA (abdominal aortic aneurysm) ([place])   . Arthritis   . BPH (benign prostatic hyperplasia)   . Complication of anesthesia    "a tough time"  -pt unable to explain  . Glucose intolerance (impaired glucose tolerance)   . Hypertension   . Hypothyroid   . Pancreatitis 15-20 yrs ago  . Peripheral neuropathy    fingers and toes  . Renal insufficiency       Past Surgical History:  Procedure Laterality Date  . ABDOMINAL AORTAGRAM N/A 05/06/2014   Procedure: ABDOMINAL Maxcine Ham;  Surgeon: Serafina Mitchell, MD;  Location: [facility] CATH LAB;  Service: Cardiovascular;  Laterality: N/A;  . ABDOMINAL AORTIC ANEURYSM REPAIR  july 2011  . AMPUTATION Right 01/03/2013   Procedure: AMPUTATION DIGIT;  Surgeon: Wylene Simmer, MD;  Location: [facility];  Service: Orthopedics;  Laterality: Right;  RIGHT 2ND TOE AMPUTATION  . HIP PINNING,CANNULATED Left 08/08/2013   Procedure: LEFT CANNULATED HIP PINNING;  Surgeon: Rozanna Box, MD;  Location: [facility];  Service: Orthopedics;  Laterality: Left;  . PROSTATE SURGERY  5 yrs ago   urethral dilation  . STERIOD INJECTION Bilateral 08/08/2013   Procedure: STEROID INJECTION;  Surgeon: Rozanna Box, MD;  Location: [facility];  Service: Orthopedics;  Laterality: Bilateral;  . TOTAL HIP REVISION Left 02/10/2014   Procedure: REMOVAL OF SYNTHESE SCREWS LEFT HIP AND CONVERSION LEFT TOTAL HIP ARTHROPLASTY;  Surgeon:  Mauri Pole, MD;  Location: [facility];  Service: Orthopedics;  Laterality: Left;    Family History  Problem Relation Age of Onset  . Heart attack Mother   . Heart disease Mother   . Heart attack Father   . Diabetes Son        amputation-BKA    Social History:  reports that he quit smoking about 6 years ago. He has a 18.00 pack-year smoking history. He has never used smokeless tobacco. He reports current alcohol use. He reports that he does not use drugs.  Allergies:  Allergies  Allergen Reactions  . Aspirin Other (See Comments)    Low bp, stomach pains  . Shellfish Allergy     Medications Prior to Admission  Medication Sig Dispense Refill  . calcium-vitamin D 250-100 MG-UNIT tablet Take 1 tablet by mouth 2 (two) times daily.    Marland Kitchen levothyroxine (SYNTHROID, LEVOTHROID) 88 MCG tablet TAKE 1 TABLET EVERY MORNING (Patient taking differently: Take 88 mcg by mouth daily before breakfast. ) 90 tablet 0  . sodium bicarbonate 650 MG tablet Take 650 mg by mouth 2 (two) times daily.    . tamsulosin (FLOMAX) 0.4 MG CAPS capsule Take 0.4 mg by mouth at bedtime.    . Vitamin D, Ergocalciferol, (DRISDOL) 50000 UNITS CAPS capsule Take 50,000 Units by mouth every 7 (seven) days. On Sunday    . meropenem (MERREM) 1 g injection     . metoprolol  tartrate (LOPRESSOR) 25 MG tablet   5    Results for orders placed or performed during the hospital encounter of 11/26/18 (from the past 48 hour(s))  Basic metabolic panel     Status: Abnormal   Collection Time: 12/05/18  3:45 AM  Result Value Ref Range   Sodium 139 135 - 145 mmol/L   Potassium 4.2 3.5 - 5.1 mmol/L   Chloride 109 98 - 111 mmol/L   CO2 20 (L) 22 - 32 mmol/L   Glucose, Bld 98 70 - 99 mg/dL   BUN 38 (H) 8 - 23 mg/dL   Creatinine, Ser 2.16 (H) 0.61 - 1.24 mg/dL   Calcium 7.9 (L) 8.9 - 10.3 mg/dL   GFR calc non Af Amer 27 (L) >60 mL/min   GFR calc Af Amer 31 (L) >60 mL/min   Anion gap 10 5 - 15    Comment: Performed at [facility], [place] [address], [place], [place] [address]  CBC     Status: Abnormal   Collection Time: 12/05/18 10:00 AM  Result Value Ref Range   WBC 12.5 (H) 4.0 - 10.5 K/uL   RBC 2.60 (L) 4.22 - 5.81 MIL/uL   Hemoglobin 8.3 (L) 13.0 - 17.0 g/dL   HCT 26.7 (L) 39.0 - 52.0 %   MCV 102.7 (H) 80.0 - 100.0 fL   MCH 31.9 26.0 - 34.0 pg   MCHC 31.1 30.0 - 36.0 g/dL   RDW 18.9 (H) 11.5 - 15.5 %   Platelets 149 (L) 150 - 400 K/uL   nRBC 0.0 0.0 - 0.2 %    Comment: Performed at [facility], [place] [address] [place], [place] [address]  Lactic acid, plasma     Status: None   Collection Time: 12/05/18  4:09 PM  Result Value Ref Range   Lactic Acid, Venous 1.3 0.5 - 1.9 mmol/L    Comment: Performed at [facility], [place] [address] [place], [place] [address]  CBC     Status: Abnormal   Collection Time: 12/06/18  4:25 AM  Result Value Ref Range   WBC 12.2 (H) 4.0 - 10.5 K/uL   RBC 2.75 (L) 4.22 - 5.81 MIL/uL   Hemoglobin 8.7 (L) 13.0 - 17.0 g/dL   HCT 28.3 (L) 39.0 - 52.0 %   MCV 102.9 (H) 80.0 - 100.0 fL   MCH 31.6 26.0 - 34.0 pg   MCHC 30.7 30.0 - 36.0 g/dL   RDW 18.9 (H) 11.5 - 15.5 %   Platelets 161 150 - 400 K/uL   nRBC 0.0 0.0 - 0.2 %    Comment: Performed at [facility], [place] [address] [place], [place] [address]  Basic metabolic panel     Status: Abnormal   Collection Time: 12/06/18  4:25 AM  Result Value Ref Range   Sodium 137 135 - 145 mmol/L   Potassium 4.1 3.5 - 5.1 mmol/L   Chloride 107 98 - 111 mmol/L   CO2 20 (L) 22 - 32 mmol/L   Glucose, Bld 122 (H) 70 - 99 mg/dL   BUN 39 (H) 8 - 23 mg/dL   Creatinine, Ser 2.17 (H) 0.61 - 1.24 mg/dL   Calcium 8.0 (L) 8.9 - 10.3 mg/dL   GFR calc non Af Amer 27 (L) >60 mL/min   GFR calc Af Amer 31 (L) >60 mL/min   Anion gap 10 5 - 15    Comment: Performed at [facility], [place], [address] [place], [place] [address]   Ct Abdomen Pelvis Wo  Contrast  Result Date: 12/05/2018 CLINICAL DATA:  Psoas abscess. EXAM: CT ABDOMEN AND PELVIS WITHOUT CONTRAST TECHNIQUE: Multidetector CT imaging of the abdomen and pelvis was performed following the standard protocol without IV contrast. COMPARISON:  11/26/2018 FINDINGS: Lower chest: Moderate bilateral pleural effusions with dependent collapse/consolidation in the lower lobes bilaterally. Hepatobiliary: Left hepatic cyst. Gallbladder nondistended. No intrahepatic or extrahepatic biliary dilation. Pancreas: Diffusely atrophic. Similar appearance of the fluid density structure in the region of the pancreatic head/proximal duodenum. This has been present over multiple prior exams dating back to 09/26/2014 and described as stable back to 2008 on prior studies. This lesion is variably contained gas bubbles on today's study there is a suggest and may communicate with the gastric antrum or proximal duodenum. While chronic pseudocyst remains a consideration as described on prior studies, distal gastric or duodenal diverticulum might also be considered. Spleen: No splenomegaly. No focal mass lesion. Adrenals/Urinary Tract: No adrenal nodule or mass. Bilateral renal lesions of varying size and attenuation are identified, most of which are suggestive of cysts. No evidence for hydroureter. Bladder is distended with Foley catheter visualized. Stomach/Bowel: Stomach is distended with food. Duodenum is nondilated. Small bowel loops are minimally distended and fluid-filled. The appendix is normal. Moderate stool volume evident in the colon. Vascular/Lymphatic: Patient is status post abdominal aortic graft surgery. Haziness around the abdominal aorta is similar to prior. There is no gastrohepatic or hepatoduodenal ligament lymphadenopathy. No intraperitoneal or retroperitoneal lymphadenopathy. No pelvic sidewall lymphadenopathy. Reproductive: Not well seen due to streak artifact from left hip replacement. Other: Diffuse body wall  edema has progressed in the interval. Musculoskeletal: Left psoas abscess has decreased in the interval measuring 2.1 x 4.2 cm today compared to 6.2 x 7.7 cm previously. The formed loop of the percutaneous drain appears to be in the posterior aspect of the abscess cavity. No worrisome lytic or sclerotic osseous abnormality. IMPRESSION: 1. Interval decrease in size of the left psoas abscess. 2. Interval progression of body wall edema. 3. Interval progression of bilateral pleural effusions now moderate bilaterally with adjacent lower lobe collapse/consolidation. 4. Otherwise stable exam. Electronically Signed   By: Misty Stanley M.D.   On: 12/05/2018 14:20    Review Of Systems Constitutional: Positive fever, chills, weight loss. Eyes: No vision change, wears glasses. No discharge or pain. Ears: No hearing loss, No tinnitus. Respiratory: No asthma, COPD, pneumonias. Positive shortness of breath. No hemoptysis. Cardiovascular: Positive chest pain, palpitation, leg edema. Gastrointestinal: No nausea, vomiting, diarrhea, constipation. No GI bleed. No hepatitis. Genitourinary: No dysuria, hematuria, kidney stone. No incontinance. Neurological: No headache, stroke, seizures.  Psychiatry: No psych facility admission for anxiety, depression, suicide. No detox. Skin: No rash. Musculoskeletal: Positive joint pain, fibromyalgia, neck pain, back pain. Loss of few toes both feet. Lymphadenopathy: No lymphadenopathy. Hematology: Positive anemia or easy bruising.   Blood pressure (!) 100/46, pulse (!) 110, temperature 98.4 F (36.9 C), temperature source Oral, resp. rate 16, height 5' 8.5" (1.74 m), weight 75.2 kg, SpO2 96 %. Body mass index is 24.84 kg/m. General appearance: alert, cooperative, appears stated age and mild distress Head: Normocephalic, atraumatic. Eyes: Blue eyes, pink conjunctiva, corneas clear. Neck: No adenopathy, no carotid bruit, no JVD, supple, symmetrical, trachea midline and thyroid  not enlarged. Resp: Clearing to auscultation bilaterally. Cardio: Irregular rate and rhythm, S1, S2 normal, II/VI systolic murmur, no click, rub or gallop GI: Soft, non-tender; bowel sounds normal; no organomegaly.  Extremities: Trace edema, no cyanosis or clubbing. Skin: Warm and dry.  Neurologic: Alert and oriented X 2, moves all four extremities with mild contracture.  Assessment/Plan Atrial fibrillation with moderate ventricular response, CHA2DS2VASc score of 4. Hypotension from possible sepsis Left Psoas abscess with drainage Hypothyroidism Chronic iron deficiency anemia CKD, IV Bilateral pleural effusion UTI with indwelling catheter PVD  Small dose lanoxin, metoprolol and possible amiodarone for heart rate control. Thyroid function test. Hold anticoagulation till Hgb improves.  Birdie Riddle, MD  12/06/2018, 1:18 PM

## 2018-12-06 NOTE — Progress Notes (Signed)
Echocardiogram 2D Echocardiogram has been performed.  [name] 12/06/2018, 2:29 PM

## 2018-12-06 NOTE — Care Management Important Message (Signed)
Important Message  Patient Details  Name: [name]. MRN: [id number] Date of Birth: Mar 27, 1931   Medicare Important Message Given:  Yes    Kerin Salen 12/06/2018, 10:51 AMImportant Message  Patient Details  Name: [name]. MRN: [id number] Date of Birth: Oct 20, 1931   Medicare Important Message Given:  Yes    Kerin Salen 12/06/2018, 10:51 AM

## 2018-12-07 ENCOUNTER — Inpatient Hospital Stay (HOSPITAL_COMMUNITY): Payer: Medicare HMO

## 2018-12-07 DIAGNOSIS — M5442 Lumbago with sciatica, left side: Secondary | ICD-10-CM

## 2018-12-07 DIAGNOSIS — Z87891 Personal history of nicotine dependence: Secondary | ICD-10-CM

## 2018-12-07 DIAGNOSIS — E43 Unspecified severe protein-calorie malnutrition: Secondary | ICD-10-CM

## 2018-12-07 DIAGNOSIS — I4891 Unspecified atrial fibrillation: Secondary | ICD-10-CM

## 2018-12-07 DIAGNOSIS — R1909 Other intra-abdominal and pelvic swelling, mass and lump: Secondary | ICD-10-CM

## 2018-12-07 DIAGNOSIS — I313 Pericardial effusion (noninflammatory): Secondary | ICD-10-CM

## 2018-12-07 DIAGNOSIS — Z886 Allergy status to analgesic agent status: Secondary | ICD-10-CM

## 2018-12-07 DIAGNOSIS — K6812 Psoas muscle abscess: Secondary | ICD-10-CM

## 2018-12-07 DIAGNOSIS — H547 Unspecified visual loss: Secondary | ICD-10-CM

## 2018-12-07 DIAGNOSIS — N400 Enlarged prostate without lower urinary tract symptoms: Secondary | ICD-10-CM

## 2018-12-07 DIAGNOSIS — R1900 Intra-abdominal and pelvic swelling, mass and lump, unspecified site: Secondary | ICD-10-CM

## 2018-12-07 DIAGNOSIS — E039 Hypothyroidism, unspecified: Secondary | ICD-10-CM

## 2018-12-07 DIAGNOSIS — J90 Pleural effusion, not elsewhere classified: Secondary | ICD-10-CM

## 2018-12-07 DIAGNOSIS — N184 Chronic kidney disease, stage 4 (severe): Secondary | ICD-10-CM

## 2018-12-07 DIAGNOSIS — Z87440 Personal history of urinary (tract) infections: Secondary | ICD-10-CM

## 2018-12-07 DIAGNOSIS — D72829 Elevated white blood cell count, unspecified: Secondary | ICD-10-CM

## 2018-12-07 DIAGNOSIS — Z978 Presence of other specified devices: Secondary | ICD-10-CM

## 2018-12-07 DIAGNOSIS — Z91013 Allergy to seafood: Secondary | ICD-10-CM

## 2018-12-07 LAB — MRSA PCR SCREENING: MRSA by PCR: NEGATIVE

## 2018-12-07 LAB — COMPREHENSIVE METABOLIC PANEL
ALT: 7 U/L (ref 0–44)
AST: 10 U/L — ABNORMAL LOW (ref 15–41)
Albumin: 2 g/dL — ABNORMAL LOW (ref 3.5–5.0)
Alkaline Phosphatase: 63 U/L (ref 38–126)
Anion gap: 9 (ref 5–15)
BILIRUBIN TOTAL: 0.6 mg/dL (ref 0.3–1.2)
BUN: 42 mg/dL — ABNORMAL HIGH (ref 8–23)
CALCIUM: 8.1 mg/dL — AB (ref 8.9–10.3)
CO2: 21 mmol/L — ABNORMAL LOW (ref 22–32)
Chloride: 107 mmol/L (ref 98–111)
Creatinine, Ser: 2.32 mg/dL — ABNORMAL HIGH (ref 0.61–1.24)
GFR calc Af Amer: 29 mL/min — ABNORMAL LOW (ref >60)
GFR calc non Af Amer: 25 mL/min — ABNORMAL LOW (ref >60)
GLUCOSE: 104 mg/dL — AB (ref 70–99)
Potassium: 4.2 mmol/L (ref 3.5–5.1)
Sodium: 137 mmol/L (ref 135–145)
TOTAL PROTEIN: 4.9 g/dL — AB (ref 6.5–8.1)

## 2018-12-07 LAB — T4, FREE: Free T4: 0.96 ng/dL (ref 0.82–1.77)

## 2018-12-07 MED ORDER — ALBUMIN HUMAN 25 % IV SOLN
12.5000 g | Freq: Once | INTRAVENOUS | Status: AC
  Administered 2018-12-07: 12.5 g via INTRAVENOUS
  Filled 2018-12-07: qty 50

## 2018-12-07 MED ORDER — HEPARIN SODIUM (PORCINE) 5000 UNIT/ML IJ SOLN: 5000.0000 [IU] | Freq: Three times a day (TID) | INTRAMUSCULAR | Status: DC

## 2018-12-07 MED ORDER — AMIODARONE HCL 200 MG PO TABS
400.0000 mg | ORAL_TABLET | Freq: Every day | ORAL | Status: DC
  Administered 2018-12-07: 400 mg via ORAL
  Filled 2018-12-07: qty 2

## 2018-12-07 MED ORDER — AMIODARONE HCL 200 MG PO TABS
200.0000 mg | ORAL_TABLET | Freq: Every day | ORAL | Status: DC
  Administered 2018-12-08 – 2018-12-10 (×3): 200 mg via ORAL
  Filled 2018-12-07 (×3): qty 1

## 2018-12-07 MED ORDER — ENSURE ENLIVE PO LIQD
237.0000 mL | Freq: Two times a day (BID) | ORAL | Status: DC
  Administered 2018-12-08 – 2018-12-10 (×5): 237 mL via ORAL

## 2018-12-07 MED ORDER — COLCHICINE 0.6 MG PO TABS
0.6000 mg | ORAL_TABLET | Freq: Every day | ORAL | Status: DC
  Administered 2018-12-07 – 2018-12-10 (×4): 0.6 mg via ORAL
  Filled 2018-12-07 (×4): qty 1

## 2018-12-07 MED ORDER — FUROSEMIDE 40 MG PO TABS
40.0000 mg | ORAL_TABLET | Freq: Every day | ORAL | Status: DC
  Filled 2018-12-07: qty 1

## 2018-12-07 MED ORDER — SODIUM CHLORIDE 0.9 % IV SOLN
2.0000 g | INTRAVENOUS | Status: DC
  Administered 2018-12-08 – 2018-12-09 (×3): 2 g via INTRAVENOUS
  Filled 2018-12-07 (×5): qty 2

## 2018-12-07 MED ORDER — METOPROLOL TARTRATE 25 MG PO TABS
25.0000 mg | ORAL_TABLET | Freq: Two times a day (BID) | ORAL | Status: DC
  Filled 2018-12-07 (×2): qty 1

## 2018-12-07 MED ORDER — FERROUS SULFATE 325 (65 FE) MG PO TABS
325.0000 mg | ORAL_TABLET | Freq: Two times a day (BID) | ORAL | Status: DC
  Administered 2018-12-07 – 2018-12-10 (×6): 325 mg via ORAL
  Filled 2018-12-07 (×6): qty 1

## 2018-12-07 MED ORDER — FUROSEMIDE 10 MG/ML IJ SOLN
40.0000 mg | Freq: Two times a day (BID) | INTRAMUSCULAR | Status: DC
  Filled 2018-12-07: qty 4

## 2018-12-07 MED ORDER — ALBUMIN HUMAN 25 % IV SOLN
12.5000 g | Freq: Once | INTRAVENOUS | Status: DC
  Filled 2018-12-07: qty 50

## 2018-12-07 MED ORDER — ADULT MULTIVITAMIN W/MINERALS CH
1.0000 | ORAL_TABLET | Freq: Every day | ORAL | Status: DC
  Administered 2018-12-08 – 2018-12-09 (×2): 1 via ORAL
  Filled 2018-12-07 (×2): qty 1

## 2018-12-07 MED ORDER — SODIUM CHLORIDE 0.9 % IV SOLN
INTRAVENOUS | Status: DC | PRN
  Administered 2018-12-08: via INTRAVENOUS
  Administered 2018-12-08 – 2018-12-09 (×2): 250 mL via INTRAVENOUS

## 2018-12-07 NOTE — Progress Notes (Signed)
Initial Nutrition Assessment  DOCUMENTATION CODES:   Severe malnutrition in context of chronic illness  INTERVENTION:    Ensure Enlive po BID, each supplement provides 350 kcal and 20 grams of protein  Magic cup TID with meals, each supplement provides 290 kcal and 9 grams of protein  MVI daily  NUTRITION DIAGNOSIS:   Severe Malnutrition related to chronic illness(CHF/CKD ) as evidenced by severe fat depletion, severe muscle depletion.  GOAL:   Patient will meet greater than or equal to 90% of their needs  MONITOR:   PO intake, Supplement acceptance, Weight trends, Labs  REASON FOR ASSESSMENT:   Consult Assessment of nutrition requirement/status  ASSESSMENT:   Patient with PMH significant for CHF, CKD IV, BPH, HTN,  AAA repair 2011, and recurrent UTIs. Presents this admission with left psoas muscle abscess due to severe PNA and acute CHF exacerbation.    Pt unwilling to speak with RD. RN reports pt eats when he is in a good mood but intake overall is off/on. Family not in room at the time of RD visit. Meal completions charted as 10-100% for pt's last eight meals (4 meals at 75-100% and 4 meals <25%). RN reports pt does not like Ensure but will drink it off/on. RD to order magic cups.   Pt unsure of UBW. Records show multiple stated weights at 166 lb over the last year. Would recommend checking daily weights as pt requires daily lasix. Nutrition-Focused physical exam completed.   Medications reviewed and include: calcium-vitd, ferrous sulfate, 40 mg lasix once daily, sodium bicarb, Vit D Labs reviewed.   NUTRITION - FOCUSED PHYSICAL EXAM:    Most Recent Value  Orbital Region  Moderate depletion  Upper Arm Region  Severe depletion  Thoracic and Lumbar Region  Unable to assess  Buccal Region  Severe depletion  Temple Region  Severe depletion  Clavicle Bone Region  Severe depletion  Clavicle and Acromion Bone Region  Severe depletion  Scapular Bone Region  Unable to  assess  Dorsal Hand  Severe depletion  Patellar Region  Severe depletion  Anterior Thigh Region  Severe depletion  Posterior Calf Region  Severe depletion  Edema (RD Assessment)  Mild     Diet Order:   Diet Order            Diet regular Room service appropriate? Yes; Fluid consistency: Thin  Diet effective now              EDUCATION NEEDS:   Not appropriate for education at this time  Skin:  Skin Assessment: Reviewed RN Assessment  Last BM:  1/15  Height:   Ht Readings from Last 1 Encounters:  11/26/18 5' 8.5" (1.74 m)    Weight:   Wt Readings from Last 1 Encounters:  11/26/18 75.2 kg    Ideal Body Weight:  72.7 kg  BMI:  Body mass index is 24.84 kg/m.  Estimated Nutritional Needs:   Kcal:  2250-2450 kcal  Protein:  115-130 grams  Fluid:  >/= 2.2 L/day    Mariana Single RD, LDN Clinical Nutrition Pager # - [phone]

## 2018-12-07 NOTE — Progress Notes (Signed)
LCSW following for SNF placement.  Patient has bed and auth for [facility].  Patietn not medically stable today. Patient will need new auth when ready. Cannot obtain auth over the weekend.   LCSW will continue to follow.   [name]

## 2018-12-07 NOTE — Progress Notes (Addendum)
PROGRESS NOTE    [name].  FIE:[id number] DOB: 1931-04-22 DOA: 11/26/2018 PCP: Mackie Pai, PA-C    Brief Narrative; 85 year old with past medical history significant for blindness, chronic kidney disease a stage IV, A. fib, hypothyroidism, BPH, abdominal aortic aneurysm status post repair who presented with left flank pain and leg pain.  Patient was admitted with a working diagnosis of retroperitoneal mass rule out abscess..  Patient noted to be in A. fib RVR today systolic blood pressure in the 100 range.  Cardiology consulted.  Assessment & Plan:   Principal Problem:   Dehydration Active Problems:   Hypothyroid   CKD (chronic kidney disease), stage III (HCC)   Hypocalcemia   Urinary tract infection associated with indwelling urethral catheter (HCC)   Back pain   Retroperitoneal mass   Anemia  Addendum; Hypotension;  Patient SBP in the high 80. He is asymptomatic.  Discussed with Dr Melvyn Novas with CCM. He agrees with albumin. He recommend transfer to CCU and CCM will see patient in consultation.  -Also regarding pericardial effusion; CVTS. -discussed with son Lennette Bihari, he agree with transfer to [facility].    Left psoas muscle abscess due to severe pneumonia; Present on admission.. Patient has been on IV cefepime. Patient underwent left psoas abscess drain placed by IR on January 8, 202020. Drain site, leaking.  IR consulted for reevaluation.. Recommend a recent repeat a CT scan.  This can show decrease in size of left psoas abscess. I discussed case with Dr. Graylon Good, patient will likely need 1 more week of IV antibiotics. He will need close follow-up with IR.  Hypothyroidism; Continue with Synthroid  A. fib with RVR; His metoprolol was held because of   low systolic blood pressure. Cardiology consulted. Appreciate Dr Doylene Canard help.  Patient received a dose of digoxin.  Start amiodarone.  Continue with low dose metoprolol.   Pericardial effusion;  Fibrinous  appearance. Could be infected. Discussed with DR Doylene Canard, treat with IV antibiotics.  Oral lasix as tolerated.  Will consult ID>   Hypotension;  This has could be related to morphine. Lactic acid  was normal. BP stable.   Anemia; iron deficiency;  Start oral iron,.  Repeat labs in am.   Acute systolic Heart failure exacerbation.  ECHO low EF, 45 %, pleural effusion.  Start low dose lasix.   Chronic kidney disease stage IV; Renal function has been stable. Monitor renal function n oral lasix.   Bilateral pleural effusion; with Adjacent lobe collapse consolidation Hold IV fluids. Incentive spirometry We will try IV Lasix when blood pressure stable Chest x ray with bilateral pleural effusion. Suspect pulmonary edema no PNA.  Patient is not hypoxic. Will start lasix low dose, monitor BP.   BPH/ sp TURP 2012, hx of bulbar urethral stricturewith chronic indwelling foley catheter Intermittent discomfort with Foley catheter that improved with flushing.Marland Kitchen  Hypertension; blood pressure has been controlled off of medication.  UTI due to enterococcus, related to chronic indwelling Foley catheter He received treatment with ampicillin.  Severe malnutrition; will consult nutritionist.  Start ensure.                Estimated body mass index is 24.84 kg/m as calculated from the following:   Height as of this encounter: 5' 8.5" (1.74 m).   Weight as of this encounter: 75.2 kg.   DVT prophylaxis: Heparin Code Status: Full code Family Communication: No family at bedside Disposition Plan: Continued for IV antibiotics, stabilization of blood pressure, need PT, needed  SNF placement.  Consultants:   IR   Procedures:    Antimicrobials:   Cefepime   Subjective: He is frustrated for been in the hospital. Explain to him infection on huis back is better, his HR is better controlled today. He has fluid in his lungs, I will give him lasix.  Denies dyspnea.   I came back  and inform patient results f ECHO and plan to get ID consult. He was appreciative of information.   Objective: Vitals:   12/06/18 1442 12/06/18 1524 12/06/18 2055 12/07/18 0415  BP:   134/67 111/61  Pulse: (!) 115 (!) 108 100 (!) 102  Resp:   19 20  Temp:   98.1 F (36.7 C) 98.5 F (36.9 C)  TempSrc:      SpO2:   100% 100%  Weight:      Height:        Intake/Output Summary (Last 24 hours) at 12/07/2018 1135 Last data filed at 12/07/2018 0420 Gross per 24 hour  Intake 252.03 ml  Output 3000 ml  Net -2747.97 ml   Filed Weights   11/26/18 0208  Weight: 75.2 kg    Examination:  General exam: NAD Respiratory system: Crackles bases Cardiovascular system: S 1, S 2 IRR Gastrointestinal system: BS present,  Central nervous system: Alert and oriented.  Back with drain in place, Extremities: Symmetric power   Data Reviewed: I have personally reviewed following labs and imaging studies  CBC: Recent Labs  Lab 12/01/18 0700 12/02/18 0317 12/05/18 1000 12/06/18 0425  WBC 8.8 8.6 12.5* 12.2*  NEUTROABS  --  6.6  --   --   HGB 8.2* 8.4* 8.3* 8.7*  HCT 26.2* 26.4* 26.7* 28.3*  MCV 101.6* 100.0 102.7* 102.9*  PLT 142* 170 149* 235   Basic Metabolic Panel: Recent Labs  Lab 12/03/18 0318 12/04/18 0413 12/05/18 0345 12/06/18 0425 12/07/18 0327  NA 140 141 139 137 137  K 4.2 4.1 4.2 4.1 4.2  CL 113* 113* 109 107 107  CO2 18* 20* 20* 20* 21*  GLUCOSE 168* 118* 98 122* 104*  BUN 36* 34* 38* 39* 42*  CREATININE 2.25* 2.17* 2.16* 2.17* 2.32*  CALCIUM 7.3* 7.3* 7.9* 8.0* 8.1*   GFR: Estimated Creatinine Clearance: 22.9 mL/min (A) (by C-G formula based on SCr of 2.32 mg/dL (H)). Liver Function Tests: Recent Labs  Lab 12/07/18 0327  AST 10*  ALT 7  ALKPHOS 63  BILITOT 0.6  PROT 4.9*  ALBUMIN 2.0*   No results for input(s): LIPASE, AMYLASE in the last 168 hours. No results for input(s): AMMONIA in the last 168 hours. Coagulation Profile: No results for input(s):  INR, PROTIME in the last 168 hours. Cardiac Enzymes: No results for input(s): CKTOTAL, CKMB, CKMBINDEX, TROPONINI in the last 168 hours. BNP (last 3 results) Recent Labs    08/22/18 1224  PROBNP 371.0*   HbA1C: No results for input(s): HGBA1C in the last 72 hours. CBG: No results for input(s): GLUCAP in the last 168 hours. Lipid Profile: No results for input(s): CHOL, HDL, LDLCALC, TRIG, CHOLHDL, LDLDIRECT in the last 72 hours. Thyroid Function Tests: Recent Labs    12/06/18 1458  TSH 8.780*  FREET4 0.96   Anemia Panel: Recent Labs    12/06/18 1458  VITAMINB12 663  FOLATE 17.5  FERRITIN 579*  TIBC 103*  IRON 23*  RETICCTPCT 3.8*   Sepsis Labs: Recent Labs  Lab 12/05/18 1609  LATICACIDVEN 1.3    Recent Results (from the past 240 hour(s))  Aerobic/Anaerobic Culture (surgical/deep wound)     Status: None   Collection Time: 11/28/18  9:54 AM  Result Value Ref Range Status   Specimen Description   Final    ABSCESS LEFT PSOAS Performed at [facility] [address] [place], [place] [address]    Special Requests   Final    Normal Performed at [facility], [place] [address], [place], [place] [address]    Gram Stain   Final    ABUNDANT WBC PRESENT, PREDOMINANTLY PMN RARE GRAM NEGATIVE RODS    Culture   Final    MODERATE PSEUDOMONAS AERUGINOSA NO ANAEROBES ISOLATED Performed at [facility], [place] [address] [place], [place] [address]    Report Status 12/03/2018 FINAL  Final   Organism ID, Bacteria PSEUDOMONAS AERUGINOSA  Final      Susceptibility   Pseudomonas aeruginosa - MIC*    CEFTAZIDIME 4 SENSITIVE Sensitive     CIPROFLOXACIN >=4 RESISTANT Resistant     GENTAMICIN >=16 RESISTANT Resistant     IMIPENEM >=16 RESISTANT Resistant     CEFEPIME 8 SENSITIVE Sensitive     * MODERATE PSEUDOMONAS AERUGINOSA         Radiology Studies: Ct Abdomen Pelvis Wo Contrast  Result Date: 12/05/2018 CLINICAL DATA:   Psoas abscess. EXAM: CT ABDOMEN AND PELVIS WITHOUT CONTRAST TECHNIQUE: Multidetector CT imaging of the abdomen and pelvis was performed following the standard protocol without IV contrast. COMPARISON:  11/26/2018 FINDINGS: Lower chest: Moderate bilateral pleural effusions with dependent collapse/consolidation in the lower lobes bilaterally. Hepatobiliary: Left hepatic cyst. Gallbladder nondistended. No intrahepatic or extrahepatic biliary dilation. Pancreas: Diffusely atrophic. Similar appearance of the fluid density structure in the region of the pancreatic head/proximal duodenum. This has been present over multiple prior exams dating back to 09/26/2014 and described as stable back to 2008 on prior studies. This lesion is variably contained gas bubbles on today's study there is a suggest and may communicate with the gastric antrum or proximal duodenum. While chronic pseudocyst remains a consideration as described on prior studies, distal gastric or duodenal diverticulum might also be considered. Spleen: No splenomegaly. No focal mass lesion. Adrenals/Urinary Tract: No adrenal nodule or mass. Bilateral renal lesions of varying size and attenuation are identified, most of which are suggestive of cysts. No evidence for hydroureter. Bladder is distended with Foley catheter visualized. Stomach/Bowel: Stomach is distended with food. Duodenum is nondilated. Small bowel loops are minimally distended and fluid-filled. The appendix is normal. Moderate stool volume evident in the colon. Vascular/Lymphatic: Patient is status post abdominal aortic graft surgery. Haziness around the abdominal aorta is similar to prior. There is no gastrohepatic or hepatoduodenal ligament lymphadenopathy. No intraperitoneal or retroperitoneal lymphadenopathy. No pelvic sidewall lymphadenopathy. Reproductive: Not well seen due to streak artifact from left hip replacement. Other: Diffuse body wall edema has progressed in the interval.  Musculoskeletal: Left psoas abscess has decreased in the interval measuring 2.1 x 4.2 cm today compared to 6.2 x 7.7 cm previously. The formed loop of the percutaneous drain appears to be in the posterior aspect of the abscess cavity. No worrisome lytic or sclerotic osseous abnormality. IMPRESSION: 1. Interval decrease in size of the left psoas abscess. 2. Interval progression of body wall edema. 3. Interval progression of bilateral pleural effusions now moderate bilaterally with adjacent lower lobe collapse/consolidation. 4. Otherwise stable exam. Electronically Signed   By: Misty Stanley M.D.   On: 12/05/2018 14:20   Dg Chest 2 View  Result Date:  12/07/2018 CLINICAL DATA:  Pleural effusion EXAM: CHEST - 2 VIEW COMPARISON:  12/01/2018 FINDINGS: Moderate to large layering bilateral effusions. Cardiomegaly with vascular congestion and interstitial prominence, likely interstitial edema. No real change since prior study. Right PICC line is unchanged. IMPRESSION: Cardiomegaly with vascular congestion and pulmonary edema. Large layering bilateral effusions. Electronically Signed   By: Rolm Baptise M.D.   On: 12/07/2018 10:07        Scheduled Meds: . amiodarone  400 mg Oral Daily  . calcium-vitamin D  2 tablet Oral BID  . furosemide  40 mg Intravenous BID  . heparin injection (subcutaneous)  5,000 Units Subcutaneous Q8H  . levothyroxine  88 mcg Oral QAC breakfast  . metoprolol tartrate  25 mg Oral BID  . oxyCODONE-acetaminophen  1 tablet Oral TID  . sodium bicarbonate  650 mg Oral BID  . sodium chloride flush  5 mL Intracatheter Q8H  . tamsulosin  0.4 mg Oral QHS  . Vitamin D (Ergocalciferol)  50,000 Units Oral Q7 days   Continuous Infusions: . sodium chloride    . ceFEPime (MAXIPIME) IV 2 g (12/06/18 1711)     LOS: 10 days    Time spent: 35 minutes    Belkys A Regalado, MD [facility]   If 7PM-7AM, please contact night-coverage [email or web address] Password [facility] 12/07/2018, 11:35  AM

## 2018-12-07 NOTE — Progress Notes (Signed)
Called report to [facility]. [facility] set up.

## 2018-12-07 NOTE — Progress Notes (Signed)
BP was 86/49  Paged Attending MD. Rockey Situ to hold PO lasix and page Cardiology. Dr. Doylene Canard informed of BP and new orders for Albumin ordered. Will continue to monitor

## 2018-12-07 NOTE — Progress Notes (Signed)
Pt  Has left the unit via [facility] enroute to [employer] [facility]

## 2018-12-07 NOTE — Progress Notes (Signed)
Pharmacy Brief Antibiotic Note  85 y/o M with psoas abscess on cefepime also found to have pericardial effusion. ID switching to ceftazidime. Will initiate ceftazidime 2 g iv q 24 hours. Will follow renal function, clinical course, and culture results if available.   Ulice Dash, PharmD Clinical Pharmacist Pager # [phone]  12/07/18 6:22 PM

## 2018-12-07 NOTE — Consult Note (Addendum)
[facility]  Total days of antibiotics 13        Day 8 cefepime         Reason for Consult: pericardial effusion   Referring Physician: regalado  Principal Problem:   Dehydration Active Problems:   Hypothyroid   CKD (chronic kidney disease), stage III (HCC)   Hypocalcemia   Urinary tract infection associated with indwelling urethral catheter (HCC)   Back pain   Retroperitoneal mass   Anemia    HPI: Quashawn Jewkes. is a 85 y.o. male  with hx blindness, CKD IV baseline Cr 2.2-2.5, Afib not on AC, hx AAA repair 2011, hypothyoridism, BPH requiring I/O cath, and recent recurrent UTI who was admitted on 1/6 for left leg pain and numbness x 2 days. He had recently been in the ER around new year for  Urinary retention of 739mL, releaved by foley insertion, aki , leukocytosis of 15K with urine cx growing e.faecalis. he underwent abd CT during this visit where it was noted that he had new left psoas mass -There is a mixed attenuation 6.7 x 4.6 cm medial upper left retroperitoneal mass but at this time it was not thought to be described as abscess, he was not admitted and referred to follow up with urology for further work up.   He returned to the ED on 1/6 for worsening lower abdominal discomfort, and pain radiating down legs. At that time, he reported that his foley was working well and did not have retention. Repeat abdominal CT showed  6.2 x 7.7 cm in size. He underwent aspiration on 1/8 which grew out pseudomonas. His abtx changed from piptazo to cefepime due to R pattern. His wbc improved from 15K down to 8 but in the last few days started to increase back to 12K. On repeat imaging, the psoas abscess has decreased in size to 2.1 x 4.2 cm as noted on 1/15. He started to develop afib with RVR and underwent TTE which now has found a moderate pericardial effusion with fibrinous debris.   In looking back on his cxr from early October, he has developed flask-shaped heart  silhoutte concerning for pericardial effusion.    ID asked to weigh in on management of pericardial effusion concerning for infection.  Patient reports being frustrated, "they keep findings problems with me. My thinking is not so straight. "  RN reports that patient has low blood pressure with sBP of mid 80s Past Medical History:  Diagnosis Date  . AAA (abdominal aortic aneurysm) ([place])   . Arthritis   . BPH (benign prostatic hyperplasia)   . Complication of anesthesia    "a tough time"  -pt unable to explain  . Glucose intolerance (impaired glucose tolerance)   . Hypertension   . Hypothyroid   . Pancreatitis 15-20 yrs ago  . Peripheral neuropathy    fingers and toes  . Renal insufficiency     Allergies:  Allergies  Allergen Reactions  . Aspirin Other (See Comments)    Low bp, stomach pains  . Shellfish Allergy     MEDICATIONS: . amiodarone  400 mg Oral Daily  . calcium-vitamin D  2 tablet Oral BID  . colchicine  0.6 mg Oral Daily  . feeding supplement (ENSURE ENLIVE)  237 mL Oral BID BM  . ferrous sulfate  325 mg Oral BID WC  . furosemide  40 mg Oral Daily  . heparin injection (subcutaneous)  5,000 Units Subcutaneous Q8H  .  levothyroxine  88 mcg Oral QAC breakfast  . metoprolol tartrate  25 mg Oral BID  . multivitamin with minerals  1 tablet Oral Daily  . oxyCODONE-acetaminophen  1 tablet Oral TID  . sodium bicarbonate  650 mg Oral BID  . sodium chloride flush  5 mL Intracatheter Q8H  . tamsulosin  0.4 mg Oral QHS  . Vitamin D (Ergocalciferol)  50,000 Units Oral Q7 days    Social History   Tobacco Use  . Smoking status: Former Smoker    Packs/day: 0.30    Years: 60.00    Pack years: 18.00    Last attempt to quit: 10/02/2012    Years since quitting: 6.1  . Smokeless tobacco: Never Used  Substance Use Topics  . Alcohol use: Yes    Alcohol/week: 0.0 standard drinks    Comment: occasioally  . Drug use: No    Family History  Problem Relation Age of Onset    . Heart attack Mother   . Heart disease Mother   . Heart attack Father   . Diabetes Son        amputation-BKA     Review of Systems  Constitutional: + chills. Negative for fever,diaphoresis, activity change, appetite change, fatigue and unexpected weight change.  HENT: Negative for congestion, sore throat, rhinorrhea, sneezing, trouble swallowing and sinus pressure.  Eyes: + blindness Respiratory: Negative for cough, chest tightness, shortness of breath, wheezing and stridor.  Cardiovascular: Negative for chest pain, palpitations and leg swelling.  Gastrointestinal: Negative for nausea, vomiting, abdominal pain, diarrhea, constipation, blood in stool, abdominal distention and anal bleeding.  Genitourinary: Negative for dysuria, hematuria, flank pain and difficulty urinating.  Musculoskeletal: + back pain. Negative for myalgias,  joint swelling, arthralgias and gait problem.  Skin: Negative for color change, pallor, rash and wound.  Neurological: Negative for dizziness, tremors, weakness and light-headedness.  Hematological: Negative for adenopathy. Does not bruise/bleed easily.  Psychiatric/Behavioral: Negative for behavioral problems, confusion, sleep disturbance, dysphoric mood, decreased concentration and agitation.      OBJECTIVE: Temp:  [97.5 F (36.4 C)-98.5 F (36.9 C)] 97.5 F (36.4 C) (01/17 1414) Pulse Rate:  [78-102] 78 (01/17 1414) Resp:  [19-20] 20 (01/17 1414) BP: (96-134)/(56-67) 96/56 (01/17 1414) SpO2:  [98 %-100 %] 100 % (01/17 1414) Physical Exam  Constitutional: He is oriented to person, place, and time. He appears well-developed and well-nourished. No distress.  HENT:  Mouth/Throat: Oropharynx is clear and moist. No oropharyngeal exudate.  Cardiovascular: irreg, irreg, with distant heart sounds. Exam reveals no gallop and no friction rub. Unable to auscultate murmurs Pulmonary/Chest: Effort normal and breath sounds normal. No respiratory distress. He has  no wheezes.  Abdominal: Soft. Bowel sounds are normal. He exhibits no distension. There is no tenderness.  Lymphadenopathy:  He has no cervical adenopathy.  Back: drain in place with dark tan murky fluid in jp bulb Neurological: He is alert and oriented to person, place, and time.  Skin: frail, lipoma on arms. Left forearm lipoma, pulsatile Psychiatric: He has a normal mood and affect. His behavior is normal.     LABS: Results for orders placed or performed during the hospital encounter of 11/26/18 (from the past 48 hour(s))  CBC     Status: Abnormal   Collection Time: 12/06/18  4:25 AM  Result Value Ref Range   WBC 12.2 (H) 4.0 - 10.5 K/uL   RBC 2.75 (L) 4.22 - 5.81 MIL/uL   Hemoglobin 8.7 (L) 13.0 - 17.0 g/dL   HCT 28.3 (  L) 39.0 - 52.0 %   MCV 102.9 (H) 80.0 - 100.0 fL   MCH 31.6 26.0 - 34.0 pg   MCHC 30.7 30.0 - 36.0 g/dL   RDW 18.9 (H) 11.5 - 15.5 %   Platelets 161 150 - 400 K/uL   nRBC 0.0 0.0 - 0.2 %    Comment: Performed at [facility], [place] [address] [place], [place] [address]  Basic metabolic panel     Status: Abnormal   Collection Time: 12/06/18  4:25 AM  Result Value Ref Range   Sodium 137 135 - 145 mmol/L   Potassium 4.1 3.5 - 5.1 mmol/L   Chloride 107 98 - 111 mmol/L   CO2 20 (L) 22 - 32 mmol/L   Glucose, Bld 122 (H) 70 - 99 mg/dL   BUN 39 (H) 8 - 23 mg/dL   Creatinine, Ser 2.17 (H) 0.61 - 1.24 mg/dL   Calcium 8.0 (L) 8.9 - 10.3 mg/dL   GFR calc non Af Amer 27 (L) >60 mL/min   GFR calc Af Amer 31 (L) >60 mL/min   Anion gap 10 5 - 15    Comment: Performed at [facility], [place] [address], [place], [place] [address]  Vitamin B12     Status: None   Collection Time: 12/06/18  2:58 PM  Result Value Ref Range   Vitamin B-12 663 180 - 914 pg/mL    Comment: (NOTE) This assay is not validated for testing neonatal or myeloproliferative syndrome specimens for Vitamin B12 levels. Performed at [facility], [place] [address] [place], [place] [address]   Folate     Status: None   Collection Time: 12/06/18  2:58 PM  Result Value Ref Range   Folate 17.5 >5.9 ng/mL    Comment: RESULTS CONFIRMED BY MANUAL DILUTION Performed at [place] [address] [place], [place] [address]   Iron and TIBC     Status: Abnormal   Collection Time: 12/06/18  2:58 PM  Result Value Ref Range   Iron 23 (L) 45 - 182 ug/dL   TIBC 103 (L) 250 - 450 ug/dL   Saturation Ratios 22 17.9 - 39.5 %   UIBC 80 ug/dL    Comment: Performed at [facility], [place] [address] [place], [place] [address]  Ferritin     Status: Abnormal   Collection Time: 12/06/18  2:58 PM  Result Value Ref Range   Ferritin 579 (H) 24 - 336 ng/mL    Comment: Performed at [facility], [place] [address], [place], [place] [address]  Reticulocytes     Status: Abnormal   Collection Time: 12/06/18  2:58 PM  Result Value Ref Range   Retic Ct Pct 3.8 (H) 0.4 - 3.1 %   RBC. 2.68 (L) 4.22 - 5.81 MIL/uL   Retic Count, Absolute 101.0 19.0 - 186.0 K/uL   Immature Retic Fract 18.1 (H) 2.3 - 15.9 %    Comment: Performed at [facility], [place] [address] [place], [place] [address]  TSH     Status: Abnormal   Collection Time: 12/06/18  2:58 PM  Result Value Ref Range   TSH 8.780 (H) 0.350 - 4.500 uIU/mL    Comment: Performed by a 3rd Generation assay with a functional sensitivity of <=0.01 uIU/mL. Performed at [facility], [place] [address], [place], [place] [address]   T4, free     Status: None   Collection  Time: 12/06/18  2:58 PM  Result Value Ref Range   Free T4 0.96 0.82 - 1.77 ng/dL    Comment: (NOTE) Biotin ingestion may interfere with free T4 tests. If the results are inconsistent with the TSH level, previous test results, or the clinical presentation, then consider biotin interference. If needed, order repeat testing after stopping biotin. Performed at  [facility], [place] [address], [place], [place] [address]   Comprehensive metabolic panel     Status: Abnormal   Collection Time: 12/07/18  3:27 AM  Result Value Ref Range   Sodium 137 135 - 145 mmol/L   Potassium 4.2 3.5 - 5.1 mmol/L   Chloride 107 98 - 111 mmol/L   CO2 21 (L) 22 - 32 mmol/L   Glucose, Bld 104 (H) 70 - 99 mg/dL   BUN 42 (H) 8 - 23 mg/dL   Creatinine, Ser 2.32 (H) 0.61 - 1.24 mg/dL   Calcium 8.1 (L) 8.9 - 10.3 mg/dL   Total Protein 4.9 (L) 6.5 - 8.1 g/dL   Albumin 2.0 (L) 3.5 - 5.0 g/dL   AST 10 (L) 15 - 41 U/L   ALT 7 0 - 44 U/L   Alkaline Phosphatase 63 38 - 126 U/L   Total Bilirubin 0.6 0.3 - 1.2 mg/dL   GFR calc non Af Amer 25 (L) >60 mL/min   GFR calc Af Amer 29 (L) >60 mL/min   Anion gap 9 5 - 15    Comment: Performed at [facility], [place] [address] [place], [place] [address]    MICRO: 1/6 blood cx ngtd 1/8 aspiration PsA  Pseudomonas aeruginosa    MIC    CEFEPIME 8 SENSITIVE  Sensitive    CEFTAZIDIME 4 SENSITIVE  Sensitive    CIPROFLOXACIN >=4 RESISTANT  Resistant    GENTAMICIN >=16 RESIST... Resistant    IMIPENEM >=16 RESIST... Resistant         Susceptibility Comments    IMAGING: Dg Chest 2 View  Result Date: 12/07/2018 CLINICAL DATA:  Pleural effusion EXAM: CHEST - 2 VIEW COMPARISON:  12/01/2018 FINDINGS: Moderate to large layering bilateral effusions. Cardiomegaly with vascular congestion and interstitial prominence, likely interstitial edema. No real change since prior study. Right PICC line is unchanged. IMPRESSION: Cardiomegaly with vascular congestion and pulmonary edema. Large layering bilateral effusions. Electronically Signed   By: Rolm Baptise M.D.   On: 12/07/2018 10:07    TTE: Study Conclusions  - Left ventricle: The cavity size was normal. There was mild   concentric hypertrophy. Systolic function was mildly to   moderately reduced. The estimated ejection fraction was in the   range of 40% to 45%.  There is mild hypokinesis of the   basal-midinferior myocardium. The study is not technically   sufficient to allow evaluation of LV diastolic function. - Mitral valve: Calcified annulus. There was mild regurgitation. - Left atrium: The atrium was severely dilated. - Right atrium: The atrium was moderately dilated. - Pericardium, extracardiac: A moderate, free-flowing pericardial   effusion was identified circumferential to the heart. The fluid   exhibited a fibrinous appearance.There was no evidence of   hemodynamic compromise.  Impressions:  - Pericardial effusion. Findings exclude clinically significant   tamponade.  Assessment/Plan:  85yo M with PsA psoas muscle abscess also found to have concurrent pericardial effusion, of unknown etiology, concerning for infection vs. Malignancy vs. transudative process. -with moderate effusion concern for tamponade physiology  - recommend to have patient transferred to icu for  evaluation of pericardial effusion and monitoring esp concern for tamponade.  please ask CT surgery to evaluate patient/imaging to see if amenable to IR placing drain vs. If too high risk for window procedure. At minimum, may need pericardiocentesis for fluid analysis, aerobic, anaerobic, and fungal culture as well as cytology - for the time being, changing abtx to ceftaz to see if any difference in response to therapy, though I suspect that if pericardial effusion is infectious, then source control is needed for management  -given that these are invasive procedures for further dx and management, would discuss with son, and patient if this is the route they choose to do given his age and co-morbidities. I am not convinced he has insight to the severity of his current health. Would need to discuss with his son to get consent for next work up.  Continue on ceftaz for 6 wk if this is purulent pericarditis, also would discuss that this has high mortality if not  evacuated.  Leukocytosis = continue to monitor, currently switching abtx  Psoas muscle abscess = has improved in size though now it is 4 x 2 cm. Continue with bid flushing.  Dr Tommy Medal to see over the weekend  [place]. [facility] [phone]

## 2018-12-07 NOTE — Consult Note (Addendum)
Ref: Christian Pai, PA-C   Subjective:  Awake. Asked "why he was here, what is going on". Monitor shows atrial fibrillation with controlled ventricular response. Heart rate in 70's to 90 bpm.  Echocardiogram showing mild LV systolic dysfunction and has moderate pericardial effusion with fibribous appearance without tamponade.  Objective:  Vital Signs in the last 24 hours: Temp:  [98.1 F (36.7 C)-98.5 F (36.9 C)] 98.5 F (36.9 C) (01/17 0415) Pulse Rate:  [100-115] 102 (01/17 0415) Cardiac Rhythm: Atrial fibrillation (01/17 0900) Resp:  [18-20] 20 (01/17 0415) BP: (110-134)/(48-67) 111/61 (01/17 0415) SpO2:  [100 %] 100 % (01/17 0415)  Physical Exam: BP Readings from Last 1 Encounters:  12/07/18 111/61     Wt Readings from Last 1 Encounters:  11/26/18 75.2 kg    Weight change:  Body mass index is 24.84 kg/m. HEENT: [place]/AT, Eyes-Blue, PERL, EOMI, Conjunctiva-Pale, Sclera-Non-icteric Neck: No JVD, No bruit, Trachea midline. Lungs:  Clearing, Bilateral. Cardiac:  Irregular rhythm, normal S1 and S2, no S3. II/VI systolic murmur. Abdomen:  Soft, non-tender. BS present. Extremities:  Trace edema present. No cyanosis. No clubbing. CNS: AxOx2, Cranial nerves grossly intact, moves all 4 extremities.  Skin: Warm and dry.   Intake/Output from previous day: 01/16 0701 - 01/17 0700 In: 492 [P.O.:480; IV Piggyback:12] Out: 3000 [Urine:2975; Drains:20; Stool:5]    Lab Results: BMET    Component Value Date/Time   NA 137 12/07/2018 0327   NA 137 12/06/2018 0425   NA 139 12/05/2018 0345   K 4.2 12/07/2018 0327   K 4.1 12/06/2018 0425   K 4.2 12/05/2018 0345   CL 107 12/07/2018 0327   CL 107 12/06/2018 0425   CL 109 12/05/2018 0345   CO2 21 (L) 12/07/2018 0327   CO2 20 (L) 12/06/2018 0425   CO2 20 (L) 12/05/2018 0345   GLUCOSE 104 (H) 12/07/2018 0327   GLUCOSE 122 (H) 12/06/2018 0425   GLUCOSE 98 12/05/2018 0345   BUN 42 (H) 12/07/2018 0327   BUN 39 (H) 12/06/2018 0425    BUN 38 (H) 12/05/2018 0345   CREATININE 2.32 (H) 12/07/2018 0327   CREATININE 2.17 (H) 12/06/2018 0425   CREATININE 2.16 (H) 12/05/2018 0345   CREATININE 2.03 (H) 01/01/2015 1052   CREATININE 1.55 (H) 11/06/2014 1055   CREATININE 1.89 (H) 09/25/2014 1159   CALCIUM 8.1 (L) 12/07/2018 0327   CALCIUM 8.0 (L) 12/06/2018 0425   CALCIUM 7.9 (L) 12/05/2018 0345   CALCIUM 5.1 11/27/2018 0558   CALCIUM 6.1 (LL) 12/06/2012 1430   GFRNONAA 25 (L) 12/07/2018 0327   GFRNONAA 27 (L) 12/06/2018 0425   GFRNONAA 27 (L) 12/05/2018 0345   GFRAA 29 (L) 12/07/2018 0327   GFRAA 31 (L) 12/06/2018 0425   GFRAA 31 (L) 12/05/2018 0345   CBC    Component Value Date/Time   WBC 12.2 (H) 12/06/2018 0425   RBC 2.68 (L) 12/06/2018 1458   RBC 2.75 (L) 12/06/2018 0425   HGB 8.7 (L) 12/06/2018 0425   HCT 28.3 (L) 12/06/2018 0425   PLT 161 12/06/2018 0425   MCV 102.9 (H) 12/06/2018 0425   MCH 31.6 12/06/2018 0425   MCHC 30.7 12/06/2018 0425   RDW 18.9 (H) 12/06/2018 0425   LYMPHSABS 0.9 12/02/2018 0317   MONOABS 0.9 12/02/2018 0317   EOSABS 0.1 12/02/2018 0317   BASOSABS 0.0 12/02/2018 0317   HEPATIC Function Panel Recent Labs    11/26/18 0216 11/29/18 0612 12/07/18 0327  PROT 6.1* 4.9* 4.9*   HEMOGLOBIN A1C No  components found for: HGA1C,  MPG CARDIAC ENZYMES Lab Results  Component Value Date   CKTOTAL 246 11/26/2018   CKMB 2.3 06/08/2010   TROPONINI <0.30 09/27/2014   TROPONINI <0.30 09/27/2014   TROPONINI <0.30 09/26/2014   BNP Recent Labs    08/22/18 1224  PROBNP 371.0*   TSH Recent Labs    12/06/18 1458  TSH 8.780*   CHOLESTEROL No results for input(s): CHOL in the last 8760 hours.  Scheduled Meds: . amiodarone  400 mg Oral Daily  . calcium-vitamin D  2 tablet Oral BID  . furosemide  40 mg Intravenous BID  . heparin injection (subcutaneous)  5,000 Units Subcutaneous Q8H  . levothyroxine  88 mcg Oral QAC breakfast  . metoprolol tartrate  25 mg Oral BID  .  oxyCODONE-acetaminophen  1 tablet Oral TID  . sodium bicarbonate  650 mg Oral BID  . sodium chloride flush  5 mL Intracatheter Q8H  . tamsulosin  0.4 mg Oral QHS  . Vitamin D (Ergocalciferol)  50,000 Units Oral Q7 days   Continuous Infusions: . sodium chloride    . ceFEPime (MAXIPIME) IV 2 g (12/06/18 1711)   PRN Meds:.sodium chloride, acetaminophen **OR** acetaminophen, diphenhydrAMINE-zinc acetate, morphine injection, ondansetron **OR** ondansetron (ZOFRAN) IV, sodium chloride flush  Assessment/Plan: Atrial fibrillation with controlled ventricular response Hypotension Sepsis with pseudomonas infection Left Psoas abscess with drainage Hypothyroidism Chronic iron deficiency anemia CKD, IV Bilateral pleural effusion UTI with indwelling catheter PVD  Increase Metoprolol dose and amiodarone dose.  Decrease amiodarone to 200 mg. After 7 days.  Use lasix cautiously. DC lanoxin. Anticoagulation when patient is stable with near normal Hgb.   LOS: 10 days    Dixie Dials  MD  12/07/2018, 12:39 PM

## 2018-12-07 NOTE — Consult Note (Addendum)
NAME:  Christian Melchior., MRN:  [id number], DOB:  Nov 18, 1931, LOS: 10 ADMISSION DATE:  11/26/2018, CONSULTATION DATE:  12/07/2017 REFERRING MD: Dr. Tyrell Antonio , CHIEF COMPLAINT:  Hypotension  Brief History   85 year old male admitted 1/6 after worsening leg pain and enterococcus UTI with  known retroperitoneal mass from 12/30, found to be psoas abscess s/p IR drain 1/8, culture c/w resistant pseudomonas.  Hospitalization complicated with Afib w/RVR.  TTE showing new moderate pleural effusion with fibrinous material (w/no tamponade physiology) concerning for infection.  New episodes of hypotension since 1/16 tx to [facility] for closer monitoring and TCTS evaluation.  History of present illness   85 year old male with PMH significant for but not limited to CKD stage IV (baseline sCr 2.2-2.5), Afib (not on home [facility]), hypothyroidism, BPH, urinary retention, recurrent UTI AAA s/p repair 2011, blindness 2/2 macular degeneration, and hard of hearing.  Was seen on 12/30 in ER for hypotension, abdominal pain, and urinary retention.  A foley catheter was placed.  Additionally noted to have mild AKI thought due to obstruction in which sCr after IVF, as well as a retroperitoneal mass.  Patient sent home with scheduled follow-up with urology and nephrology.  Urine culture from 12/30 showed enterococcus.  Patient's urologist recommended no treatment as patient asymptomatic.   He returned on 1/16 with ongoing malaise, no fever with ongoing abdominal and left leg pain with numbness now to the point he could no longer stand or walk.  He was admitted to [facility]. Started on ampicillin for UTI.  MRI on 1/6 showed 8.1x6.0x7.0 retropertioneal mass involving the left psoas muscle, favoring abscess. He was started on zosyn.  Underwent IR placement of drain to left psoas abscess on 1/8.  Culture grew out resistant pseudomonas. His antibiotic was changed to cefepime due to resistance.  His WBC continued to improve and repeat imaging on 1/15  showed decreased size of abscess.  However on 1/16, patient developed Afib with RVR with SBP noted around 100.  Cardiology consulted. He was started on amiodarone and metoprolol.  TTE on 1/16 show moderate pericardial effusion with fibrinous debris without tamponade physiology, and EF 40-45%.  ID was consulted to weigh in on abx management given concern for infection.  Since, patient has had intermittent hypotension but asymptomatic with some improvement after albumin.  It was decided that he would be best cared for and monitored in ICU at [facility] given need for TCTS evaluation of pericardial effusion where PCCM would take over primary care.    Past Medical History  Blindness secondary to macular degeneration, CKD stage IV (baseline sCr 2.2-2.5), Afib, hypothyroidism, BPH, urinary retention, recurrent UTI AAA s/p repair 2011, hard of hearing  Significant Hospital Events   1/6 Admitted 1/8 IR drain left psoas abscess 1/17 [facility]  Consults:  IR Cardiology ID  Procedures:  Foley- PTA  1/8 IR left psoas abscess drain >> 1/11 R PICC- single lumen  Significant Diagnostic Tests:  11/26/2018  MRI abd >> 1.  Motion degraded images. Evaluation is also limited due to lack of intravenous contrast administration. 2.  8.1 x 6.0 x 7.0 cm retroperitoneal mass abutting/involving the left psoas muscle, new from September 2019, favoring infectious/inflammatory etiology, possibly reflecting a psoas abscess. Aggressive neoplasm such as retroperitoneal sarcoma is considered less likely given the degree of rapid progression. 3.  Additional stable ancillary findings as above.  1/9 renal US >> 1. Increased renal echotexture bilaterally consistent with chronic medical renal disease. No  hydronephrosis. 2. Multiple bilateral benign-appearing renal cysts. 3. Foley catheter decompresses the bladder. 4. Left psoas lesion demonstrated, better characterized on previous CT and MRI examinations. See previous  reports.  11/29/2018 MRI lumbar/ thoracic >> 1. Motion degraded, incomplete examination. Only a single lumbar sequence was obtained. 2. Known left psoas abscess status post percutaneous drainage, incompletely imaged. 3. No gross epidural abscess or diskitis. 4. Chronic thoracic disc degeneration, similar to the 2016 MRI. 5. Incompletely evaluated lumbar spondylosis though without gross progression from 2016.  12/05/2018 CT abd/pelvis >> 1. Interval decrease in size of the left psoas abscess. 2. Interval progression of body wall edema. 3. Interval progression of bilateral pleural effusions now moderate bilaterally with adjacent lower lobe collapse/consolidation. 4. Otherwise stable exam  12/06/2018 TTE >> Pericardial effusion- moderate free flowing, fluid with fibrinous appearance. Findings exclude clinically significant tamponade.  EF 40-45%; not sufficient to evaluate diastolic function  Micro Data:  UC 1/6 >> enterococcus faecalis BC x 2 1/6 >> neg 1/8 left psoas abscess >> pseudomonas aeruginosa (sensitive to cefepime) 1/16 afib w/rvr   Antimicrobials:  Ceftriaxone 1/6 x 1 vanc 1/6 x 1 Ampicillin 1/6 >> 1/13 Cefepime 1/10 >> 1/17 Zosyn 1/9 >> 1/10 Ceftazidime 1/17 >>  Interim history/subjective:  Patient only complaints are mostly left leg pain, some right leg pain.  Denies CP/ SOB  Objective   Blood pressure (!) 96/54, pulse 81, temperature (!) 97.5 F (36.4 C), resp. rate 20, height 5' 8.5" (1.74 m), weight 75.2 kg, SpO2 100 %.        Intake/Output Summary (Last 24 hours) at 12/07/2018 1930 Last data filed at 12/07/2018 1700 Gross per 24 hour  Intake 180 ml  Output 1387 ml  Net -1207 ml   Filed Weights   11/26/18 0208  Weight: 75.2 kg   Examination: General:  Frail elderly male lying in bed in NAD, seems HOH although patient denies, easily agitated with questions and assessment HEENT: MM pink/moist, pupils 3/reactive, anicteric, no JVD Neuro: Alert, oriented to  person, place, states its January 1948,  CV: IRIR, distant heart sounds  PULM: even/non-labored, lungs bilaterally clear, diminished in bases, on room air IR:WERX, non-tender, bs active  Extremities: warm/dry, no LE edema, pt reports some chronic bilateral neuropathy below knees Skin: no rashes, on Back: there is drain in left upper flank area to bulb suction in place- cloudy tan drainage in tube  Resolved Hospital Problem list    Assessment & Plan:  Pericardial effusion w/ fibrinous material, free flowing, moderate - etiology unclear - ddx infection vs malignancy vs transudative process P:  Now in ICU for closer monitoring Currently normotensive, HR 60-70's afib, no JVD, and no current evidence of tamponade Will need TCTS evaluation in am to weigh in on IR drain vs window ID following, appreciate input- fluid will need to be sent for analysis, aerobic, anaerobic, fungal cultures and cytology, further recs to come depending on fluid Will place NPO after midnight and hold heparin SQ in case of procedure 1/18  Hypotension -  - w/SBP in the 80's 1/17, pt asymptomatic, improved after albumin, noted to be afib w/RVR.  Normal lactate - other etiologies ddx sepsis vs less likely tamponade P:  Currently normotensive, intact mental status  ICU monitoring  Monitor I/O, UOP Consider fluid bolus or additional albumin if needed (I/O's -6L) PICC to remain for now given uncertainty of abx duration   Psoas muscle abscess s/p IR drain 1/8.  Culture c/w resistant pseudomonas P:  Drain per  IR ID following, abc changed to ceftaz 1/17 percocet prn   Afib w/RVR New systolic dysfunction noted on TTE, LVEF 40-45%  P:  Tele monitoring, currently rate controlled Cards following recs for Amio 400 mg daily started 1/17 - with plans to decrease to 200 mg after 7 days- will leave at 200mg  daily given hypotension for now Holding metoprolol  No diuresis warranted at this time  Defer anticoagulation for  now Goal K >4, mag > 2  Leukocytosis - new since 1/15 - remains afebrile P:  Trend CBC/ fever curve  abx as above  CKD stage IV Chronic hypocalemia  P:  Trend UOP/ renal function Daily oscal w/ vit D  Hypothyroidism P:  Daily synthroid  IDA P:  Trend CBC daily iron  Bilateral pleural effusions - net -6L since admit P:  Monitor for now, asymptomatic, on room air Encourage good pulm hygiene Hold diuresis given hypotension today If becomes symptomatic, consider diagnostic/ therapeutic thoracentesis  BPH/ urinary retention w/ chronic indwelling foley UTI w/UC showing enterococcus s/p abx ampicillin P:  Continue foley Daily flomax   Severe malnutrition Deconditioning  P:  Nutritionist consulted Will need PT/ OT   Best practice:  Diet: thin Pain/Anxiety/Delirium protocol (if indicated): n/a VAP protocol (if indicated): n/a DVT prophylaxis: SCDs, heparin (however pt has been refusing) GI prophylaxis: n/a Glucose control: monitor on BMET Mobility: PT/ OT  Code Status: full  Family Communication: no family at bedside Disposition: ICU, if remains stable overnight could tx to progressive 1/18 and back to [facility]  Labs   CBC: Recent Labs  Lab 12/01/18 0700 12/02/18 0317 12/05/18 1000 12/06/18 0425  WBC 8.8 8.6 12.5* 12.2*  NEUTROABS  --  6.6  --   --   HGB 8.2* 8.4* 8.3* 8.7*  HCT 26.2* 26.4* 26.7* 28.3*  MCV 101.6* 100.0 102.7* 102.9*  PLT 142* 170 149* 458    Basic Metabolic Panel: Recent Labs  Lab 12/03/18 0318 12/04/18 0413 12/05/18 0345 12/06/18 0425 12/07/18 0327  NA 140 141 139 137 137  K 4.2 4.1 4.2 4.1 4.2  CL 113* 113* 109 107 107  CO2 18* 20* 20* 20* 21*  GLUCOSE 168* 118* 98 122* 104*  BUN 36* 34* 38* 39* 42*  CREATININE 2.25* 2.17* 2.16* 2.17* 2.32*  CALCIUM 7.3* 7.3* 7.9* 8.0* 8.1*   GFR: Estimated Creatinine Clearance: 22.9 mL/min (A) (by C-G formula based on SCr of 2.32 mg/dL (H)). Recent Labs  Lab 12/01/18 0700  12/02/18 0317 12/05/18 1000 12/05/18 1609 12/06/18 0425  WBC 8.8 8.6 12.5*  --  12.2*  LATICACIDVEN  --   --   --  1.3  --     Liver Function Tests: Recent Labs  Lab 12/07/18 0327  AST 10*  ALT 7  ALKPHOS 63  BILITOT 0.6  PROT 4.9*  ALBUMIN 2.0*   No results for input(s): LIPASE, AMYLASE in the last 168 hours. No results for input(s): AMMONIA in the last 168 hours.  ABG    Component Value Date/Time   PHART 7.392 05/28/2010 0348   PCO2ART 39.3 05/28/2010 0348   PO2ART 131.0 (H) 05/28/2010 0348   HCO3 23.4 05/28/2010 0348   TCO2 24 08/18/2015 0234   ACIDBASEDEF 0.9 05/28/2010 0348   O2SAT 99.6 05/28/2010 0348     Coagulation Profile: No results for input(s): INR, PROTIME in the last 168 hours.  Cardiac Enzymes: No results for input(s): CKTOTAL, CKMB, CKMBINDEX, TROPONINI in the last 168 hours.  HbA1C: Hgb A1c MFr Bld  Date/Time Value Ref Range Status  08/22/2018 12:24 PM 5.9 4.6 - 6.5 % Final    Comment:    Glycemic Control Guidelines for People with Diabetes:Non Diabetic:  <6%Goal of Therapy: <7%Additional Action Suggested:  >8%   08/07/2013 02:05 PM 5.5 <5.7 % Final    Comment:    (NOTE)                                                                       According to the ADA Clinical Practice Recommendations for 2011, when HbA1c is used as a screening test:  >=6.5%   Diagnostic of Diabetes Mellitus           (if abnormal result is confirmed) 5.7-6.4%   Increased risk of developing Diabetes Mellitus References:Diagnosis and Classification of Diabetes Mellitus,Diabetes [id number],29(BMWUX 1):S62-S69 and Standards of Medical Care in         Diabetes - 2011,Diabetes [id number],0102,72 (Suppl 1):S11-S61.    CBG: No results for input(s): GLUCAP in the last 168 hours.  Review of Systems:   Negative except left leg and occasional right leg pain.  Patient poor historian and becomes agitated if asked too many questions  Past Medical History  He,  has a past  medical history of AAA (abdominal aortic aneurysm) ([place]), Arthritis, BPH (benign prostatic hyperplasia), Complication of anesthesia, Glucose intolerance (impaired glucose tolerance), Hypertension, Hypothyroid, Pancreatitis (15-20 yrs ago), Peripheral neuropathy, and Renal insufficiency.   Surgical History    Past Surgical History:  Procedure Laterality Date  . ABDOMINAL AORTAGRAM N/A 05/06/2014   Procedure: ABDOMINAL Maxcine Ham;  Surgeon: Serafina Mitchell, MD;  Location: [facility] CATH LAB;  Service: Cardiovascular;  Laterality: N/A;  . ABDOMINAL AORTIC ANEURYSM REPAIR  july 2011  . AMPUTATION Right 01/03/2013   Procedure: AMPUTATION DIGIT;  Surgeon: Wylene Simmer, MD;  Location: [facility] ORS;  Service: Orthopedics;  Laterality: Right;  RIGHT 2ND TOE AMPUTATION  . HIP PINNING,CANNULATED Left 08/08/2013   Procedure: LEFT CANNULATED HIP PINNING;  Surgeon: Rozanna Box, MD;  Location: [place];  Service: Orthopedics;  Laterality: Left;  . PROSTATE SURGERY  5 yrs ago   urethral dilation  . STERIOD INJECTION Bilateral 08/08/2013   Procedure: STEROID INJECTION;  Surgeon: Rozanna Box, MD;  Location: [place];  Service: Orthopedics;  Laterality: Bilateral;  . TOTAL HIP REVISION Left 02/10/2014   Procedure: REMOVAL OF SYNTHESE SCREWS LEFT HIP AND CONVERSION LEFT TOTAL HIP ARTHROPLASTY;  Surgeon: Mauri Pole, MD;  Location: [facility] ORS;  Service: Orthopedics;  Laterality: Left;     Social History   reports that he quit smoking about 6 years ago. He has a 18.00 pack-year smoking history. He has never used smokeless tobacco. He reports current alcohol use. He reports that he does not use drugs.   Family History   His family history includes Diabetes in his son; Heart attack in his father and mother; Heart disease in his mother.   Allergies Allergies  Allergen Reactions  . Aspirin Other (See Comments)    Low bp, stomach pains  . Shellfish Allergy      Home Medications  Prior to Admission medications   Medication  Sig Start Date End Date Taking? Authorizing Provider  calcium-vitamin D 250-100 MG-UNIT tablet Take 1 tablet  by mouth 2 (two) times daily.   Yes [provider]  levothyroxine (SYNTHROID, LEVOTHROID) 88 MCG tablet TAKE 1 TABLET EVERY MORNING Patient taking differently: Take 88 mcg by mouth daily before breakfast.  01/13/16  Yes Denita Lung, MD  sodium bicarbonate 650 MG tablet Take 650 mg by mouth 2 (two) times daily.   Yes [provider]  tamsulosin (FLOMAX) 0.4 MG CAPS capsule Take 0.4 mg by mouth at bedtime.   Yes [provider]  Vitamin D, Ergocalciferol, (DRISDOL) 50000 UNITS CAPS capsule Take 50,000 Units by mouth every 7 (seven) days. On Sunday   Yes [provider]  meropenem [facility]) 1 g injection  08/08/18   [provider]  metoprolol tartrate (LOPRESSOR) 25 MG tablet  08/08/18   [provider]        Kennieth Rad, MSN, AGACNP-BC [facility] Pgr: [phone] or if no answer [phone] 12/07/2018, 10:22 PM

## 2018-12-08 DIAGNOSIS — I313 Pericardial effusion (noninflammatory): Secondary | ICD-10-CM

## 2018-12-08 DIAGNOSIS — Z7189 Other specified counseling: Secondary | ICD-10-CM

## 2018-12-08 DIAGNOSIS — Z515 Encounter for palliative care: Secondary | ICD-10-CM

## 2018-12-08 DIAGNOSIS — L8990 Pressure ulcer of unspecified site, unspecified stage: Secondary | ICD-10-CM

## 2018-12-08 DIAGNOSIS — K6812 Psoas muscle abscess: Secondary | ICD-10-CM

## 2018-12-08 DIAGNOSIS — R7989 Other specified abnormal findings of blood chemistry: Secondary | ICD-10-CM

## 2018-12-08 DIAGNOSIS — R799 Abnormal finding of blood chemistry, unspecified: Secondary | ICD-10-CM

## 2018-12-08 DIAGNOSIS — J90 Pleural effusion, not elsewhere classified: Secondary | ICD-10-CM

## 2018-12-08 DIAGNOSIS — E43 Unspecified severe protein-calorie malnutrition: Secondary | ICD-10-CM

## 2018-12-08 DIAGNOSIS — M5440 Lumbago with sciatica, unspecified side: Secondary | ICD-10-CM

## 2018-12-08 DIAGNOSIS — E860 Dehydration: Secondary | ICD-10-CM

## 2018-12-08 DIAGNOSIS — I3139 Other pericardial effusion (noninflammatory): Secondary | ICD-10-CM

## 2018-12-08 DIAGNOSIS — A498 Other bacterial infections of unspecified site: Secondary | ICD-10-CM

## 2018-12-08 LAB — MAGNESIUM: Magnesium: 1.5 mg/dL — ABNORMAL LOW (ref 1.7–2.4)

## 2018-12-08 MED ORDER — CHLORHEXIDINE GLUCONATE CLOTH 2 % EX PADS
6.0000 | MEDICATED_PAD | Freq: Every day | CUTANEOUS | Status: DC
  Administered 2018-12-10: 6 via TOPICAL

## 2018-12-08 MED ORDER — ZINC OXIDE 40 % EX OINT
TOPICAL_OINTMENT | CUTANEOUS | Status: DC | PRN
  Filled 2018-12-08: qty 57

## 2018-12-08 NOTE — Progress Notes (Signed)
PULMONARY / CRITICAL CARE MEDICINE   NAME:  [name]., MRN:  [id number], DOB:  1931/07/17, LOS: 11 ADMISSION DATE:  11/26/2018, CONSULTATION DATE:  12/07/2018 REFERRING MD:  Tyrell Antonio, CHIEF COMPLAINT:  Hypotension  BRIEF HISTORY:    85 y/o male admitted 1/6 with a UTI, found to have a psoas abscess drained> pseudomonas.  Later in hospitalization he was found to have a pericardial effusion with fibrinous effusion, no tamponade.  Moved to [facility] for further intervention.    SIGNIFICANT PAST MEDICAL HISTORY   Blindness> macular deg; CKD, Afib, Hypothyroid, BPH, urinary retention, recurrent UTI, AAA  SIGNIFICANT EVENTS:  1/6 Admitted 1/8 IR drain left psoas abscess 1/17 [facility]  STUDIES:   11/26/2018  MRI abd >> 1.  Motion degraded images. Evaluation is also limited due to lack of intravenous contrast administration. 2.  8.1 x 6.0 x 7.0 cm retroperitoneal mass abutting/involving the left psoas muscle, new from September 2019, favoring infectious/inflammatory etiology, possibly reflecting a psoas abscess. Aggressive neoplasm such as retroperitoneal sarcoma is considered less likely given the degree of rapid progression. 3.  Additional stable ancillary findings as above.  1/9 renal US >> 1. Increased renal echotexture bilaterally consistent with chronic medical renal disease. No hydronephrosis. 2. Multiple bilateral benign-appearing renal cysts. 3. Foley catheter decompresses the bladder. 4. Left psoas lesion demonstrated, better characterized on previous CT and MRI examinations. See previous reports.  11/29/2018 MRI lumbar/ thoracic >> 1. Motion degraded, incomplete examination. Only a single lumbar sequence was obtained. 2. Known left psoas abscess status post percutaneous drainage, incompletely imaged. 3. No gross epidural abscess or diskitis. 4. Chronic thoracic disc degeneration, similar to the 2016 MRI. 5. Incompletely evaluated lumbar spondylosis though without gross  progression from 2016.  12/05/2018 CT abd/pelvis >> 1. Interval decrease in size of the left psoas abscess. 2. Interval progression of body wall edema. 3. Interval progression of bilateral pleural effusions now moderate bilaterally with adjacent lower lobe collapse/consolidation. 4. Otherwise stable exam  12/06/2018 TTE >> Pericardial effusion- moderate free flowing, fluid with fibrinous appearance. Findings exclude clinically significant tamponade.  EF 40-45%; not sufficient to evaluate diastolic function CULTURES:  UC 1/6 >> enterococcus faecalis BC x 2 1/6 >> neg 1/8 left psoas abscess >> pseudomonas aeruginosa (sensitive to cefepime) 1/16 afib w/rvr  ANTIBIOTICS:  Ceftriaxone 1/6 x 1 vanc 1/6 x 1 Ampicillin 1/6 >> 1/13 Cefepime 1/10 >> 1/17 Zosyn 1/9 >> 1/10 Ceftazidime 1/17 >>  LINES/TUBES:  Foley- PTA  1/8 IR left psoas abscess drain >> 1/11 R PICC- single lumen  CONSULTANTS:  IR Cardiology ID  SUBJECTIVE:  Confused, doesn't know where he is or what's going on  CONSTITUTIONAL: BP (!) 94/56   Pulse (!) 48   Temp 98.7 F (37.1 C) (Axillary)   Resp 11   Ht 5' 8.5" (1.74 m)   Wt 75.2 kg   SpO2 98%   BMI 24.84 kg/m   I/O last 3 completed shifts: In: 350 [P.O.:180; I.V.:70; IV Piggyback:100] Out: 8119 [Urine:1725; Drains:15; Stool:2]         PHYSICAL EXAM:  General:  Disheveled, cachectic, chronically ill appearing male lying in bed HENT: NCAT OP clear PULM: CTA B, normal effort CV: Diminished heart sounds, RRR, no mgr GI: BS+, soft, nontender MSK: diminished bulk, no clear bony abnormalities Neuro: confused but able to answer some questions appropriately,    RESOLVED PROBLEM LIST   ASSESSMENT AND PLAN   Psoas abscess > continue drain > continue ceftaz  Fibrinous pericardial  effusion: worrisome for infectious effusion; does not seem like a good surgical candidate > will discuss with cardiology who will need to discuss treatment options with  his son  Hypotension: making urine, talking, not in shock > monitor in ICU setting  Afib with RVR > tele > amiodarone > f/u cardiology recs  Systolic heart failure, new > hold metoprolol with hypotension > monitor UOP  BPH/urinary retention > foley, flomax  Severe malnutrition > f/u nutrition consult > NPO for now until we sort out what sort of intervention he needs  Deconditioning > PT/OT  Goals of care: discussed with son, he would like to avoid surgery because apparently he had a prolonged post op care complicated by recurrent aspiration 10 years ago after vascular surgery; remains full code for now.     SUMMARY OF TODAY'S PLAN:  Read above  Best Practice / Goals of Care / Disposition.   DVT PROPHYLAXIS:sub a hep SUP:n/a NUTRITION: NPO until after discussion with cardiology MOBILITY:PT consult GOALS OF CARE:read above FAMILY DISCUSSIONS: updated son by phone on 1/18 AM DISPOSITION Give back to [facility]  LABS  Glucose No results for input(s): GLUCAP in the last 168 hours.  BMET Recent Labs  Lab 12/05/18 0345 12/06/18 0425 12/07/18 0327  NA 139 137 137  K 4.2 4.1 4.2  CL 109 107 107  CO2 20* 20* 21*  BUN 38* 39* 42*  CREATININE 2.16* 2.17* 2.32*  GLUCOSE 98 122* 104*    Liver Enzymes Recent Labs  Lab 12/07/18 0327  AST 10*  ALT 7  ALKPHOS 63  BILITOT 0.6  ALBUMIN 2.0*    Electrolytes Recent Labs  Lab 12/05/18 0345 12/06/18 0425 12/07/18 0327 12/08/18 0245  CALCIUM 7.9* 8.0* 8.1*  --   MG  --   --   --  1.5*    CBC Recent Labs  Lab 12/02/18 0317 12/05/18 1000 12/06/18 0425  WBC 8.6 12.5* 12.2*  HGB 8.4* 8.3* 8.7*  HCT 26.4* 26.7* 28.3*  PLT 170 149* 161    ABG No results for input(s): PHART, PCO2ART, PO2ART in the last 168 hours.  Coag's No results for input(s): APTT, INR in the last 168 hours.  Sepsis Markers Recent Labs  Lab 12/05/18 1609  LATICACIDVEN 1.3    Cardiac Enzymes No results for input(s): TROPONINI,  PROBNP in the last 168 hours.  Roselie Awkward, MD [facility] Pager: [phone] Cell: [phone] If no response, call [phone]

## 2018-12-08 NOTE — Progress Notes (Signed)
Subjective:  Appreciate PCCM consult and help.  Patient now DNR and comfort care awaiting palliative care consult and transfer to [facility] long hospital.  Objective:  Vital Signs in the last 24 hours: Temp:  [97.5 F (36.4 C)-98.7 F (37.1 C)] 98.2 F (36.8 C) (01/18 0727) Pulse Rate:  [48-117] 48 (01/18 0600) Resp:  [11-22] 11 (01/18 0600) BP: (86-112)/(44-66) 94/56 (01/18 0600) SpO2:  [80 %-100 %] 98 % (01/18 0600)  Intake/Output from previous day: 01/17 0701 - 01/18 0700 In: 350 [P.O.:180; I.V.:70; IV Piggyback:100] Out: 755 [Urine:750; Drains:5] Intake/Output from this shift: No intake/output data recorded.  Physical Exam: Neck: no adenopathy, no carotid bruit, no JVD, supple, symmetrical, trachea midline and thyroid not enlarged, symmetric, no tenderness/mass/nodules Lungs: Decreased breath sounds at bases with occasional rhonchi Heart: irregularly irregular rhythm, S1, S2 normal and 2/6 systolic murmur noted Abdomen: soft, non-tender; bowel sounds normal; no masses,  no organomegaly Extremities no clubbing cyanosis or edema Lab Results: Recent Labs    12/05/18 1000 12/06/18 0425  WBC 12.5* 12.2*  HGB 8.3* 8.7*  PLT 149* 161   Recent Labs    12/06/18 0425 12/07/18 0327  NA 137 137  K 4.1 4.2  CL 107 107  CO2 20* 21*  GLUCOSE 122* 104*  BUN 39* 42*  CREATININE 2.17* 2.32*   No results for input(s): TROPONINI in the last 72 hours.  Invalid input(s): CK, MB Hepatic Function Panel Recent Labs    12/07/18 0327  PROT 4.9*  ALBUMIN 2.0*  AST 10*  ALT 7  ALKPHOS 63  BILITOT 0.6   No results for input(s): CHOL in the last 72 hours. No results for input(s): PROTIME in the last 72 hours.  Imaging: Imaging results have been reviewed and Dg Chest 2 View  Result Date: 12/07/2018 CLINICAL DATA:  Pleural effusion EXAM: CHEST - 2 VIEW COMPARISON:  12/01/2018 FINDINGS: Moderate to large layering bilateral effusions. Cardiomegaly with vascular congestion and  interstitial prominence, likely interstitial edema. No real change since prior study. Right PICC line is unchanged. IMPRESSION: Cardiomegaly with vascular congestion and pulmonary edema. Large layering bilateral effusions. Electronically Signed   By: Rolm Baptise M.D.   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  LOS: 11 days    Charolette Forward 12/08/2018, 9:18 AM

## 2018-12-08 NOTE — Progress Notes (Signed)
Subjective:  " What is going on?  I am in pain"   Antibiotics:  Anti-infectives (From admission, onward)   Start     Dose/Rate Route Frequency Ordered Stop   12/07/18 1800  cefTAZidime (FORTAZ) 2 g in sodium chloride 0.9 % 100 mL IVPB     2 g 200 mL/hr over 30 Minutes Intravenous Every 24 hours 12/07/18 1648     11/30/18 1630  ceFEPIme (MAXIPIME) 2 g in sodium chloride 0.9 % 100 mL IVPB  Status:  Discontinued     2 g 200 mL/hr over 30 Minutes Intravenous Daily-1800 11/30/18 1608 12/07/18 1636   11/30/18 1630  ampicillin (OMNIPEN) 1 g in sodium chloride 0.9 % 100 mL IVPB     1 g 300 mL/hr over 20 Minutes Intravenous 2 times daily 11/30/18 1608 12/03/18 1826   11/29/18 1500  piperacillin-tazobactam (ZOSYN) IVPB 3.375 g  Status:  Discontinued     3.375 g 12.5 mL/hr over 240 Minutes Intravenous Every 8 hours 11/29/18 1415 11/30/18 1608   11/26/18 1800  ampicillin (OMNIPEN) 1 g in sodium chloride 0.9 % 100 mL IVPB  Status:  Discontinued     1 g 300 mL/hr over 20 Minutes Intravenous Every 6 hours 11/26/18 1246 11/29/18 1415   11/26/18 0515  vancomycin (VANCOCIN) IVPB 1000 mg/200 mL premix     1,000 mg 200 mL/hr over 60 Minutes Intravenous  Once 11/26/18 0506 11/26/18 0728   11/26/18 0415  cefTRIAXone (ROCEPHIN) 1 g in sodium chloride 0.9 % 100 mL IVPB     1 g 200 mL/hr over 30 Minutes Intravenous  Once 11/26/18 0410 11/26/18 0457      Medications: Scheduled Meds: . amiodarone  200 mg Oral Daily  . calcium-vitamin D  2 tablet Oral BID  . Chlorhexidine Gluconate Cloth  6 each Topical Daily  . colchicine  0.6 mg Oral Daily  . feeding supplement (ENSURE ENLIVE)  237 mL Oral BID BM  . ferrous sulfate  325 mg Oral BID WC  . levothyroxine  88 mcg Oral QAC breakfast  . multivitamin with minerals  1 tablet Oral Daily  . oxyCODONE-acetaminophen  1 tablet Oral TID  . sodium bicarbonate  650 mg Oral BID  . sodium chloride flush  5 mL Intracatheter Q8H  . tamsulosin  0.4 mg  Oral QHS  . Vitamin D (Ergocalciferol)  50,000 Units Oral Q7 days   Continuous Infusions: . sodium chloride 10 mL/hr at 12/08/18 0700  . cefTAZidime (FORTAZ)  IV Stopped (12/08/18 0245)   PRN Meds:.sodium chloride, acetaminophen **OR** acetaminophen, diphenhydrAMINE-zinc acetate, morphine injection, ondansetron **OR** ondansetron (ZOFRAN) IV, sodium chloride flush    Objective: Weight change:   Intake/Output Summary (Last 24 hours) at 12/08/2018 1106 Last data filed at 12/08/2018 0700 Gross per 24 hour  Intake 350 ml  Output 755 ml  Net -405 ml   Blood pressure 105/63, pulse 97, temperature 98.2 F (36.8 C), temperature source Oral, resp. rate 13, height 5' 8.5" (1.74 m), weight 75.2 kg, SpO2 100 %. Temp:  [97.5 F (36.4 C)-98.7 F (37.1 C)] 98.2 F (36.8 C) (01/18 0727) Pulse Rate:  [48-117] 97 (01/18 0900) Resp:  [11-22] 13 (01/18 0900) BP: (86-113)/(44-74) 105/63 (01/18 0900) SpO2:  [80 %-100 %] 100 % (01/18 0900)  Physical Exam: General: Alert and awake, oriented to location but confused HEENT: anicteric sclera, EOMI CVS regular rate, normal distant heart sounds Chest: , no wheezing, no respiratory distress, fairly clear to auscultation  Abdomen: soft non-distended,  Skin: no rashes Neuro: nonfocal  CBC:    BMET Recent Labs    12/06/18 0425 12/07/18 0327  NA 137 137  K 4.1 4.2  CL 107 107  CO2 20* 21*  GLUCOSE 122* 104*  BUN 39* 42*  CREATININE 2.17* 2.32*  CALCIUM 8.0* 8.1*     Liver Panel  Recent Labs    12/07/18 0327  PROT 4.9*  ALBUMIN 2.0*  AST 10*  ALT 7  ALKPHOS 63  BILITOT 0.6       Sedimentation Rate No results for input(s): ESRSEDRATE in the last 72 hours. C-Reactive Protein No results for input(s): CRP in the last 72 hours.  Micro Results: Recent Results (from the past 720 hour(s))  Urine culture     Status: Abnormal   Collection Time: 11/19/18  4:40 PM  Result Value Ref Range Status   Specimen Description   Final     URINE, CATHETERIZED Performed at [facility], [address] [place], [place] [address]    Special Requests   Final    Normal Performed at [facility], [address] [place], [place] [address]    Culture >=100,000 COLONIES/mL ENTEROCOCCUS FAECALIS (A)  Final   Report Status 11/22/2018 FINAL  Final   Organism ID, Bacteria ENTEROCOCCUS FAECALIS (A)  Final      Susceptibility   Enterococcus faecalis - MIC*    AMPICILLIN <=2 SENSITIVE Sensitive     LEVOFLOXACIN 1 SENSITIVE Sensitive     NITROFURANTOIN <=16 SENSITIVE Sensitive     VANCOMYCIN 1 SENSITIVE Sensitive     * >=100,000 COLONIES/mL ENTEROCOCCUS FAECALIS  Urine Culture     Status: Abnormal   Collection Time: 11/26/18  3:42 AM  Result Value Ref Range Status   Specimen Description   Final    URINE, RANDOM Performed at [facility], [address] [place], [place] [address]    Special Requests   Final    NONE Performed at [facility], [address] [place], [place] [address]    Culture >=100,000 COLONIES/mL ENTEROCOCCUS FAECALIS (A)  Final   Report Status 11/28/2018 FINAL  Final   Organism ID, Bacteria ENTEROCOCCUS FAECALIS (A)  Final      Susceptibility   Enterococcus faecalis - MIC*    AMPICILLIN <=2 SENSITIVE Sensitive     LEVOFLOXACIN 2 SENSITIVE Sensitive     NITROFURANTOIN <=16 SENSITIVE Sensitive     VANCOMYCIN 1 SENSITIVE Sensitive     * >=100,000 COLONIES/mL ENTEROCOCCUS FAECALIS  Blood culture (routine x 2)     Status: None   Collection Time: 11/26/18  5:30 AM  Result Value Ref Range Status   Specimen Description   Final    BLOOD RIGHT ANTECUBITAL Performed at [facility], [address] [place], [place] [address]    Special Requests   Final    BOTTLES DRAWN AEROBIC AND ANAEROBIC Blood Culture adequate volume Performed at [facility], [address] [place], [place] [address]    Culture   Final    NO GROWTH 5 DAYS Performed at  [facility], [place] [address], [place], [place] [address]    Report Status 12/01/2018 FINAL  Final  Blood culture (routine x 2)     Status: None   Collection Time: 11/26/18  5:45 AM  Result Value Ref Range Status   Specimen Description   Final  BLOOD LEFT WRIST Performed at [facility], [address] [place], [place] [address]    Special Requests   Final    BOTTLES DRAWN AEROBIC AND ANAEROBIC Blood Culture adequate volume Performed at [facility], [address] [place], [place] [address]    Culture   Final    NO GROWTH 5 DAYS Performed at [facility], [place] [address], [place], [place] [address]    Report Status 12/01/2018 FINAL  Final  Aerobic/Anaerobic Culture (surgical/deep wound)     Status: None   Collection Time: 11/28/18  9:54 AM  Result Value Ref Range Status   Specimen Description   Final    ABSCESS LEFT PSOAS Performed at [facility] [address], [place], [place] [address]    Special Requests   Final    Normal Performed at [facility], [place] [address], [place], [place] [address]    Gram Stain   Final    ABUNDANT WBC PRESENT, PREDOMINANTLY PMN RARE GRAM NEGATIVE RODS    Culture   Final    MODERATE PSEUDOMONAS AERUGINOSA NO ANAEROBES ISOLATED Performed at [facility], [place] [address] [place], [place] [address]    Report Status 12/03/2018 FINAL  Final   Organism ID, Bacteria PSEUDOMONAS AERUGINOSA  Final      Susceptibility   Pseudomonas aeruginosa - MIC*    CEFTAZIDIME 4 SENSITIVE Sensitive     CIPROFLOXACIN >=4 RESISTANT Resistant     GENTAMICIN >=16 RESISTANT Resistant     IMIPENEM >=16 RESISTANT Resistant     CEFEPIME 8 SENSITIVE Sensitive     * MODERATE PSEUDOMONAS AERUGINOSA  MRSA PCR Screening     Status: None   Collection Time: 12/07/18  8:36 PM  Result Value Ref Range Status   MRSA by PCR NEGATIVE NEGATIVE Final    Comment:        The GeneXpert MRSA Assay  (FDA approved for NASAL specimens only), is one component of a comprehensive MRSA colonization surveillance program. It is not intended to diagnose MRSA infection nor to guide or monitor treatment for MRSA infections. Performed at [facility], [place] [address], [place], [place] [address]     Studies/Results: Dg Chest 2 View  Result Date: 12/07/2018 CLINICAL DATA:  Pleural effusion EXAM: CHEST - 2 VIEW COMPARISON:  12/01/2018 FINDINGS: Moderate to large layering bilateral effusions. Cardiomegaly with vascular congestion and interstitial prominence, likely interstitial edema. No real change since prior study. Right PICC line is unchanged. IMPRESSION: Cardiomegaly with vascular congestion and pulmonary edema. Large layering bilateral effusions. Electronically Signed   By: Rolm Baptise M.D.   On: 12/07/2018 10:07      Assessment/Plan:  INTERVAL HISTORY:   Patient transferred to [facility], ICU but felt not to be an appropriate candidate to go to the operating room after discussions between critical care cardiology and the patient's son.  My understanding is that there is going to be changed to full comfort care.  Palliative care consult is pending   Principal Problem:   Dehydration Active Problems:   Hypothyroid   CKD (chronic kidney disease), stage III (HCC)   Hypocalcemia   Urinary tract infection associated with indwelling urethral catheter (HCC)   Back pain   Retroperitoneal mass   Anemia   Protein-calorie malnutrition, severe   Pressure injury of skin    [name]. is a 85 y.o. male with  PsA psoas muscle abscess  also found to have concurrent pericardial effusion, of unknown etiology, concerning for infection vs. Malignancy vs. transudative process. -with moderate effusion concern for tamponade physiology sent to [facility] for iteration of IR drain versus window procedure.  Discussions between critical care cardiology and the patient's son have led to the conclusion  that it would be too risky to have the patient undergo intervention that could potentially put him at risk for dying during that intervention.  Instead my understanding is arrangements are beginning to made to transition to full comfort care.  When I talked the patient himself he seemed completely unaware of these discussions so hopefully further discussions with palliative care and family will help.   Pericardial effusion: See above discussion: For now we will continue ceftazidime presumption that it is a purulent pericardial effusion  Psoas muscle abscess: Continue with ceftaz edema and flushing  GOALS of CARE: For now we are happy to continue with ceftazidime but if the patient is to truly be transitioned to for care and ultimately is going to be sent to hospice we will not have an oral antibiotic to treat him and hospice should continued antimicrobial therapy be desired.  We await further discussions from palliative care and the primary team.     LOS: 11 days   Alcide Evener 12/08/2018, 11:06 AM

## 2018-12-08 NOTE — Progress Notes (Signed)
PT Cancellation Note  Patient Details Name: [name]. MRN: [id number] DOB: 09-Jan-1931   Cancelled Treatment:    Reason Eval/Treat Not Completed: Other (comment). Per chart review, pt transitioning to comfort care. Patient is being discharged from PT services secondary to: Progress and discharge plan and discussed with patient and pt stating he does not have need for further acute PT services. Pt stating, "I just can't move, it causes me agony. They were walking me at the other hospital, but it obviously didn't help." PT asked pt if he had any functional mobility goals, and pt stating, "I want to live, but they told me this morning I'm going die." Offered chaplain services for further conversation and support, but pt declining.   Ellamae Sia, PT, DPT Acute Rehabilitation Services Pager [phone] Office [phone]    Willy Eddy 12/08/2018, 1:51 PM

## 2018-12-08 NOTE — Progress Notes (Signed)
Paged Critical care MD concerning pain medicine. Awaiting return call.

## 2018-12-08 NOTE — Progress Notes (Signed)
[facility] PCCM  Spoke with the patient's son, he feels that surgery would be too much for him.  I spoke with Dr. Doylene Canard who is actually off today but was kind enough to answer the phone and discuss his situation.  He felt that an intervention would not be appropriate as he was not in tamponade and his overall frail state with multiple acute problems would make an intervention more harmful than helpful.  I have tried calling his hospitalist from yesterday and the on call cardiology team but was unable to connect with either.  Will consult palliative medicine as I think the best approach is to focus goals of care on comfort.  His son notes several years of poor quality of life.   Roselie Awkward, MD [place] PCCM Pager: [phone] Cell: [phone] If no response, call [phone]

## 2018-12-08 NOTE — Progress Notes (Signed)
Received consult for goals of care and symptom management.  Appointment scheduled with patient's son for 12/09/2018 at 10 AM Thank you for the consult, Romona Curls, NP

## 2018-12-08 NOTE — Progress Notes (Signed)
[facility] PCCM  I spoke to the patient's son who agrees with full comfort measures and placement in inpatient hospice if appropriate.  Have consulted Palliative Medicine.  Will transfer back to [facility] service, med-surg bed.  Roselie Awkward, MD [place] PCCM Pager: [phone] Cell: [phone] If no response, call [phone]

## 2018-12-08 NOTE — Progress Notes (Signed)
Patient refusing to be turned off his bottom.  Patient placed on low bed with floor mat in place

## 2018-12-09 DIAGNOSIS — Z7189 Other specified counseling: Secondary | ICD-10-CM

## 2018-12-09 DIAGNOSIS — N183 Chronic kidney disease, stage 3 (moderate): Secondary | ICD-10-CM

## 2018-12-09 DIAGNOSIS — A498 Other bacterial infections of unspecified site: Secondary | ICD-10-CM

## 2018-12-09 DIAGNOSIS — Z515 Encounter for palliative care: Secondary | ICD-10-CM

## 2018-12-09 MED ORDER — HYDROMORPHONE HCL 1 MG/ML IJ SOLN
0.5000 mg | Freq: Four times a day (QID) | INTRAMUSCULAR | Status: DC
  Administered 2018-12-09 – 2018-12-10 (×5): 0.5 mg via INTRAVENOUS
  Filled 2018-12-09 (×6): qty 1

## 2018-12-09 MED ORDER — HYDROMORPHONE HCL 1 MG/ML IJ SOLN
0.2500 mg | INTRAMUSCULAR | Status: DC | PRN
  Administered 2018-12-09 – 2018-12-10 (×6): 0.5 mg via INTRAVENOUS
  Filled 2018-12-09 (×5): qty 1

## 2018-12-09 MED ORDER — HYDROMORPHONE HCL 1 MG/ML IJ SOLN
0.2500 mg | INTRAMUSCULAR | Status: DC | PRN
  Administered 2018-12-09: 0.5 mg via INTRAVENOUS
  Filled 2018-12-09 (×2): qty 1

## 2018-12-09 MED ORDER — OXYCODONE HCL 5 MG PO TABS: 5.0000 mg | ORAL_TABLET | ORAL | Status: DC | PRN

## 2018-12-09 NOTE — Consult Note (Signed)
Consultation Note Date: 12/09/2018   Patient Name: [name].  DOB: [birth year]  MRN: [id number]  Age / Sex: 85 y.o., male  PCP: Saguier, Iris Pert Referring Physician: Dessa Phi, DO  Reason for Consultation: Establishing goals of care, Inpatient hospice referral, Pain control and Psychosocial/spiritual support  HPI/Patient Profile: 85 y.o. male  with past medical history of chronic kidney disease stage IV (baseline creatinine 2.2-2.5), legally blind secondary to macular degeneration, atrial fib (not on any anticoagulation), AAA repair 2011, hypothyroidism, BPH requiring in and out cath, recurrent UTIs, history of left hip arthroplasty admitted on 11/26/2018 with left-sided abdominal pain radiating to left leg.  Patient was found to have another UTI.  Also imaging revealed psoas muscle abscess.  Abscess was drained on 11/28/2018 430 cc of puslike material which grew Pseudomonas.  This mass is 8.1 x 6.0 x 7.0.  This is new since imaging performed on 07/2018.  Additional findings while in the hospital were a pericardial effusion.  He is not a candidate for a pericardial window because of other comorbidities.  Consulted for goals of care.   Clinical Assessment and Goals of Care: Patient seen, chart reviewed.  Met with patient and patient's son Lennette Bihari and his wife Myra.  When I first went in to see patient, he was in extreme amount of pain with associated irritability, poor concentration.  Started IV Dilaudid and it did have beneficial effect.  Per chart review, CCM spoke at length to son Lennette Bihari about pursuing comfort care.  Patient has been on antibiotics since October and still developed this  abscess.  It is not known whether the fluid around the heart is infectious or not.  Discussed options with family of continuing with antibiotics which would likely need transfer to a skilled nursing facility but that would  only address potentially the left psoas infection not his cardiac issue; residential hospice with focus on symptoms, pain relief and discontinuation of IV antibiotics.  I did reach out to infectious disease to see if there was an oral option that potentially could palliate infection but there is not an oral agent that is sensitive  Patient is confused, poor concentration but he is still capable of participating to a limited degree and palliative discussions.  His son Lennette Bihari is his healthcare proxy. Mendel Corning [phone]    SUMMARY OF RECOMMENDATIONS   DNR Family is hopeful for residential hospice.  Offered choice per Medicare guidelines.  Family has elected hospice of the [facility].  I have placed social work consult as well as contacting hospice of the [facility] liaison directly for referral.  Again son is aware that IV antibiotics IV fluids transfusions are not available at inpatient hospice.  Treatment focus will shift to symptom management and comfort Continue current treatment plan including IV antibiotics until patient is discharged to residential hospice Code Status/Advance Care Planning:  DNR    Symptom Management:   Pain: Start scheduled Dilaudid at 0.5 mg every 6 hours and 0.25-0.5 every 2  as needed.  Will monitor PRN dosing and the need to switch to a continuous infusion.  Palliative Prophylaxis:   Aspiration, Bowel Regimen, Delirium Protocol, Eye Care, Frequent Pain Assessment, Oral Care, Palliative Wound Care and Turn Reposition  Additional Recommendations (Limitations, Scope, Preferences):  Avoid Hospitalization, Minimize Medications, Initiate Comfort Feeding, No Artificial Feeding, No Blood Transfusions, No Chemotherapy, No Diagnostics, No Hemodialysis, No Radiation, No Surgical Procedures and No Tracheostomy  Psycho-social/Spiritual:   Desire for further Chaplaincy support:no  Additional Recommendations: Referral to Community  Resources   Prognosis:   < 2 weeks in the setting of cardiac tamponade (patient is not a candidate for pericardial window), left-sided psoas abscess, chronic kidney disease stage IV BPH requiring in and out catheterizations and recurrent UTIs, new A. fib with RVR.  Patient is at high risk for acute cardiopulmonary failure  Discharge Planning: Hospice facility      Primary Diagnoses: Present on Admission: . Dehydration . Back pain . Urinary tract infection associated with indwelling urethral catheter ([employer]) . Hypothyroid . CKD (chronic kidney disease), stage III ([place]) . Hypocalcemia . Anemia   I have reviewed the medical record, interviewed the patient and family, and examined the patient. The following aspects are pertinent.  Past Medical History:  Diagnosis Date  . AAA (abdominal aortic aneurysm) ([place])   . Arthritis   . BPH (benign prostatic hyperplasia)   . Complication of anesthesia    "a tough time"  -pt unable to explain  . Glucose intolerance (impaired glucose tolerance)   . Hypertension   . Hypothyroid   . Pancreatitis 15-20 yrs ago  . Peripheral neuropathy    fingers and toes  . Renal insufficiency    Social History   Socioeconomic History  . Marital status: Single    Spouse name: Not on file  . Number of children: Not on file  . Years of education: Not on file  . Highest education level: Not on file  Occupational History  . Not on file  Social Needs  . Financial resource strain: Not on file  . Food insecurity:    Worry: Not on file    Inability: Not on file  . Transportation needs:    Medical: Not on file    Non-medical: Not on file  Tobacco Use  . Smoking status: Former Smoker    Packs/day: 0.30    Years: 60.00    Pack years: 18.00    Last attempt to quit: 10/02/2012    Years since quitting: 6.1  . Smokeless tobacco: Never Used  Substance and Sexual Activity  . Alcohol use: Yes    Alcohol/week: 0.0 standard drinks    Comment: occasioally  .  Drug use: No  . Sexual activity: Not on file  Lifestyle  . Physical activity:    Days per week: Not on file    Minutes per session: Not on file  . Stress: Not on file  Relationships  . Social connections:    Talks on phone: Not on file    Gets together: Not on file    Attends religious service: Not on file    Active member of club or organization: Not on file    Attends meetings of clubs or organizations: Not on file    Relationship status: Not on file  Other Topics Concern  . Not on file  Social History Narrative  . Not on file   Family History  Problem Relation Age of Onset  . Heart attack Mother   .  Heart disease Mother   . Heart attack Father   . Diabetes Son        amputation-BKA    Scheduled Meds: . amiodarone  200 mg Oral Daily  . calcium-vitamin D  2 tablet Oral BID  . Chlorhexidine Gluconate Cloth  6 each Topical Daily  . colchicine  0.6 mg Oral Daily  . feeding supplement (ENSURE ENLIVE)  237 mL Oral BID BM  . ferrous sulfate  325 mg Oral BID WC  .  HYDROmorphone (DILAUDID) injection  0.5 mg Intravenous Q6H  . levothyroxine  88 mcg Oral QAC breakfast  . multivitamin with minerals  1 tablet Oral Daily  . sodium bicarbonate  650 mg Oral BID  . sodium chloride flush  5 mL Intracatheter Q8H  . tamsulosin  0.4 mg Oral QHS  . Vitamin D (Ergocalciferol)  50,000 Units Oral Q7 days   Continuous Infusions: . sodium chloride 10 mL/hr at 12/09/18 0645  . cefTAZidime (FORTAZ)  IV 2 g (12/08/18 1744)   PRN Meds:.sodium chloride, acetaminophen **OR** acetaminophen, diphenhydrAMINE-zinc acetate, HYDROmorphone (DILAUDID) injection, liver oil-zinc oxide, ondansetron **OR** ondansetron (ZOFRAN) IV, sodium chloride flush Medications Prior to Admission:  Prior to Admission medications   Medication Sig Start Date End Date Taking? Authorizing Provider  calcium-vitamin D 250-100 MG-UNIT tablet Take 1 tablet by mouth 2 (two) times daily.   Yes [provider]    levothyroxine (SYNTHROID, LEVOTHROID) 88 MCG tablet TAKE 1 TABLET EVERY MORNING Patient taking differently: Take 88 mcg by mouth daily before breakfast.  01/13/16  Yes Denita Lung, MD  sodium bicarbonate 650 MG tablet Take 650 mg by mouth 2 (two) times daily.   Yes [provider]  tamsulosin (FLOMAX) 0.4 MG CAPS capsule Take 0.4 mg by mouth at bedtime.   Yes [provider]  Vitamin D, Ergocalciferol, (DRISDOL) 50000 UNITS CAPS capsule Take 50,000 Units by mouth every 7 (seven) days. On Sunday   Yes [provider]  meropenem (MERREM) 1 g injection  08/08/18   [provider]  metoprolol tartrate (LOPRESSOR) 25 MG tablet  08/08/18   [provider]   Allergies  Allergen Reactions  . Aspirin Other (See Comments)    Low bp, stomach pains  . Shellfish Allergy    Review of Systems  Unable to perform ROS: Mental status change    Physical Exam Vitals signs and nursing note reviewed.  Constitutional:      Appearance: He is ill-appearing.  HENT:     Head: Normocephalic and atraumatic.  Neck:     Musculoskeletal: Normal range of motion.  Cardiovascular:     Rate and Rhythm: Tachycardia present.  Pulmonary:     Effort: Pulmonary effort is normal.  Musculoskeletal:     Comments: Unable to move LE 2/2 pain  Skin:    General: Skin is warm and dry.     Comments: JP drain L buttock/hip  Neurological:     Mental Status: He is alert.     Comments: Oriented to self, in the hospital but confused as to his clinical condition  Psychiatric:     Comments: Pt irritable likely 2/2 to pain, poor concentration     Vital Signs: BP 109/81 (BP Location: Left Arm)   Pulse 78   Temp 97.7 F (36.5 C) (Oral)   Resp 17   Ht _0  (1.753 m)   Wt 71.6 kg   SpO2 100%   BMI 23.31 kg/m  Pain Scale: 0-10 POSS *See Group  Information*: 1-Acceptable,Awake and alert Pain Score: 6    SpO2: SpO2: 100 % O2 Device:SpO2: 100 % O2 Flow Rate: .O2 Flow Rate  (L/min): 2 L/min  IO: Intake/output summary:   Intake/Output Summary (Last 24 hours) at 12/09/2018 1221 Last data filed at 12/09/2018 8721 Gross per 24 hour  Intake 1304.96 ml  Output 266 ml  Net 1038.96 ml    LBM: Last BM Date: 12/09/18 Baseline Weight: Weight: 75.2 kg Most recent weight: Weight: 71.6 kg     Palliative Assessment/Data:   Flowsheet Rows     Most Recent Value  Intake Tab  Referral Department  Critical care  Unit at Time of Referral  Med/Surg Unit  Palliative Care Primary Diagnosis  Cardiac  Date Notified  12/08/18  Palliative Care Type  New Palliative care  Reason for referral  Pain, Non-pain Symptom, Clarify Goals of Care, Psychosocial or Spiritual support, Counsel Regarding Hospice  Date of Admission  11/27/18  Date first seen by Palliative Care  12/09/18  # of days Palliative referral response time  1 Day(s)  # of days IP prior to Palliative referral  11  Clinical Assessment  Palliative Performance Scale Score  30%  Pain Max last 24 hours  Not able to report  Pain Min Last 24 hours  Not able to report  Dyspnea Max Last 24 Hours  Not able to report  Dyspnea Min Last 24 hours  Not able to report  Nausea Max Last 24 Hours  Not able to report  Nausea Min Last 24 Hours  Not able to report  Anxiety Max Last 24 Hours  Not able to report  Anxiety Min Last 24 Hours  Not able to report  Other Max Last 24 Hours  Not able to report  Psychosocial & Spiritual Assessment  Palliative Care Outcomes  Patient/Family meeting held?  Yes  Who was at the meeting?  pt, son, DIL  Patient/Family wishes: Interventions discontinued/not started   Mechanical Ventilation, BiPAP, Vasopressors, NIPPV, Trach, Transfer out of ICU, Hemodialysis, PEG, Tube feedings/TPN, Transfusion  Palliative Care follow-up planned  Yes, Facility      Time In: 0930 Time Out: 1050 Time Total: 80 min Greater than 50%  of this time was spent counseling and coordinating care related to the above  assessment and plan. Staffed with Dr. Maylene Roes and Dr. Tommy Medal  Signed by: Dory Horn, NP   Please contact Palliative Medicine Team phone at [phone] for questions and concerns.  For individual provider: See Shea Evans

## 2018-12-09 NOTE — Progress Notes (Signed)
      INFECTIOUS DISEASE ATTENDING ADDENDUM:   Date: 12/09/2018  Patient name: [name].  Medical record number: [id number]  Date of birth: [birth year]   Patient will go to hospice at [place] with continuing anti pseudomonal abx while in house  Will sign off.  Please call with further questions.      Alcide Evener 12/09/2018, 6:43 PM

## 2018-12-09 NOTE — Progress Notes (Signed)
Subjective:  Denies any chest pain or shortness of breath.  States feels hungry.  Objective:  Vital Signs in the last 24 hours: Temp:  [97.5 F (36.4 C)-97.7 F (36.5 C)] 97.7 F (36.5 C) (01/19 0347) Pulse Rate:  [73-85] 78 (01/19 0347) Resp:  [15-19] 17 (01/19 0347) BP: (93-109)/(49-81) 109/81 (01/19 0347) SpO2:  [90 %-100 %] 100 % (01/19 0347) Weight:  [71.6 kg] 71.6 kg (01/18 1439)  Intake/Output from previous day: 01/18 0701 - 01/19 0700 In: 1599 [P.O.:1200; I.V.:294; IV Piggyback:100] Out: 419 [Urine:400; Drains:18; Stool:1] Intake/Output from this shift: Total I/O In: 236 [P.O.:236] Out: -   Physical Exam: Neck: no adenopathy, no carotid bruit, no JVD and supple, symmetrical, trachea midline Lungs: decreased breath sounds at bases with occasional rhonchi noted Heart: irregularly irregular rhythm, S1, S2 normal and 2/6 systolic murmur noted Abdomen: soft, non-tender; bowel sounds normal; no masses,  no organomegaly Extremities: extremities normal, atraumatic, no cyanosis or edema  Lab Results: No results for input(s): WBC, HGB, PLT in the last 72 hours. Recent Labs    12/07/18 0327  NA 137  K 4.2  CL 107  CO2 21*  GLUCOSE 104*  BUN 42*  CREATININE 2.32*   No results for input(s): TROPONINI in the last 72 hours.  Invalid input(s): CK, MB Hepatic Function Panel Recent Labs    12/07/18 0327  PROT 4.9*  ALBUMIN 2.0*  AST 10*  ALT 7  ALKPHOS 63  BILITOT 0.6   No results for input(s): CHOL in the last 72 hours. No results for input(s): PROTIME in the last 72 hours.  Imaging: Imaging results have been reviewed and No results found.  Cardiac Studies:  Assessment/Plan:  Atrial fibrillation with controlled ventricular response Hypotension Sepsis with pseudomonas infection Left Psoas abscess with drainage Hypothyroidism Chronic iron deficiency anemia CKD, IV Pericardial effusion Bilateral pleural effusion Pleuropericarditis questionable  etiology UTI with indwelling catheter Plan Continue present management. Awaiting hospice consult. Dr. Doylene Canard  to follow  as needed  LOS: 12 days    Charolette Forward 12/09/2018, 12:00 PM

## 2018-12-09 NOTE — Progress Notes (Signed)
PROGRESS NOTE    [name].  WNU:[id number] DOB: June 20, 1931 DOA: 11/26/2018 PCP: Mackie Pai, PA-C     Brief Narrative:  [name]. is a 85 yo male with past medical history for blindness, chronic kidney disease stage IV, A. fib, hypothyroidism, BPH, abdominal aortic aneurysm status post repair who initially presented on 11/27/2018 with complaints of left flank pain and leg pain.  He was admitted for diagnosis of retroperitoneal mass, rule out abscess.  He was determined to have left psoas muscle abscess, has underwent IR evaluation for drain placement on 11/28/2018.  Repeat CT revealed decrease in size of left psoas abscess.  He was also found to have atrial fibrillation with RVR.  Infectious disease, cardiology, critical care consulted due to concern for infected pericardial effusion, without tamponade.  He was transferred to [facility] for possible cardiothoracic surgery consultation.  In discussion with critical care medicine, patient's son, they have decided on conservative management, palliative care consulted.  New events last 24 hours / Subjective: Patient with poor insight into his medical process.  States that he came in for rectal pain and his pain is not adequately treated, very upset that we keep finding other medical problems.  Assessment & Plan:   Principal Problem:   Psoas abscess ([place]) Active Problems:   Hypothyroid   CKD (chronic kidney disease), stage III ([facility])   Hypocalcemia   Urinary tract infection associated with indwelling urethral catheter ([facility])   Dehydration   Back pain   Anemia   Protein-calorie malnutrition, severe   Pressure injury of skin   Abnormal serum creatinine level   Pleural effusion   Pericardial effusion   Pseudomonas infection   Palliative care encounter   Left psoas muscle abscess  -Status post left psoas abscess drain placement by by IR on 11/28/2018.  Repeat CT revealed improvement in size of abscess by CT A/P on 1/15    -Blood cultures negative -Wound culture with pseudomonas, sensitive to ceftazidime   Fibrinous pericardial effusion -CCM and cardiology discussed, worrisome for infectious effusion, patient not a great candidate for cardiothoracic evaluation/surgery -Ceftazidime for now   Atrial fibrillation with RVR -Appreciate cardiology -Continue amiodarone. Metoprolol held due to hypotension. Digoxin discontinued  -Rate controlled this morning   Acute systolic heart failure -Lasix on hold for now   Severe malnutrition -Appreciate dietitian  Hypothyroidism -Continue with Synthroid   Chronic kidney disease stage IV -Baseline Cr 2.6-2.8 -Stable   BPH/ sp TURP 2012, hx of bulbar urethral stricturewith chronic indwelling foley catheter -Chronic foley in place. Continue flomax   Enterococcus UTI, POA, related to catheter -Treated with ampicillin    DVT prophylaxis: SCD Code Status: DNR Family Communication: No family at bedside Disposition Plan: Pending further conversation with palliative care medicine    Antimicrobials:  Anti-infectives (From admission, onward)   Start     Dose/Rate Route Frequency Ordered Stop   12/07/18 1800  cefTAZidime (FORTAZ) 2 g in sodium chloride 0.9 % 100 mL IVPB     2 g 200 mL/hr over 30 Minutes Intravenous Every 24 hours 12/07/18 1648     11/30/18 1630  ceFEPIme (MAXIPIME) 2 g in sodium chloride 0.9 % 100 mL IVPB  Status:  Discontinued     2 g 200 mL/hr over 30 Minutes Intravenous Daily-1800 11/30/18 1608 12/07/18 1636   11/30/18 1630  ampicillin (OMNIPEN) 1 g in sodium chloride 0.9 % 100 mL IVPB     1 g 300 mL/hr over 20 Minutes  Intravenous 2 times daily 11/30/18 1608 12/03/18 1826   11/29/18 1500  piperacillin-tazobactam (ZOSYN) IVPB 3.375 g  Status:  Discontinued     3.375 g 12.5 mL/hr over 240 Minutes Intravenous Every 8 hours 11/29/18 1415 11/30/18 1608   11/26/18 1800  ampicillin (OMNIPEN) 1 g in sodium chloride 0.9 % 100 mL IVPB  Status:   Discontinued     1 g 300 mL/hr over 20 Minutes Intravenous Every 6 hours 11/26/18 1246 11/29/18 1415   11/26/18 0515  vancomycin (VANCOCIN) IVPB 1000 mg/200 mL premix     1,000 mg 200 mL/hr over 60 Minutes Intravenous  Once 11/26/18 0506 11/26/18 0728   11/26/18 0415  cefTRIAXone (ROCEPHIN) 1 g in sodium chloride 0.9 % 100 mL IVPB     1 g 200 mL/hr over 30 Minutes Intravenous  Once 11/26/18 0410 11/26/18 0457       Objective: Vitals:   12/08/18 1300 12/08/18 1439 12/08/18 2058 12/09/18 0347  BP: 108/62 (!) 93/49 100/65 109/81  Pulse: 80 73 80 78  Resp: 19 18 17 17   Temp:  97.6 F (36.4 C) (!) 97.5 F (36.4 C) 97.7 F (36.5 C)  TempSrc:  Oral Oral Oral  SpO2: 96% 90% 100% 100%  Weight:  71.6 kg    Height:  5\' 9"  (1.753 m)      Intake/Output Summary (Last 24 hours) at 12/09/2018 1123 Last data filed at 12/09/2018 0942 Gross per 24 hour  Intake 1674.96 ml  Output 419 ml  Net 1255.96 ml   Filed Weights   11/26/18 0208 12/08/18 1439  Weight: 75.2 kg 71.6 kg    Examination:  General exam: Appears upset, but not in any physical distress  Respiratory system: Clear to auscultation. Respiratory effort normal. Cardiovascular system: S1 & S2 heard, Irreg. No JVD, murmurs, rubs, gallops or clicks. No pedal edema. Gastrointestinal system: Abdomen is nondistended, soft and nontender. No organomegaly or masses felt. Normal bowel sounds heard. Central nervous system: Alert, nonfocal  Extremities: Symmetric 5 x 5 power. Psychiatry: Judgement and insight appear poor   Data Reviewed: I have personally reviewed following labs and imaging studies  CBC: Recent Labs  Lab 12/05/18 1000 12/06/18 0425  WBC 12.5* 12.2*  HGB 8.3* 8.7*  HCT 26.7* 28.3*  MCV 102.7* 102.9*  PLT 149* 376   Basic Metabolic Panel: Recent Labs  Lab 12/03/18 0318 12/04/18 0413 12/05/18 0345 12/06/18 0425 12/07/18 0327 12/08/18 0245  NA 140 141 139 137 137  --   K 4.2 4.1 4.2 4.1 4.2  --   CL 113*  113* 109 107 107  --   CO2 18* 20* 20* 20* 21*  --   GLUCOSE 168* 118* 98 122* 104*  --   BUN 36* 34* 38* 39* 42*  --   CREATININE 2.25* 2.17* 2.16* 2.17* 2.32*  --   CALCIUM 7.3* 7.3* 7.9* 8.0* 8.1*  --   MG  --   --   --   --   --  1.5*   GFR: Estimated Creatinine Clearance: 23.3 mL/min (A) (by C-G formula based on SCr of 2.32 mg/dL (H)). Liver Function Tests: Recent Labs  Lab 12/07/18 0327  AST 10*  ALT 7  ALKPHOS 63  BILITOT 0.6  PROT 4.9*  ALBUMIN 2.0*   No results for input(s): LIPASE, AMYLASE in the last 168 hours. No results for input(s): AMMONIA in the last 168 hours. Coagulation Profile: No results for input(s): INR, PROTIME in the last 168 hours. Cardiac Enzymes:  No results for input(s): CKTOTAL, CKMB, CKMBINDEX, TROPONINI in the last 168 hours. BNP (last 3 results) Recent Labs    08/22/18 1224  PROBNP 371.0*   HbA1C: No results for input(s): HGBA1C in the last 72 hours. CBG: No results for input(s): GLUCAP in the last 168 hours. Lipid Profile: No results for input(s): CHOL, HDL, LDLCALC, TRIG, CHOLHDL, LDLDIRECT in the last 72 hours. Thyroid Function Tests: Recent Labs    12/06/18 1458  TSH 8.780*  FREET4 0.96   Anemia Panel: Recent Labs    12/06/18 1458  VITAMINB12 663  FOLATE 17.5  FERRITIN 579*  TIBC 103*  IRON 23*  RETICCTPCT 3.8*   Sepsis Labs: Recent Labs  Lab 12/05/18 1609  LATICACIDVEN 1.3    Recent Results (from the past 240 hour(s))  MRSA PCR Screening     Status: None   Collection Time: 12/07/18  8:36 PM  Result Value Ref Range Status   MRSA by PCR NEGATIVE NEGATIVE Final    Comment:        The GeneXpert MRSA Assay (FDA approved for NASAL specimens only), is one component of a comprehensive MRSA colonization surveillance program. It is not intended to diagnose MRSA infection nor to guide or monitor treatment for MRSA infections. Performed at [facility], [place] [address], [place], [place] [address]         Radiology Studies: No results found.    Scheduled Meds: . amiodarone  200 mg Oral Daily  . calcium-vitamin D  2 tablet Oral BID  . Chlorhexidine Gluconate Cloth  6 each Topical Daily  . colchicine  0.6 mg Oral Daily  . feeding supplement (ENSURE ENLIVE)  237 mL Oral BID BM  . ferrous sulfate  325 mg Oral BID WC  . levothyroxine  88 mcg Oral QAC breakfast  . multivitamin with minerals  1 tablet Oral Daily  . sodium bicarbonate  650 mg Oral BID  . sodium chloride flush  5 mL Intracatheter Q8H  . tamsulosin  0.4 mg Oral QHS  . Vitamin D (Ergocalciferol)  50,000 Units Oral Q7 days   Continuous Infusions: . sodium chloride 10 mL/hr at 12/09/18 0645  . cefTAZidime (FORTAZ)  IV 2 g (12/08/18 1744)     LOS: 12 days    Time spent: 40 minutes   Dessa Phi, DO [facility] [email or web address] 12/09/2018, 11:23 AM

## 2018-12-09 NOTE — Progress Notes (Signed)
   12/09/18 1100  Clinical Encounter Type  Visited With Patient  Visit Type Initial;Patient actively dying  Spiritual Encounters  Spiritual Needs Emotional  Stress Factors  Patient Stress Factors Loss of control;Major life changes;Lack of knowledge;Family relationships;Health changes   Met w/ pt.  He expressed lament and is disgruntled that he feels he is not being informed about his health or included in decision-making.  He also states he wants to know whether he will live or die.  He states that he doesn't just want to lie in a bed.  Let pt know that I would convey his concerns to a healthcare provider.  I spoke briefly w/ dr outside of room when he came by.  Dr acknowledged confusion on part of pt at various times.    Myra Gianotti resident, [phone]

## 2018-12-10 DIAGNOSIS — I5021 Acute systolic (congestive) heart failure: Secondary | ICD-10-CM

## 2018-12-10 DIAGNOSIS — I4891 Unspecified atrial fibrillation: Secondary | ICD-10-CM

## 2018-12-10 MED ORDER — HYDROMORPHONE HCL 1 MG/ML IJ SOLN: 1.0000 mg | INTRAMUSCULAR | Status: DC

## 2018-12-10 MED ORDER — HYDROMORPHONE HCL 1 MG/ML IJ SOLN: 0.5000 mg | INTRAMUSCULAR | Status: DC | PRN

## 2018-12-10 MED ORDER — HYDROMORPHONE HCL 1 MG/ML IJ SOLN
1.0000 mg | Freq: Once | INTRAMUSCULAR | Status: AC
  Administered 2018-12-10: 1 mg via INTRAVENOUS
  Filled 2018-12-10: qty 1

## 2018-12-10 MED ORDER — HEPARIN SOD (PORK) LOCK FLUSH 100 UNIT/ML IV SOLN
250.0000 [IU] | INTRAVENOUS | Status: AC | PRN
  Administered 2018-12-10: 250 [IU]

## 2018-12-10 MED ORDER — LORAZEPAM 2 MG/ML IJ SOLN
1.0000 mg | Freq: Once | INTRAMUSCULAR | Status: AC
  Administered 2018-12-10: 1 mg via INTRAVENOUS
  Filled 2018-12-10: qty 1

## 2018-12-10 MED ORDER — SENNA 8.6 MG PO TABS
1.0000 | ORAL_TABLET | Freq: Every day | ORAL | Status: DC
  Administered 2018-12-10: 8.6 mg via ORAL
  Filled 2018-12-10: qty 1

## 2018-12-10 MED ORDER — LORAZEPAM 2 MG/ML IJ SOLN: 1.0000 mg | Freq: Four times a day (QID) | INTRAMUSCULAR | Status: DC | PRN

## 2018-12-10 MED ORDER — BISACODYL 10 MG RE SUPP: 10.0000 mg | Freq: Every day | RECTAL | Status: DC | PRN

## 2018-12-10 NOTE — Progress Notes (Addendum)
Patient will DC to: [facility]  Anticipated DC date: 12/10/18 Family notified: Son Transport by: PTAR 3:30pm   Per MD patient ready for DC to [facility]. RN, patient, patient's family, and facility notified of DC. Discharge Summary and FL2 sent to facility. RN to call report prior to discharge [phone]). DC packet on chart. Ambulance transport requested for patient.   CSW will sign off for now as social work intervention is no longer needed. Please consult [place] again if new needs arise.  Cedric Fishman, LCSW Clinical Social Worker [phone]

## 2018-12-10 NOTE — Progress Notes (Signed)
Discharged to [facility] transported by [employer].

## 2018-12-10 NOTE — Progress Notes (Signed)
CSW received consult regarding residential hospice placement. CSW spoke with liaison for [facility] (son's first choice). They will contact the son today to complete assessment.   Christian Locus Ilianna Bown LCSW [phone]

## 2018-12-10 NOTE — Progress Notes (Signed)
Report given to Cris, Therapist, sports at [facility]

## 2018-12-10 NOTE — Progress Notes (Signed)
PROGRESS NOTE    [name].  QIH:[id number] DOB: 1931/02/05 DOA: 11/26/2018 PCP: Mackie Pai, PA-C     Brief Narrative:  [name]. is a 85 yo male with past medical history for blindness, chronic kidney disease stage IV, A. fib, hypothyroidism, BPH, abdominal aortic aneurysm status post repair who initially presented on 11/27/2018 with complaints of left flank pain and leg pain.  He was admitted for diagnosis of retroperitoneal mass, rule out abscess.  He was determined to have left psoas muscle abscess, has underwent IR evaluation for drain placement on 11/28/2018.  Repeat CT revealed decrease in size of left psoas abscess.  He was also found to have atrial fibrillation with RVR.  Infectious disease, cardiology, critical care consulted due to concern for infected pericardial effusion, without tamponade.  He was transferred to [facility] for possible cardiothoracic surgery consultation.  In discussion with critical care medicine, patient's son, they have decided on conservative management, palliative care consulted.  New events last 24 hours / Subjective: Patient with poor insight into his medical process.  Tells me that he is very confused regarding what is happening.  I explained to him his left psoas abscess, drain placement, IV antibiotics as well as concern for infectious disease of pericardial effusion.  Despite explaining numerous times, patient continued to voice his frustration that he does not understand what is going on.  He asks me if he has cancer, if he is going to die.  Assessment & Plan:   Principal Problem:   Psoas abscess ([place]) Active Problems:   Hypothyroid   CKD (chronic kidney disease), stage III (HCC)   Hypocalcemia   Urinary tract infection associated with indwelling urethral catheter (HCC)   Dehydration   Back pain   Anemia   Protein-calorie malnutrition, severe   Pressure injury of skin   Abnormal serum creatinine level   Pleural effusion  Pericardial effusion   Pseudomonas infection   Goals of care, counseling/discussion   Palliative care by specialist   Left psoas muscle abscess  -Status post left psoas abscess drain placement by by IR on 11/28/2018.  Repeat CT revealed improvement in size of abscess by CT A/P on 1/15  -Blood cultures negative -Wound culture with pseudomonas, sensitive to ceftazidime   Fibrinous pericardial effusion -CCM and cardiology discussed, worrisome for infectious effusion, patient not a great candidate for cardiothoracic evaluation/surgery -Ceftazidime for now   Atrial fibrillation with RVR -Appreciate cardiology -Continue amiodarone. Metoprolol held due to hypotension. Digoxin discontinued  -Rate controlled this morning   Acute systolic heart failure -Lasix on hold for now   Severe malnutrition -Appreciate dietitian  Hypothyroidism -Continue with Synthroid   Chronic kidney disease stage IV -Baseline Cr 2.6-2.8  BPH/ sp TURP 2012, hx of bulbar urethral stricturewith chronic indwelling foley catheter -Chronic foley in place. Continue flomax   Enterococcus UTI, POA, related to catheter -Treated with ampicillin    DVT prophylaxis: SCD Code Status: DNR Family Communication: No family at bedside Disposition Plan: Continue medical care as listed above while inpatient, planning to transfer to residential hospice   Antimicrobials:  Anti-infectives (From admission, onward)   Start     Dose/Rate Route Frequency Ordered Stop   12/07/18 1800  cefTAZidime (FORTAZ) 2 g in sodium chloride 0.9 % 100 mL IVPB     2 g 200 mL/hr over 30 Minutes Intravenous Every 24 hours 12/07/18 1648     11/30/18 1630  ceFEPIme (MAXIPIME) 2 g in sodium chloride 0.9 %  100 mL IVPB  Status:  Discontinued     2 g 200 mL/hr over 30 Minutes Intravenous Daily-1800 11/30/18 1608 12/07/18 1636   11/30/18 1630  ampicillin (OMNIPEN) 1 g in sodium chloride 0.9 % 100 mL IVPB     1 g 300 mL/hr over 20 Minutes  Intravenous 2 times daily 11/30/18 1608 12/03/18 1826   11/29/18 1500  piperacillin-tazobactam (ZOSYN) IVPB 3.375 g  Status:  Discontinued     3.375 g 12.5 mL/hr over 240 Minutes Intravenous Every 8 hours 11/29/18 1415 11/30/18 1608   11/26/18 1800  ampicillin (OMNIPEN) 1 g in sodium chloride 0.9 % 100 mL IVPB  Status:  Discontinued     1 g 300 mL/hr over 20 Minutes Intravenous Every 6 hours 11/26/18 1246 11/29/18 1415   11/26/18 0515  vancomycin (VANCOCIN) IVPB 1000 mg/200 mL premix     1,000 mg 200 mL/hr over 60 Minutes Intravenous  Once 11/26/18 0506 11/26/18 0728   11/26/18 0415  cefTRIAXone (ROCEPHIN) 1 g in sodium chloride 0.9 % 100 mL IVPB     1 g 200 mL/hr over 30 Minutes Intravenous  Once 11/26/18 0410 11/26/18 0457       Objective: Vitals:   12/09/18 0347 12/09/18 1437 12/09/18 2029 12/10/18 0420  BP: 109/81 (!) 101/55 110/62 105/69  Pulse: 78 (!) 58 79 87  Resp: 17 18 16 16   Temp: 97.7 F (36.5 C) 97.8 F (36.6 C) 98 F (36.7 C) 97.6 F (36.4 C)  TempSrc: Oral Axillary Axillary Axillary  SpO2: 100% 97% 100% 97%  Weight:      Height:        Intake/Output Summary (Last 24 hours) at 12/10/2018 1210 Last data filed at 12/10/2018 9211 Gross per 24 hour  Intake 524.74 ml  Output 363 ml  Net 161.74 ml   Filed Weights   11/26/18 0208 12/08/18 1439  Weight: 75.2 kg 71.6 kg    Examination: General exam: Appears comfortable without physical distress Respiratory system: Clear to auscultation. Respiratory effort normal. Cardiovascular system: S1 & S2 heard. No pedal edema. Gastrointestinal system: Abdomen is nondistended, soft and nontender. No organomegaly or masses felt. Normal bowel sounds heard. Central nervous system: Alert, nonfocal Extremities: Symmetric Skin: No rashes, lesions or ulcers exposed skin Psychiatry: Judgement and insight appear very poor   Data Reviewed: I have personally reviewed following labs and imaging studies  CBC: Recent Labs  Lab  12/05/18 1000 12/06/18 0425  WBC 12.5* 12.2*  HGB 8.3* 8.7*  HCT 26.7* 28.3*  MCV 102.7* 102.9*  PLT 149* 941   Basic Metabolic Panel: Recent Labs  Lab 12/04/18 0413 12/05/18 0345 12/06/18 0425 12/07/18 0327 12/08/18 0245  NA 141 139 137 137  --   K 4.1 4.2 4.1 4.2  --   CL 113* 109 107 107  --   CO2 20* 20* 20* 21*  --   GLUCOSE 118* 98 122* 104*  --   BUN 34* 38* 39* 42*  --   CREATININE 2.17* 2.16* 2.17* 2.32*  --   CALCIUM 7.3* 7.9* 8.0* 8.1*  --   MG  --   --   --   --  1.5*   GFR: Estimated Creatinine Clearance: 23.3 mL/min (A) (by C-G formula based on SCr of 2.32 mg/dL (H)). Liver Function Tests: Recent Labs  Lab 12/07/18 0327  AST 10*  ALT 7  ALKPHOS 63  BILITOT 0.6  PROT 4.9*  ALBUMIN 2.0*   No results for input(s): LIPASE, AMYLASE in  the last 168 hours. No results for input(s): AMMONIA in the last 168 hours. Coagulation Profile: No results for input(s): INR, PROTIME in the last 168 hours. Cardiac Enzymes: No results for input(s): CKTOTAL, CKMB, CKMBINDEX, TROPONINI in the last 168 hours. BNP (last 3 results) Recent Labs    08/22/18 1224  PROBNP 371.0*   HbA1C: No results for input(s): HGBA1C in the last 72 hours. CBG: No results for input(s): GLUCAP in the last 168 hours. Lipid Profile: No results for input(s): CHOL, HDL, LDLCALC, TRIG, CHOLHDL, LDLDIRECT in the last 72 hours. Thyroid Function Tests: No results for input(s): TSH, T4TOTAL, FREET4, T3FREE, THYROIDAB in the last 72 hours. Anemia Panel: No results for input(s): VITAMINB12, FOLATE, FERRITIN, TIBC, IRON, RETICCTPCT in the last 72 hours. Sepsis Labs: Recent Labs  Lab 12/05/18 1609  LATICACIDVEN 1.3    Recent Results (from the past 240 hour(s))  MRSA PCR Screening     Status: None   Collection Time: 12/07/18  8:36 PM  Result Value Ref Range Status   MRSA by PCR NEGATIVE NEGATIVE Final    Comment:        The GeneXpert MRSA Assay (FDA approved for NASAL specimens only), is  one component of a comprehensive MRSA colonization surveillance program. It is not intended to diagnose MRSA infection nor to guide or monitor treatment for MRSA infections. Performed at [facility], [place] [address], [place], [place] [address]        Radiology Studies: No results found.    Scheduled Meds: . amiodarone  200 mg Oral Daily  . calcium-vitamin D  2 tablet Oral BID  . Chlorhexidine Gluconate Cloth  6 each Topical Daily  . colchicine  0.6 mg Oral Daily  . feeding supplement (ENSURE ENLIVE)  237 mL Oral BID BM  . ferrous sulfate  325 mg Oral BID WC  .  HYDROmorphone (DILAUDID) injection  0.5 mg Intravenous Q6H  . levothyroxine  88 mcg Oral QAC breakfast  . sodium bicarbonate  650 mg Oral BID  . sodium chloride flush  5 mL Intracatheter Q8H  . tamsulosin  0.4 mg Oral QHS   Continuous Infusions: . sodium chloride 10 mL/hr at 12/09/18 1900  . cefTAZidime (FORTAZ)  IV Stopped (12/09/18 1813)     LOS: 13 days    Time spent: 58minutes   Dessa Phi, DO [facility] [email or web address] 12/10/2018, 12:10 PM

## 2018-12-10 NOTE — Progress Notes (Signed)
Pharmacy Antibiotic Note  [name]. is a 85 y.o. male admitted on 11/26/2018 with flank and leg pain.  Pharmacy has been consulted for Fortaz dosing for Pseudomonas in left psoas abscess.  He is s/p treatment for E.faecalis UTI.  SCr 2.32 (BL SCr ~2), CrCL 23 ml/min, afebrile, WBC 12.2, drain O/P 58mL.   Plan: Continue Fortaz 1gm IV Q24H Pharmacy will sign off as dosage adjustment is likely unnecessary with CKD  Height: 5\' 9"  (175.3 cm) Weight: 157 lb 13.6 oz (71.6 kg) IBW/kg (Calculated) : 70.7  Temp (24hrs), Avg:97.8 F (36.6 C), Min:97.6 F (36.4 C), Max:98 F (36.7 C)  Recent Labs  Lab 12/04/18 0413 12/05/18 0345 12/05/18 1000 12/05/18 1609 12/06/18 0425 12/07/18 0327  WBC  --   --  12.5*  --  12.2*  --   CREATININE 2.17* 2.16*  --   --  2.17* 2.32*  LATICACIDVEN  --   --   --  1.3  --   --     Estimated Creatinine Clearance: 23.3 mL/min (A) (by C-G formula based on SCr of 2.32 mg/dL (H)).    Allergies  Allergen Reactions  . Aspirin Other (See Comments)    Low bp, stomach pains  . Shellfish Allergy     Ampicillin 1/6  >> 1/13 Zosyn 1/9 >> 1/10 Cefepime 1/10 >> 1/17 Tressie Ellis 1/17 >>  1/6 BCx - negative 1/6 UCx: >100K E.faecalis (pan sensitive) 1/8 left psoas abscess - Pseudomonas (S cefepime, Tressie Ellis)  Previous: 12/30 UCx - Enterococcus (pan-sensitive)   Kiarah Eckstein D. Mina Marble, PharmD, BCPS, [place] 12/10/2018, 9:41 AM

## 2018-12-10 NOTE — Discharge Summary (Addendum)
Physician Discharge Summary  [name]. NWG:[id number] DOB: 1931-07-25 DOA: 11/26/2018  PCP: Mackie Pai, PA-C  Admit date: 11/26/2018 Discharge date: 12/10/2018  Disposition:  [facility]   Brief/Interim Summary: Christian Fesler. is a 85 yo male with past medical history for blindness, chronic kidney disease stage IV, A. fib, hypothyroidism, BPH, abdominal aortic aneurysm status post repair who initially presented on 11/27/2018 with complaints of left flank pain and leg pain.  He was admitted for diagnosis of retroperitoneal mass, rule out abscess.  He was determined to have left psoas muscle abscess, has underwent IR evaluation for drain placement on 11/28/2018.  Repeat CT revealed decrease in size of left psoas abscess.  He was also found to have atrial fibrillation with RVR.  Infectious disease, cardiology, critical care consulted due to concern for infected pericardial effusion, without tamponade.  He was transferred to [facility] for possible cardiothoracic surgery consultation.  In discussion with critical care medicine, patient's son, they have decided on conservative management due to high risk nature of cardiothoracic procedure. Palliative care consulted and family has decided to pursue hospice and comfort measures.   Discharge Diagnoses:  Principal Problem:   Psoas abscess ([place]) Active Problems:   Hypothyroid   BPH (benign prostatic hyperplasia)   CKD (chronic kidney disease), stage III (HCC)   Hypocalcemia   Urinary tract infection associated with indwelling urethral catheter (HCC)   Dehydration   Back pain   Anemia   Protein-calorie malnutrition, severe   Pressure injury of skin   Abnormal serum creatinine level   Pleural effusion   Pericardial effusion   Pseudomonas infection   Goals of care, counseling/discussion   Palliative care by specialist   Atrial fibrillation with RVR ([place])   Acute systolic CHF (congestive heart failure) ([place])  Discharge  Instructions   Allergies as of 12/10/2018      Reactions   Aspirin Other (See Comments)   Low bp, stomach pains   Shellfish Allergy       Medication List    STOP taking these medications   calcium-vitamin D 250-100 MG-UNIT tablet   levothyroxine 88 MCG tablet Commonly known as:  SYNTHROID, LEVOTHROID   meropenem 1 g injection Commonly known as:  MERREM   metoprolol tartrate 25 MG tablet Commonly known as:  LOPRESSOR   sodium bicarbonate 650 MG tablet   tamsulosin 0.4 MG Caps capsule Commonly known as:  FLOMAX   Vitamin D (Ergocalciferol) 1.25 MG (50000 UT) Caps capsule Commonly known as:  DRISDOL       Allergies  Allergen Reactions  . Aspirin Other (See Comments)    Low bp, stomach pains  . Shellfish Allergy     Consultations:  Cardiology  Infectious disease  Critical care  Palliative care   Procedures/Studies: Ct Abdomen Pelvis Wo Contrast  Result Date: 12/05/2018 CLINICAL DATA:  Psoas abscess. EXAM: CT ABDOMEN AND PELVIS WITHOUT CONTRAST TECHNIQUE: Multidetector CT imaging of the abdomen and pelvis was performed following the standard protocol without IV contrast. COMPARISON:  11/26/2018 FINDINGS: Lower chest: Moderate bilateral pleural effusions with dependent collapse/consolidation in the lower lobes bilaterally. Hepatobiliary: Left hepatic cyst. Gallbladder nondistended. No intrahepatic or extrahepatic biliary dilation. Pancreas: Diffusely atrophic. Similar appearance of the fluid density structure in the region of the pancreatic head/proximal duodenum. This has been present over multiple prior exams dating back to 09/26/2014 and described as stable back to 2008 on prior studies. This lesion is variably contained gas bubbles on today's study there is  a suggest and may communicate with the gastric antrum or proximal duodenum. While chronic pseudocyst remains a consideration as described on prior studies, distal gastric or duodenal diverticulum might also be  considered. Spleen: No splenomegaly. No focal mass lesion. Adrenals/Urinary Tract: No adrenal nodule or mass. Bilateral renal lesions of varying size and attenuation are identified, most of which are suggestive of cysts. No evidence for hydroureter. Bladder is distended with Foley catheter visualized. Stomach/Bowel: Stomach is distended with food. Duodenum is nondilated. Small bowel loops are minimally distended and fluid-filled. The appendix is normal. Moderate stool volume evident in the colon. Vascular/Lymphatic: Patient is status post abdominal aortic graft surgery. Haziness around the abdominal aorta is similar to prior. There is no gastrohepatic or hepatoduodenal ligament lymphadenopathy. No intraperitoneal or retroperitoneal lymphadenopathy. No pelvic sidewall lymphadenopathy. Reproductive: Not well seen due to streak artifact from left hip replacement. Other: Diffuse body wall edema has progressed in the interval. Musculoskeletal: Left psoas abscess has decreased in the interval measuring 2.1 x 4.2 cm today compared to 6.2 x 7.7 cm previously. The formed loop of the percutaneous drain appears to be in the posterior aspect of the abscess cavity. No worrisome lytic or sclerotic osseous abnormality. IMPRESSION: 1. Interval decrease in size of the left psoas abscess. 2. Interval progression of body wall edema. 3. Interval progression of bilateral pleural effusions now moderate bilaterally with adjacent lower lobe collapse/consolidation. 4. Otherwise stable exam. Electronically Signed   By: Misty Stanley M.D.   On: 12/05/2018 14:20   Ct Abdomen Pelvis Wo Contrast  Result Date: 11/26/2018 CLINICAL DATA:  Left lower quadrant pain radiating down leg since Foley catheter placement on 11/19/2018 EXAM: CT ABDOMEN AND PELVIS WITHOUT CONTRAST TECHNIQUE: Multidetector CT imaging of the abdomen and pelvis was performed following the standard protocol without IV contrast. COMPARISON:  11/19/2018 CT FINDINGS: Lower chest:  Slightly larger dependent posterior pleural effusions bilaterally. The included heart size is top-normal with coronary arteriosclerosis and mitral annular calcifications present. Hepatobiliary: Stable low-density cyst in the medial left hepatic lobe measuring 2.7 cm, unchanged in appearance. No biliary dilatation. Gallbladder is physiologically distended without radiopaque calculi. Pancreas: Pancreatic head cystic mass is redemonstrated likely representing a large pseudocyst from prior pancreatitis given pancreatic gland calcifications in the pancreatic head. This measures approximately 6.9 x 4 9 cm and is stable as well. Unopacified duodenum is seen adjacent to it. The remainder of the pancreatic gland is atrophic. No dilatation or acute inflammation. Spleen: Normal Adrenals/Urinary Tract: Normal bilateral adrenal glands. Simple and complex cysts of both kidneys are redemonstrated without nephrolithiasis nor obstructive uropathy. Retroperitoneal masslike abnormality on the left is redemonstrated with scattered areas of internal fat. Differential considerations remain and iliopsoas liposarcoma, exophytic renal mass arising off the lower pole, less likely adenopathy. The urinary bladder is difficult to visualized due to metallic streak artifacts from the patient's hip arthroplasty. Does appear that the Foley balloon catheter may be deployed partially within the prostatic gland along the prostatic urethra repositioning is suggested. Stomach/Bowel: Small hiatal hernia. Physiologic distention of the stomach and small bowel. Moderate stool retention within the colon. No definite bowel obstruction or inflammation. Vascular/Lymphatic: Status post surgical graft repair of the abdominal aorta without complicating features. No pathologically enlarged lymphadenopathy. Reproductive: Top-normal size prostate Other: No pneumoperitoneum, ascites or focal fluid collection. Musculoskeletal: Moderate thoracolumbar spondylosis. Left  hip arthroplasty without complicating features. IMPRESSION: 1. The urinary bladder is difficult to visualized due to metallic streak artifacts from the patient's hip arthroplasty. It does appear that  the Foley balloon catheter may be deployed partially within the prostatic gland within the expected location of the prostatic urethra and therefore repositioning is suggested. 2. Stable cystic mass in the pancreatic head measuring 6.9 x 4 9 cm likely representing a large pseudocyst from prior pancreatitis. 3. Stable low-density cyst in the medial left hepatic lobe measuring 2.7 cm. 4. Retroperitoneal masslike abnormality on the left with scattered areas of internal macroscopic fat. Differential considerations remain iliopsoas liposarcoma, exophytic renal mass arising off the lower pole, less likely adenopathy among some considerations. Electronically Signed   By: Ashley Royalty M.D.   On: 11/26/2018 04:01   Ct Abdomen Pelvis Wo Contrast  Result Date: 11/19/2018 CLINICAL DATA:  Abdominal pain.  Hypotension. EXAM: CT ABDOMEN AND PELVIS WITHOUT CONTRAST TECHNIQUE: Multidetector CT imaging of the abdomen and pelvis was performed following the standard protocol without IV contrast. COMPARISON:  07/27/2018 CT abdomen/pelvis. FINDINGS: Lower chest: Small dependent bilateral pleural effusions. Nonspecific patchy subpleural reticulation throughout both lung bases. Coronary atherosclerosis. Hepatobiliary: Normal liver size. Simple 2.6 cm left liver lobe cyst. No additional liver lesions. Normal gallbladder with no radiopaque cholelithiasis. No biliary ductal dilatation. Pancreas: There is a cystic 7.1 x 5.9 cm mass centered at the pancreatic head (series 2/image 33), which contains a few tiny internal foci of gas, not substantially changed since 10/22/2015 CT and present since 04/17/2007 CT, suspect a chronic pancreatic pseudocyst. Otherwise diffuse pancreatic parenchymal atrophy with no new pancreatic lesions and no pancreatic  duct dilation, unchanged in the interval. Spleen: Normal size. No mass. Adrenals/Urinary Tract: Normal adrenals. There is a mixed attenuation 6.7 x 4.6 cm medial upper left retroperitoneal mass (series 2/image 29), which abuts the medial upper left kidney, and which is new since 07/27/2018 CT, and which contains scattered internal regions of macroscopic fat density. This mass is contiguous with the upper left psoas muscle. No hydronephrosis. No renal stones. Numerous simple and minimally complex renal cysts in both kidneys, the largest an exophytic 6.8 cm simple renal cyst in posterior lower left kidney. Bladder collapsed by indwelling Foley catheter. Bladder poorly evaluated due to streak artifact from left hip hardware. Stomach/Bowel: Nondistended stomach without definite acute gastric abnormality. Normal caliber small bowel with no small bowel wall thickening. Normal appendix. Normal large bowel with no diverticulosis, large bowel wall thickening or pericolonic fat stranding. Vascular/Lymphatic: Atherosclerotic nonaneurysmal abdominal aorta status post surgical graft repair. No pathologically enlarged lymph nodes in the abdomen or pelvis. Reproductive: Normal size prostate. Other: No pneumoperitoneum, ascites or focal fluid collection. Musculoskeletal: No aggressive appearing focal osseous lesions. Left total hip arthroplasty. Mild-to-moderate thoracolumbar spondylosis. IMPRESSION: 1. Mixed attenuation 6.7 cm medial upper left retroperitoneal mass between the upper left kidney and upper left psoas muscle with areas of internal fat density, new since 07/27/2018 CT and suspicious for malignancy, with the differential including retroperitoneal liposarcoma versus exophytic renal cell carcinoma. MRI abdomen without and with IV contrast may be useful for further characterization. This mass may be amenable to percutaneous biopsy. 2. No evidence of bowel obstruction or acute bowel inflammation. 3. Small dependent  bilateral pleural effusions. 4. Chronic 7.1 cm cystic pancreatic head mass, unchanged since 2016 CT, present since 2008 CT, suspect chronic pancreatic pseudocyst. 5.  Aortic Atherosclerosis (ICD10-I70.0). Electronically Signed   By: Ilona Sorrel M.D.   On: 11/19/2018 20:24   Dg Chest 1 View  Result Date: 12/01/2018 CLINICAL DATA:  PICC line placement EXAM: CHEST  1 VIEW COMPARISON:  11/26/2017 FINDINGS: Right upper extremity catheter tip  projects over the cavoatrial region. Cardiomegaly with development of vascular congestion and diffuse interstitial and hazy pulmonary opacities suspicious for edema. Probable layering bilateral effusions. Aortic atherosclerosis. No pneumothorax. IMPRESSION: 1. Right upper extremity catheter tip projects over the cavoatrial region 2. Cardiomegaly with vascular congestion and moderate pulmonary edema, new since prior. Increased bilateral pleural effusions likely layering. New bibasilar consolidations, atelectasis versus pneumonia Electronically Signed   By: Donavan Foil M.D.   On: 12/01/2018 19:27   Dg Chest 2 View  Result Date: 12/07/2018 CLINICAL DATA:  Pleural effusion EXAM: CHEST - 2 VIEW COMPARISON:  12/01/2018 FINDINGS: Moderate to large layering bilateral effusions. Cardiomegaly with vascular congestion and interstitial prominence, likely interstitial edema. No real change since prior study. Right PICC line is unchanged. IMPRESSION: Cardiomegaly with vascular congestion and pulmonary edema. Large layering bilateral effusions. Electronically Signed   By: Rolm Baptise M.D.   On: 12/07/2018 10:07   Dg Chest 2 View  Result Date: 11/26/2018 CLINICAL DATA:  Left lower quadrant pain EXAM: CHEST - 2 VIEW COMPARISON:  08/22/2018 FINDINGS: Borderline cardiomegaly with tortuous atherosclerotic aorta. Emphysematous hyperinflation of the lungs with faint subpleural areas of fine reticular lung markings that may represent areas of fibrosis. Small posterior pleural effusions have  slightly increased since prior. Degenerative change is noted along the dorsal spine. IMPRESSION: Slight interval increase in posterior small bilateral pleural effusions. Mild fine interstitial lung markings bilaterally that may reflect fibrosis. Electronically Signed   By: Ashley Royalty M.D.   On: 11/26/2018 04:02   Ct Head Wo Contrast  Result Date: 11/26/2018 CLINICAL DATA:  Altered level of consciousness. Anisocoria. EXAM: CT HEAD WITHOUT CONTRAST TECHNIQUE: Contiguous axial images were obtained from the base of the skull through the vertex without intravenous contrast. COMPARISON:  08/07/2013 FINDINGS: Brain: Chronic generalized atrophy. Chronic small-vessel ischemic changes extensively present within the pons. No focal cerebellar insult. Cerebral hemispheres show chronic small-vessel ischemic change of the white matter. No large vessel territory infarction. No mass lesion, hemorrhage, hydrocephalus or extra-axial collection. Vascular: There is atherosclerotic calcification of the major vessels at the base of the brain. Skull: Negative Sinuses/Orbits: Paranasal sinusitis. Orbits negative. Other: None IMPRESSION: No acute finding by CT. Generalized atrophy. Chronic small-vessel ischemic changes, extensive affecting the pons. Inflammatory changes of the paranasal sinuses. Electronically Signed   By: Nelson Chimes M.D.   On: 11/26/2018 06:33   Mr Thoracic Spine Wo Contrast  Result Date: 11/29/2018 CLINICAL DATA:  Left psoas abscess drain today. Back pain radiating to the left leg with numbness and weakness. EXAM: MRI THORACIC AND LUMBAR SPINE WITHOUT CONTRAST TECHNIQUE: Multiplanar and multiecho pulse sequences of the thoracic and lumbar spine were obtained without intravenous contrast. COMPARISON:  Abdominal MRI and CT 11/26/2018. Thoracic and lumbar spine MRI 04/02/2015. FINDINGS: MRI THORACIC SPINE FINDINGS Axial sequences are moderately motion degraded with mild motion on sagittal sequences. Alignment:  Exaggerated thoracic kyphosis.  No significant listhesis. Vertebrae: Diffusely diminished bone marrow T1 signal intensity, nonspecific though may be related to the patient's known anemia. No evidence of acute fracture or discitis. Minimal chronic anterior wedging of multiple midthoracic vertebrae as previously seen. Small Schmorl's nodes. Cord: No definite spinal cord signal abnormality with assessment limited by motion artifact. Paraspinal and other soft tissues: Moderate bilateral pleural effusions. Partial imaging of the superior most aspect of a T2 hyperintense left retroperitoneal collection involving the superior aspect of the psoas muscle, more fully evaluated on the recent abdominal MRI and CT. No epidural abscess identified. Partially visualized renal  cysts, also more fully evaluated on earlier abdominal MRI. Ectatic descending thoracic aorta. Disc levels: Mild multilevel thoracic disc bulging and multiple small disc protrusions are similar to the prior thoracic spine MRI and result in at most mild mass effect on the ventral cord surface. Epidural lipomatosis is again noted throughout the thoracic spine resulting in mild diffuse narrowing of the thoracic thecal sac. MRI LUMBAR SPINE FINDINGS The patient terminated the examination after only a single sagittal T1 sequence was obtained. Vertebral alignment is unchanged from the 2016 MRI with grade 1 anterolisthesis noted of L5 on S1. There is a mild chronic L2 compression fracture. A percutaneous drain is noted in the left psoas collection. Lumbar spondylosis is mild for age though not evaluated in detail on this incomplete, motion degraded study. No significant spinal stenosis is evident. Disc bulging and facet hypertrophy result in mild chronic neural foraminal stenosis at L4-5 and L5-S1. IMPRESSION: 1. Motion degraded, incomplete examination. Only a single lumbar sequence was obtained. 2. Known left psoas abscess status post percutaneous drainage,  incompletely imaged. 3. No gross epidural abscess or diskitis. 4. Chronic thoracic disc degeneration, similar to the 2016 MRI. 5. Incompletely evaluated lumbar spondylosis though without gross progression from 2016. Electronically Signed   By: Logan Bores M.D.   On: 11/29/2018 21:50   Mr Lumbar Spine Wo Contrast  Result Date: 11/29/2018 CLINICAL DATA:  Left psoas abscess drain today. Back pain radiating to the left leg with numbness and weakness. EXAM: MRI THORACIC AND LUMBAR SPINE WITHOUT CONTRAST TECHNIQUE: Multiplanar and multiecho pulse sequences of the thoracic and lumbar spine were obtained without intravenous contrast. COMPARISON:  Abdominal MRI and CT 11/26/2018. Thoracic and lumbar spine MRI 04/02/2015. FINDINGS: MRI THORACIC SPINE FINDINGS Axial sequences are moderately motion degraded with mild motion on sagittal sequences. Alignment: Exaggerated thoracic kyphosis.  No significant listhesis. Vertebrae: Diffusely diminished bone marrow T1 signal intensity, nonspecific though may be related to the patient's known anemia. No evidence of acute fracture or discitis. Minimal chronic anterior wedging of multiple midthoracic vertebrae as previously seen. Small Schmorl's nodes. Cord: No definite spinal cord signal abnormality with assessment limited by motion artifact. Paraspinal and other soft tissues: Moderate bilateral pleural effusions. Partial imaging of the superior most aspect of a T2 hyperintense left retroperitoneal collection involving the superior aspect of the psoas muscle, more fully evaluated on the recent abdominal MRI and CT. No epidural abscess identified. Partially visualized renal cysts, also more fully evaluated on earlier abdominal MRI. Ectatic descending thoracic aorta. Disc levels: Mild multilevel thoracic disc bulging and multiple small disc protrusions are similar to the prior thoracic spine MRI and result in at most mild mass effect on the ventral cord surface. Epidural lipomatosis is  again noted throughout the thoracic spine resulting in mild diffuse narrowing of the thoracic thecal sac. MRI LUMBAR SPINE FINDINGS The patient terminated the examination after only a single sagittal T1 sequence was obtained. Vertebral alignment is unchanged from the 2016 MRI with grade 1 anterolisthesis noted of L5 on S1. There is a mild chronic L2 compression fracture. A percutaneous drain is noted in the left psoas collection. Lumbar spondylosis is mild for age though not evaluated in detail on this incomplete, motion degraded study. No significant spinal stenosis is evident. Disc bulging and facet hypertrophy result in mild chronic neural foraminal stenosis at L4-5 and L5-S1. IMPRESSION: 1. Motion degraded, incomplete examination. Only a single lumbar sequence was obtained. 2. Known left psoas abscess status post percutaneous drainage, incompletely imaged. 3.  No gross epidural abscess or diskitis. 4. Chronic thoracic disc degeneration, similar to the 2016 MRI. 5. Incompletely evaluated lumbar spondylosis though without gross progression from 2016. Electronically Signed   By: Logan Bores M.D.   On: 11/29/2018 21:50   Mr Abdomen Wo Contrast  Result Date: 11/26/2018 CLINICAL DATA:  Renal mass on recent CT EXAM: MRI ABDOMEN WITHOUT CONTRAST TECHNIQUE: Multiplanar multisequence MR imaging was performed without the administration of intravenous contrast. COMPARISON:  CT abdomen/pelvis dated 11/26/2018, 11/19/2018, and 07/27/2018 FINDINGS: Motion degraded images. Lower chest: Small bilateral pleural effusions. Associated lower lobe atelectasis. Hepatobiliary: 2.6 cm cyst in segment 4A (series 6/image 12). Gallbladder is unremarkable. No intrahepatic or extrahepatic ductal dilatation. Pancreas: Chronic pseudocyst in the pancreatic head/uncinate process, measuring 4.5 x 5.6 cm, which directly communicates with the posterior gastric antrum and proximal duodenum (series 6/image 22). Atrophy of the pancreatic body/tail,  chronic. Known parenchymal calcifications in the uncinate process are not evident on MRI. Spleen:  Subcentimeter splenic cyst. Adrenals/Urinary Tract:  Adrenal glands are within normal limits. Bilateral renal cysts, measuring up to 7.2 cm in the lateral left lower kidney (series 3/image 34). Three right renal cysts demonstrate hemorrhage. Medial left upper kidney is displaced by a retroperitoneal mass (described below) involving the left psoas muscle. No hydronephrosis. Stomach/Bowel: Stomach is otherwise within normal limits. Visualized bowel is grossly unremarkable. Vascular/Lymphatic: Postsurgical changes involving the abdominal aorta. No evidence of abdominal aortic aneurysm. No suspicious abdominal lymphadenopathy. Other: 8.1 x 6.0 x 7.0 cm retroperitoneal mass abutting/involving the left psoas muscle (series 10/image 43), new from 07/27/2018. No intrinsic T1 hyperintensity to suggest hemorrhage. Given rapid growth, an infectious/inflammatory etiology is favored, possibly reflecting a psoas abscess. While neoplasm such as sarcoma is technically possible, and can be very aggressive, this degree of rapid progression (without an underlying lesion even in retrospect) would be unexpected. Musculoskeletal: No focal osseous lesions. IMPRESSION: Motion degraded images. Evaluation is also limited due to lack of intravenous contrast administration. 8.1 x 6.0 x 7.0 cm retroperitoneal mass abutting/involving the left psoas muscle, new from September 2019, favoring infectious/inflammatory etiology, possibly reflecting a psoas abscess. Aggressive neoplasm such as retroperitoneal sarcoma is considered less likely given the degree of rapid progression. Additional stable ancillary findings as above. Electronically Signed   By: Julian Hy M.D.   On: 11/26/2018 18:13   US Renal  Result Date: 11/29/2018 CLINICAL DATA:  Abnormal creatinine level. History of renal insufficiency, hypertension, prostatic hyperplasia. EXAM:  RENAL / URINARY TRACT ULTRASOUND COMPLETE COMPARISON:  MRI abdomen 11/26/2018. CT abdomen and pelvis 11/26/2018. FINDINGS: Right Kidney: Renal measurements: 10.6 x 5.1 x 4.5 cm = volume: 127 mL. Diffusely increased parenchymal echotexture suggesting chronic medical renal disease. No hydronephrosis. Multiple simple appearing cysts in the upper and lower poles, largest measuring about 2.2 cm maximal diameter. Greater than 10 cysts are noted. Left Kidney: Renal measurements: 11 x 5.3 x 5 cm = volume: 153 mL. Diffusely increased parenchymal echotexture consistent with chronic medical renal disease. No hydronephrosis. Multiple benign-appearing parenchymal cysts are demonstrated, largest measuring about 6.2 cm maximal diameter. Less than 10 cysts are noted. Bladder: The bladder is decompressed with a Foley catheter in place. Heterogeneous enlargement in the left psoas muscle is demonstrated, with ultrasound measurements of 12.3 x 3.7 x 4.2 cm. This is better demonstrated and characterized on previous CT and MRI examinations. Heterogeneous appearance on ultrasound is suggestive of abscess or hematoma. IMPRESSION: 1. Increased renal echotexture bilaterally consistent with chronic medical renal disease. No hydronephrosis. 2. Multiple  bilateral benign-appearing renal cysts. 3. Foley catheter decompresses the bladder. 4. Left psoas lesion demonstrated, better characterized on previous CT and MRI examinations. See previous reports. Electronically Signed   By: Lucienne Capers M.D.   On: 11/29/2018 19:26   Ct Image Guided Drainage Percut Cath  Peritoneal Retroperit  Result Date: 11/28/2018 INDICATION: Left retroperitoneal psoas abscess EXAM: CT DRAINAGE LEFT PSOAS ABSCESS MEDICATIONS: The patient is currently admitted to the hospital and receiving intravenous antibiotics. The antibiotics were administered within an appropriate time frame prior to the initiation of the procedure. ANESTHESIA/SEDATION: Fentanyl 25 mcg IV; Versed  1.0 mg IV Moderate Sedation Time:  14 MINUTES The patient was continuously monitored during the procedure by the interventional radiology nurse under my direct supervision. COMPLICATIONS: None immediate. PROCEDURE: Informed written consent was obtained from the patient after a thorough discussion of the procedural risks, benefits and alternatives. All questions were addressed. Maximal Sterile Barrier Technique was utilized including caps, mask, sterile gowns, sterile gloves, sterile drape, hand hygiene and skin antiseptic. A timeout was performed prior to the initiation of the procedure. Previous imaging reviewed. Patient position prone. Noncontrast localization CT. The left retroperitoneal abscess was localized and marked for a posterior paraspinous approach. Under sterile conditions and local anesthesia, a 17 gauge 11.8 cm access needle was advanced percutaneously into the left psoas abscess. Syringe aspiration yielded purulent fluid. Sample sent for cytology and culture. Guidewire inserted followed by tract dilatation to insert a pigtail drain. Drain catheter position confirmed within the abscess with CT. Syringe aspiration yielded 30 cc purulent fluid. Catheter connected to external suction bulb. Catheter secured with Prolene suture and a sterile dressing. No immediate complication. Patient tolerated the procedure well. IMPRESSION: Successful CT-guided left psoas abscess 10 French drain placement. Sample sent for cytology and culture. Electronically Signed   By: Jerilynn Mages.  Shick M.D.   On: 11/28/2018 11:08   [place] Ekg Site Rite  Result Date: 11/30/2018 If Site Rite image not attached, placement could not be confirmed due to current cardiac rhythm.   Study Conclusions  - Left ventricle: The cavity size was normal. There was mild   concentric hypertrophy. Systolic function was mildly to   moderately reduced. The estimated ejection fraction was in the   range of 40% to 45%. There is mild hypokinesis of the    basal-midinferior myocardium. The study is not technically   sufficient to allow evaluation of LV diastolic function. - Mitral valve: Calcified annulus. There was mild regurgitation. - Left atrium: The atrium was severely dilated. - Right atrium: The atrium was moderately dilated. - Pericardium, extracardiac: A moderate, free-flowing pericardial   effusion was identified circumferential to the heart. The fluid   exhibited a fibrinous appearance.There was no evidence of   hemodynamic compromise.  Impressions:  - Pericardial effusion. Findings exclude clinically significant tamponade.   Discharge Exam: Vitals:   12/09/18 2029 12/10/18 0420  BP: 110/62 105/69  Pulse: 79 87  Resp: 16 16  Temp: 98 F (36.7 C) 97.6 F (36.4 C)  SpO2: 100% 97%    General exam: Appears comfortable without physical distress Respiratory system: Clear to auscultation. Respiratory effort normal. Cardiovascular system: S1 & S2 heard. No pedal edema. Gastrointestinal system: Abdomen is nondistended, soft and nontender. No organomegaly or masses felt. Normal bowel sounds heard. Central nervous system: Alert, nonfocal Extremities: Symmetric Skin: No rashes, lesions or ulcers exposed skin Psychiatry: Judgement and insight appear very poor   The results of significant diagnostics from this hospitalization (including imaging, microbiology, ancillary  and laboratory) are listed below for reference.     Microbiology: Recent Results (from the past 240 hour(s))  MRSA PCR Screening     Status: None   Collection Time: 12/07/18  8:36 PM  Result Value Ref Range Status   MRSA by PCR NEGATIVE NEGATIVE Final    Comment:        The GeneXpert MRSA Assay (FDA approved for NASAL specimens only), is one component of a comprehensive MRSA colonization surveillance program. It is not intended to diagnose MRSA infection nor to guide or monitor treatment for MRSA infections. Performed at [facility],  [place] [address], [place], [place] [address]      Labs: BNP (last 3 results) No results for input(s): BNP in the last 8760 hours. Basic Metabolic Panel: Recent Labs  Lab 12/04/18 0413 12/05/18 0345 12/06/18 0425 12/07/18 0327 12/08/18 0245  NA 141 139 137 137  --   K 4.1 4.2 4.1 4.2  --   CL 113* 109 107 107  --   CO2 20* 20* 20* 21*  --   GLUCOSE 118* 98 122* 104*  --   BUN 34* 38* 39* 42*  --   CREATININE 2.17* 2.16* 2.17* 2.32*  --   CALCIUM 7.3* 7.9* 8.0* 8.1*  --   MG  --   --   --   --  1.5*   Liver Function Tests: Recent Labs  Lab 12/07/18 0327  AST 10*  ALT 7  ALKPHOS 63  BILITOT 0.6  PROT 4.9*  ALBUMIN 2.0*   No results for input(s): LIPASE, AMYLASE in the last 168 hours. No results for input(s): AMMONIA in the last 168 hours. CBC: Recent Labs  Lab 12/05/18 1000 12/06/18 0425  WBC 12.5* 12.2*  HGB 8.3* 8.7*  HCT 26.7* 28.3*  MCV 102.7* 102.9*  PLT 149* 161   Cardiac Enzymes: No results for input(s): CKTOTAL, CKMB, CKMBINDEX, TROPONINI in the last 168 hours. BNP: Invalid input(s): POCBNP CBG: No results for input(s): GLUCAP in the last 168 hours. D-Dimer No results for input(s): DDIMER in the last 72 hours. Hgb A1c No results for input(s): HGBA1C in the last 72 hours. Lipid Profile No results for input(s): CHOL, HDL, LDLCALC, TRIG, CHOLHDL, LDLDIRECT in the last 72 hours. Thyroid function studies No results for input(s): TSH, T4TOTAL, T3FREE, THYROIDAB in the last 72 hours.  Invalid input(s): FREET3 Anemia work up No results for input(s): VITAMINB12, FOLATE, FERRITIN, TIBC, IRON, RETICCTPCT in the last 72 hours. Urinalysis    Component Value Date/Time   COLORURINE YELLOW 11/26/2018 0342   APPEARANCEUR CLOUDY (A) 11/26/2018 0342   LABSPEC 1.020 11/26/2018 0342   PHURINE 5.5 11/26/2018 0342   GLUCOSEU NEGATIVE 11/26/2018 0342   HGBUR SMALL (A) 11/26/2018 0342   BILIRUBINUR NEGATIVE 11/26/2018 0342   BILIRUBINUR N 11/06/2014 1058   KETONESUR  NEGATIVE 11/26/2018 0342   PROTEINUR 30 (A) 11/26/2018 0342   UROBILINOGEN 0.2 08/18/2015 0451   NITRITE NEGATIVE 11/26/2018 0342   LEUKOCYTESUR TRACE (A) 11/26/2018 0342   Sepsis Labs Invalid input(s): PROCALCITONIN,  WBC,  LACTICIDVEN Microbiology Recent Results (from the past 240 hour(s))  MRSA PCR Screening     Status: None   Collection Time: 12/07/18  8:36 PM  Result Value Ref Range Status   MRSA by PCR NEGATIVE NEGATIVE Final    Comment:        The GeneXpert MRSA Assay (FDA approved for NASAL specimens only), is one component of a comprehensive MRSA colonization surveillance program.  It is not intended to diagnose MRSA infection nor to guide or monitor treatment for MRSA infections. Performed at [facility], [place] [address], [place], [place] [address]      Patient was seen and examined on the day of discharge and was found to be in stable condition. Time coordinating discharge: 35 minutes including assessment and coordination of care, as well as examination of the patient.   SIGNED:  Dessa Phi, DO [facility] [email or web address] 12/10/2018, 2:46 PM

## 2018-12-10 NOTE — Progress Notes (Signed)
[facility] able to accept patient today. CSW will begin working on discharge.   Percell Locus Adenike Shidler LCSW [phone]

## 2018-12-10 NOTE — Progress Notes (Signed)
Palliative Medicine RN Note: Met with patient and his son & granddaughter; discussed with Dr Hilma Favors. Daily symptom check. [facility] liaison on the way to meet with family around 51.  Patient is awake. He is oriented but gets confused easily. He is using prn doses of hydromorphone on top of his scheduled doses. He reports problems with constipation. I obtained orders for Dilaudid adjustment, addition of Ativan, and a bowel regimen from Dr Hilma Favors.  Mr Carreiro did not initially remember the discussion about hospice yesterday, but upon prompting, he recalled the visit from our NP Romona Curls. He did not remember discussing hospice, and her verbalized fear that hospice was "for people who are already dead but still existing." His son reminded him that people improve and go home from hospice homes, and he seemed to recall that.   He reports that the most important goal for him is to be able to get to the toilet. As long as he is safe and able, this is fine, but I did explain that we can't get him up to walk if it isn't safe. He and his family verbalized understanding.  Overall, Mr Tumbleson seems very overwhelmed and fearful. He expressed repeatedly that he knows he is "a burden" on his son, and both his son and I refuted this. Mr Nazareno feels like he doesn't matter to anyone, and that he only matters to the staff because we wouldn't have a job without him. When told that he matters because he is a person, he reports that was the first time anyone had said something like that to him. This suffering seems to be heightened by his visual deficits. I have paged the chaplain to ask for another visit & wait for her call back.   Hopefully Mr Archuletta will be able to go to [facility] sooner rather than later so his symptoms can be managed and he can be wheeled outside one more time (one of his son's stated goals).  Marjie Skiff Avannah Decker, RN, BSN, [facility] Palliative Medicine Team 12/10/2018 2:22 PM Office [phone]

## 2018-12-10 NOTE — Progress Notes (Signed)
[facility] Met with family son Lennette Bihari and Daughter from [place]. Discussed hospice services and philosophy. The y are all in agreement with this. Dr. Ander Purpura approved pt to come [facility] and son signed paperwork. Percell Locus will arrange ambulance services once pt discharged. Webb Silversmith RN [phone]

## 2018-12-10 NOTE — Consult Note (Addendum)
Ref: Christian Pai, PA-C   Subjective:  Denies chest pain or shortness of breath. VS stable with monitor showing atrial fibrillation.   Objective:  Vital Signs in the last 24 hours: Temp:  [97.6 F (36.4 C)-98 F (36.7 C)] 97.6 F (36.4 C) (01/20 0420) Pulse Rate:  [58-87] 87 (01/20 0420) Cardiac Rhythm: Atrial fibrillation (01/20 0701) Resp:  [16-18] 16 (01/20 0420) BP: (101-110)/(55-69) 105/69 (01/20 0420) SpO2:  [97 %-100 %] 97 % (01/20 0420)  Physical Exam: BP Readings from Last 1 Encounters:  12/10/18 105/69     Wt Readings from Last 1 Encounters:  12/08/18 71.6 kg    Weight change:  Body mass index is 23.31 kg/m. HEENT: [place]/AT, Eyes-Blue, PERL, EOMI, Conjunctiva-Pink, Sclera-Non-icteric Neck: No JVD, No bruit, Trachea midline. Lungs:  Clear, Bilateral. Cardiac: Irregular rhythm, normal S1 and S2, no S3. II/VI systolic murmur. Abdomen:  Soft, non-tender. BS present. Extremities:  No edema present. No cyanosis. No clubbing. Bilateral lower leg wounds with foam dressing. CNS: AxOx3, Cranial nerves grossly intact, moves all 4 extremities.  Skin: Warm and dry.   Intake/Output from previous day: 01/19 0701 - 01/20 0700 In: 765.7 [P.O.:416; I.V.:234.7; IV Piggyback:100] Out: 363 [Urine:335; Drains:25; Stool:3]    Lab Results: BMET    Component Value Date/Time   NA 137 12/07/2018 0327   NA 137 12/06/2018 0425   NA 139 12/05/2018 0345   K 4.2 12/07/2018 0327   K 4.1 12/06/2018 0425   K 4.2 12/05/2018 0345   CL 107 12/07/2018 0327   CL 107 12/06/2018 0425   CL 109 12/05/2018 0345   CO2 21 (L) 12/07/2018 0327   CO2 20 (L) 12/06/2018 0425   CO2 20 (L) 12/05/2018 0345   GLUCOSE 104 (H) 12/07/2018 0327   GLUCOSE 122 (H) 12/06/2018 0425   GLUCOSE 98 12/05/2018 0345   BUN 42 (H) 12/07/2018 0327   BUN 39 (H) 12/06/2018 0425   BUN 38 (H) 12/05/2018 0345   CREATININE 2.32 (H) 12/07/2018 0327   CREATININE 2.17 (H) 12/06/2018 0425   CREATININE 2.16 (H) 12/05/2018  0345   CREATININE 2.03 (H) 01/01/2015 1052   CREATININE 1.55 (H) 11/06/2014 1055   CREATININE 1.89 (H) 09/25/2014 1159   CALCIUM 8.1 (L) 12/07/2018 0327   CALCIUM 8.0 (L) 12/06/2018 0425   CALCIUM 7.9 (L) 12/05/2018 0345   CALCIUM 5.1 11/27/2018 0558   CALCIUM 6.1 (LL) 12/06/2012 1430   GFRNONAA 25 (L) 12/07/2018 0327   GFRNONAA 27 (L) 12/06/2018 0425   GFRNONAA 27 (L) 12/05/2018 0345   GFRAA 29 (L) 12/07/2018 0327   GFRAA 31 (L) 12/06/2018 0425   GFRAA 31 (L) 12/05/2018 0345   CBC    Component Value Date/Time   WBC 12.2 (H) 12/06/2018 0425   RBC 2.68 (L) 12/06/2018 1458   RBC 2.75 (L) 12/06/2018 0425   HGB 8.7 (L) 12/06/2018 0425   HCT 28.3 (L) 12/06/2018 0425   PLT 161 12/06/2018 0425   MCV 102.9 (H) 12/06/2018 0425   MCH 31.6 12/06/2018 0425   MCHC 30.7 12/06/2018 0425   RDW 18.9 (H) 12/06/2018 0425   LYMPHSABS 0.9 12/02/2018 0317   MONOABS 0.9 12/02/2018 0317   EOSABS 0.1 12/02/2018 0317   BASOSABS 0.0 12/02/2018 0317   HEPATIC Function Panel Recent Labs    11/26/18 0216 11/29/18 0612 12/07/18 0327  PROT 6.1* 4.9* 4.9*   HEMOGLOBIN A1C No components found for: HGA1C,  MPG CARDIAC ENZYMES Lab Results  Component Value Date   CKTOTAL 246 11/26/2018  CKMB 2.3 06/08/2010   TROPONINI <0.30 09/27/2014   TROPONINI <0.30 09/27/2014   TROPONINI <0.30 09/26/2014   BNP Recent Labs    08/22/18 1224  PROBNP 371.0*   TSH Recent Labs    12/06/18 1458  TSH 8.780*   CHOLESTEROL No results for input(s): CHOL in the last 8760 hours.  Scheduled Meds: . amiodarone  200 mg Oral Daily  . calcium-vitamin D  2 tablet Oral BID  . Chlorhexidine Gluconate Cloth  6 each Topical Daily  . colchicine  0.6 mg Oral Daily  . feeding supplement (ENSURE ENLIVE)  237 mL Oral BID BM  . ferrous sulfate  325 mg Oral BID WC  .  HYDROmorphone (DILAUDID) injection  0.5 mg Intravenous Q6H  . levothyroxine  88 mcg Oral QAC breakfast  . sodium bicarbonate  650 mg Oral BID  . sodium  chloride flush  5 mL Intracatheter Q8H  . tamsulosin  0.4 mg Oral QHS   Continuous Infusions: . sodium chloride 10 mL/hr at 12/09/18 1900  . cefTAZidime (FORTAZ)  IV Stopped (12/09/18 1813)   PRN Meds:.sodium chloride, acetaminophen **OR** acetaminophen, diphenhydrAMINE-zinc acetate, HYDROmorphone (DILAUDID) injection, liver oil-zinc oxide, ondansetron **OR** ondansetron (ZOFRAN) IV, sodium chloride flush  Assessment/Plan: Atrial fibrillation with controlled ventricular response Hypotension and sepsis from pseudomonas infection Left Psoas abscess  Chronic iron deficiency anemia CKD, IV Bilateral pleuropericardial effusion UTI with indwelling catheter                                                                                                    PVD  Continue medications. Hold anticoagulation.   LOS: 13 days    Dixie Dials  MD  12/10/2018, 10:58 AM

## 2018-12-22 DEATH — deceased
